# Patient Record
Sex: Male | Born: 1960 | Race: White | Hispanic: No | Marital: Single | Smoking: Never smoker
Health system: Southern US, Community
[De-identification: ages and names within clinical notes are randomized; demographics above are authoritative.]

## PROBLEM LIST (undated history)

## (undated) DIAGNOSIS — R569 Unspecified convulsions: Secondary | ICD-10-CM

## (undated) DIAGNOSIS — I10 Essential (primary) hypertension: Secondary | ICD-10-CM

## (undated) DIAGNOSIS — F849 Pervasive developmental disorder, unspecified: Secondary | ICD-10-CM

## (undated) DIAGNOSIS — D472 Monoclonal gammopathy: Secondary | ICD-10-CM

## (undated) DIAGNOSIS — I495 Sick sinus syndrome: Secondary | ICD-10-CM

## (undated) DIAGNOSIS — F32A Depression, unspecified: Secondary | ICD-10-CM

## (undated) DIAGNOSIS — F329 Major depressive disorder, single episode, unspecified: Secondary | ICD-10-CM

## (undated) HISTORY — PX: PACEMAKER IMPLANT: EP1218

---
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
## 2010-02-25 DIAGNOSIS — T883XXA Malignant hyperthermia due to anesthesia, initial encounter: Secondary | ICD-10-CM | POA: Insufficient documentation

## 2010-02-25 DIAGNOSIS — E6609 Other obesity due to excess calories: Secondary | ICD-10-CM | POA: Insufficient documentation

## 2010-03-07 DIAGNOSIS — F429 Obsessive-compulsive disorder, unspecified: Secondary | ICD-10-CM | POA: Insufficient documentation

## 2010-03-21 DIAGNOSIS — F849 Pervasive developmental disorder, unspecified: Secondary | ICD-10-CM | POA: Insufficient documentation

## 2010-03-21 DIAGNOSIS — F70 Mild intellectual disabilities: Secondary | ICD-10-CM | POA: Insufficient documentation

## 2010-03-21 DIAGNOSIS — F323 Major depressive disorder, single episode, severe with psychotic features: Secondary | ICD-10-CM | POA: Insufficient documentation

## 2010-05-27 DIAGNOSIS — L570 Actinic keratosis: Secondary | ICD-10-CM | POA: Insufficient documentation

## 2012-09-10 ENCOUNTER — Emergency Department: Payer: Self-pay | Admitting: Emergency Medicine

## 2012-09-20 ENCOUNTER — Emergency Department: Payer: Self-pay | Admitting: Emergency Medicine

## 2013-04-17 DIAGNOSIS — R269 Unspecified abnormalities of gait and mobility: Secondary | ICD-10-CM | POA: Insufficient documentation

## 2013-04-17 DIAGNOSIS — G252 Other specified forms of tremor: Secondary | ICD-10-CM | POA: Insufficient documentation

## 2013-04-17 DIAGNOSIS — K649 Unspecified hemorrhoids: Secondary | ICD-10-CM | POA: Insufficient documentation

## 2013-05-09 DIAGNOSIS — F980 Enuresis not due to a substance or known physiological condition: Secondary | ICD-10-CM | POA: Insufficient documentation

## 2013-07-09 DIAGNOSIS — K635 Polyp of colon: Secondary | ICD-10-CM | POA: Insufficient documentation

## 2013-07-22 DIAGNOSIS — Z85828 Personal history of other malignant neoplasm of skin: Secondary | ICD-10-CM | POA: Insufficient documentation

## 2013-10-24 ENCOUNTER — Observation Stay: Payer: Self-pay | Admitting: Internal Medicine

## 2013-10-24 LAB — CBC
HCT: 34.3 % — AB (ref 40.0–52.0)
HGB: 12 g/dL — ABNORMAL LOW (ref 13.0–18.0)
MCH: 33.8 pg (ref 26.0–34.0)
MCHC: 35 g/dL (ref 32.0–36.0)
MCV: 97 fL (ref 80–100)
PLATELETS: 118 10*3/uL — AB (ref 150–440)
RBC: 3.55 10*6/uL — ABNORMAL LOW (ref 4.40–5.90)
RDW: 14.4 % (ref 11.5–14.5)
WBC: 12.6 10*3/uL — AB (ref 3.8–10.6)

## 2013-10-24 LAB — PROTIME-INR
INR: 1
Prothrombin Time: 13.3 secs (ref 11.5–14.7)

## 2013-10-24 LAB — URINALYSIS, COMPLETE
Bacteria: NONE SEEN
Bilirubin,UR: NEGATIVE
Blood: NEGATIVE
Glucose,UR: 150 mg/dL (ref 0–75)
KETONE: NEGATIVE
Leukocyte Esterase: NEGATIVE
Nitrite: NEGATIVE
PH: 7 (ref 4.5–8.0)
Protein: NEGATIVE
SQUAMOUS EPITHELIAL: NONE SEEN
Specific Gravity: 1.006 (ref 1.003–1.030)

## 2013-10-24 LAB — COMPREHENSIVE METABOLIC PANEL
ALT: 13 U/L — AB
AST: 20 U/L (ref 15–37)
Albumin: 3.2 g/dL — ABNORMAL LOW (ref 3.4–5.0)
Alkaline Phosphatase: 97 U/L
Anion Gap: 11 (ref 7–16)
BUN: 16 mg/dL (ref 7–18)
Bilirubin,Total: 0.4 mg/dL (ref 0.2–1.0)
CHLORIDE: 87 mmol/L — AB (ref 98–107)
CO2: 25 mmol/L (ref 21–32)
Calcium, Total: 9.1 mg/dL (ref 8.5–10.1)
Creatinine: 0.7 mg/dL (ref 0.60–1.30)
EGFR (Non-African Amer.): >60
GLUCOSE: 84 mg/dL (ref 65–99)
OSMOLALITY: 248 (ref 275–301)
POTASSIUM: 3.9 mmol/L (ref 3.5–5.1)
SODIUM: 123 mmol/L — AB (ref 136–145)
Total Protein: 6.9 g/dL (ref 6.4–8.2)

## 2013-10-25 LAB — BASIC METABOLIC PANEL
ANION GAP: 10 (ref 7–16)
BUN: 17 mg/dL (ref 7–18)
CALCIUM: 8.6 mg/dL (ref 8.5–10.1)
Chloride: 102 mmol/L (ref 98–107)
Co2: 26 mmol/L (ref 21–32)
Creatinine: 0.63 mg/dL (ref 0.60–1.30)
EGFR (African American): >60
EGFR (Non-African Amer.): >60
GLUCOSE: 72 mg/dL (ref 65–99)
OSMOLALITY: 276 (ref 275–301)
Potassium: 3.9 mmol/L (ref 3.5–5.1)
Sodium: 138 mmol/L (ref 136–145)

## 2013-10-25 LAB — OSMOLALITY, URINE: Osmolality: 196 mOsm/kg

## 2013-10-25 LAB — CREATININE, URINE, RANDOM: Creatinine, Urine Random: 22.7 mg/dL — ABNORMAL LOW (ref 30.0–125.0)

## 2013-10-25 LAB — SODIUM, URINE, RANDOM: SODIUM, URINE RANDOM: 65 mmol/L (ref 20–110)

## 2014-04-24 DIAGNOSIS — Z5181 Encounter for therapeutic drug level monitoring: Secondary | ICD-10-CM | POA: Insufficient documentation

## 2014-04-24 DIAGNOSIS — R251 Tremor, unspecified: Secondary | ICD-10-CM | POA: Insufficient documentation

## 2014-04-24 DIAGNOSIS — R2681 Unsteadiness on feet: Secondary | ICD-10-CM | POA: Insufficient documentation

## 2014-05-30 NOTE — H&P (Signed)
PATIENT NAME:  Frank Conley, Frank Conley MR#:  [id number] DATE OF BIRTH:  1960-09-11  DATE OF ADMISSION:  10/24/2013  CHIEF COMPLAINT: Fall and weakness.   HISTORY OF PRESENT ILLNESS:  This 54 year old Caucasian male patient with history of seizures, OCD, questionable Parkinson's, and schizoaffective disorder presents to the Emergency Room, brought in by his group home caretaker after initially patient fell yesterday.  Today he got diaphoretic and almost passed out and was brought to the Emergency Room.  The patient is a poor historian and unable to give me much history but states that he does not have any pain and he does not remember when or how he fell.  But, per history, the patient did not have any syncope.  He did not hit his head.  CT of the head has been done today and is normal.   History has been obtained from my conversation with his brother who is his guardian, ER staff and records.   His brother mentions that the patient has had gait abnormalities and some weakness over the past 2 months and has had increasing physical therapy.  He follows with neurology in [facility], although schizoaffective disorder and Parkinson's disease were suspected, brother states that these are not possible.   PAST MEDICAL HISTORY:  1.  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  2.  Hyponatremia of unknown baseline.  3.  Seizures.  4.  History of syncope.  5.  OCD.  6.  Questionable schizoaffective disorder and Parkinson's disease.  7.  Dementia.   FAMILY HISTORY: Parkinson's disease.   SOCIAL HISTORY: The patient does not smoke. No alcohol. No illicit drugs. Lives at a group home.   ALLERGIES: No known drug allergies.   REVIEW OF SYSTEMS:  Unobtainable from patient secondary to his baseline confusion.   HOME MEDICATIONS:  1.  Amantadine 100 mg oral 3 times a day. 2.  Claritin 10 mg oral once a day. 3.  Depakote 500 mg oral once a day.  4.   Depakote 500 mg 2 tablets once a day at bedtime and 1 tab in AM 5.   Ferrous sulfate 325 mg daily.  6.  Klonopin 1 mg oral 2 times a day 7:00 a.m. and 2:00 p.m.  7.  Melatonin 3 mg oral once a day.  8.  MiraLax 1 dose daily.  9.  NAC 600 mg 2 capsules once a day in the morning and 1 capsule at night.  10. Norvasc 5 mg daily.  11. Omega-3 fatty acids 1 capsule daily.  12. Sertraline 100 mg 2.5 tablets daily.   PHYSICAL EXAMINATION:  VITAL SIGNS: Temperature of 98, pulse 72, blood pressure 130/83, saturating 100% on room air.  GENERAL: Moderately built Caucasian male patient lying in bed, seems comfortable.   PSYCHIATRIC: Alert, awake, has a flat affect.  HEENT: Atraumatic, normocephalic. Oral mucosa moist and pink. External ears normal. Pallor positive. No icterus. Pupils are equal and reactive to light.  NECK: Supple. No thyromegaly or palpable lymph nodes. Trachea midline. No carotid bruit, JVD.  CARDIOVASCULAR: S1, S2, without any murmurs. Peripheral pulses 2+. No edema.  RESPIRATORY: Normal work of breathing. Clear to auscultation on both sides.  GASTROINTESTINAL: Soft abdomen, nontender. Bowel sounds present. No organomegaly palpable.  SKIN: Warm and dry. No petechiae, rash, ulcers found.  MUSCULOSKELETAL: No joint swelling, redness in large joints.  Muscle tone is increased with cogwheel rigidity.  NEUROLOGICAL: Motor strength is significantly decreased in his lower extremities. Normal in upper extremities. Has hyperreflexia.  LABORATORY DATA: Glucose of 84, BUN 16, creatinine 0.7, with sodium 123, potassium 3.9, chloride 87, bicarbonate 25, GFR greater than 60. AST, ALT, alkaline phosphatase, bilirubin normal.   WBC 12.8, hemoglobin 12, platelets of 118,000 with INR of 1.   CT scan of the head without contrast shows nothing acute.   ASSESSMENT AND PLAN:  1.  Recurrent falls in a patient with possible Parkinson's disease and prior history of syncope.  I suspect the patient has had worsening disease, although he does not have a diagnosis of  Parkinson's.  He has some cogwheel rigidity, but does not have the classic pill-rolling tremor and gait not tested at this point. We will consult neurology for further recommendations regarding this, along with his seizure medications as the Depakote could be causing his hyponatremia and changing this medication to a different antiseizure medication seems to be appropriate with his significantly low sodium levels at this point.  The patient does seem to have chronic hyponatremia per his family, although his baseline numbers are unknown.  2.  Obsessive compulsive disorder. Continue medications.  3.  Hypertension, although not mentioned in his history, the patient is on Norvasc.  We will continue this and monitor closely.  4.  Deep vein thrombosis prophylaxis with Lovenox.   CODE STATUS: FULL CODE.   TIME SPENT TODAY ON THIS CASE:  40 minutes.     ____________________________ Leia Alf Ayano Douthitt, MD srs:DT D: 10/24/2013 15:24:15 ET T: 10/24/2013 15:50:42 ET JOB#: [id number]  cc: Alveta Heimlich R. Abigail Teall, MD, <Dictator> Neita Carp MD ELECTRONICALLY SIGNED 10/29/2013 15:59

## 2014-05-30 NOTE — Discharge Summary (Signed)
Dates of Admission and Diagnosis:  Date of Admission 24-Oct-2013   Date of Discharge 26-Oct-2013   Admitting Diagnosis hyponatremia   Final Diagnosis recurrent falls, Hyponatremia Htn Seizure disorder    Chief Complaint/History of Present Illness 54 year old Caucasian male patient with history of seizures, OCD, questionable Parkinson's, and schizoaffective disorder presents to the Emergency Room, brought in by his group home caretaker after initially patient fell yesterday.  Today he got diaphoretic and almost passed out and was brought to the Emergency Room.  The patient is a poor historian and unable to give me much history but states that he does not have any pain and he does not remember when or how he fell.  But, per history, the patient did not have any syncope.  He did not hit his head.  CT of the head has been done today and is normal.   History has been obtained from my conversation with his brother who is his guardian, ER staff and records.   His brother mentions that the patient has had gait abnormalities and some weakness over the past 2 months and has had increasing physical therapy.  He follows with neurology in [facility], although schizoaffective disorder and Parkinson's disease were suspected, brother states that these are not possible.   Allergies:  No Known Allergies:   Hepatic:  18-Sep-15 13:25   Bilirubin, Total 0.4  Alkaline Phosphatase 97 (46-116 NOTE: New Reference Range 08/26/13)  SGPT (ALT)  13 (14-63 NOTE: New Reference Range 08/26/13)  SGOT (AST) 20  Total Protein, Serum 6.9  Albumin, Serum  3.2  Routine Chem:  18-Sep-15 13:25   Glucose, Serum 84  BUN 16  Creatinine (comp) 0.70  Sodium, Serum  123  Potassium, Serum 3.9  Chloride, Serum  87  CO2, Serum 25  Calcium (Total), Serum 9.1  Anion Gap 11  Osmolality (calc) 248  eGFR (African American) >60  eGFR (Non-African American) >60 (eGFR values <20mL/min/1.73 m2 may be an indication of  chronic kidney disease (CKD). Calculated eGFR is useful in patients with stable renal function. The eGFR calculation will not be reliable in acutely ill patients when serum creatinine is changing rapidly. It is not useful in  patients on dialysis. The eGFR calculation may not be applicable to patients at the low and high extremes of body sizes, pregnant women, and vegetarians.)  19-Sep-15 04:12   Glucose, Serum 72  BUN 17  Creatinine (comp) 0.63  Sodium, Serum 138  Potassium, Serum 3.9  Chloride, Serum 102  CO2, Serum 26  Calcium (Total), Serum 8.6  Anion Gap 10  Osmolality (calc) 276  eGFR (African American) >60  eGFR (Non-African American) >60 (eGFR values <75mL/min/1.73 m2 may be an indication of chronic kidney disease (CKD). Calculated eGFR is useful in patients with stable renal function. The eGFR calculation will not be reliable in acutely ill patients when serum creatinine is changing rapidly. It is not useful in  patients on dialysis. The eGFR calculation may not be applicable to patients at the low and high extremes of body sizes, pregnant women, and vegetarians.)  Misc Urine Chem:  18-Sep-15 20:06   Sodium, Urine Random 65 (Result(s) reported on 24 Oct 2013 at 11:58PM.)  Creatinine, Urine Random  22.7 (Result(s) reported on 25 Oct 2013 at 01:18AM.)  Osmolality, Urine Random 196 (50-1400 300-900 mOsm/kg   with avg Fluid Intake > 850 mOsm/kg  with Fluid Restriction)  Routine UA:  18-Sep-15 20:06   Color (UA) Straw  Clarity (UA) Clear  Glucose (  UA) 150 mg/dL  Bilirubin (UA) Negative  Ketones (UA) Negative  Specific Gravity (UA) 1.006  Blood (UA) Negative  pH (UA) 7.0  Protein (UA) Negative  Nitrite (UA) Negative  Leukocyte Esterase (UA) Negative (Result(s) reported on 24 Oct 2013 at 11:58PM.)  RBC (UA) <1 /HPF  WBC (UA) <1 /HPF  Bacteria (UA) NONE SEEN  Epithelial Cells (UA) NONE SEEN  Mucous (UA) PRESENT (Result(s) reported on 24 Oct 2013 at 11:58PM.)   Routine Coag:  18-Sep-15 13:25   Prothrombin 13.3  INR 1.0 (INR reference interval applies to patients on anticoagulant therapy. A single INR therapeutic range for coumarins is not optimal for all indications; however, the suggested range for most indications is 2.0 - 3.0. Exceptions to the INR Reference Range may include: Prosthetic heart valves, acute myocardial infarction, prevention of myocardial infarction, and combinations of aspirin and anticoagulant. The need for a higher or lower target INR must be assessed individually. Reference: The Pharmacology and Management of the Vitamin K  antagonists: the seventh ACCP Conference on Antithrombotic and Thrombolytic Therapy. JKDTO.6712 Sept:126 (3suppl): [id number]. A HCT value >55% may artifactually increase the PT.  In one study,  the increase was an average of 25%. Reference:  "Effect on Routine and Special Coagulation Testing Values of Citrate Anticoagulant Adjustment in Patients with High HCT Values." American Journal of Clinical Pathology 2006;126:400-405.)  Routine Hem:  18-Sep-15 13:25   WBC (CBC)  12.6  RBC (CBC)  3.55  Hemoglobin (CBC)  12.0  Hematocrit (CBC)  34.3  Platelet Count (CBC)  118 (Result(s) reported on 24 Oct 2013 at 01:57PM.)  MCV 97  MCH 33.8  MCHC 35.0  RDW 14.4   PERTINENT RADIOLOGY STUDIES: CT:    18-Sep-15 12:45, CT Head Without Contrast  CT Head Without Contrast   REASON FOR EXAM:    h a after imjury  COMMENTS:       PROCEDURE: CT  - CT HEAD WITHOUT CONTRAST  - Oct 24 2013 12:45PM     CLINICAL DATA:  Fall 24 hr ago now with altered mental status    EXAM:  CT HEAD WITHOUT CONTRAST    TECHNIQUE:  Contiguous axial images were obtained from the base of the skull  through the vertex without intravenous contrast.    COMPARISON:  Prior head CT 09/10/2012  FINDINGS:  Negative for acute intracranial hemorrhage, acute infarction, mass,  mass effect, hydrocephalus ormidline shift.  Gray-white  differentiation is preserved throughout. No acute soft tissue or  calvarial abnormality. The globes and orbits are symmetric and  unremarkable. Normal aeration of the mastoid air cells and  visualized paranasal sinuses.     IMPRESSION:  Negative head CT.      Electronically Signed    By: Jacqulynn Cadet M.D.    On: 10/24/2013 13:08     Verified By: Criselda Peaches, M.D.,   Pertinent Past History:  Pertinent Past History 1.  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  2.  Hyponatremia of unknown baseline.  3.  Seizures.  4.  History of syncope.  5.  OCD.  6.  Questionable schizoaffective disorder and Parkinson's disease.  7.  Dementia.   Hospital Course:  Hospital Course 1.  Recurrent falls - with hyponatremia in a patient with possible Parkinson's disease and prior history of syncope.    consulted neurology for further recommendations regarding this, along with his seizure medications as the Depakote could be causing his hyponatremia    sodium level improved today.   Seen by  neurologist- suggest to continue Depakote- as it seems more for psych issues, and follow with psych as out pt. 2.  Obsessive compulsive disorder. Continue medications.  3.  Hypertension,  on Norvasc.  We will continue this and monitor closely.  4.  Deep vein thrombosis prophylaxis with Lovenox. discharge back to group home today.   Condition on Discharge Stable   Code Status:  Code Status Full Code   DISCHARGE INSTRUCTIONS HOME MEDS:  Medication Reconciliation: Patient's Home Medications at Discharge:     Medication Instructions  depakote 500 mg oral delayed release tablet  1 tab(s) orally once a day   depakote 500 mg oral tablet, extended release  2 tab(s) orally once a day (at bedtime)   sertraline 100 mg oral tablet  2.5 tab(s) orally once a day   nac 600 mg oral capsule  1 cap(s) orally once a day (at bedtime)   melatonin 3 mg oral tablet  1 tab(s) orally once (at bedtime)    klonopin 1 mg oral tablet  1 tab(s) orally 2 times a day at 7am and 2pm and bedtime    ferrous sulfate 325 mg oral tablet  1 tab(s) orally once a day   norvasc 5 mg oral tablet  1 tab(s) orally once a day   claritin 10 mg oral tablet  1 tab(s) orally once a day   amantadine 100 mg oral capsule  1 cap(s) orally 3 times a day   omega essentials  1 cap(s) orally once a day   miralax - oral powder for reconstitution  1 dose(s) orally once a day   nac 600 mg oral capsule  2 cap(s) orally once a day    PRESCRIPTIONS: ELECTRONICALLY SUBMITTED   Physician's Instructions:  Diet Low Sodium   Activity Limitations As tolerated   Return to Work Not Applicable   Time frame for Follow Up Appointment 1-2 weeks  psychiatry   Other Comments Follow with Psychiatry in office in 2 weeks , may need to change medicines due to hyponatremia.   Electronic Signatures: Vaughan Basta (MD)  (Signed 20-Sep-15 14:04)  Authored: ADMISSION DATE AND DIAGNOSIS, CHIEF COMPLAINT/HPI, Allergies, PERTINENT LABS, PERTINENT RADIOLOGY STUDIES, PERTINENT PAST HISTORY, HOSPITAL COURSE, DISCHARGE INSTRUCTIONS HOME MEDS, PATIENT INSTRUCTIONS   Last Updated: 20-Sep-15 14:04 by Vaughan Basta (MD)

## 2014-05-30 NOTE — Consult Note (Signed)
PATIENT NAME:  Frank Conley, Frank Conley MR#:  [id number] DATE OF BIRTH:  Oct 02, 1960  DATE OF CONSULTATION:  10/25/2013  CONSULTING PHYSICIAN:  Leotis Pain, MD  REASON FOR CONSULTATION: Questionable seizure activity.   HISTORY OF PRESENT ILLNESS: This is a 54 year old gentleman with a past medical history of seizures, OCD, questionable Parkinson's disease and schizoaffective disorder, who presented to the emergency department with suspected fall. Prior to the fall, the patient was diaphoretic. There was near loss of consciousness. The patient has been declining in terms of his motor strength and motor ability in the past couple of months. At baseline, the patient takes Depakote for the suspected seizure activity, the patient could not tell me when the last seizure occurred, as well as for schizoaffective disorder. The patient was found to be hyponatremic on admission, current sodium is 138 and much improved.   PAST MEDICAL HISTORY: Hyponatremia, seizure disorder, history of syncope, OCD, schizoaffective disorder, history of dementia.   HOME MEDICATIONS: Include amantadine, Claritin, Depakote 500 in the morning and 1000 mg at night, iron, Klonopin, melatonin, MiraLax, Norvasc, omega and sertraline.   PHYSICAL EXAMINATION: VITAL SIGNS: Include a temperature of 98.2, pulse 76, respirations 18, blood pressure 146/81.  NEUROLOGIC: The patient is able to tell me he is in the hospital and his name. Could not tell me the date or the name of the hospital. Extraocular movements intact. Facial sensation intact. Facial motor is intact. Extraocular movements intact. Motor strength is symmetrical bilaterally. No sensory deficits. Reflexes diminished. Gait could not be assessed.   IMPRESSION: A 54 year old, Caucasian male with seizure disorder obsessive-compulsive disorder, questionable Parkinson and schizoaffective disorder, presenting status post fall. Questionable seizure disorder, found to be hyponatremic that has a  much improved as of yesterday. The patient is probably close to his baseline.   PLAN: I would not change his Depakote dosing, the reason being it probably controls his schizoaffective disorder. I suspect at the current time the patient is at baseline. He follows commands. His current sodium is 138. Would have psychiatry follow up as an outpatient. If they do not think he needs Depakote for his schizoaffective disorder, would gladly switch it to another agent, but it appears that Depakote is controlling his seizures as he could not tell me the last real true seizure. No further imaging needed at this point. Safe for discharge from a neurological standpoint.   Thank you. It was a pleasure seeing this patient.     ____________________________ Leotis Pain, MD yz:TT D: 10/25/2013 15:39:18 ET T: 10/25/2013 15:51:43 ET JOB#: [id number]  cc: Leotis Pain, MD, <Dictator> Leotis Pain MD ELECTRONICALLY SIGNED 11/10/2013 14:24

## 2014-08-29 ENCOUNTER — Emergency Department
Admission: EM | Admit: 2014-08-29 | Discharge: 2014-08-29 | Discharge status: Against Medical Advice | Payer: Medicare Other | Attending: Emergency Medicine | Admitting: Emergency Medicine

## 2014-08-29 DIAGNOSIS — Y998 Other external cause status: Secondary | ICD-10-CM | POA: Diagnosis not present

## 2014-08-29 DIAGNOSIS — S0990XA Unspecified injury of head, initial encounter: Secondary | ICD-10-CM | POA: Insufficient documentation

## 2014-08-29 DIAGNOSIS — W01198A Fall on same level from slipping, tripping and stumbling with subsequent striking against other object, initial encounter: Secondary | ICD-10-CM | POA: Diagnosis not present

## 2014-08-29 DIAGNOSIS — Y9289 Other specified places as the place of occurrence of the external cause: Secondary | ICD-10-CM | POA: Diagnosis not present

## 2014-08-29 DIAGNOSIS — Y9389 Activity, other specified: Secondary | ICD-10-CM | POA: Insufficient documentation

## 2014-08-29 NOTE — ED Notes (Signed)
Pt to ED from a group home with staff c/o that pt fell and struck his head on a carpeted floor, denies any LOC.

## 2014-10-08 DIAGNOSIS — D696 Thrombocytopenia, unspecified: Secondary | ICD-10-CM | POA: Insufficient documentation

## 2014-10-30 DIAGNOSIS — R4182 Altered mental status, unspecified: Secondary | ICD-10-CM | POA: Insufficient documentation

## 2014-10-31 DIAGNOSIS — R471 Dysarthria and anarthria: Secondary | ICD-10-CM | POA: Insufficient documentation

## 2014-11-06 DIAGNOSIS — Z950 Presence of cardiac pacemaker: Secondary | ICD-10-CM | POA: Insufficient documentation

## 2014-11-06 DIAGNOSIS — A419 Sepsis, unspecified organism: Secondary | ICD-10-CM | POA: Insufficient documentation

## 2015-02-10 DIAGNOSIS — Z9181 History of falling: Secondary | ICD-10-CM | POA: Insufficient documentation

## 2016-06-29 DIAGNOSIS — B351 Tinea unguium: Secondary | ICD-10-CM | POA: Insufficient documentation

## 2017-07-18 DIAGNOSIS — H5213 Myopia, bilateral: Secondary | ICD-10-CM | POA: Insufficient documentation

## 2017-07-18 DIAGNOSIS — H353131 Nonexudative age-related macular degeneration, bilateral, early dry stage: Secondary | ICD-10-CM | POA: Insufficient documentation

## 2017-07-18 DIAGNOSIS — H2513 Age-related nuclear cataract, bilateral: Secondary | ICD-10-CM | POA: Insufficient documentation

## 2018-10-12 ENCOUNTER — Emergency Department
Admission: EM | Admit: 2018-10-12 | Discharge: 2018-10-12 | Disposition: A | Discharge status: Home / Self Care | Payer: Medicare Other | Attending: Emergency Medicine | Admitting: Emergency Medicine

## 2018-10-12 ENCOUNTER — Other Ambulatory Visit: Payer: Self-pay

## 2018-10-12 DIAGNOSIS — R569 Unspecified convulsions: Secondary | ICD-10-CM | POA: Diagnosis present

## 2018-10-12 HISTORY — DX: Unspecified convulsions: R56.9

## 2018-10-12 LAB — COMPREHENSIVE METABOLIC PANEL
ALT: 14 U/L (ref 0–44)
AST: 24 U/L (ref 15–41)
Albumin: 4 g/dL (ref 3.5–5.0)
Alkaline Phosphatase: 87 U/L (ref 38–126)
Anion gap: 14 (ref 5–15)
BUN: 20 mg/dL (ref 6–20)
CO2: 23 mmol/L (ref 22–32)
Calcium: 9.4 mg/dL (ref 8.9–10.3)
Chloride: 104 mmol/L (ref 98–111)
Creatinine, Ser: 1.14 mg/dL (ref 0.61–1.24)
GFR calc Af Amer: >60 mL/min (ref >60)
GFR calc non Af Amer: >60 mL/min (ref >60)
Glucose, Bld: 97 mg/dL (ref 70–99)
Potassium: 3.9 mmol/L (ref 3.5–5.1)
Sodium: 141 mmol/L (ref 135–145)
Total Bilirubin: 0.4 mg/dL (ref 0.3–1.2)
Total Protein: 6.9 g/dL (ref 6.5–8.1)

## 2018-10-12 LAB — CBC WITH DIFFERENTIAL/PLATELET
Abs Immature Granulocytes: 0.01 10*3/uL (ref 0.00–0.07)
Basophils Absolute: 0 10*3/uL (ref 0.0–0.1)
Basophils Relative: 1 %
Eosinophils Absolute: 0.1 10*3/uL (ref 0.0–0.5)
Eosinophils Relative: 1 %
HCT: 35 % — ABNORMAL LOW (ref 39.0–52.0)
Hemoglobin: 12 g/dL — ABNORMAL LOW (ref 13.0–17.0)
Immature Granulocytes: 0 %
Lymphocytes Relative: 19 %
Lymphs Abs: 1 10*3/uL (ref 0.7–4.0)
MCH: 32.4 pg (ref 26.0–34.0)
MCHC: 34.3 g/dL (ref 30.0–36.0)
MCV: 94.6 fL (ref 80.0–100.0)
Monocytes Absolute: 0.3 10*3/uL (ref 0.1–1.0)
Monocytes Relative: 6 %
Neutro Abs: 3.8 10*3/uL (ref 1.7–7.7)
Neutrophils Relative %: 73 %
Platelets: 157 10*3/uL (ref 150–400)
RBC: 3.7 MIL/uL — ABNORMAL LOW (ref 4.22–5.81)
RDW: 13.5 % (ref 11.5–15.5)
WBC: 5.2 10*3/uL (ref 4.0–10.5)
nRBC: 0 % (ref 0.0–0.2)

## 2018-10-12 MED ORDER — CLONAZEPAM 0.5 MG PO TBDP
0.5000 mg | ORAL_TABLET | Freq: Two times a day (BID) | ORAL | Status: DC
  Administered 2018-10-12: 22:00:00 0.5 mg via ORAL
  Filled 2018-10-12: qty 1

## 2018-10-12 MED ORDER — LORAZEPAM 2 MG/ML IJ SOLN: 2.0000 mg | Freq: Once | INTRAMUSCULAR | Status: DC

## 2018-10-12 MED ORDER — CLONAZEPAM 0.5 MG PO TABS: 0.5000 mg | ORAL_TABLET | Freq: Three times a day (TID) | ORAL | 0 refills | Status: DC

## 2018-10-12 NOTE — ED Notes (Signed)
Spoke with patient's brother Simona Huh [phone]) who is patient's legal guardian and Pamala Hurry [phone]) from group home and informed them that patient is ready to be discharged.  Both verbalized understanding.  Pamala Hurry let this nurse know it would be around 9:45 pm and she would be here to pick up the patient.

## 2018-10-12 NOTE — ED Notes (Signed)
Spoke with patient's guardian who wanted to let us know that they would not be able to get a prescription for him until as least Tuesday as [facility] would not be back in the office until then.  Dr. Archie Balboa notified.

## 2018-10-12 NOTE — ED Provider Notes (Signed)
[facility] Emergency Department Provider Note   ____________________________________________   I have reviewed the triage vital signs and the nursing notes.   HISTORY  Chief Complaint Seizures   History limited by: Not Limited   HPI Frank Conley is a 58 y.o. male who presents to the emergency department today after apparent seizure. The patient states that he was feeling fine earlier in the day. Apparently has been out of his klonipin for the past few days. Is not aware if he has been out of his lamictal but no report that he has been. At the time of my exam he denies any headache or tongue pain. Denies any recent illness, fevers, nausea or vomiting.    Records reviewed. Per medical record review patient has a history of seizures, followed by [facility] neurology. On lamictal for seizures.  Past Medical History:  Diagnosis Date  . Seizures ([place])     There are no active problems to display for this patient.   Prior to Admission medications   Not on File    Allergies Dilantin [phenytoin], Keppra [levetiracetam], and Lisinopril  No family history on file.  Social History Social History   Tobacco Use  . Smoking status: Not on file  Substance Use Topics  . Alcohol use: Not on file  . Drug use: Not on file    Review of Systems Constitutional: No fever/chills Eyes: No visual changes. ENT: No sore throat. Cardiovascular: Denies chest pain. Respiratory: Denies shortness of breath. Gastrointestinal: No abdominal pain.  No nausea, no vomiting.  No diarrhea.   Genitourinary: Negative for dysuria. Musculoskeletal: Negative for back pain. Skin: Negative for rash. Neurological: Positive for seizure.  ____________________________________________   PHYSICAL EXAM:  VITAL SIGNS: ED Triage Vitals  Enc Vitals Group     BP --      Pulse --      Resp --      Temp --      Temp src --      SpO2 10/12/18 1752 95 %     Weight 10/12/18 1759 160 lb  (72.6 kg)     Height 10/12/18 1759 5\' 11"  (1.803 m)     Head Circumference --      Peak Flow --      Pain Score 10/12/18 1758 0    Constitutional: Alert and oriented.  Eyes: Conjunctivae are normal.  ENT      Head: Normocephalic and atraumatic.      Nose: No congestion/rhinnorhea.      Mouth/Throat: Mucous membranes are moist.      Neck: No stridor. Hematological/Lymphatic/Immunilogical: No cervical lymphadenopathy. Cardiovascular: Normal rate, regular rhythm.  No murmurs, rubs, or gallops.  Respiratory: Normal respiratory effort without tachypnea nor retractions. Breath sounds are clear and equal bilaterally. No wheezes/rales/rhonchi. Gastrointestinal: Soft and non tender. No rebound. No guarding.  Genitourinary: Deferred Musculoskeletal: Normal range of motion in all extremities. No lower extremity edema. Neurologic:  Normal speech and language. No gross focal neurologic deficits are appreciated.  Skin:  Skin is warm, dry and intact. No rash noted. Psychiatric: Mood and affect are normal. Speech and behavior are normal. Patient exhibits appropriate insight and judgment.  ____________________________________________    LABS (pertinent positives/negatives)  CMP wnl CBC wbc 5.2, hgb 12.0, plt 157  ____________________________________________   EKG  None  ____________________________________________    RADIOLOGY  None  ____________________________________________   PROCEDURES  Procedures  ____________________________________________   INITIAL IMPRESSION / ASSESSMENT AND PLAN / ED COURSE  Pertinent  labs & imaging results that were available during my care of the patient were reviewed by me and considered in my medical decision making (see chart for details).   Patient presented to the emergency department today after apparent seizure.  Patient has a history of seizure disorder is been out of a benzodiazepine for the past few days.  I do think likely could  explain the patient's seizure and lowered the patient's seizure threshold.  Blood work without any concerning electrolyte abnormalities.  Patient was given 1 benzodiazepine here without any further seizure activity.  Will give patient prescription for benzodiazepine.   ____________________________________________   FINAL CLINICAL IMPRESSION(S) / ED DIAGNOSES  Final diagnoses:  Seizure ([place])     Note: This dictation was prepared with Dragon dictation. Any transcriptional errors that result from this process are unintentional     Nance Pear, MD 10/12/18 2118

## 2018-10-12 NOTE — Discharge Instructions (Addendum)
Please seek medical attention for any high fevers, chest pain, shortness of breath, change in behavior, persistent vomiting, bloody stool or any other new or concerning symptoms.  

## 2018-10-12 NOTE — ED Triage Notes (Signed)
He arrives via [facility] from [facility] after experiencing a seizure today that lasted 2-3 minutes witnessed by staff  Pt has been with out his clonazepam for 3 days and has not experienced a seizure in 5 years   [place] at [facility] for colateral  [phone]

## 2018-12-19 ENCOUNTER — Inpatient Hospital Stay
Admission: EM | Admit: 2018-12-19 | Discharge: 2018-12-20 | DRG: 177 | Disposition: A | Discharge status: Other Acute Inpatient Hospital | Payer: Medicare Other | Attending: Internal Medicine | Admitting: Internal Medicine

## 2018-12-19 ENCOUNTER — Encounter: Payer: Self-pay | Admitting: Emergency Medicine

## 2018-12-19 ENCOUNTER — Emergency Department: Payer: Medicare Other

## 2018-12-19 DIAGNOSIS — R7989 Other specified abnormal findings of blood chemistry: Secondary | ICD-10-CM

## 2018-12-19 DIAGNOSIS — I10 Essential (primary) hypertension: Secondary | ICD-10-CM | POA: Diagnosis present

## 2018-12-19 DIAGNOSIS — D509 Iron deficiency anemia, unspecified: Secondary | ICD-10-CM | POA: Diagnosis present

## 2018-12-19 DIAGNOSIS — J1289 Other viral pneumonia: Secondary | ICD-10-CM | POA: Diagnosis not present

## 2018-12-19 DIAGNOSIS — F323 Major depressive disorder, single episode, severe with psychotic features: Secondary | ICD-10-CM | POA: Diagnosis present

## 2018-12-19 DIAGNOSIS — Z950 Presence of cardiac pacemaker: Secondary | ICD-10-CM

## 2018-12-19 DIAGNOSIS — J9601 Acute respiratory failure with hypoxia: Secondary | ICD-10-CM | POA: Diagnosis present

## 2018-12-19 DIAGNOSIS — R6250 Unspecified lack of expected normal physiological development in childhood: Secondary | ICD-10-CM

## 2018-12-19 DIAGNOSIS — F79 Unspecified intellectual disabilities: Secondary | ICD-10-CM | POA: Diagnosis present

## 2018-12-19 DIAGNOSIS — U071 COVID-19: Secondary | ICD-10-CM | POA: Diagnosis not present

## 2018-12-19 DIAGNOSIS — N179 Acute kidney failure, unspecified: Secondary | ICD-10-CM | POA: Diagnosis present

## 2018-12-19 DIAGNOSIS — E871 Hypo-osmolality and hyponatremia: Secondary | ICD-10-CM | POA: Diagnosis present

## 2018-12-19 DIAGNOSIS — F429 Obsessive-compulsive disorder, unspecified: Secondary | ICD-10-CM | POA: Diagnosis present

## 2018-12-19 DIAGNOSIS — F849 Pervasive developmental disorder, unspecified: Secondary | ICD-10-CM | POA: Diagnosis present

## 2018-12-19 DIAGNOSIS — N170 Acute kidney failure with tubular necrosis: Secondary | ICD-10-CM | POA: Diagnosis present

## 2018-12-19 DIAGNOSIS — J189 Pneumonia, unspecified organism: Secondary | ICD-10-CM

## 2018-12-19 DIAGNOSIS — Z659 Problem related to unspecified psychosocial circumstances: Secondary | ICD-10-CM

## 2018-12-19 DIAGNOSIS — IMO0001 Reserved for inherently not codable concepts without codable children: Secondary | ICD-10-CM

## 2018-12-19 DIAGNOSIS — Z79899 Other long term (current) drug therapy: Secondary | ICD-10-CM

## 2018-12-19 DIAGNOSIS — D61818 Other pancytopenia: Secondary | ICD-10-CM | POA: Diagnosis present

## 2018-12-19 DIAGNOSIS — J188 Other pneumonia, unspecified organism: Secondary | ICD-10-CM | POA: Diagnosis present

## 2018-12-19 DIAGNOSIS — N189 Chronic kidney disease, unspecified: Secondary | ICD-10-CM | POA: Diagnosis present

## 2018-12-19 DIAGNOSIS — E860 Dehydration: Secondary | ICD-10-CM | POA: Diagnosis present

## 2018-12-19 DIAGNOSIS — G40909 Epilepsy, unspecified, not intractable, without status epilepticus: Secondary | ICD-10-CM

## 2018-12-19 DIAGNOSIS — Z888 Allergy status to other drugs, medicaments and biological substances status: Secondary | ICD-10-CM

## 2018-12-19 HISTORY — DX: Depression, unspecified: F32.A

## 2018-12-19 HISTORY — DX: Essential (primary) hypertension: I10

## 2018-12-19 NOTE — ED Triage Notes (Signed)
Pt to triage via w/c with no distress noted, mask in place, brought in by EMS from group home; dx with URI yesterday; persistent fever and nonprod cough

## 2018-12-20 ENCOUNTER — Inpatient Hospital Stay: Payer: Medicare Other

## 2018-12-20 ENCOUNTER — Inpatient Hospital Stay (HOSPITAL_COMMUNITY)
Admission: AD | Admit: 2018-12-20 | Discharge: 2018-12-24 | DRG: 177 | Disposition: A | Discharge status: Home / Self Care | Payer: Medicare Other | Source: Ambulatory Visit | Attending: Internal Medicine | Admitting: Internal Medicine

## 2018-12-20 ENCOUNTER — Encounter (HOSPITAL_COMMUNITY): Payer: Self-pay | Admitting: Internal Medicine

## 2018-12-20 DIAGNOSIS — D61818 Other pancytopenia: Secondary | ICD-10-CM | POA: Diagnosis present

## 2018-12-20 DIAGNOSIS — Z659 Problem related to unspecified psychosocial circumstances: Secondary | ICD-10-CM | POA: Diagnosis not present

## 2018-12-20 DIAGNOSIS — N179 Acute kidney failure, unspecified: Secondary | ICD-10-CM | POA: Diagnosis present

## 2018-12-20 DIAGNOSIS — F849 Pervasive developmental disorder, unspecified: Secondary | ICD-10-CM | POA: Diagnosis present

## 2018-12-20 DIAGNOSIS — E871 Hypo-osmolality and hyponatremia: Secondary | ICD-10-CM | POA: Diagnosis present

## 2018-12-20 DIAGNOSIS — T380X5A Adverse effect of glucocorticoids and synthetic analogues, initial encounter: Secondary | ICD-10-CM | POA: Diagnosis present

## 2018-12-20 DIAGNOSIS — N170 Acute kidney failure with tubular necrosis: Secondary | ICD-10-CM | POA: Diagnosis present

## 2018-12-20 DIAGNOSIS — U071 COVID-19: Principal | ICD-10-CM | POA: Diagnosis present

## 2018-12-20 DIAGNOSIS — R6250 Unspecified lack of expected normal physiological development in childhood: Secondary | ICD-10-CM

## 2018-12-20 DIAGNOSIS — Z950 Presence of cardiac pacemaker: Secondary | ICD-10-CM | POA: Diagnosis not present

## 2018-12-20 DIAGNOSIS — I495 Sick sinus syndrome: Secondary | ICD-10-CM | POA: Diagnosis present

## 2018-12-20 DIAGNOSIS — Z66 Do not resuscitate: Secondary | ICD-10-CM | POA: Diagnosis present

## 2018-12-20 DIAGNOSIS — R0902 Hypoxemia: Secondary | ICD-10-CM

## 2018-12-20 DIAGNOSIS — G40909 Epilepsy, unspecified, not intractable, without status epilepticus: Secondary | ICD-10-CM

## 2018-12-20 DIAGNOSIS — R195 Other fecal abnormalities: Secondary | ICD-10-CM | POA: Diagnosis present

## 2018-12-20 DIAGNOSIS — D509 Iron deficiency anemia, unspecified: Secondary | ICD-10-CM | POA: Diagnosis present

## 2018-12-20 DIAGNOSIS — T454X5A Adverse effect of iron and its compounds, initial encounter: Secondary | ICD-10-CM | POA: Diagnosis present

## 2018-12-20 DIAGNOSIS — D649 Anemia, unspecified: Secondary | ICD-10-CM | POA: Diagnosis present

## 2018-12-20 DIAGNOSIS — Z79899 Other long term (current) drug therapy: Secondary | ICD-10-CM

## 2018-12-20 DIAGNOSIS — D472 Monoclonal gammopathy: Secondary | ICD-10-CM | POA: Diagnosis present

## 2018-12-20 DIAGNOSIS — IMO0001 Reserved for inherently not codable concepts without codable children: Secondary | ICD-10-CM

## 2018-12-20 DIAGNOSIS — R739 Hyperglycemia, unspecified: Secondary | ICD-10-CM | POA: Diagnosis present

## 2018-12-20 DIAGNOSIS — N183 Chronic kidney disease, stage 3 unspecified: Secondary | ICD-10-CM | POA: Diagnosis not present

## 2018-12-20 DIAGNOSIS — R1312 Dysphagia, oropharyngeal phase: Secondary | ICD-10-CM | POA: Diagnosis present

## 2018-12-20 DIAGNOSIS — J9601 Acute respiratory failure with hypoxia: Secondary | ICD-10-CM

## 2018-12-20 DIAGNOSIS — Z888 Allergy status to other drugs, medicaments and biological substances status: Secondary | ICD-10-CM | POA: Diagnosis not present

## 2018-12-20 DIAGNOSIS — I10 Essential (primary) hypertension: Secondary | ICD-10-CM | POA: Diagnosis present

## 2018-12-20 DIAGNOSIS — N189 Chronic kidney disease, unspecified: Secondary | ICD-10-CM | POA: Diagnosis present

## 2018-12-20 DIAGNOSIS — J1289 Other viral pneumonia: Secondary | ICD-10-CM | POA: Diagnosis present

## 2018-12-20 DIAGNOSIS — F429 Obsessive-compulsive disorder, unspecified: Secondary | ICD-10-CM | POA: Diagnosis present

## 2018-12-20 DIAGNOSIS — R1310 Dysphagia, unspecified: Secondary | ICD-10-CM | POA: Diagnosis present

## 2018-12-20 DIAGNOSIS — J189 Pneumonia, unspecified organism: Secondary | ICD-10-CM

## 2018-12-20 DIAGNOSIS — J690 Pneumonitis due to inhalation of food and vomit: Secondary | ICD-10-CM | POA: Diagnosis present

## 2018-12-20 DIAGNOSIS — R7402 Elevation of levels of lactic acid dehydrogenase (LDH): Secondary | ICD-10-CM | POA: Diagnosis present

## 2018-12-20 DIAGNOSIS — J188 Other pneumonia, unspecified organism: Secondary | ICD-10-CM | POA: Diagnosis present

## 2018-12-20 DIAGNOSIS — Z7952 Long term (current) use of systemic steroids: Secondary | ICD-10-CM | POA: Diagnosis not present

## 2018-12-20 DIAGNOSIS — F79 Unspecified intellectual disabilities: Secondary | ICD-10-CM | POA: Diagnosis present

## 2018-12-20 DIAGNOSIS — E860 Dehydration: Secondary | ICD-10-CM | POA: Diagnosis present

## 2018-12-20 DIAGNOSIS — F323 Major depressive disorder, single episode, severe with psychotic features: Secondary | ICD-10-CM | POA: Diagnosis present

## 2018-12-20 DIAGNOSIS — F329 Major depressive disorder, single episode, unspecified: Secondary | ICD-10-CM | POA: Diagnosis present

## 2018-12-20 DIAGNOSIS — J1282 Pneumonia due to coronavirus disease 2019: Secondary | ICD-10-CM | POA: Diagnosis present

## 2018-12-20 HISTORY — DX: Monoclonal gammopathy: D47.2

## 2018-12-20 HISTORY — DX: Sick sinus syndrome: I49.5

## 2018-12-20 HISTORY — DX: Pervasive developmental disorder, unspecified: F84.9

## 2018-12-20 LAB — FERRITIN: Ferritin: 334 ng/mL (ref 24–336)

## 2018-12-20 LAB — CBC
HCT: 29 % — ABNORMAL LOW (ref 39.0–52.0)
Hemoglobin: 10.2 g/dL — ABNORMAL LOW (ref 13.0–17.0)
MCH: 32 pg (ref 26.0–34.0)
MCHC: 35.2 g/dL (ref 30.0–36.0)
MCV: 90.9 fL (ref 80.0–100.0)
Platelets: 103 10*3/uL — ABNORMAL LOW (ref 150–400)
RBC: 3.19 MIL/uL — ABNORMAL LOW (ref 4.22–5.81)
RDW: 13.5 % (ref 11.5–15.5)
WBC: 4.7 10*3/uL (ref 4.0–10.5)
nRBC: 0 % (ref 0.0–0.2)

## 2018-12-20 LAB — URINALYSIS, COMPLETE (UACMP) WITH MICROSCOPIC
Bacteria, UA: NONE SEEN
Bilirubin Urine: NEGATIVE
Glucose, UA: NEGATIVE mg/dL
Hgb urine dipstick: NEGATIVE
Ketones, ur: 5 mg/dL — AB
Leukocytes,Ua: NEGATIVE
Nitrite: NEGATIVE
Protein, ur: 30 mg/dL — AB
Specific Gravity, Urine: 1.018 (ref 1.005–1.030)
Squamous Epithelial / HPF: NONE SEEN (ref 0–5)
pH: 6 (ref 5.0–8.0)

## 2018-12-20 LAB — LACTATE DEHYDROGENASE: LDH: 220 U/L — ABNORMAL HIGH (ref 98–192)

## 2018-12-20 LAB — CBC WITH DIFFERENTIAL/PLATELET
Abs Immature Granulocytes: 0.02 10*3/uL (ref 0.00–0.07)
Basophils Absolute: 0 10*3/uL (ref 0.0–0.1)
Basophils Relative: 0 %
Eosinophils Absolute: 0 10*3/uL (ref 0.0–0.5)
Eosinophils Relative: 0 %
HCT: 30.4 % — ABNORMAL LOW (ref 39.0–52.0)
Hemoglobin: 10.4 g/dL — ABNORMAL LOW (ref 13.0–17.0)
Immature Granulocytes: 0 %
Lymphocytes Relative: 10 %
Lymphs Abs: 0.4 10*3/uL — ABNORMAL LOW (ref 0.7–4.0)
MCH: 32.1 pg (ref 26.0–34.0)
MCHC: 34.2 g/dL (ref 30.0–36.0)
MCV: 93.8 fL (ref 80.0–100.0)
Monocytes Absolute: 0.5 10*3/uL (ref 0.1–1.0)
Monocytes Relative: 10 %
Neutro Abs: 3.6 10*3/uL (ref 1.7–7.7)
Neutrophils Relative %: 80 %
Platelets: 106 10*3/uL — ABNORMAL LOW (ref 150–400)
RBC: 3.24 MIL/uL — ABNORMAL LOW (ref 4.22–5.81)
RDW: 13.5 % (ref 11.5–15.5)
WBC: 4.5 10*3/uL (ref 4.0–10.5)
nRBC: 0 % (ref 0.0–0.2)

## 2018-12-20 LAB — TRIGLYCERIDES: Triglycerides: 41 mg/dL (ref <150)

## 2018-12-20 LAB — LACTIC ACID, PLASMA: Lactic Acid, Venous: 0.9 mmol/L (ref 0.5–1.9)

## 2018-12-20 LAB — GLUCOSE, CAPILLARY: Glucose-Capillary: 173 mg/dL — ABNORMAL HIGH (ref 70–99)

## 2018-12-20 LAB — COMPREHENSIVE METABOLIC PANEL
ALT: 21 U/L (ref 0–44)
AST: 37 U/L (ref 15–41)
Albumin: 3.5 g/dL (ref 3.5–5.0)
Alkaline Phosphatase: 79 U/L (ref 38–126)
Anion gap: 9 (ref 5–15)
BUN: 26 mg/dL — ABNORMAL HIGH (ref 6–20)
CO2: 25 mmol/L (ref 22–32)
Calcium: 8.1 mg/dL — ABNORMAL LOW (ref 8.9–10.3)
Chloride: 99 mmol/L (ref 98–111)
Creatinine, Ser: 1.69 mg/dL — ABNORMAL HIGH (ref 0.61–1.24)
GFR calc Af Amer: 51 mL/min — ABNORMAL LOW (ref >60)
GFR calc non Af Amer: 44 mL/min — ABNORMAL LOW (ref >60)
Glucose, Bld: 110 mg/dL — ABNORMAL HIGH (ref 70–99)
Potassium: 4.7 mmol/L (ref 3.5–5.1)
Sodium: 133 mmol/L — ABNORMAL LOW (ref 135–145)
Total Bilirubin: 0.7 mg/dL (ref 0.3–1.2)
Total Protein: 6.7 g/dL (ref 6.5–8.1)

## 2018-12-20 LAB — PROTIME-INR
INR: 1.1 (ref 0.8–1.2)
Prothrombin Time: 14.1 seconds (ref 11.4–15.2)

## 2018-12-20 LAB — FIBRIN DERIVATIVES D-DIMER (ARMC ONLY): Fibrin derivatives D-dimer (ARMC): 707.32 ng/mL (FEU) — ABNORMAL HIGH (ref 0.00–499.00)

## 2018-12-20 LAB — BASIC METABOLIC PANEL
Anion gap: 8 (ref 5–15)
BUN: 24 mg/dL — ABNORMAL HIGH (ref 6–20)
CO2: 23 mmol/L (ref 22–32)
Calcium: 7.4 mg/dL — ABNORMAL LOW (ref 8.9–10.3)
Chloride: 99 mmol/L (ref 98–111)
Creatinine, Ser: 1.37 mg/dL — ABNORMAL HIGH (ref 0.61–1.24)
GFR calc Af Amer: >60 mL/min (ref >60)
GFR calc non Af Amer: 57 mL/min — ABNORMAL LOW (ref >60)
Glucose, Bld: 128 mg/dL — ABNORMAL HIGH (ref 70–99)
Potassium: 4.7 mmol/L (ref 3.5–5.1)
Sodium: 130 mmol/L — ABNORMAL LOW (ref 135–145)

## 2018-12-20 LAB — C-REACTIVE PROTEIN: CRP: 23.1 mg/dL — ABNORMAL HIGH (ref <1.0)

## 2018-12-20 LAB — HIV ANTIBODY (ROUTINE TESTING W REFLEX): HIV Screen 4th Generation wRfx: NONREACTIVE

## 2018-12-20 LAB — INFLUENZA PANEL BY PCR (TYPE A & B)
Influenza A By PCR: NEGATIVE
Influenza B By PCR: NEGATIVE

## 2018-12-20 LAB — LIPASE, BLOOD: Lipase: 24 U/L (ref 11–51)

## 2018-12-20 LAB — TROPONIN I (HIGH SENSITIVITY): Troponin I (High Sensitivity): 8 ng/L (ref <18)

## 2018-12-20 LAB — SARS CORONAVIRUS 2 (TAT 6-24 HRS): SARS Coronavirus 2: POSITIVE — AB

## 2018-12-20 LAB — FIBRINOGEN: Fibrinogen: 545 mg/dL — ABNORMAL HIGH (ref 210–475)

## 2018-12-20 LAB — PROCALCITONIN: Procalcitonin: 1.09 ng/mL

## 2018-12-20 LAB — APTT: aPTT: 40 seconds — ABNORMAL HIGH (ref 24–36)

## 2018-12-20 MED ORDER — LAMOTRIGINE 25 MG PO TABS: 75.0000 mg | ORAL_TABLET | Freq: Every day | ORAL | Status: DC

## 2018-12-20 MED ORDER — CLONIDINE HCL 0.2 MG/24HR TD PTWK
0.2000 mg | MEDICATED_PATCH | TRANSDERMAL | Status: DC
  Administered 2018-12-21: 0.2 mg via TRANSDERMAL
  Filled 2018-12-20: qty 1

## 2018-12-20 MED ORDER — SODIUM CHLORIDE 0.9 % IV SOLN
INTRAVENOUS | Status: DC
  Administered 2018-12-20: 03:00:00 via INTRAVENOUS

## 2018-12-20 MED ORDER — SODIUM CHLORIDE 0.9 % IV BOLUS
1000.0000 mL | Freq: Once | INTRAVENOUS | Status: AC
  Administered 2018-12-20: 1000 mL via INTRAVENOUS

## 2018-12-20 MED ORDER — ONDANSETRON HCL 4 MG PO TABS: 4.0000 mg | ORAL_TABLET | Freq: Four times a day (QID) | ORAL | Status: DC | PRN

## 2018-12-20 MED ORDER — PROSIGHT PO TABS
1.0000 | ORAL_TABLET | Freq: Every day | ORAL | Status: DC
  Administered 2018-12-21 – 2018-12-24 (×4): 1 via ORAL
  Filled 2018-12-20 (×5): qty 1

## 2018-12-20 MED ORDER — CLONAZEPAM 0.5 MG PO TABS
0.5000 mg | ORAL_TABLET | Freq: Three times a day (TID) | ORAL | Status: DC
  Administered 2018-12-20 (×2): 0.5 mg via ORAL
  Filled 2018-12-20 (×2): qty 1

## 2018-12-20 MED ORDER — ENOXAPARIN SODIUM 40 MG/0.4ML ~~LOC~~ SOLN
40.0000 mg | SUBCUTANEOUS | Status: DC
  Administered 2018-12-20: 40 mg via SUBCUTANEOUS
  Filled 2018-12-20: qty 0.4

## 2018-12-20 MED ORDER — CLONIDINE HCL 0.2 MG/24HR TD PTWK
0.2000 mg | MEDICATED_PATCH | TRANSDERMAL | Status: DC
  Filled 2018-12-20: qty 1

## 2018-12-20 MED ORDER — FERROUS SULFATE 325 (65 FE) MG PO TABS
325.0000 mg | ORAL_TABLET | Freq: Every day | ORAL | Status: DC
  Administered 2018-12-21 – 2018-12-24 (×4): 325 mg via ORAL
  Filled 2018-12-20 (×4): qty 1

## 2018-12-20 MED ORDER — MELATONIN 3 MG PO TABS
3.0000 mg | ORAL_TABLET | Freq: Every day | ORAL | Status: DC
  Administered 2018-12-20 – 2018-12-22 (×3): 3 mg via ORAL
  Filled 2018-12-20 (×5): qty 1

## 2018-12-20 MED ORDER — HYDROCOD POLST-CPM POLST ER 10-8 MG/5ML PO SUER
5.0000 mL | Freq: Two times a day (BID) | ORAL | Status: DC | PRN
  Administered 2018-12-23: 5 mL via ORAL
  Filled 2018-12-20: qty 5

## 2018-12-20 MED ORDER — SODIUM CHLORIDE 0.9 % IV SOLN
500.0000 mg | INTRAVENOUS | Status: DC
  Administered 2018-12-20: 02:00:00 500 mg via INTRAVENOUS
  Filled 2018-12-20: qty 500

## 2018-12-20 MED ORDER — SODIUM CHLORIDE 0.9 % IV SOLN
INTRAVENOUS | Status: AC
  Administered 2018-12-20: 22:00:00 via INTRAVENOUS

## 2018-12-20 MED ORDER — ENOXAPARIN SODIUM 40 MG/0.4ML ~~LOC~~ SOLN
40.0000 mg | SUBCUTANEOUS | Status: DC
  Filled 2018-12-20 (×2): qty 0.4

## 2018-12-20 MED ORDER — LORATADINE 10 MG PO TABS
10.0000 mg | ORAL_TABLET | Freq: Every day | ORAL | Status: DC
  Administered 2018-12-21 – 2018-12-24 (×4): 10 mg via ORAL
  Filled 2018-12-20 (×4): qty 1

## 2018-12-20 MED ORDER — QUETIAPINE FUMARATE 25 MG PO TABS
150.0000 mg | ORAL_TABLET | Freq: Three times a day (TID) | ORAL | Status: DC
  Administered 2018-12-20 – 2018-12-22 (×7): 150 mg via ORAL
  Filled 2018-12-20 (×8): qty 6

## 2018-12-20 MED ORDER — HYDROCOD POLST-CPM POLST ER 10-8 MG/5ML PO SUER: 5.0000 mL | Freq: Two times a day (BID) | ORAL | Status: DC | PRN

## 2018-12-20 MED ORDER — SODIUM CHLORIDE 0.9 % IV SOLN: 500.0000 mg | INTRAVENOUS | Status: DC

## 2018-12-20 MED ORDER — SODIUM CHLORIDE 0.9 % IV SOLN
100.0000 mg | INTRAVENOUS | Status: AC
  Administered 2018-12-21: 100 mg via INTRAVENOUS
  Filled 2018-12-20: qty 100

## 2018-12-20 MED ORDER — LAMOTRIGINE 25 MG PO TABS
175.0000 mg | ORAL_TABLET | Freq: Every day | ORAL | Status: DC
  Administered 2018-12-20 – 2018-12-23 (×4): 175 mg via ORAL
  Filled 2018-12-20 (×5): qty 1

## 2018-12-20 MED ORDER — LAMOTRIGINE 100 MG PO TABS
100.0000 mg | ORAL_TABLET | Freq: Two times a day (BID) | ORAL | Status: DC
  Administered 2018-12-20: 100 mg via ORAL
  Filled 2018-12-20: qty 1

## 2018-12-20 MED ORDER — DEXAMETHASONE SODIUM PHOSPHATE 10 MG/ML IJ SOLN
10.0000 mg | Freq: Once | INTRAMUSCULAR | Status: AC
  Administered 2018-12-20: 10 mg via INTRAVENOUS
  Filled 2018-12-20: qty 1

## 2018-12-20 MED ORDER — ONDANSETRON HCL 4 MG/2ML IJ SOLN: 4.0000 mg | Freq: Four times a day (QID) | INTRAMUSCULAR | Status: DC | PRN

## 2018-12-20 MED ORDER — LAMOTRIGINE 100 MG PO TABS
100.0000 mg | ORAL_TABLET | Freq: Every day | ORAL | Status: DC
  Administered 2018-12-21 – 2018-12-24 (×4): 100 mg via ORAL
  Filled 2018-12-20 (×5): qty 1

## 2018-12-20 MED ORDER — DEXAMETHASONE SODIUM PHOSPHATE 10 MG/ML IJ SOLN: 6.0000 mg | INTRAMUSCULAR | 0 refills | Status: DC

## 2018-12-20 MED ORDER — SODIUM CHLORIDE 0.9 % IV SOLN
100.0000 mg | INTRAVENOUS | Status: AC
  Administered 2018-12-22 – 2018-12-24 (×3): 100 mg via INTRAVENOUS
  Filled 2018-12-20 (×3): qty 100

## 2018-12-20 MED ORDER — ACETAMINOPHEN 650 MG RE SUPP: 650.0000 mg | Freq: Four times a day (QID) | RECTAL | Status: DC | PRN

## 2018-12-20 MED ORDER — LORAZEPAM 2 MG/ML IJ SOLN: 1.0000 mg | INTRAMUSCULAR | Status: DC | PRN

## 2018-12-20 MED ORDER — MAGNESIUM HYDROXIDE 400 MG/5ML PO SUSP: 30.0000 mL | Freq: Every day | ORAL | Status: DC | PRN

## 2018-12-20 MED ORDER — GUAIFENESIN ER 600 MG PO TB12
600.0000 mg | ORAL_TABLET | Freq: Two times a day (BID) | ORAL | Status: DC
  Administered 2018-12-20 – 2018-12-24 (×8): 600 mg via ORAL
  Filled 2018-12-20 (×8): qty 1

## 2018-12-20 MED ORDER — POLYETHYLENE GLYCOL 3350 17 G PO PACK
17.0000 g | PACK | Freq: Two times a day (BID) | ORAL | Status: DC
  Administered 2018-12-20 – 2018-12-21 (×3): 17 g via ORAL
  Filled 2018-12-20 (×4): qty 1

## 2018-12-20 MED ORDER — SODIUM CHLORIDE 0.9 % IV SOLN
2.0000 g | INTRAVENOUS | Status: DC
  Administered 2018-12-20: 01:00:00 2 g via INTRAVENOUS
  Filled 2018-12-20: qty 20

## 2018-12-20 MED ORDER — SODIUM CHLORIDE 0.9 % IV SOLN
200.0000 mg | Freq: Once | INTRAVENOUS | Status: AC
  Administered 2018-12-20: 200 mg via INTRAVENOUS
  Filled 2018-12-20: qty 40

## 2018-12-20 MED ORDER — DEXAMETHASONE SODIUM PHOSPHATE 10 MG/ML IJ SOLN
6.0000 mg | INTRAMUSCULAR | Status: DC
  Administered 2018-12-21 – 2018-12-24 (×4): 6 mg via INTRAVENOUS
  Filled 2018-12-20 (×4): qty 1

## 2018-12-20 MED ORDER — GUAIFENESIN-DM 100-10 MG/5ML PO SYRP: 10.0000 mL | ORAL_SOLUTION | ORAL | Status: DC | PRN

## 2018-12-20 MED ORDER — DEXAMETHASONE SODIUM PHOSPHATE 10 MG/ML IJ SOLN
6.0000 mg | INTRAMUSCULAR | Status: DC
  Administered 2018-12-20: 6 mg via INTRAVENOUS
  Filled 2018-12-20: qty 1

## 2018-12-20 MED ORDER — PRESERVISION AREDS 2 PO CAPS: 1.0000 | ORAL_CAPSULE | Freq: Two times a day (BID) | ORAL | Status: DC

## 2018-12-20 MED ORDER — SENNA 8.6 MG PO TABS: 2.0000 | ORAL_TABLET | Freq: Every evening | ORAL | Status: DC | PRN

## 2018-12-20 MED ORDER — ACETAMINOPHEN 325 MG PO TABS: 650.0000 mg | ORAL_TABLET | Freq: Four times a day (QID) | ORAL | Status: DC | PRN

## 2018-12-20 MED ORDER — GUAIFENESIN ER 600 MG PO TB12
600.0000 mg | ORAL_TABLET | Freq: Two times a day (BID) | ORAL | Status: DC
  Administered 2018-12-20: 600 mg via ORAL
  Filled 2018-12-20: qty 1

## 2018-12-20 MED ORDER — CLONAZEPAM 0.5 MG PO TBDP
0.5000 mg | ORAL_TABLET | Freq: Three times a day (TID) | ORAL | Status: DC
  Administered 2018-12-20 – 2018-12-24 (×12): 0.5 mg via ORAL
  Filled 2018-12-20 (×12): qty 1

## 2018-12-20 MED ORDER — LAMOTRIGINE 100 MG PO TABS: 100.0000 mg | ORAL_TABLET | Freq: Two times a day (BID) | ORAL | Status: DC

## 2018-12-20 MED ORDER — TRAZODONE HCL 50 MG PO TABS: 25.0000 mg | ORAL_TABLET | Freq: Every evening | ORAL | Status: DC | PRN

## 2018-12-20 MED ORDER — GUAIFENESIN ER 600 MG PO TB12: 600.0000 mg | ORAL_TABLET | Freq: Two times a day (BID) | ORAL | 0 refills | Status: DC

## 2018-12-20 MED ORDER — AMLODIPINE BESYLATE 10 MG PO TABS
10.0000 mg | ORAL_TABLET | Freq: Every day | ORAL | Status: DC
  Administered 2018-12-21 – 2018-12-24 (×4): 10 mg via ORAL
  Filled 2018-12-20 (×4): qty 1

## 2018-12-20 MED ORDER — QUETIAPINE FUMARATE 25 MG PO TABS
25.0000 mg | ORAL_TABLET | Freq: Every day | ORAL | Status: DC | PRN
  Filled 2018-12-20 (×5): qty 1

## 2018-12-20 MED ORDER — SODIUM CHLORIDE 0.9 % IV SOLN: 2.0000 g | INTRAVENOUS | Status: DC

## 2018-12-20 NOTE — Plan of Care (Signed)
Discussed care with Oneida Arenas who is a retired [employer] scientist who shares guardianship with her brother Simona Huh.  Directed to contact her, and Care Manager Livia Snellen at [phone] or [phone] for discharge planning and questions.

## 2018-12-20 NOTE — Discharge Summary (Signed)
Physician Discharge Summary  Frank Conley [id number] DOB: 04-27-1960 DOA: 12/19/2018  PCP: Samuel Bouche, MD  Admit date: 12/19/2018 Discharge date: 12/20/2018  Admitted From: group home Disposition:  [place]  Recommendations for Outpatient Follow-up:  1. Follow up with MD at [facility]  Equipment/Devices: 2l 02 via [place]  Discharge Condition:Fair CODE STATUS:full code Diet recommendation: regular   Discharge Diagnoses:  principle problems   COVID-19 virus infection   Acute respiratory failure with hypoxia ([place])   Active Problems:   Multifocal pneumonia   Seizure disorder ([place])   Essential hypertension   AKI (acute kidney injury) ([place])   Hyponatremia   Pancytopenia ([place])   Retarded communication skills   Brief narrative/ HPI 58 year old male with mental retardation, hypertension, depression, seizures resident of group home brought to the ED with acute onset of fever with chills, generalized weakness.  Patient having dry cough with some shortness of breath for almost 1 week associated with some nasal congestion.  No nausea, vomiting, abdominal pain, diarrhea or chest pain.  No urinary symptoms.  No exposure to COVID-19 at the group home.  Was seen by his PCP yesterday and swab sent for COVID-19. In the ED he was febrile with temperature 100.1 F, O2 sat of 92% on room air but subsequently required 2 L via nasal cannula.  Blood work showed sodium of 133, AKI with creatinine of 1.69, mildly elevated LDH and ferritin, normal lactic acid.  Elevated fibrinogen and D-dimer.  COVID-19 test done which came back positive. Blood cultures sent.  Chest x-ray showing bilateral hazy opacity bilaterally suggestive of multifocal pneumonia. Placed on empiric IV Rocephin and azithromycin along with 1 L normal saline bolus and given 10 mg IV Decadron. After COVID-19 test resulted positive patient being transferred to [facility] for further management.  Hospital  course Acute respiratory failure with hypoxia (HCC) Multifocal pneumonia with COVID-19 infection Supportive care with 2 L O2 via nasal cannula.  Receiving IV Decadron daily.  I will start him on remdesivir per pharmacy.  Follow D-dimer and fibrinogen daily.  Supportive care with antitussives.  Discontinued empiric antibiotics.  Continue IV fluids. Doppler lower extremity negative for DVT.   Active symptoms  acute kidney injury Suspect prerenal ATN secondary to infection and dehydration.  Avoid nephrotoxins.  IV fluids.  Chronic iron deficiency anemia/pancytopenia Stable.  Monitor.  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  Resumed Klonopin and Lamictal.  On high-dose Seroquel.  Monitor QTC.  Seizure disorder Resume home meds.  Essential hypertension Resume  Family communication: Brother at bedside who reports to be patient's legal guardian. Agrees with transferring patient to [facility].    Discharge Instructions   Allergies as of 12/20/2018      Reactions   Dilantin [phenytoin]    Keppra [levetiracetam]    Lisinopril       Medication List    TAKE these medications   amLODipine 10 MG tablet Commonly known as: NORVASC Take 10 mg by mouth daily.   clonazePAM 0.5 MG disintegrating tablet Commonly known as: KLONOPIN Take 0.5 mg by mouth 3 (three) times daily.   cloNIDine 0.2 mg/24hr patch Commonly known as: CATAPRES - Dosed in mg/24 hr Place 0.2 mg onto the skin once a week.   dexamethasone 10 MG/ML injection Commonly known as: DECADRON Inject 0.6 mLs (6 mg total) into the vein daily. Start taking on: December 21, 2018   ferrous sulfate 325 (65 FE) MG tablet Take  325 mg by mouth daily.   Fish Oil 1000 MG Caps Take 2,000 mg by mouth daily.   guaiFENesin 600 MG 12 hr tablet Commonly known as: MUCINEX Take 1 tablet (600 mg total) by mouth 2 (two) times daily.   lamoTRIgine 25 MG tablet Commonly  known as: LAMICTAL Take 75 mg by mouth at bedtime. Take in addition to 100 mg tablet for a total dose of 175 mg   lamoTRIgine 100 MG tablet Commonly known as: LAMICTAL Take 100 mg by mouth 2 (two) times daily.   loratadine 10 MG tablet Commonly known as: CLARITIN Take 10 mg by mouth daily.   magnesium oxide 400 MG tablet Commonly known as: MAG-OX Take 400 mg by mouth daily.   Melatonin 3 MG Tabs Take 3 mg by mouth every evening.   polyethylene glycol 17 g packet Commonly known as: MIRALAX / GLYCOLAX Take 17 g by mouth 2 (two) times daily.   PreserVision AREDS 2 Caps Take 1 capsule by mouth 2 (two) times daily.   QUEtiapine 25 MG tablet Commonly known as: SEROQUEL Take 25 mg by mouth daily as needed for agitation.   QUEtiapine 100 MG tablet Commonly known as: SEROQUEL Take 150 mg by mouth 3 (three) times daily.   senna 8.6 MG Tabs tablet Commonly known as: SENOKOT Take 2 tablets by mouth at bedtime as needed for mild constipation.       Allergies  Allergen Reactions  . Dilantin [Phenytoin]   . Keppra [Levetiracetam]   . Lisinopril       Procedures/Studies: Dg Chest 2 View  Result Date: 12/19/2018 CLINICAL DATA:  Cough. 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 The heart size is borderline enlarged. There is no pneumothorax. No large pleural effusion. There is scattered hazy airspace opacities bilaterally, greatest within the left lower lung zone. There is no acute osseous abnormality. IMPRESSION: Bilateral hazy airspace opacities, greatest within the left lower lung zone. Findings may reflect multifocal pneumonia or asymmetric edema. Electronically Signed   By: Constance Holster M.D.   On: 12/19/2018 23:19   US Venous Img Lower Bilateral (dvt)  Result Date: 12/20/2018 CLINICAL DATA:  Elevated D-dimer.  Evaluate for DVT. EXAM: BILATERAL LOWER EXTREMITY VENOUS DOPPLER ULTRASOUND TECHNIQUE:  Gray-scale sonography with graded compression, as well as color Doppler and duplex ultrasound were performed to evaluate the lower extremity deep venous systems from the level of the common femoral vein and including the common femoral, femoral, profunda femoral, popliteal and calf veins including the posterior tibial, peroneal and gastrocnemius veins when visible. The superficial great saphenous vein was also interrogated. Spectral Doppler was utilized to evaluate flow at rest and with distal augmentation maneuvers in the common femoral, femoral and popliteal veins. COMPARISON:  Left lower extremity venous doppler ultrasound - 09/21/2012 FINDINGS: RIGHT LOWER EXTREMITY Common Femoral Vein: No evidence of thrombus. Normal compressibility, respiratory phasicity and response to augmentation. Saphenofemoral Junction: No evidence of thrombus. Normal compressibility and flow on color Doppler imaging. Profunda Femoral Vein: No evidence of thrombus. Normal compressibility and flow on color Doppler imaging. Femoral Vein: No evidence of thrombus. Normal compressibility, respiratory phasicity and response to augmentation. Popliteal Vein: No evidence of thrombus. Normal compressibility, respiratory phasicity and response to augmentation. Calf Veins: No evidence of thrombus. Normal compressibility and flow on color Doppler imaging. Superficial Great Saphenous Vein: No evidence of thrombus. Normal compressibility. Venous Reflux:  None. Other Findings:  None. LEFT LOWER EXTREMITY  Common Femoral Vein: No evidence of thrombus. Normal compressibility, respiratory phasicity and response to augmentation. Saphenofemoral Junction: No evidence of thrombus. Normal compressibility and flow on color Doppler imaging. Profunda Femoral Vein: No evidence of thrombus. Normal compressibility and flow on color Doppler imaging. Femoral Vein: No evidence of thrombus. Normal compressibility, respiratory phasicity and response to augmentation.  Popliteal Vein: No evidence of thrombus. Normal compressibility, respiratory phasicity and response to augmentation. Calf Veins: No evidence of thrombus. Normal compressibility and flow on color Doppler imaging. Superficial Great Saphenous Vein: No evidence of thrombus. Normal compressibility. Venous Reflux:  None. Other Findings:  None. IMPRESSION: No evidence of deep venous thrombosis within either lower extremity. Electronically Signed   By: Sandi Mariscal M.D.   On: 12/20/2018 07:13       Subjective: Reports cough.  Denies worsening shortness of breath  Discharge Exam: Vitals:   12/20/18 1300 12/20/18 1400  BP: 122/81 124/81  Pulse: 60 60  Resp: 17 (!) 21  Temp:    SpO2: 96% 94%   Vitals:   12/20/18 1100 12/20/18 1200 12/20/18 1300 12/20/18 1400  BP: 124/84 119/81 122/81 124/81  Pulse: (!) 59 60 60 60  Resp: 18 15 17  (!) 21  Temp:      TempSrc:      SpO2: 96% 94% 96% 94%  Weight:        General: Middle-aged male not in distress HEENT: Moist mucosa, supple neck Chest: Scattered wheezing bilaterally CVs: Normal S1-S2 no murmurs GI: Soft, nondistended, nontender Musculoskeletal: Warm, no edema    The results of significant diagnostics from this hospitalization (including imaging, microbiology, ancillary and laboratory) are listed below for reference.     Microbiology: Recent Results (from the past 240 hour(s))  Blood Culture (routine x 2)     Status: None (Preliminary result)   Collection Time: 12/20/18 12:12 AM   Specimen: BLOOD  Result Value Ref Range Status   Specimen Description BLOOD BLOOD RIGHT HAND  Final   Special Requests   Final    BOTTLES DRAWN AEROBIC AND ANAEROBIC Blood Culture results may not be optimal due to an excessive volume of blood received in culture bottles   Culture   Final    NO GROWTH < 12 HOURS Performed at [facility], [address], [place], [place] [address]    Report Status PENDING  Incomplete  SARS CORONAVIRUS 2 (TAT  6-24 HRS) Nasopharyngeal Nasopharyngeal Swab     Status: Abnormal   Collection Time: 12/20/18 12:16 AM   Specimen: Nasopharyngeal Swab  Result Value Ref Range Status   SARS Coronavirus 2 POSITIVE (A) NEGATIVE Final    Comment: RESULT CALLED TO, READ BACK BY AND VERIFIED WITH: L. GANNON, [place] [facility]) AT 1410 ON 12/20/18 BY C. JESSUP, MT. (NOTE) SARS-CoV-2 target nucleic acids are DETECTED. The SARS-CoV-2 RNA is generally detectable in upper and lower respiratory specimens during the acute phase of infection. Positive results are indicative of active infection with SARS-CoV-2. Clinical  correlation with patient history and other diagnostic information is necessary to determine patient infection status. Positive results do  not rule out bacterial infection or co-infection with other viruses. The expected result is Negative. Fact Sheet for Patients: SugarRoll.be Fact Sheet for Healthcare Providers: https://www.woods-mathews.com/ This test is not yet approved or cleared by the [place] FDA and  has been authorized for detection and/or diagnosis of SARS-CoV-2 by FDA under an Emergency Use Authorization (EUA). This EUA will remain  in effect (meaning this  test can  be used) for the duration of the COVID-19 declaration under Section 564(b)(1) of the Act, 21 U.S.C. section 360bbb-3(b)(1), unless the authorization is terminated or revoked sooner. Performed at [facility], [place] [address], [place], [place] [address]   Blood Culture (routine x 2)     Status: None (Preliminary result)   Collection Time: 12/20/18 12:30 AM   Specimen: BLOOD  Result Value Ref Range Status   Specimen Description BLOOD RIGHT ANTECUBITAL  Final   Special Requests   Final    BOTTLES DRAWN AEROBIC AND ANAEROBIC Blood Culture results may not be optimal due to an excessive volume of blood received in culture bottles   Culture   Final    NO GROWTH < 12 HOURS Performed  at [facility], [address] [place], [place] [address]    Report Status PENDING  Incomplete     Labs: BNP (last 3 results) No results for input(s): BNP in the last 8760 hours. Basic Metabolic Panel: Recent Labs  Lab 12/20/18 0016 12/20/18 0535  NA 133* 130*  K 4.7 4.7  CL 99 99  CO2 25 23  GLUCOSE 110* 128*  BUN 26* 24*  CREATININE 1.69* 1.37*  CALCIUM 8.1* 7.4*   Liver Function Tests: Recent Labs  Lab 12/20/18 0016  AST 37  ALT 21  ALKPHOS 79  BILITOT 0.7  PROT 6.7  ALBUMIN 3.5   Recent Labs  Lab 12/20/18 0016  LIPASE 24   No results for input(s): AMMONIA in the last 168 hours. CBC: Recent Labs  Lab 12/20/18 0016 12/20/18 0535  WBC 4.5 4.7  NEUTROABS 3.6  --   HGB 10.4* 10.2*  HCT 30.4* 29.0*  MCV 93.8 90.9  PLT 106* 103*   Cardiac Enzymes: No results for input(s): CKTOTAL, CKMB, CKMBINDEX, TROPONINI in the last 168 hours. BNP: Invalid input(s): POCBNP CBG: No results for input(s): GLUCAP in the last 168 hours. D-Dimer No results for input(s): DDIMER in the last 72 hours. Hgb A1c No results for input(s): HGBA1C in the last 72 hours. Lipid Profile Recent Labs    12/20/18 0016  TRIG 41   Thyroid function studies No results for input(s): TSH, T4TOTAL, T3FREE, THYROIDAB in the last 72 hours.  Invalid input(s): FREET3 Anemia work up Recent Labs    12/20/18 0016  FERRITIN 334   Urinalysis    Component Value Date/Time   COLORURINE YELLOW (A) 12/20/2018 0301   APPEARANCEUR CLEAR (A) 12/20/2018 0301   APPEARANCEUR Clear 10/24/2013 2006   LABSPEC 1.018 12/20/2018 0301   LABSPEC 1.006 10/24/2013 2006   PHURINE 6.0 12/20/2018 0301   GLUCOSEU NEGATIVE 12/20/2018 0301   GLUCOSEU 150 mg/dL 10/24/2013 2006   HGBUR NEGATIVE 12/20/2018 0301   BILIRUBINUR NEGATIVE 12/20/2018 0301   BILIRUBINUR Negative 10/24/2013 2006   KETONESUR 5 (A) 12/20/2018 0301   PROTEINUR 30 (A) 12/20/2018 0301   NITRITE NEGATIVE 12/20/2018 0301    LEUKOCYTESUR NEGATIVE 12/20/2018 0301   LEUKOCYTESUR Negative 10/24/2013 2006   Sepsis Labs Invalid input(s): PROCALCITONIN,  WBC,  LACTICIDVEN Microbiology Recent Results (from the past 240 hour(s))  Blood Culture (routine x 2)     Status: None (Preliminary result)   Collection Time: 12/20/18 12:12 AM   Specimen: BLOOD  Result Value Ref Range Status   Specimen Description BLOOD BLOOD RIGHT HAND  Final   Special Requests   Final    BOTTLES DRAWN AEROBIC AND ANAEROBIC Blood Culture results may not be optimal due to an excessive volume of blood  received in culture bottles   Culture   Final    NO GROWTH < 12 HOURS Performed at [facility], [place], [place] [address]    Report Status PENDING  Incomplete  SARS CORONAVIRUS 2 (TAT 6-24 HRS) Nasopharyngeal Nasopharyngeal Swab     Status: Abnormal   Collection Time: 12/20/18 12:16 AM   Specimen: Nasopharyngeal Swab  Result Value Ref Range Status   SARS Coronavirus 2 POSITIVE (A) NEGATIVE Final    Comment: RESULT CALLED TO, READ BACK BY AND VERIFIED WITH: L. GANNON, [place] [facility]) AT 1410 ON 12/20/18 BY C. JESSUP, MT. (NOTE) SARS-CoV-2 target nucleic acids are DETECTED. The SARS-CoV-2 RNA is generally detectable in upper and lower respiratory specimens during the acute phase of infection. Positive results are indicative of active infection with SARS-CoV-2. Clinical  correlation with patient history and other diagnostic information is necessary to determine patient infection status. Positive results do  not rule out bacterial infection or co-infection with other viruses. The expected result is Negative. Fact Sheet for Patients: SugarRoll.be Fact Sheet for Healthcare Providers: https://www.woods-mathews.com/ This test is not yet approved or cleared by the [place] FDA and  has been authorized for detection and/or diagnosis of SARS-CoV-2 by FDA under an Emergency Use  Authorization (EUA). This EUA will remain  in effect (meaning this  test can be used) for the duration of the COVID-19 declaration under Section 564(b)(1) of the Act, 21 U.S.C. section 360bbb-3(b)(1), unless the authorization is terminated or revoked sooner. Performed at [facility], [place] [address] [place], [place] [address]   Blood Culture (routine x 2)     Status: None (Preliminary result)   Collection Time: 12/20/18 12:30 AM   Specimen: BLOOD  Result Value Ref Range Status   Specimen Description BLOOD RIGHT ANTECUBITAL  Final   Special Requests   Final    BOTTLES DRAWN AEROBIC AND ANAEROBIC Blood Culture results may not be optimal due to an excessive volume of blood received in culture bottles   Culture   Final    NO GROWTH < 12 HOURS Performed at [facility], [address] [place], [place] [address]    Report Status PENDING  Incomplete     Time coordinating discharge: >35 minutes  SIGNED:   Louellen Molder, MD  [facility] 12/20/2018, 2:40 PM Pager   If 7PM-7AM, please contact night-coverage [email or web address] Password [email or web address]

## 2018-12-20 NOTE — ED Notes (Signed)
Legal Guardian: Christie Nottingham, brother, Legal guardian, HCPOA-- records at [facility]. States pt is DNR status. [phone].

## 2018-12-20 NOTE — Consult Note (Signed)
Remdesivir - Pharmacy Brief Note   O:  ALT: 21 CXR: bilateral hazy airspace opacities greatest in left lower lung zone SpO2: 94-96% on 2L [place]   A/P:  COVID-19 test resulted positive 11/13 @ 1414. Plan for transfer to [place]. Un-clear on timeframe for transfer per RN. Will go ahead and order first dose to be given here.   Remdesivir 200 mg IVPB once followed by 100 mg IVPB daily x 4 days.   [place] Resident 12/20/2018 3:07 PM

## 2018-12-20 NOTE — ED Notes (Addendum)
E signature pad not working in room. Received verbal consent for transfer to [place] from legal guardian with this nurse as a witness.

## 2018-12-20 NOTE — ED Provider Notes (Signed)
[facility] Emergency Department Provider Note  ____________________________________________  Time seen: Approximately 1:38 AM  I have reviewed the triage vital signs and the nursing notes.   HISTORY  Chief Complaint Fever    Level 5 Caveat: Portions of the History and Physical including HPI and review of systems are unable to be completely obtained due to patient being a poor historian   HPI Frank Conley is a 58 y.o. male with a history of hypertension, seizures, depression, intellectual disability who was sent to the ED from his group home due to fever and nonproductive cough. The symptoms have been going on for the past 3 days and associated with generalized weakness. Normally patient is ambulatory but currently unable to hold himself in a sitting upright position. He saw his primary care doctor yesterday who did a flu test which was negative, UA which was negative, and sent a Covid test which is still pending.   Patient denies pain. Does report feeling mildly short of breath. Denies coughing up phlegm.   Past Medical History:  Diagnosis Date  . Depression   . Hypertension   . Seizures ([place])      There are no active problems to display for this patient.    History reviewed. No pertinent surgical history.   Prior to Admission medications   Medication Sig Start Date End Date Taking? Authorizing Provider  clonazePAM (KLONOPIN) 0.5 MG disintegrating tablet Take 0.5 mg by mouth 3 (three) times daily. 12/13/18   [provider]  cloNIDine (CATAPRES - DOSED IN MG/24 HR) 0.2 mg/24hr patch Place 0.2 mg onto the skin once a week. 11/29/18   [provider]  lamoTRIgine (LAMICTAL) 100 MG tablet Take 100 mg by mouth 2 (two) times daily. 12/04/18   [provider]  lamoTRIgine (LAMICTAL) 25 MG tablet Take 75 mg by mouth at bedtime. Take in addition to 100 mg tablet for a total dose of 175 mg 12/04/18   [provider]      Allergies Dilantin [phenytoin], Keppra [levetiracetam], and Lisinopril   No family history on file.  Social History Social History   Tobacco Use  . Smoking status: Not on file  Substance Use Topics  . Alcohol use: Not on file  . Drug use: Not on file    Review of Systems Level 5 Caveat: Portions of the History and Physical including HPI and review of systems are unable to be completely obtained due to patient being a poor historian   Constitutional:   Positive fever ENT:   No rhinorrhea. Cardiovascular:   No chest pain or syncope. Respiratory:   No dyspnea positive nonproductive cough. Gastrointestinal:   Negative for abdominal pain, vomiting and diarrhea.  Musculoskeletal:   Negative for focal pain or swelling ____________________________________________   PHYSICAL EXAM:  VITAL SIGNS: ED Triage Vitals  Enc Vitals Group     BP 12/19/18 2244 (!) 100/41     Pulse Rate 12/19/18 2244 80     Resp 12/19/18 2244 20     Temp 12/19/18 2244 100.1 F (37.8 C)     Temp Source 12/19/18 2244 Oral     SpO2 12/19/18 2244 92 %     Weight 12/19/18 2245 148 lb (67.1 kg)     Height --      Head Circumference --      Peak Flow --      Pain Score 12/19/18 2244 0     Pain Loc --  Pain Edu? --      Excl. in [place]? --     Vital signs reviewed, nursing assessments reviewed.   Constitutional:   Alert and oriented. Ill-appearing Eyes:   Conjunctivae are normal. EOMI. PERRL. ENT      Head:   Normocephalic and atraumatic.      Nose:   No congestion/rhinnorhea.       Mouth/Throat:   Dry mucous membranes, no pharyngeal erythema. No peritonsillar mass.       Neck:   No meningismus. Full ROM. Hematological/Lymphatic/Immunilogical:   No cervical lymphadenopathy. Cardiovascular:   RRR. Symmetric bilateral radial and DP pulses.  No murmurs. Cap refill less than 2 seconds. Respiratory:   Mild tachypnea. Rhonchi at bilateral bases. Symmetric air movement. Gastrointestinal:   Soft and  nontender. Non distended. There is no CVA tenderness.  No rebound, rigidity, or guarding. Musculoskeletal:   Normal range of motion in all extremities. No joint effusions.  No lower extremity tenderness.  No edema. Neurologic:   Normal speech and language.  Motor grossly intact. No acute focal neurologic deficits are appreciated.  Skin:    Skin is warm, dry and intact. No rash noted.  No petechiae, purpura, or bullae.  ____________________________________________    LABS (pertinent positives/negatives) (all labs ordered are listed, but only abnormal results are displayed) Labs Reviewed  COMPREHENSIVE METABOLIC PANEL - Abnormal; Notable for the following components:      Result Value   Sodium 133 (*)    Glucose, Bld 110 (*)    BUN 26 (*)    Creatinine, Ser 1.69 (*)    Calcium 8.1 (*)    GFR calc non Af Amer 44 (*)    GFR calc Af Amer 51 (*)    All other components within normal limits  CBC WITH DIFFERENTIAL/PLATELET - Abnormal; Notable for the following components:   RBC 3.24 (*)    Hemoglobin 10.4 (*)    HCT 30.4 (*)    Platelets 106 (*)    Lymphs Abs 0.4 (*)    All other components within normal limits  APTT - Abnormal; Notable for the following components:   aPTT 40 (*)    All other components within normal limits  FIBRIN DERIVATIVES D-DIMER ([facility] ONLY) - Abnormal; Notable for the following components:   Fibrin derivatives D-dimer ([facility]) 707.32 (*)    All other components within normal limits  LACTATE DEHYDROGENASE - Abnormal; Notable for the following components:   LDH 220 (*)    All other components within normal limits  FIBRINOGEN - Abnormal; Notable for the following components:   Fibrinogen 545 (*)    All other components within normal limits  CULTURE, BLOOD (ROUTINE X 2)  CULTURE, BLOOD (ROUTINE X 2)  URINE CULTURE  SARS CORONAVIRUS 2 (TAT 6-24 HRS)  LACTIC ACID, PLASMA  LIPASE, BLOOD  PROTIME-INR  FERRITIN  LACTIC ACID, PLASMA  PROCALCITONIN  URINALYSIS,  COMPLETE (UACMP) WITH MICROSCOPIC  TRIGLYCERIDES  C-REACTIVE PROTEIN   ____________________________________________   EKG    ____________________________________________    RADIOLOGY  Dg Chest 2 View  Result Date: 12/19/2018 CLINICAL DATA:  Cough. 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 The heart size is borderline enlarged. There is no pneumothorax. No large pleural effusion. There is scattered hazy airspace opacities bilaterally, greatest within the left lower lung zone. There is no acute osseous abnormality. IMPRESSION: Bilateral hazy airspace opacities, greatest within the left lower lung zone. Findings may  reflect multifocal pneumonia or asymmetric edema. Electronically Signed   By: Constance Holster M.D.   On: 12/19/2018 23:19    ____________________________________________   PROCEDURES .Critical Care Performed by: Carrie Mew, MD Authorized by: Carrie Mew, MD   Critical care provider statement:    Critical care time (minutes):  35   Critical care time was exclusive of:  Separately billable procedures and treating other patients   Critical care was necessary to treat or prevent imminent or life-threatening deterioration of the following conditions:  Respiratory failure and sepsis   Critical care was time spent personally by me on the following activities:  Development of treatment plan with patient or surrogate, discussions with consultants, evaluation of patient's response to treatment, examination of patient, obtaining history from patient or surrogate, ordering and performing treatments and interventions, ordering and review of laboratory studies, ordering and review of radiographic studies, pulse oximetry, re-evaluation of patient's condition and review of old charts    ____________________________________________  DIFFERENTIAL DIAGNOSIS   Pneumonia, sepsis, COVID-19, dehydration,  electrolyte abnormality  CLINICAL IMPRESSION / ASSESSMENT AND PLAN / ED COURSE  Medications ordered in the ED: Medications  cefTRIAXone (ROCEPHIN) 2 g in sodium chloride 0.9 % 100 mL IVPB (2 g Intravenous New Bag/Given 12/20/18 0051)  azithromycin (ZITHROMAX) 500 mg in sodium chloride 0.9 % 250 mL IVPB (has no administration in time range)  sodium chloride 0.9 % bolus 1,000 mL (1,000 mLs Intravenous New Bag/Given 12/20/18 0047)  dexamethasone (DECADRON) injection 10 mg (10 mg Intravenous Given 12/20/18 0047)    Pertinent labs & imaging results that were available during my care of the patient were reviewed by me and considered in my medical decision making (see chart for details).   Frank Conley was evaluated in Emergency Department on 12/20/2018 for the symptoms described in the history of present illness. He was evaluated in the context of the global COVID-19 pandemic, which necessitated consideration that the patient might be at risk for infection with the SARS-CoV-2 virus that causes COVID-19. Institutional protocols and algorithms that pertain to the evaluation of patients at risk for COVID-19 are in a state of rapid change based on information released by regulatory bodies including the CDC and federal and state organizations. These policies and algorithms were followed during the patient's care in the ED.   Patient presents with profound generalized weakness. Unable to hold himself in a sitting upright position. However, with assistance the patient's oxygen saturation drops to 88% sitting upright. At rest in the bed it returns to 92% on room air. Placed the patient on 2 L nasal cannula for correction of his acute hypoxic respiratory failure in the setting of chest x-ray demonstrating multifocal infiltrates consistent with pneumonia versus COVID-19. Electronic medical record reviewed, results available from primary care visit yesterday showed normal urinalysis, flu negative.  Patient  presents with a fever tachypnea and hypoxia. Sepsis protocol initiated, empiric treatment with ceftriaxone and azithromycin for community-acquired pneumonia. Covid swab ordered. Decadron will be given empirically. Plan to admit to the hospital.  Clinical Course as of Dec 20 142  Fri Dec 20, 2018  0119 D-dimer checked as part of suspected COVID-19 lab panel.  I doubt PE, and chest x-ray demonstrates multifocal pneumonia.  Will not pursue CTPA imaging at this time.  Fibrin derivatives D-dimer [facility])(!): 707.32 [PS]    Clinical Course User Index [PS] Carrie Mew, MD     ----------------------------------------- 1:43 AM on 12/20/2018 -----------------------------------------  Chemistry panel shows normal electrolytes, demonstrates acute renal insufficiency  with a creatinine of 1.7 compared to baseline of 1.1. Differential notable for lymphopenia.  ____________________________________________   FINAL CLINICAL IMPRESSION(S) / ED DIAGNOSES    Final diagnoses:  Multifocal pneumonia  Acute respiratory failure with hypoxia [facility])     ED Discharge Orders    None      Portions of this note were generated with dragon dictation software. Dictation errors may occur despite best attempts at proofreading.   Carrie Mew, MD 12/20/18 [phone]

## 2018-12-20 NOTE — H&P (Signed)
History and Physical    Frank Conley [id number] DOB: [birth year] DOA: 12/20/2018  PCP: Samuel Bouche, MD  Patient coming from: [facility] transfer, Group home  I have personally briefly reviewed patient's old medical records in [place]  Chief Complaint: Fever  HPI: Frank Conley is a 58 y.o. male with medical history significant of mental disability, HTN, seizures, MGUS, SSS s/p PPM.  Patient having dry cough with some shortness of breath for almost 1 week associated with some nasal congestion.  No nausea, vomiting, abdominal pain, diarrhea or chest pain.  No urinary symptoms.  No exposure to COVID-19 at the group home.  Was seen by his PCP yesterday and swab sent for COVID-19.   ED Course: he was febrile with temperature 100.1 F, O2 sat of 92% on room air but subsequently required 2 L via nasal cannula.  Blood work showed sodium of 133, AKI with creatinine of 1.69, mildly elevated LDH and ferritin, normal lactic acid.  Elevated fibrinogen and D-dimer.  COVID-19 test done which came back positive.  Blood cultures sent.  Chest x-ray showing bilateral hazy opacity bilaterally suggestive of multifocal pneumonia.  Placed on empiric IV Rocephin and azithromycin along with 1 L normal saline bolus and given 10 mg IV Decadron.  After COVID-19 test resulted positive patient being transferred to [facility] for further management.  Empiric ABx stopped, patient started on remdesivir and decadron.  Review of Systems: As per HPI, otherwise all review of systems negative.  Past Medical History:  Diagnosis Date   Depression    Hypertension    Pervasive developmental disorder    Seizures ([place])    SSS (sick sinus syndrome) (HCC)    PPM placed    Past Surgical History:  Procedure Laterality Date   PACEMAKER IMPLANT       reports that he has never smoked. He does not have any smokeless tobacco history on file. He reports that he does not drink alcohol or use  drugs.  Allergies  Allergen Reactions   Dilantin [Phenytoin]    Keppra [Levetiracetam]    Lisinopril     History reviewed. No pertinent family history.   Prior to Admission medications   Medication Sig Start Date End Date Taking? Authorizing Provider  amLODipine (NORVASC) 10 MG tablet Take 10 mg by mouth daily.   Yes [provider]  clonazePAM (KLONOPIN) 0.5 MG disintegrating tablet Take 0.5 mg by mouth 3 (three) times daily. 12/13/18  Yes [provider]  cloNIDine (CATAPRES - DOSED IN MG/24 HR) 0.2 mg/24hr patch Place 0.2 mg onto the skin once a week. 11/29/18  Yes [provider]  ferrous sulfate 325 (65 FE) MG tablet Take 325 mg by mouth daily.   Yes [provider]  lamoTRIgine (LAMICTAL) 100 MG tablet Take 100 mg by mouth 2 (two) times daily. 12/04/18  Yes [provider]  lamoTRIgine (LAMICTAL) 25 MG tablet Take 75 mg by mouth at bedtime. Take in addition to 100 mg tablet for a total dose of 175 mg 12/04/18  Yes [provider]  loratadine (CLARITIN) 10 MG tablet Take 10 mg by mouth daily.   Yes [provider]  magnesium oxide (MAG-OX) 400 MG tablet Take 400 mg by mouth daily.   Yes [provider]  Melatonin 3 MG TABS Take 3 mg by mouth every evening.   Yes [provider]  Multiple Vitamins-Minerals (PRESERVISION AREDS 2) CAPS Take 1 capsule by mouth 2 (two) times daily.  Yes [provider]  Omega-3 Fatty Acids (FISH OIL) 1000 MG CAPS Take 2,000 mg by mouth daily.   Yes [provider]  polyethylene glycol (MIRALAX / GLYCOLAX) 17 g packet Take 17 g by mouth 2 (two) times daily.   Yes [provider]  QUEtiapine (SEROQUEL) 100 MG tablet Take 150 mg by mouth 3 (three) times daily. 12/04/18  Yes [provider]  QUEtiapine (SEROQUEL) 25 MG tablet Take 25 mg by mouth daily as needed for agitation. 11/07/18  Yes [provider]  senna (SENOKOT) 8.6 MG TABS  tablet Take 2 tablets by mouth at bedtime as needed for mild constipation.   Yes [provider]  dexamethasone (DECADRON) 10 MG/ML injection Inject 0.6 mLs (6 mg total) into the vein daily. 12/21/18   Dhungel, Flonnie Overman, MD  guaiFENesin (MUCINEX) 600 MG 12 hr tablet Take 1 tablet (600 mg total) by mouth 2 (two) times daily. 12/20/18   Dhungel, Flonnie Overman, MD    Physical Exam: There were no vitals filed for this visit.  Constitutional: NAD, calm, comfortable Eyes: PERRL, lids and conjunctivae normal ENMT: Mucous membranes are moist. Posterior pharynx clear of any exudate or lesions.Normal dentition.  Neck: normal, supple, no masses, no thyromegaly Respiratory: clear to auscultation bilaterally, no wheezing, no crackles. Normal respiratory effort. No accessory muscle use.  Cardiovascular: Regular rate and rhythm, no murmurs / rubs / gallops. No extremity edema. 2+ pedal pulses. No carotid bruits.  Abdomen: no tenderness, no masses palpated. No hepatosplenomegaly. Bowel sounds positive.  Musculoskeletal: no clubbing / cyanosis. No joint deformity upper and lower extremities. Good ROM, no contractures. Normal muscle tone.  Skin: no rashes, lesions, ulcers. No induration Neurologic: CN 2-12 grossly intact. Sensation intact, DTR normal. Strength 5/5 in all 4.  Psychiatric: Normal judgment and insight. Alert and oriented x 3. Normal mood.    Labs on Admission: I have personally reviewed following labs and imaging studies  CBC: Recent Labs  Lab 12/20/18 0016 12/20/18 0535  WBC 4.5 4.7  NEUTROABS 3.6  --   HGB 10.4* 10.2*  HCT 30.4* 29.0*  MCV 93.8 90.9  PLT 106* [id number]*   Basic Metabolic Panel: Recent Labs  Lab 12/20/18 0016 12/20/18 0535  NA 133* 130*  K 4.7 4.7  CL 99 99  CO2 25 23  GLUCOSE 110* 128*  BUN 26* 24*  CREATININE 1.69* 1.37*  CALCIUM 8.1* 7.4*   GFR: Estimated Creatinine Clearance: 57.1 mL/min (A) (by C-G formula based on SCr of 1.37 mg/dL (H)). Liver  Function Tests: Recent Labs  Lab 12/20/18 0016  AST 37  ALT 21  ALKPHOS 79  BILITOT 0.7  PROT 6.7  ALBUMIN 3.5   Recent Labs  Lab 12/20/18 0016  LIPASE 24   No results for input(s): AMMONIA in the last 168 hours. Coagulation Profile: Recent Labs  Lab 12/20/18 0016  INR 1.1   Cardiac Enzymes: No results for input(s): CKTOTAL, CKMB, CKMBINDEX, TROPONINI in the last 168 hours. BNP (last 3 results) No results for input(s): PROBNP in the last 8760 hours. HbA1C: No results for input(s): HGBA1C in the last 72 hours. CBG: No results for input(s): GLUCAP in the last 168 hours. Lipid Profile: Recent Labs    12/20/18 0016  TRIG 41   Thyroid Function Tests: No results for input(s): TSH, T4TOTAL, FREET4, T3FREE, THYROIDAB in the last 72 hours. Anemia Panel: Recent Labs    12/20/18 0016  FERRITIN 334   Urine analysis:    Component Value Date/Time  COLORURINE YELLOW (A) 12/20/2018 0301   APPEARANCEUR CLEAR (A) 12/20/2018 0301   APPEARANCEUR Clear 10/24/2013 2006   LABSPEC 1.018 12/20/2018 0301   LABSPEC 1.006 10/24/2013 2006   PHURINE 6.0 12/20/2018 0301   GLUCOSEU NEGATIVE 12/20/2018 0301   GLUCOSEU 150 mg/dL 10/24/2013 2006   HGBUR NEGATIVE 12/20/2018 0301   BILIRUBINUR NEGATIVE 12/20/2018 0301   BILIRUBINUR Negative 10/24/2013 2006   KETONESUR 5 (A) 12/20/2018 0301   PROTEINUR 30 (A) 12/20/2018 0301   NITRITE NEGATIVE 12/20/2018 0301   LEUKOCYTESUR NEGATIVE 12/20/2018 0301   LEUKOCYTESUR Negative 10/24/2013 2006    Radiological Exams on Admission: Dg Chest 2 View  Result Date: 12/19/2018 CLINICAL DATA:  Cough. 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 The heart size is borderline enlarged. There is no pneumothorax. No large pleural effusion. There is scattered hazy airspace opacities bilaterally, greatest within the left lower lung zone. There is no acute osseous abnormality. IMPRESSION:  Bilateral hazy airspace opacities, greatest within the left lower lung zone. Findings may reflect multifocal pneumonia or asymmetric edema. Electronically Signed   By: Constance Holster M.D.   On: 12/19/2018 23:19   US Venous Img Lower Bilateral (dvt)  Result Date: 12/20/2018 CLINICAL DATA:  Elevated D-dimer.  Evaluate for DVT. EXAM: BILATERAL LOWER EXTREMITY VENOUS DOPPLER ULTRASOUND TECHNIQUE: Gray-scale sonography with graded compression, as well as color Doppler and duplex ultrasound were performed to evaluate the lower extremity deep venous systems from the level of the common femoral vein and including the common femoral, femoral, profunda femoral, popliteal and calf veins including the posterior tibial, peroneal and gastrocnemius veins when visible. The superficial great saphenous vein was also interrogated. Spectral Doppler was utilized to evaluate flow at rest and with distal augmentation maneuvers in the common femoral, femoral and popliteal veins. COMPARISON:  Left lower extremity venous doppler ultrasound - 09/21/2012 FINDINGS: RIGHT LOWER EXTREMITY Common Femoral Vein: No evidence of thrombus. Normal compressibility, respiratory phasicity and response to augmentation. Saphenofemoral Junction: No evidence of thrombus. Normal compressibility and flow on color Doppler imaging. Profunda Femoral Vein: No evidence of thrombus. Normal compressibility and flow on color Doppler imaging. Femoral Vein: No evidence of thrombus. Normal compressibility, respiratory phasicity and response to augmentation. Popliteal Vein: No evidence of thrombus. Normal compressibility, respiratory phasicity and response to augmentation. Calf Veins: No evidence of thrombus. Normal compressibility and flow on color Doppler imaging. Superficial Great Saphenous Vein: No evidence of thrombus. Normal compressibility. Venous Reflux:  None. Other Findings:  None. LEFT LOWER EXTREMITY Common Femoral Vein: No evidence of thrombus. Normal  compressibility, respiratory phasicity and response to augmentation. Saphenofemoral Junction: No evidence of thrombus. Normal compressibility and flow on color Doppler imaging. Profunda Femoral Vein: No evidence of thrombus. Normal compressibility and flow on color Doppler imaging. Femoral Vein: No evidence of thrombus. Normal compressibility, respiratory phasicity and response to augmentation. Popliteal Vein: No evidence of thrombus. Normal compressibility, respiratory phasicity and response to augmentation. Calf Veins: No evidence of thrombus. Normal compressibility and flow on color Doppler imaging. Superficial Great Saphenous Vein: No evidence of thrombus. Normal compressibility. Venous Reflux:  None. Other Findings:  None. IMPRESSION: No evidence of deep venous thrombosis within either lower extremity. Electronically Signed   By: Sandi Mariscal M.D.   On: 12/20/2018 07:13    EKG: Independently reviewed.  Assessment/Plan Principal Problem:   Acute respiratory failure with hypoxia [facility]) Active Problems:   Seizure disorder [facility])   Essential hypertension   Hyponatremia  Pancytopenia ([place])   Retarded communication skills   Pneumonia due to COVID-19 virus   CKD (chronic kidney disease) stage 3, GFR 30-59 ml/min    1. Acute resp failure with hypoxia due to COVID-19 PNA 1. COVID pathway 2. remdesivir 3. Decadron 4. Daily labs 5. Cont pulse ox 2. Hyponatremia - 1. On NS at 75 cc/hr 2. Repeat CMP in AM 3. CKD stage 3 - 1. Small acute component yesterday, but creat of 1.3 today actually looks like his baseline based on [facility] labs for past couple of years. 4. Seizure disorder - 1. Continue lamictal 5. HTN - 1. Continue clonidine patch and amlodipine 6. Developmental disability - 1. Continue seroquel, klonopin 2. Tele monitor for QTc monitoring 7. SSS - 1. Also has h/o SSS, though this seems pretty stable with PPM in place. 2. Last PPM check on 12/03/2018 looks good 3. Last known EF is nl  though last echo I can see is 2016; no known h/o CHF per the cards notes. 8. Mild Pancytopenia - 1. ? Related to MGUS? 2. Daily CBC  DVT prophylaxis: Lovenox Code Status: Full Family Communication: No family in room Disposition Plan: Home after admit Consults called: None Admission status: Admit to inpatient  Severity of Illness: The appropriate patient status for this patient is INPATIENT. Inpatient status is judged to be reasonable and necessary in order to provide the required intensity of service to ensure the patient's safety. The patient's presenting symptoms, physical exam findings, and initial radiographic and laboratory data in the context of their chronic comorbidities is felt to place them at high risk for further clinical deterioration. Furthermore, it is not anticipated that the patient will be medically stable for discharge from the hospital within 2 midnights of admission. The following factors support the patient status of inpatient.   IP status due to new O2 requirement secondary to COVID PNA.   * I certify that at the point of admission it is my clinical judgment that the patient will require inpatient hospital care spanning beyond 2 midnights from the point of admission due to high intensity of service, high risk for further deterioration and high frequency of surveillance required.*    Reyhan Moronta M. DO [facility]  How to contact the [facility] Attending or Consulting provider [place] or covering provider during after hours [place], for this patient?  1. Check the care team in [facility] and look for a) attending/consulting [facility] provider listed and b) the [facility] team listed 2. Log into [email or web address]  Amion Physician Scheduling and messaging for groups and whole hospitals  On call and physician scheduling software for group practices, residents, hospitalists and other medical providers for call, clinic, rotation and shift schedules. On[employer] is a hospital-wide system  for scheduling doctors and paging doctors on call. EasyPlot is for scientific plotting and data analysis.  [email or web address]  and use [place]'s universal password to access. If you do not have the password, please contact the hospital operator.  3. Locate the [facility] provider you are looking for under [facility] and page to a number that you can be directly reached. 4. If you still have difficulty reaching the provider, please page the [facility] (Director on Call) for the Hospitalists listed on amion for assistance.  12/20/2018, 10:32 PM

## 2018-12-20 NOTE — H&P (Addendum)
[place] [facility] [place] NAME: Frank Conley    MR#:  [id number]  DATE OF BIRTH:  Mar 31, 1960  DATE OF ADMISSION:  12/19/2018  PRIMARY CARE PHYSICIAN: Samuel Bouche, MD   REQUESTING/REFERRING PHYSICIAN: Brenton Grills, MD  CHIEF COMPLAINT:   Chief Complaint  Patient presents with  . Fever    HISTORY OF PRESENT ILLNESS:  Frank Conley  is a 58 y.o. Caucasian male with a known history of depression, hypertension and seizures, who presented to the emergency room with acute onset of fever with associated chills today and significant generalized weakness which has been going on over the last week with dry cough and mild dyspnea without wheezing or chest pain or palpitations.  He admitted to mild rhinorrhea and nasal congestion.  He denied any nausea or vomiting or diarrhea or abdominal pain.  No dysuria, oliguria or hematuria or flank pain.  No leg pain or edema recent travels or surgeries.  No reported recent sick exposure to COVID-19.  He was seen  by his primary care physician yesterday yesterday.  His urinalysis was negative as well as his influenza a and B antigens.  His COVID-19 test was sent and is pending.  Upon presentation to the emergency room temperature was 100.1, blood pressure 100/141 with pulse oximetry 92% on room air.  Labs revealed mild hyponatremia 133 and a creatinine of 1.69 up from 1.14 on 10/12/2018 and a BUN of 26 compared to 20 then.  Lactic acid was 0.9.  LDH came back slightly elevated to 20 and ferritin was upper normal.  CBC showed hemoglobin of 10.4 and hematocrit 30.4 compared to 12 and 35 on 10/12/2018.  ANC/ALC ratio was 3.6/0.4.  Fibrinogen was 545 and fibrin derivatives D-dimer was 707.  COVID-19 test done here is currently pending.  Blood cultures were sent.  Two-view chest x-ray showed bilateral hazy airspace opacities, greatest within the left lower lung zone, findings that may reflect multifocal pneumonia or asymmetric edema.  The  patient was given IV Rocephin and Zithromax was 1 L bolus of IV normal saline and 10 mg of IV Decadron.  He will be admitted to a medical monitored bed for further evaluation and management.  PAST MEDICAL HISTORY:   Past Medical History:  Diagnosis Date  . Depression   . Hypertension   . Seizures ([place])   Obsessive-compulsive disorder Mental retardation Pervasive developmental disorder Intentional tremors History of pancytopenia PAST SURGICAL HISTORY:  History reviewed. No pertinent surgical history.  SOCIAL HISTORY:   Social History   Tobacco Use  . Smoking status: Not on file  Substance Use Topics  . Alcohol use: Not on file    FAMILY HISTORY:  No family history on file.  DRUG ALLERGIES:   Allergies  Allergen Reactions  . Dilantin [Phenytoin]   . Keppra [Levetiracetam]   . Lisinopril     REVIEW OF SYSTEMS:   ROS As per history of present illness. All pertinent systems were reviewed above. Constitutional,  HEENT, cardiovascular, respiratory, GI, GU, musculoskeletal, neuro, psychiatric, endocrine,  integumentary and hematologic systems were reviewed and are otherwise  negative/unremarkable except for positive findings mentioned above in the HPI.   MEDICATIONS AT HOME:   Prior to Admission medications   Medication Sig Start Date End Date Taking? Authorizing Provider  clonazePAM (KLONOPIN) 0.5 MG disintegrating tablet Take 0.5 mg by mouth 3 (three) times daily. 12/13/18   [provider]  cloNIDine (CATAPRES - DOSED IN MG/24 HR) 0.2 mg/24hr patch  Place 0.2 mg onto the skin once a week. 11/29/18   [provider]  lamoTRIgine (LAMICTAL) 100 MG tablet Take 100 mg by mouth 2 (two) times daily. 12/04/18   [provider]  lamoTRIgine (LAMICTAL) 25 MG tablet Take 75 mg by mouth at bedtime. Take in addition to 100 mg tablet for a total dose of 175 mg 12/04/18   [provider]      VITAL SIGNS:  Blood pressure (!) 134/105, pulse  74, temperature 100.1 F (37.8 C), temperature source Oral, resp. rate (!) 22, weight 67.1 kg, SpO2 98 %.  PHYSICAL EXAMINATION:  Physical Exam  GENERAL:  58 y.o.-year-old male patient lying in the bed in mild respiratory distress with conversational dyspnea. EYES: Pupils equal, round, reactive to light and accommodation. No scleral icterus. Extraocular muscles intact.  HEENT: Head atraumatic, normocephalic. Oropharynx and nasopharynx clear.  NECK:  Supple, no jugular venous distention. No thyroid enlargement, no tenderness.  LUNGS: Diminished bibasilar breath sounds with bibasal crackles. CARDIOVASCULAR: Regular rate and rhythm, S1, S2 normal. No murmurs, rubs, or gallops.  ABDOMEN: Soft, nondistended, nontender. Bowel sounds present. No organomegaly or mass.  EXTREMITIES: No pedal edema, cyanosis, or clubbing.  NEUROLOGIC: Cranial nerves II through XII are intact. Muscle strength 5/5 in all extremities. Sensation intact. Gait not checked.  PSYCHIATRIC: The patient is alert and oriented x 3.  Normal affect and good eye contact. SKIN: No obvious rash, lesion, or ulcer.   LABORATORY PANEL:   CBC Recent Labs  Lab 12/20/18 0016  WBC 4.5  HGB 10.4*  HCT 30.4*  PLT 106*   ------------------------------------------------------------------------------------------------------------------  Chemistries  Recent Labs  Lab 12/20/18 0016  NA 133*  K 4.7  CL 99  CO2 25  GLUCOSE 110*  BUN 26*  CREATININE 1.69*  CALCIUM 8.1*  AST 37  ALT 21  ALKPHOS 79  BILITOT 0.7   ------------------------------------------------------------------------------------------------------------------  Cardiac Enzymes No results for input(s): TROPONINI in the last 168 hours. ------------------------------------------------------------------------------------------------------------------  RADIOLOGY:  Dg Chest 2 View  Result Date: 12/19/2018 CLINICAL DATA:  Cough. EXAM: CHEST - 2 VIEW COMPARISON:   None FINDINGS: There is a dual chamber right-sided pacemaker in place. The heart size is borderline enlarged. There is no pneumothorax. No large pleural effusion. There is scattered hazy airspace opacities bilaterally, greatest within the left lower lung zone. There is no acute osseous abnormality. IMPRESSION: Bilateral hazy airspace opacities, greatest within the left lower lung zone. Findings may reflect multifocal pneumonia or asymmetric edema. Electronically Signed   By: Constance Holster M.D.   On: 12/19/2018 23:19    IMPRESSION AND PLAN:   1.  Multifocal pneumonia with associated hypoxemia. -The patient will be admitted to a medically monitored bed. -He will be placed on IV Rocephin and Zithromax.  Mucolytic therapy will be provided. -Sputum Gram stain culture and sensitivity will be obtained. -COVID-19 test is currently pending. -Given elevated D-dimer and LDH and fever in the setting of current pandemic, the patient has been placed on contact and droplet isolation. -Chest CTA can be obtained in a.m. with improvement of renal functions with hydration. -We will place the patient on aspirin for now. -We will continue steroid therapy with IV Decadron pending CRP level. -O2 protocol will be followed.  2.  Acute kidney injury.  This is likely prerenal.  The patient will be hydrated with IV normal saline and follow his BMP.  3.  Chronic iron deficiency anemia.  There may be mild exacerbation with acute on chronic anemia.  We will continue iron for now and obtain stool Hemoccults as well as anemia work-up.  4.  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  His Klonopin, Lamictal and Seroquel will be continued.  5.  Seizure disorder.  His Lamictal will be continued and he will be placed on seizure precautions and as needed IV Ativan.  6.  Hypertension.  Norvasc will be resumed with holding parameters.  76.  DVT prophylaxis.  Subcutaneous Lovenox.    All the records are reviewed and case discussed with ED provider. The plan of care was discussed in details with the patient.  I answered all questions. The patient agreed to proceed with the above mentioned plan. Further management will depend upon hospital course.   CODE STATUS: Full code  TOTAL TIME TAKING CARE OF THIS PATIENT: 55 minutes.    Christel Mormon M.D on 12/20/2018 at 1:56 AM  [facility]   From 7 PM-7 AM, contact night-coverage [email or web address]  CC: Primary care physician; Samuel Bouche, MD   Note: This dictation was prepared with Dragon dictation along with smaller phrase technology. Any transcriptional errors that result from this process are unintentional.

## 2018-12-20 NOTE — ED Notes (Signed)
This RN explained to pt that another pt's transport was placed ahead of his due to it being a medical emergency.

## 2018-12-20 NOTE — ED Notes (Signed)
U Bag placed for urine collection with verbal from EDP.

## 2018-12-20 NOTE — ED Notes (Addendum)
Contacted legal guardian Marshel Rolnick and updated her on transfer to [facility]. She verbalized permission for the transfer. She will contact [facility] in the morning for a status update.

## 2018-12-20 NOTE — Progress Notes (Signed)
CODE SEPSIS - PHARMACY COMMUNICATION  **Broad Spectrum Antibiotics should be administered within 1 hour of Sepsis diagnosis**  Time Code Sepsis Called/Page Received: 0007  Antibiotics Ordered: Rocephin and Zithromax  Time of 1st antibiotic administration: 0051  Additional action taken by pharmacy: n/a  If necessary, Name of Provider/Nurse Contacted: n/a    Ena Dawley ,PharmD Clinical Pharmacist  12/20/2018  12:34 AM

## 2018-12-21 ENCOUNTER — Other Ambulatory Visit: Payer: Self-pay

## 2018-12-21 ENCOUNTER — Encounter (HOSPITAL_COMMUNITY): Payer: Self-pay

## 2018-12-21 DIAGNOSIS — I10 Essential (primary) hypertension: Secondary | ICD-10-CM

## 2018-12-21 DIAGNOSIS — N179 Acute kidney failure, unspecified: Secondary | ICD-10-CM

## 2018-12-21 DIAGNOSIS — E871 Hypo-osmolality and hyponatremia: Secondary | ICD-10-CM

## 2018-12-21 DIAGNOSIS — U071 COVID-19: Principal | ICD-10-CM

## 2018-12-21 DIAGNOSIS — D649 Anemia, unspecified: Secondary | ICD-10-CM

## 2018-12-21 DIAGNOSIS — J1289 Other viral pneumonia: Secondary | ICD-10-CM

## 2018-12-21 DIAGNOSIS — N183 Chronic kidney disease, stage 3 unspecified: Secondary | ICD-10-CM

## 2018-12-21 LAB — HEMOGLOBIN A1C
Hgb A1c MFr Bld: 5 % (ref 4.8–5.6)
Mean Plasma Glucose: 96.8 mg/dL

## 2018-12-21 LAB — COMPREHENSIVE METABOLIC PANEL
ALT: 21 U/L (ref 0–44)
AST: 34 U/L (ref 15–41)
Albumin: 2.8 g/dL — ABNORMAL LOW (ref 3.5–5.0)
Alkaline Phosphatase: 68 U/L (ref 38–126)
Anion gap: 10 (ref 5–15)
BUN: 27 mg/dL — ABNORMAL HIGH (ref 6–20)
CO2: 21 mmol/L — ABNORMAL LOW (ref 22–32)
Calcium: 7.2 mg/dL — ABNORMAL LOW (ref 8.9–10.3)
Chloride: 102 mmol/L (ref 98–111)
Creatinine, Ser: 0.97 mg/dL (ref 0.61–1.24)
GFR calc Af Amer: >60 mL/min (ref >60)
GFR calc non Af Amer: >60 mL/min (ref >60)
Glucose, Bld: 189 mg/dL — ABNORMAL HIGH (ref 70–99)
Potassium: 4 mmol/L (ref 3.5–5.1)
Sodium: 133 mmol/L — ABNORMAL LOW (ref 135–145)
Total Bilirubin: 0.3 mg/dL (ref 0.3–1.2)
Total Protein: 5.7 g/dL — ABNORMAL LOW (ref 6.5–8.1)

## 2018-12-21 LAB — GLUCOSE, CAPILLARY
Glucose-Capillary: 198 mg/dL — ABNORMAL HIGH (ref 70–99)
Glucose-Capillary: 219 mg/dL — ABNORMAL HIGH (ref 70–99)
Glucose-Capillary: 114 mg/dL — ABNORMAL HIGH (ref 70–99)
Glucose-Capillary: 180 mg/dL — ABNORMAL HIGH (ref 70–99)

## 2018-12-21 LAB — C-REACTIVE PROTEIN: CRP: 16.9 mg/dL — ABNORMAL HIGH (ref <1.0)

## 2018-12-21 LAB — CBC WITH DIFFERENTIAL/PLATELET
Abs Immature Granulocytes: 0.02 10*3/uL (ref 0.00–0.07)
Basophils Absolute: 0 10*3/uL (ref 0.0–0.1)
Basophils Relative: 0 %
Eosinophils Absolute: 0 10*3/uL (ref 0.0–0.5)
Eosinophils Relative: 0 %
HCT: 30.4 % — ABNORMAL LOW (ref 39.0–52.0)
Hemoglobin: 10.4 g/dL — ABNORMAL LOW (ref 13.0–17.0)
Immature Granulocytes: 1 %
Lymphocytes Relative: 8 %
Lymphs Abs: 0.4 10*3/uL — ABNORMAL LOW (ref 0.7–4.0)
MCH: 32.3 pg (ref 26.0–34.0)
MCHC: 34.2 g/dL (ref 30.0–36.0)
MCV: 94.4 fL (ref 80.0–100.0)
Monocytes Absolute: 0.3 10*3/uL (ref 0.1–1.0)
Monocytes Relative: 8 %
Neutro Abs: 3.7 10*3/uL (ref 1.7–7.7)
Neutrophils Relative %: 83 %
Platelets: 117 10*3/uL — ABNORMAL LOW (ref 150–400)
RBC: 3.22 MIL/uL — ABNORMAL LOW (ref 4.22–5.81)
RDW: 13.5 % (ref 11.5–15.5)
WBC: 4.4 10*3/uL (ref 4.0–10.5)
nRBC: 0 % (ref 0.0–0.2)

## 2018-12-21 LAB — URINE CULTURE: Culture: NO GROWTH

## 2018-12-21 LAB — D-DIMER, QUANTITATIVE: D-Dimer, Quant: 1.14 ug/mL-FEU — ABNORMAL HIGH (ref 0.00–0.50)

## 2018-12-21 LAB — ABO/RH: ABO/RH(D): B NEG

## 2018-12-21 MED ORDER — INSULIN ASPART 100 UNIT/ML ~~LOC~~ SOLN
0.0000 [IU] | Freq: Three times a day (TID) | SUBCUTANEOUS | Status: DC
  Administered 2018-12-21: 7 [IU] via SUBCUTANEOUS
  Administered 2018-12-22 (×2): 4 [IU] via SUBCUTANEOUS
  Administered 2018-12-22 – 2018-12-23 (×2): 3 [IU] via SUBCUTANEOUS
  Administered 2018-12-23: 4 [IU] via SUBCUTANEOUS
  Administered 2018-12-24: 11 [IU] via SUBCUTANEOUS

## 2018-12-21 MED ORDER — INSULIN ASPART 100 UNIT/ML ~~LOC~~ SOLN: 0.0000 [IU] | Freq: Every day | SUBCUTANEOUS | Status: DC

## 2018-12-21 MED ORDER — PANTOPRAZOLE SODIUM 40 MG PO TBEC
40.0000 mg | DELAYED_RELEASE_TABLET | Freq: Two times a day (BID) | ORAL | Status: DC
  Administered 2018-12-21 – 2018-12-24 (×7): 40 mg via ORAL
  Filled 2018-12-21 (×6): qty 1

## 2018-12-21 MED ORDER — SODIUM CHLORIDE 0.9 % IV SOLN
1.0000 g | INTRAVENOUS | Status: AC
  Administered 2018-12-21 – 2018-12-24 (×4): 1 g via INTRAVENOUS
  Filled 2018-12-21 (×4): qty 10

## 2018-12-21 MED ORDER — SODIUM CHLORIDE 0.9 % IV SOLN
500.0000 mg | INTRAVENOUS | Status: AC
  Administered 2018-12-21 – 2018-12-24 (×4): 500 mg via INTRAVENOUS
  Filled 2018-12-21 (×5): qty 500

## 2018-12-21 MED ORDER — RESOURCE THICKENUP CLEAR PO POWD
ORAL | Status: DC | PRN
  Filled 2018-12-21: qty 125

## 2018-12-21 NOTE — Evaluation (Signed)
Clinical/Bedside Swallow Evaluation Patient Details  Name: KADO CARITHERS MRN: [id number] Date of Birth: 01-22-1961  Today's Date: 12/21/2018 Time: SLP Start Time (ACUTE ONLY): 76 SLP Stop Time (ACUTE ONLY): 1304 SLP Time Calculation (min) (ACUTE ONLY): 34 min  Past Medical History:  Past Medical History:  Diagnosis Date  . Depression   . Hypertension   . MGUS (monoclonal gammopathy of unknown significance)   . Pervasive developmental disorder   . Seizures ([place])   . SSS (sick sinus syndrome) ([facility])    PPM placed   Past Surgical History:  Past Surgical History:  Procedure Laterality Date  . PACEMAKER IMPLANT     HPI:  58 year old male with a past medical history of mental disability who lives in a group home, also has a history of essential hypertension, seizure disorder, MGUS, sick sinus syndrome status post pacemaker who presented with cough shortness of breath.  Was seen by his primary care provider and a swab was sent for COVID-19.  It came back positive.  Patient was sent over to the emergency department.  Chest x-ray showed multifocal pneumonia.    Assessment / Plan / Recommendation Clinical Impression  Pt demonstrates signs of a potential chronic or acutely exacerbated dysphagia. Checked in the staff of his group home who reported that he is an impulsive eater, that they given him finely chopped food and supervision. She was not aware of any difficulty with thin liquids or signs of aspiration except for a slight behavior of soft coughing at times. Pt was noted to do this today and typically it was a soft almost throat clear after each bite and sip. The behavior ceased as pt started self feeding, but then after further sips of thin liquids pt had true coughing. When self feeding thick liquids pt seemed to have better oral control of the bolus. But coughing increased in frequency by the end of the meal. Given his potential for unsafe eating behavior, mild dysphagia and limited  capacity for supervision for safety will down grade diet to puree and honey thick liquids and f/u for tolerance. He may benefit from [facility] while admitted as there is potential for risk of aspiration here. Marland Kitchen  SLP Visit Diagnosis: Dysphagia, oropharyngeal phase (R13.12)    Aspiration Risk  Moderate aspiration risk    Diet Recommendation Dysphagia 1 (Puree);Nectar-thick liquid   Liquid Administration via: Cup Medication Administration: Crushed with puree Supervision: Full supervision/cueing for compensatory strategies Compensations: Slow rate;Small sips/bites Postural Changes: Seated upright at 90 degrees    Other  Recommendations Oral Care Recommendations: Oral care BID   Follow up Recommendations        Frequency and Duration min 2x/week  2 weeks       Prognosis        Swallow Study   General HPI: 58 year old male with a past medical history of mental disability who lives in a group home, also has a history of essential hypertension, seizure disorder, MGUS, sick sinus syndrome status post pacemaker who presented with cough shortness of breath.  Was seen by his primary care provider and a swab was sent for COVID-19.  It came back positive.  Patient was sent over to the emergency department.  Chest x-ray showed multifocal pneumonia.  Type of Study: Bedside Swallow Evaluation Previous Swallow Assessment: none Diet Prior to this Study: Thin liquids;Dysphagia 3 (soft) Temperature Spikes Noted: No Respiratory Status: Room air History of Recent Intubation: No Behavior/Cognition: Alert;Cooperative;Pleasant mood Oral Cavity Assessment: Within Functional Limits Oral Care Completed  by SLP: No Oral Cavity - Dentition: Adequate natural dentition Vision: Functional for self-feeding Self-Feeding Abilities: Able to feed self Patient Positioning: Upright in bed Baseline Vocal Quality: Normal Volitional Cough: Weak Volitional Swallow: Able to elicit    Oral/Motor/Sensory Function Overall  Oral Motor/Sensory Function: Within functional limits   Ice Chips     Thin Liquid Thin Liquid: Impaired Presentation: Cup;Self Fed Pharyngeal  Phase Impairments: Throat Clearing - Immediate    Nectar Thick Nectar Thick Liquid: Within functional limits Presentation: Cup;Self Fed   Honey Thick Honey Thick Liquid: Not tested   Puree Puree: Within functional limits Presentation: Self Fed;Spoon   Solid     Solid: Within functional limits(impulsive, overloads spoon and mouth) Presentation: [place] Randilyn Foisy, MA [facility] Pager [phone] Office [phone]  Lynann Beaver 12/21/2018,1:05 PM

## 2018-12-21 NOTE — Progress Notes (Addendum)
PROGRESS NOTE  Frank Conley [id number] DOB: 02-Mar-1960 DOA: 12/20/2018  PCP: Samuel Bouche, MD  Brief History/Interval Summary: 58 year old male with a past medical history of mental disability who lives in a group home, also has a history of essential hypertension, seizure disorder, MGUS, sick sinus syndrome status post pacemaker who presented with cough shortness of breath.  Was seen by his primary care provider and a swab was sent for COVID-19.  It came back positive.  Patient was sent over to the emergency department.  Chest x-ray showed multifocal pneumonia.  Patient was hospitalized for further management.   Reason for Visit: Pneumonia due to COVID-19.  Consultants: None  Procedures: None  Antibiotics: Anti-infectives (From admission, onward)   Start     Dose/Rate Route Frequency Ordered Stop   12/22/18 1000  remdesivir 100 mg in sodium chloride 0.9 % 250 mL IVPB     100 mg 500 mL/hr over 30 Minutes Intravenous Every 24 hours 12/20/18 2202 12/25/18 0959   12/21/18 1600  remdesivir 100 mg in sodium chloride 0.9 % 250 mL IVPB     100 mg 500 mL/hr over 30 Minutes Intravenous Every 24 hours 12/20/18 2202 12/22/18 1559      Subjective/Interval History: Patient noted to be coughing while drinking fluids.  Poor historian primarily due to his mental disability.  Per nursing staff overnight he had black stool this morning.  He is noted to be on iron tablets.    Assessment/Plan:  Pneumonia due to COVID-19/acute respiratory failure with hypoxia  COVID-19 Labs  Recent Labs    12/20/18 0016 12/21/18 0124  DDIMER  --  1.14*  FERRITIN 334  --   LDH 220*  --   CRP 23.1* 16.9*    Lab Results  Component Value Date   SARSCOV2NAA POSITIVE (A) 12/20/2018     Fever: Temperature was 100.1 yesterday.  No fever since then. Oxygen requirements: He was on nasal cannula at 1 to 2 L.  Has been weaned down to room air. Antibacterials: Not on antibacterials Remdesivir:  Day 2 today Steroids: Dexamethasone 6 mg daily Diuretics: Not on diuretics on a scheduled basis Actemra: Since he is improving not a candidate for Actemra Vitamin C and Zinc: Will order DVT Prophylaxis: Lovenox to be held due to concern for GI bleed  Patient's respiratory status is stable.  He has been weaned off of oxygen.  He remains on remdesivir and steroids.  His CRP was elevated at 23.1 yesterday.  Improved to 16.9.  D-dimer 1.14.  Since he appears to be improving we can hold off on Actemra.  Continue with the DVT prophylaxis.  Hold off on convalescent plasma as well.  Procalcitonin was 1.09.  He lives in a group home setting.  We will place him on antibacterials as well.  He did get a dose of ceftriaxone and azithromycin at the time of admission.  WBC is normal.  No DVT noted on lower extremity Doppler study.  Normocytic anemia Hemoglobin is stable.  Check anemia panel.  Questionable black stool Noted to be on iron supplements.  Will order stool for occult blood.  No obvious evidence for GI bleed noted.  We will put him on PPI for now.  Hold his Lovenox for now.  Hyponatremia Monitor sodium levels closely.  Likely due to hypovolemia.  He is on IV fluids which should be discontinued sometime later today.  Acute kidney injury on chronic kidney disease stage III Creatinine has improved.  Seems to be  at baseline currently.  Monitor urine output.  Hyperglycemia No documented history of diabetes.  Most likely due to steroids.  Check HbA1c.  SSI for now.  History of seizure disorder Continue Lamictal  Essential hypertension Noted to be on clonidine patch and amlodipine which is being continued.  Monitor blood pressures closely.  Developmental disability Continue Seroquel and Klonopin.  He lives in a group home setting.  Sick sinus syndrome status post pacemaker Last pacemaker check was in October.  Mild thrombocytopenia Stable.  Continue to monitor.  Concern for dysphagia and  aspiration Speech therapy consult.  ADDENDUM Discussed with patient's brother and sister-in-law both of whom are his legal guardians.  They were updated on patient's condition.  He also told me that patient is a DNR.  CODE STATUS will be changed.   DVT Prophylaxis: SCDs for now PUD Prophylaxis: PPI Code Status: DNR Family Communication: Discussed with patient we will try to call his family today. Disposition Plan: Management as outlined above.  Mobilize.  Swallow evaluation.     Medications:  Scheduled: . amLODipine  10 mg Oral Daily  . clonazePAM  0.5 mg Oral TID  . cloNIDine  0.2 mg Transdermal Weekly  . dexamethasone  6 mg Intravenous Q24H  . enoxaparin (LOVENOX) injection  40 mg Subcutaneous Q24H  . ferrous sulfate  325 mg Oral QAC breakfast  . guaiFENesin  600 mg Oral BID  . lamoTRIgine  100 mg Oral Daily  . lamoTRIgine  175 mg Oral QHS  . loratadine  10 mg Oral Daily  . Melatonin  3 mg Oral QHS  . multivitamin  1 tablet Oral Daily  . pantoprazole  40 mg Oral BID  . polyethylene glycol  17 g Oral BID  . QUEtiapine  150 mg Oral TID   Continuous: . sodium chloride 75 mL/hr at 12/21/18 0600  . remdesivir 100 mg in NS 250 mL     Followed by  . [START ON 12/22/2018] remdesivir 100 mg in NS 250 mL     [id number], chlorpheniramine-HYDROcodone, guaiFENesin-dextromethorphan, ondansetron **OR** ondansetron (ZOFRAN) IV, QUEtiapine, senna   Objective:  Vital Signs  Vitals:   12/20/18 2234 12/21/18 0610 12/21/18 0900  BP: 133/88 111/79 135/83  Pulse: 60 60 62  Resp: (!) 21 20 (!) 22  Temp: (!) 97.5 F (36.4 C) (!) 97.2 F (36.2 C) (!) 97.2 F (36.2 C)  TempSrc: Oral Oral Oral  SpO2: 96% 97% 97%  Weight: 82.3 kg    Height: 5\' 11"  (1.803 m)      Intake/Output Summary (Last 24 hours) at 12/21/2018 1048 Last data filed at 12/21/2018 0600 Gross per 24 hour  Intake 1283.02 ml  Output 1300 ml  Net -16.98 ml   Filed Weights   12/20/18 2234  Weight: 82.3 kg     General appearance: Awake alert.  Distracted. Resp: Mildly tachypneic at rest.  Coarse breath sounds bilaterally with crackles at the bases.  No wheezing or rhonchi. Cardio: S1-S2 is normal regular.  No S3-S4.  No rubs murmurs or bruit GI: Abdomen is slightly tense.  Nontender.  Bowel sounds are present.  No masses organomegaly. Extremities: No edema.  Full range of motion of lower extremities. Neurologic: Distracted.  No obvious focal deficits.   Lab Results:  Data Reviewed: I have personally reviewed following labs and imaging studies  CBC: Recent Labs  Lab 12/20/18 0016 12/20/18 0535 12/21/18 0124  WBC 4.5 4.7 4.4  NEUTROABS 3.6  --  3.7  HGB 10.4* 10.2* 10.4*  HCT 30.4* 29.0* 30.4*  MCV 93.8 90.9 94.4  PLT 106* 103* 117*    Basic Metabolic Panel: Recent Labs  Lab 12/20/18 0016 12/20/18 0535 12/21/18 0124  NA 133* 130* 133*  K 4.7 4.7 4.0  CL 99 99 102  CO2 25 23 21*  GLUCOSE 110* 128* 189*  BUN 26* 24* 27*  CREATININE 1.69* 1.37* 0.97  CALCIUM 8.1* 7.4* 7.2*    GFR: Estimated Creatinine Clearance: 90.6 mL/min (by C-G formula based on SCr of 0.97 mg/dL).  Liver Function Tests: Recent Labs  Lab 12/20/18 0016 12/21/18 0124  AST 37 34  ALT 21 21  ALKPHOS 79 68  BILITOT 0.7 0.3  PROT 6.7 5.7*  ALBUMIN 3.5 2.8*    Recent Labs  Lab 12/20/18 0016  LIPASE 24   Coagulation Profile: Recent Labs  Lab 12/20/18 0016  INR 1.1    CBG: Recent Labs  Lab 12/20/18 2248 12/21/18 0737  GLUCAP 173* 198*    Lipid Profile: Recent Labs    12/20/18 0016  TRIG 41    Anemia Panel: Recent Labs    12/20/18 0016  FERRITIN 334    Recent Results (from the past 240 hour(s))  Blood Culture (routine x 2)     Status: None (Preliminary result)   Collection Time: 12/20/18 12:12 AM   Specimen: BLOOD  Result Value Ref Range Status   Specimen Description BLOOD BLOOD RIGHT HAND  Final   Special Requests   Final    BOTTLES DRAWN AEROBIC AND ANAEROBIC  Blood Culture results may not be optimal due to an excessive volume of blood received in culture bottles   Culture   Final    NO GROWTH 1 DAY Performed at [facility], [address] [place], [place] [address]    Report Status PENDING  Incomplete  SARS CORONAVIRUS 2 (TAT 6-24 HRS) Nasopharyngeal Nasopharyngeal Swab     Status: Abnormal   Collection Time: 12/20/18 12:16 AM   Specimen: Nasopharyngeal Swab  Result Value Ref Range Status   SARS Coronavirus 2 POSITIVE (A) NEGATIVE Final    Comment: RESULT CALLED TO, READ BACK BY AND VERIFIED WITH: L. GANNON, [facility]) AT 1410 ON 12/20/18 BY C. JESSUP, MT. (NOTE) SARS-CoV-2 target nucleic acids are DETECTED. The SARS-CoV-2 RNA is generally detectable in upper and lower respiratory specimens during the acute phase of infection. Positive results are indicative of active infection with SARS-CoV-2. Clinical  correlation with patient history and other diagnostic information is necessary to determine patient infection status. Positive results do  not rule out bacterial infection or co-infection with other viruses. The expected result is Negative. Fact Sheet for Patients: SugarRoll.be Fact Sheet for Healthcare Providers: https://www.woods-mathews.com/ This test is not yet approved or cleared by the [place] FDA and  has been authorized for detection and/or diagnosis of SARS-CoV-2 by FDA under an Emergency Use Authorization (EUA). This EUA will remain  in effect (meaning this  test can be used) for the duration of the COVID-19 declaration under Section 564(b)(1) of the Act, 21 U.S.C. section 360bbb-3(b)(1), unless the authorization is terminated or revoked sooner. Performed at [facility], [place] [address] [place], [place] [address]   Blood Culture (routine x 2)     Status: None (Preliminary result)   Collection Time: 12/20/18 12:30 AM   Specimen: BLOOD  Result Value Ref Range  Status   Specimen Description BLOOD RIGHT ANTECUBITAL  Final   Special Requests   Final    BOTTLES DRAWN  AEROBIC AND ANAEROBIC Blood Culture results may not be optimal due to an excessive volume of blood received in culture bottles   Culture   Final    NO GROWTH 1 DAY Performed at [facility], [address] [place], [place] [address]    Report Status PENDING  Incomplete  Urine culture     Status: None   Collection Time: 12/20/18  3:01 AM   Specimen: In/Out Cath Urine  Result Value Ref Range Status   Specimen Description   Final    IN/OUT CATH URINE Performed at [facility], [address], [place], [place] [address]    Special Requests   Final    NONE Performed at [facility], [address], [place], [place] [address]    Culture   Final    NO GROWTH Performed at [facility], [place] [address] [place], [place] [address]    Report Status 12/21/2018 FINAL  Final      Radiology Studies: Dg Chest 2 View  Result Date: 12/19/2018 CLINICAL DATA:  Cough. 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 The heart size is borderline enlarged. There is no pneumothorax. No large pleural effusion. There is scattered hazy airspace opacities bilaterally, greatest within the left lower lung zone. There is no acute osseous abnormality. IMPRESSION: Bilateral hazy airspace opacities, greatest within the left lower lung zone. Findings may reflect multifocal pneumonia or asymmetric edema. Electronically Signed   By: Constance Holster M.D.   On: 12/19/2018 23:19   US Venous Img Lower Bilateral (dvt)  Result Date: 12/20/2018 CLINICAL DATA:  Elevated D-dimer.  Evaluate for DVT. EXAM: BILATERAL LOWER EXTREMITY VENOUS DOPPLER ULTRASOUND TECHNIQUE: Gray-scale sonography with graded compression, as well as color Doppler and duplex ultrasound were performed to evaluate the lower extremity deep venous systems  from the level of the common femoral vein and including the common femoral, femoral, profunda femoral, popliteal and calf veins including the posterior tibial, peroneal and gastrocnemius veins when visible. The superficial great saphenous vein was also interrogated. Spectral Doppler was utilized to evaluate flow at rest and with distal augmentation maneuvers in the common femoral, femoral and popliteal veins. COMPARISON:  Left lower extremity venous doppler ultrasound - 09/21/2012 FINDINGS: RIGHT LOWER EXTREMITY Common Femoral Vein: No evidence of thrombus. Normal compressibility, respiratory phasicity and response to augmentation. Saphenofemoral Junction: No evidence of thrombus. Normal compressibility and flow on color Doppler imaging. Profunda Femoral Vein: No evidence of thrombus. Normal compressibility and flow on color Doppler imaging. Femoral Vein: No evidence of thrombus. Normal compressibility, respiratory phasicity and response to augmentation. Popliteal Vein: No evidence of thrombus. Normal compressibility, respiratory phasicity and response to augmentation. Calf Veins: No evidence of thrombus. Normal compressibility and flow on color Doppler imaging. Superficial Great Saphenous Vein: No evidence of thrombus. Normal compressibility. Venous Reflux:  None. Other Findings:  None. LEFT LOWER EXTREMITY Common Femoral Vein: No evidence of thrombus. Normal compressibility, respiratory phasicity and response to augmentation. Saphenofemoral Junction: No evidence of thrombus. Normal compressibility and flow on color Doppler imaging. Profunda Femoral Vein: No evidence of thrombus. Normal compressibility and flow on color Doppler imaging. Femoral Vein: No evidence of thrombus. Normal compressibility, respiratory phasicity and response to augmentation. Popliteal Vein: No evidence of thrombus. Normal compressibility, respiratory phasicity and response to augmentation. Calf Veins: No evidence of thrombus. Normal  compressibility and flow on color Doppler imaging. Superficial Great Saphenous Vein: No evidence of thrombus.  Normal compressibility. Venous Reflux:  None. Other Findings:  None. IMPRESSION: No evidence of deep venous thrombosis within either lower extremity. Electronically Signed   By: Sandi Mariscal M.D.   On: 12/20/2018 07:13       LOS: 1 day   [facility] Pager on [email or web address]  12/21/2018, 10:48 AM

## 2018-12-22 ENCOUNTER — Inpatient Hospital Stay (HOSPITAL_COMMUNITY): Payer: Medicare Other

## 2018-12-22 LAB — COMPREHENSIVE METABOLIC PANEL
ALT: 18 U/L (ref 0–44)
AST: 27 U/L (ref 15–41)
Albumin: 2.6 g/dL — ABNORMAL LOW (ref 3.5–5.0)
Alkaline Phosphatase: 72 U/L (ref 38–126)
Anion gap: 7 (ref 5–15)
BUN: 34 mg/dL — ABNORMAL HIGH (ref 6–20)
CO2: 20 mmol/L — ABNORMAL LOW (ref 22–32)
Calcium: 7.5 mg/dL — ABNORMAL LOW (ref 8.9–10.3)
Chloride: 107 mmol/L (ref 98–111)
Creatinine, Ser: 0.99 mg/dL (ref 0.61–1.24)
GFR calc Af Amer: >60 mL/min (ref >60)
GFR calc non Af Amer: >60 mL/min (ref >60)
Glucose, Bld: 163 mg/dL — ABNORMAL HIGH (ref 70–99)
Potassium: 4.4 mmol/L (ref 3.5–5.1)
Sodium: 134 mmol/L — ABNORMAL LOW (ref 135–145)
Total Bilirubin: 0.3 mg/dL (ref 0.3–1.2)
Total Protein: 5.5 g/dL — ABNORMAL LOW (ref 6.5–8.1)

## 2018-12-22 LAB — IRON AND TIBC
Iron: 52 ug/dL (ref 45–182)
Saturation Ratios: 35 % (ref 17.9–39.5)
TIBC: 148 ug/dL — ABNORMAL LOW (ref 250–450)
UIBC: 96 ug/dL

## 2018-12-22 LAB — FOLATE: Folate: 11.6 ng/mL (ref >5.9)

## 2018-12-22 LAB — CBC WITH DIFFERENTIAL/PLATELET
Abs Immature Granulocytes: 0.07 10*3/uL (ref 0.00–0.07)
Basophils Absolute: 0 10*3/uL (ref 0.0–0.1)
Basophils Relative: 0 %
Eosinophils Absolute: 0 10*3/uL (ref 0.0–0.5)
Eosinophils Relative: 0 %
HCT: 32.9 % — ABNORMAL LOW (ref 39.0–52.0)
Hemoglobin: 11 g/dL — ABNORMAL LOW (ref 13.0–17.0)
Immature Granulocytes: 1 %
Lymphocytes Relative: 9 %
Lymphs Abs: 0.5 10*3/uL — ABNORMAL LOW (ref 0.7–4.0)
MCH: 31.9 pg (ref 26.0–34.0)
MCHC: 33.4 g/dL (ref 30.0–36.0)
MCV: 95.4 fL (ref 80.0–100.0)
Monocytes Absolute: 0.5 10*3/uL (ref 0.1–1.0)
Monocytes Relative: 8 %
Neutro Abs: 4.9 10*3/uL (ref 1.7–7.7)
Neutrophils Relative %: 82 %
Platelets: 176 10*3/uL (ref 150–400)
RBC: 3.45 MIL/uL — ABNORMAL LOW (ref 4.22–5.81)
RDW: 14.2 % (ref 11.5–15.5)
WBC: 6 10*3/uL (ref 4.0–10.5)
nRBC: 0 % (ref 0.0–0.2)

## 2018-12-22 LAB — HEMOGLOBIN A1C
Hgb A1c MFr Bld: 5.1 % (ref 4.8–5.6)
Mean Plasma Glucose: 99.67 mg/dL

## 2018-12-22 LAB — RETICULOCYTES
Immature Retic Fract: 15.1 % (ref 2.3–15.9)
RBC.: 3.45 MIL/uL — ABNORMAL LOW (ref 4.22–5.81)
Retic Count, Absolute: 19.7 10*3/uL (ref 19.0–186.0)
Retic Ct Pct: 0.6 % (ref 0.4–3.1)

## 2018-12-22 LAB — C-REACTIVE PROTEIN: CRP: 8.8 mg/dL — ABNORMAL HIGH (ref <1.0)

## 2018-12-22 LAB — FERRITIN: Ferritin: 456 ng/mL — ABNORMAL HIGH (ref 24–336)

## 2018-12-22 LAB — PROCALCITONIN: Procalcitonin: 0.41 ng/mL

## 2018-12-22 LAB — GLUCOSE, CAPILLARY
Glucose-Capillary: 128 mg/dL — ABNORMAL HIGH (ref 70–99)
Glucose-Capillary: 158 mg/dL — ABNORMAL HIGH (ref 70–99)

## 2018-12-22 LAB — D-DIMER, QUANTITATIVE: D-Dimer, Quant: 0.92 ug/mL-FEU — ABNORMAL HIGH (ref 0.00–0.50)

## 2018-12-22 LAB — VITAMIN B12: Vitamin B-12: 1244 pg/mL — ABNORMAL HIGH (ref 180–914)

## 2018-12-22 MED ORDER — VITAMIN C 500 MG PO TABS
500.0000 mg | ORAL_TABLET | Freq: Every day | ORAL | Status: DC
  Administered 2018-12-22 – 2018-12-24 (×3): 500 mg via ORAL
  Filled 2018-12-22 (×3): qty 1

## 2018-12-22 MED ORDER — ZINC SULFATE 220 (50 ZN) MG PO CAPS
220.0000 mg | ORAL_CAPSULE | Freq: Every day | ORAL | Status: DC
  Administered 2018-12-22 – 2018-12-24 (×3): 220 mg via ORAL
  Filled 2018-12-22 (×3): qty 1

## 2018-12-22 NOTE — Progress Notes (Signed)
PROGRESS NOTE  Frank Conley [id number] DOB: 1960-02-18 DOA: 12/20/2018  PCP: Samuel Bouche, MD  Brief History/Interval Summary: 58 year old male with a past medical history of mental disability who lives in a group home, also has a history of essential hypertension, seizure disorder, MGUS, sick sinus syndrome status post pacemaker who presented with cough shortness of breath.  Was seen by his primary care provider and a swab was sent for COVID-19.  It came back positive.  Patient was sent over to the emergency department.  Chest x-ray showed multifocal pneumonia.  Patient was hospitalized for further management.   Reason for Visit: Pneumonia due to COVID-19.  Consultants: None  Procedures: None  Antibiotics: Anti-infectives (From admission, onward)   Start     Dose/Rate Route Frequency Ordered Stop   12/22/18 1000  remdesivir 100 mg in sodium chloride 0.9 % 250 mL IVPB     100 mg 500 mL/hr over 30 Minutes Intravenous Every 24 hours 12/20/18 2202 12/25/18 0959   12/21/18 1600  remdesivir 100 mg in sodium chloride 0.9 % 250 mL IVPB     100 mg 500 mL/hr over 30 Minutes Intravenous Every 24 hours 12/20/18 2202 12/21/18 1800   12/21/18 1200  azithromycin (ZITHROMAX) 500 mg in sodium chloride 0.9 % 250 mL IVPB     500 mg 250 mL/hr over 60 Minutes Intravenous Every 24 hours 12/21/18 1101 12/25/18 1159   12/21/18 1115  cefTRIAXone (ROCEPHIN) 1 g in sodium chloride 0.9 % 100 mL IVPB     1 g 200 mL/hr over 30 Minutes Intravenous Every 24 hours 12/21/18 1101 12/25/18 1114      Subjective/Interval History: Sometimes difficult to comprehend what the patient is saying.  This is due to his mental disability.  Does not appear to be in any discomfort.  No overnight issues per nursing staff.    Assessment/Plan:  Pneumonia due to COVID-19  COVID-19 Labs  Recent Labs    12/20/18 0016 12/21/18 0124 12/22/18 0352  DDIMER  --  1.14* 0.92*  FERRITIN 334  --  456*  LDH 220*  --    --   CRP 23.1* 16.9* 8.8*    Lab Results  Component Value Date   SARSCOV2NAA POSITIVE (A) 12/20/2018     Fever: Afebrile in the last 24 hours Oxygen requirements: He has been weaned off of oxygen.  Saturating on room air now.  In the 90s.   Antibacterials: Ceftriaxone and azithromycin day 3 Remdesivir: Day 3 Steroids: Dexamethasone 6 mg daily Diuretics: Not on diuretics on a scheduled basis Actemra: Not given due to elevated procalcitonin Vitamin C and Zinc: Continue DVT Prophylaxis: Lovenox held due to concern for GI bleed.  SCDs  Patient's respiratory status is stable.  His presentation likely due to a combination of COVID-19 as well as aspiration.  Inflammatory marker was elevated with CRP at 23.1.  Improved to 8.8 today.  Ferritin is 456.  D-dimer 0.92.  Patient is saturating normal on room air.  Acute respiratory failure appears to have resolved.  Due to elevated procalcitonin he was placed on ceftriaxone and azithromycin as well.  There is concern for aspiration however we will hold off on changing the antibiotics at this time.  His WBC has been normal.  No DVT noted on lower extremity Doppler study.  Normocytic anemia Hemoglobin is stable.  Anemia panel reviewed.  No clear-cut deficiencies identified.  Questionable black stool Had brown bowel movement yesterday.  His hemoglobin is stable.  Dark stools  likely due to iron supplementation.  Continue PPI for now.  Continue to hold Lovenox for now.    Hyponatremia Sodium level is slightly improved.  Continue to monitor.    Acute kidney injury on chronic kidney disease stage III Renal function is back to normal.  Monitor urine output.  IV fluids were discontinued.    Hyperglycemia No documented history of diabetes.  Elevated CBG most likely due to steroids.  HbA1c is 5.1.  SSI.  History of seizure disorder Continue Lamictal  Essential hypertension Noted to be on clonidine patch and amlodipine which is being continued.   Blood pressure is reasonably well controlled.  Developmental disability Continue Seroquel and Klonopin.  He lives in a group home setting.  Sick sinus syndrome status post pacemaker Last pacemaker check was in October.  Mild thrombocytopenia Lately count is now normal.  Concern for dysphagia and aspiration Speech therapy is following.  Plan is for modified barium swallow tomorrow.  DVT Prophylaxis: SCDs for now PUD Prophylaxis: PPI Code Status: DNR Family Communication: Discussed with patient's brother and sister-in-law who are his legal guardians. Disposition Plan: Management as outlined above.  We will need to figure out if he can return to his group home setting when ready for discharge.   Medications:  Scheduled: . amLODipine  10 mg Oral Daily  . clonazePAM  0.5 mg Oral TID  . cloNIDine  0.2 mg Transdermal Weekly  . dexamethasone  6 mg Intravenous Q24H  . ferrous sulfate  325 mg Oral QAC breakfast  . guaiFENesin  600 mg Oral BID  . insulin aspart  0-20 Units Subcutaneous TID WC  . insulin aspart  0-5 Units Subcutaneous QHS  . lamoTRIgine  100 mg Oral Daily  . lamoTRIgine  175 mg Oral QHS  . loratadine  10 mg Oral Daily  . Melatonin  3 mg Oral QHS  . multivitamin  1 tablet Oral Daily  . pantoprazole  40 mg Oral BID  . polyethylene glycol  17 g Oral BID  . QUEtiapine  150 mg Oral TID   Continuous: . azithromycin Stopped (12/21/18 1320)  . cefTRIAXone (ROCEPHIN)  IV Stopped (12/21/18 1245)  . remdesivir 100 mg in NS 250 mL 100 mg (12/22/18 0903)   [id number], chlorpheniramine-HYDROcodone, guaiFENesin-dextromethorphan, ondansetron **OR** ondansetron (ZOFRAN) IV, QUEtiapine, Resource ThickenUp Clear, senna   Objective:  Vital Signs  Vitals:   12/21/18 1700 12/21/18 2050 12/22/18 0406 12/22/18 0700  BP: 114/77 116/76 114/79 (!) 128/113  Pulse: 60 (!) 59 (!) 59 60  Resp: 19 (!) 23 (!) 23 10  Temp: (!) 97 F (36.1 C) 98.3 F (36.8 C) 97.6 F (36.4 C)  97.8 F (36.6 C)  TempSrc: Oral Oral Oral Oral  SpO2: 95% 93% 93% 94%  Weight:      Height:        Intake/Output Summary (Last 24 hours) at 12/22/2018 0949 Last data filed at 12/22/2018 0900 Gross per 24 hour  Intake 987.04 ml  Output 1500 ml  Net -512.96 ml   Filed Weights   12/20/18 2234  Weight: 82.3 kg    General appearance: Awake alert.  Distracted.  In no distress. Resp: Coarse breath sounds bilaterally.  Crackles at the bases bilaterally.  No wheezing or rhonchi.  Mildly tachypneic at rest. Cardio: S1-S2 is normal regular.  No S3-S4.  No rubs murmurs or bruit GI: Abdomen is soft.  Nontender nondistended.  Bowel sounds are present normal.  No masses organomegaly Extremities: No edema.  Moving all his  extremities. Neurologic: No obvious focal neurological deficits.    Lab Results:  Data Reviewed: I have personally reviewed following labs and imaging studies  CBC: Recent Labs  Lab 12/20/18 0016 12/20/18 0535 12/21/18 0124 12/22/18 0352  WBC 4.5 4.7 4.4 6.0  NEUTROABS 3.6  --  3.7 4.9  HGB 10.4* 10.2* 10.4* 11.0*  HCT 30.4* 29.0* 30.4* 32.9*  MCV 93.8 90.9 94.4 95.4  PLT 106* 103* 117* 0000000    Basic Metabolic Panel: Recent Labs  Lab 12/20/18 0016 12/20/18 0535 12/21/18 0124 12/22/18 0352  NA 133* 130* 133* 134*  K 4.7 4.7 4.0 4.4  CL 99 99 102 107  CO2 25 23 21* 20*  GLUCOSE 110* 128* 189* 163*  BUN 26* 24* 27* 34*  CREATININE 1.69* 1.37* 0.97 0.99  CALCIUM 8.1* 7.4* 7.2* 7.5*    GFR: Estimated Creatinine Clearance: 88.7 mL/min (by C-G formula based on SCr of 0.99 mg/dL).  Liver Function Tests: Recent Labs  Lab 12/20/18 0016 12/21/18 0124 12/22/18 0352  AST 37 34 27  ALT 21 21 18   ALKPHOS 79 68 72  BILITOT 0.7 0.3 0.3  PROT 6.7 5.7* 5.5*  ALBUMIN 3.5 2.8* 2.6*    Recent Labs  Lab 12/20/18 0016  LIPASE 24   Coagulation Profile: Recent Labs  Lab 12/20/18 0016  INR 1.1    CBG: Recent Labs  Lab 12/20/18 2248 12/21/18 0737  12/21/18 1137 12/21/18 1739 12/21/18 2052  GLUCAP 173* 198* 219* 114* 180*    Lipid Profile: Recent Labs    12/20/18 0016  TRIG 41    Anemia Panel: Recent Labs    12/20/18 0016 12/22/18 0352  VITAMINB12  --  1,244*  FOLATE  --  11.6  FERRITIN 334 456*  TIBC  --  148*  IRON  --  52  RETICCTPCT  --  0.6    Recent Results (from the past 240 hour(s))  Blood Culture (routine x 2)     Status: None (Preliminary result)   Collection Time: 12/20/18 12:12 AM   Specimen: BLOOD  Result Value Ref Range Status   Specimen Description BLOOD BLOOD RIGHT HAND  Final   Special Requests   Final    BOTTLES DRAWN AEROBIC AND ANAEROBIC Blood Culture results may not be optimal due to an excessive volume of blood received in culture bottles   Culture   Final    NO GROWTH 1 DAY Performed at [facility], [address] [place], [place] [address]    Report Status PENDING  Incomplete  SARS CORONAVIRUS 2 (TAT 6-24 HRS) Nasopharyngeal Nasopharyngeal Swab     Status: Abnormal   Collection Time: 12/20/18 12:16 AM   Specimen: Nasopharyngeal Swab  Result Value Ref Range Status   SARS Coronavirus 2 POSITIVE (A) NEGATIVE Final    Comment: RESULT CALLED TO, READ BACK BY AND VERIFIED WITH: L. GANNON, [facility]) AT 1410 ON 12/20/18 BY C. JESSUP, MT. (NOTE) SARS-CoV-2 target nucleic acids are DETECTED. The SARS-CoV-2 RNA is generally detectable in upper and lower respiratory specimens during the acute phase of infection. Positive results are indicative of active infection with SARS-CoV-2. Clinical  correlation with patient history and other diagnostic information is necessary to determine patient infection status. Positive results do  not rule out bacterial infection or co-infection with other viruses. The expected result is Negative. Fact Sheet for Patients: SugarRoll.be Fact Sheet for Healthcare Providers: https://www.woods-mathews.com/ This  test is not yet approved or cleared by the [place] FDA and  has been authorized for detection and/or diagnosis of SARS-CoV-2 by FDA under an Emergency Use Authorization (EUA). This EUA will remain  in effect (meaning this  test can be used) for the duration of the COVID-19 declaration under Section 564(b)(1) of the Act, 21 U.S.C. section 360bbb-3(b)(1), unless the authorization is terminated or revoked sooner. Performed at [facility], [place] [address], [place], [place] [address]   Blood Culture (routine x 2)     Status: None (Preliminary result)   Collection Time: 12/20/18 12:30 AM   Specimen: BLOOD  Result Value Ref Range Status   Specimen Description BLOOD RIGHT ANTECUBITAL  Final   Special Requests   Final    BOTTLES DRAWN AEROBIC AND ANAEROBIC Blood Culture results may not be optimal due to an excessive volume of blood received in culture bottles   Culture   Final    NO GROWTH 1 DAY Performed at [facility], [address], [place], [place] [address]    Report Status PENDING  Incomplete  Urine culture     Status: None   Collection Time: 12/20/18  3:01 AM   Specimen: In/Out Cath Urine  Result Value Ref Range Status   Specimen Description   Final    IN/OUT CATH URINE Performed at [facility], [address], [place], [place] [address]    Special Requests   Final    NONE Performed at [facility], [address] [place], [place] [address]    Culture   Final    NO GROWTH Performed at [facility], [place] [address] [place],  [address]    Report Status 12/21/2018 FINAL  Final      Radiology Studies: No results found.     LOS: 2 days   [facility] Pager on [email or web address]  12/22/2018, 9:49 AM

## 2018-12-23 ENCOUNTER — Inpatient Hospital Stay (HOSPITAL_COMMUNITY): Payer: Medicare Other

## 2018-12-23 DIAGNOSIS — R1312 Dysphagia, oropharyngeal phase: Secondary | ICD-10-CM

## 2018-12-23 LAB — CBC WITH DIFFERENTIAL/PLATELET
Abs Immature Granulocytes: 0.13 10*3/uL — ABNORMAL HIGH (ref 0.00–0.07)
Basophils Absolute: 0 10*3/uL (ref 0.0–0.1)
Basophils Relative: 0 %
Eosinophils Absolute: 0 10*3/uL (ref 0.0–0.5)
Eosinophils Relative: 0 %
HCT: 35.2 % — ABNORMAL LOW (ref 39.0–52.0)
Hemoglobin: 11.8 g/dL — ABNORMAL LOW (ref 13.0–17.0)
Immature Granulocytes: 2 %
Lymphocytes Relative: 13 %
Lymphs Abs: 0.8 10*3/uL (ref 0.7–4.0)
MCH: 31.9 pg (ref 26.0–34.0)
MCHC: 33.5 g/dL (ref 30.0–36.0)
MCV: 95.1 fL (ref 80.0–100.0)
Monocytes Absolute: 0.7 10*3/uL (ref 0.1–1.0)
Monocytes Relative: 12 %
Neutro Abs: 4.1 10*3/uL (ref 1.7–7.7)
Neutrophils Relative %: 73 %
Platelets: 181 10*3/uL (ref 150–400)
RBC: 3.7 MIL/uL — ABNORMAL LOW (ref 4.22–5.81)
RDW: 14.5 % (ref 11.5–15.5)
WBC: 5.6 10*3/uL (ref 4.0–10.5)
nRBC: 0 % (ref 0.0–0.2)

## 2018-12-23 LAB — COMPREHENSIVE METABOLIC PANEL
ALT: 21 U/L (ref 0–44)
AST: 31 U/L (ref 15–41)
Albumin: 2.7 g/dL — ABNORMAL LOW (ref 3.5–5.0)
Alkaline Phosphatase: 70 U/L (ref 38–126)
Anion gap: 9 (ref 5–15)
BUN: 37 mg/dL — ABNORMAL HIGH (ref 6–20)
CO2: 19 mmol/L — ABNORMAL LOW (ref 22–32)
Calcium: 7.8 mg/dL — ABNORMAL LOW (ref 8.9–10.3)
Chloride: 108 mmol/L (ref 98–111)
Creatinine, Ser: 1.07 mg/dL (ref 0.61–1.24)
GFR calc Af Amer: >60 mL/min (ref >60)
GFR calc non Af Amer: >60 mL/min (ref >60)
Glucose, Bld: 125 mg/dL — ABNORMAL HIGH (ref 70–99)
Potassium: 4.7 mmol/L (ref 3.5–5.1)
Sodium: 136 mmol/L (ref 135–145)
Total Bilirubin: 0.3 mg/dL (ref 0.3–1.2)
Total Protein: 5.6 g/dL — ABNORMAL LOW (ref 6.5–8.1)

## 2018-12-23 LAB — GLUCOSE, CAPILLARY
Glucose-Capillary: 98 mg/dL (ref 70–99)
Glucose-Capillary: 148 mg/dL — ABNORMAL HIGH (ref 70–99)
Glucose-Capillary: 187 mg/dL — ABNORMAL HIGH (ref 70–99)
Glucose-Capillary: 148 mg/dL — ABNORMAL HIGH (ref 70–99)

## 2018-12-23 LAB — PROCALCITONIN: Procalcitonin: 0.25 ng/mL

## 2018-12-23 LAB — D-DIMER, QUANTITATIVE: D-Dimer, Quant: 1.05 ug/mL-FEU — ABNORMAL HIGH (ref 0.00–0.50)

## 2018-12-23 LAB — C-REACTIVE PROTEIN: CRP: 5.2 mg/dL — ABNORMAL HIGH (ref <1.0)

## 2018-12-23 MED ORDER — ENOXAPARIN SODIUM 40 MG/0.4ML ~~LOC~~ SOLN
40.0000 mg | SUBCUTANEOUS | Status: DC
  Administered 2018-12-23 – 2018-12-24 (×2): 40 mg via SUBCUTANEOUS
  Filled 2018-12-23 (×2): qty 0.4

## 2018-12-23 MED ORDER — QUETIAPINE FUMARATE 100 MG PO TABS
100.0000 mg | ORAL_TABLET | Freq: Three times a day (TID) | ORAL | Status: DC
  Filled 2018-12-23 (×4): qty 1

## 2018-12-23 MED ORDER — QUETIAPINE FUMARATE 25 MG PO TABS: 50.0000 mg | ORAL_TABLET | Freq: Three times a day (TID) | ORAL | Status: DC

## 2018-12-23 MED ORDER — QUETIAPINE FUMARATE 50 MG PO TABS
150.0000 mg | ORAL_TABLET | Freq: Three times a day (TID) | ORAL | Status: DC
  Administered 2018-12-23 – 2018-12-24 (×4): 150 mg via ORAL
  Filled 2018-12-23 (×7): qty 3

## 2018-12-23 NOTE — Progress Notes (Signed)
  Speech Language Pathology Treatment: Dysphagia  Patient Details Name: Frank Conley MRN: [id number] DOB: 1960-06-22 Today's Date: 12/23/2018 Time: [id number] SLP Time Calculation (min) (ACUTE ONLY): 10 min  Assessment / Plan / Recommendation Clinical Impression  F/u after yesterday's clinical swallow assessment.  Pt with decreased frequency of coughing with thin liquids today.  He demonstrates some insight, describing hx of drinking too fast with resulting coughing.  Requires intermittent verbal cues for rate/quantity.  Given ongoing presentation with difficulty discerning baseline cough from one that might be related to swallow function, recommend proceeding with MBS this afternoon as recommended.  Pt agrees.  Scheduled for 12:30.    HPI HPI: 58 year old male with a past medical history of mental disability who lives in a group home, also has a history of essential hypertension, seizure disorder, MGUS, sick sinus syndrome status post pacemaker who presented with cough shortness of breath.  Was seen by his primary care provider and a swab was sent for COVID-19.  It came back positive.  Patient was sent over to the emergency department.  Chest x-ray showed multifocal pneumonia.       SLP Plan  MBS       Recommendations  Diet recommendations: Dysphagia 1 (puree);Honey-thick liquid Liquids provided via: Cup Medication Administration: Crushed with puree Supervision: Patient able to self feed;Staff to assist with self feeding Compensations: Slow rate;Small sips/bites Postural Changes and/or Swallow Maneuvers: Seated upright 90 degrees                Oral Care Recommendations: Oral care BID Follow up Recommendations: Other (comment)(tba) SLP Visit Diagnosis: Dysphagia, oropharyngeal phase (R13.12) Plan: MBS       GO                [name] 12/23/2018, 12:07 PM  Marion Rosenberry L. Tivis Ringer, MA CCC/SLP Acute Rehabilitation Services Office number [phone] Use  secure chat feature for communication

## 2018-12-23 NOTE — Progress Notes (Signed)
PROGRESS NOTE  Frank Conley [id number] DOB: 17-May-1960 DOA: 12/20/2018  PCP: Samuel Bouche, MD  Brief History/Interval Summary: 58 year old male with a past medical history of mental disability who lives in a group home, also has a history of essential hypertension, seizure disorder, MGUS, sick sinus syndrome status post pacemaker who presented with cough shortness of breath.  Was seen by his primary care provider and a swab was sent for COVID-19.  It came back positive.  Patient was sent over to the emergency department.  Chest x-ray showed multifocal pneumonia.  Patient was hospitalized for further management.   Reason for Visit: Pneumonia due to COVID-19.  Consultants: None  Procedures: None  Antibiotics: Anti-infectives (From admission, onward)   Start     Dose/Rate Route Frequency Ordered Stop   12/22/18 1000  remdesivir 100 mg in sodium chloride 0.9 % 250 mL IVPB     100 mg 500 mL/hr over 30 Minutes Intravenous Every 24 hours 12/20/18 2202 12/25/18 0959   12/21/18 1600  remdesivir 100 mg in sodium chloride 0.9 % 250 mL IVPB     100 mg 500 mL/hr over 30 Minutes Intravenous Every 24 hours 12/20/18 2202 12/21/18 1800   12/21/18 1200  azithromycin (ZITHROMAX) 500 mg in sodium chloride 0.9 % 250 mL IVPB     500 mg 250 mL/hr over 60 Minutes Intravenous Every 24 hours 12/21/18 1101 12/25/18 1159   12/21/18 1115  cefTRIAXone (ROCEPHIN) 1 g in sodium chloride 0.9 % 100 mL IVPB     1 g 200 mL/hr over 30 Minutes Intravenous Every 24 hours 12/21/18 1101 12/25/18 1114      Subjective/Interval History: No overnight issues per nursing staff.  Patient with incomprehensible speech.  Does not appear to be in any discomfort.    Assessment/Plan:  Pneumonia due to COVID-19/aspiration pneumonia  COVID-19 Labs  Recent Labs    12/21/18 0124 12/22/18 0352 12/23/18 0437  DDIMER 1.14* 0.92* 1.05*  FERRITIN  --  456*  --   CRP 16.9* 8.8* 5.2*    Lab Results  Component  Value Date   SARSCOV2NAA POSITIVE (A) 12/20/2018     Fever: Remains afebrile Oxygen requirements: On room air.  Saturating in the early 90s Antibacterials: Ceftriaxone and azithromycin day 4 Remdesivir: Day 4 Steroids: Dexamethasone 6 mg daily Diuretics: Not on diuretics on a scheduled basis Actemra: Not given due to elevated procalcitonin Vitamin C and Zinc: Continue DVT Prophylaxis: Lovenox held due to concern for GI bleed.  SCDs  Patient's respiratory status is stable.  His presentation is most likely due to a combination of COVID-19 as well as aspiration pneumonia.  His CRP was significantly elevated at 23.1 and has improved to 5.2 as of today.  D-dimer 1.05.  Patient remains on remdesivir steroids and antibiotics.  Procalcitonin was elevated and has improved.  Antibiotics for 5 days.  WBC has been normal.  No DVT on lower extremity Doppler studies.  Dysphagia/aspiration Speech therapy is following.  He is on a dysphagia diet.  Plan is for a modified.  Swallow during this hospitalization.  Normocytic anemia Hemoglobin is stable.  Anemia panel reviewed.  No clear-cut deficiencies identified.  Questionable black stool No overt bleeding has been noted.  No melanotic stool.  Dark stool noted initially may have been due to iron tablets.  Patient's hemoglobin has been stable.  No further work-up at this time.  Continue PPI.     Hyponatremia Sodium level is normal today.    Acute kidney  injury on chronic kidney disease stage III Renal function is back to normal.  Monitor urine output.  IV fluids were discontinued.    Hyperglycemia No documented history of diabetes.  Elevated CBG most likely due to steroids.  HbA1c is 5.1.  SSI.  History of seizure disorder Continue Lamictal  Essential hypertension Noted to be on clonidine patch and amlodipine which is being continued.  Blood pressure is reasonably well controlled.  Developmental disability Continue Seroquel and Klonopin.  He  lives in a group home setting.  Sick sinus syndrome status post pacemaker Last pacemaker check was in October.  Mild thrombocytopenia Platelet count is now normal  DVT Prophylaxis: SCDs for now PUD Prophylaxis: PPI Code Status: DNR Family Communication: Daily updates being provided to patient's brother and sister-in-law who are his legal guardians.   Disposition Plan: Management as outlined above.  We will need to figure out if he can return to his group home setting when ready for discharge.   Medications:  Scheduled: . amLODipine  10 mg Oral Daily  . clonazePAM  0.5 mg Oral TID  . cloNIDine  0.2 mg Transdermal Weekly  . dexamethasone  6 mg Intravenous Q24H  . ferrous sulfate  325 mg Oral QAC breakfast  . guaiFENesin  600 mg Oral BID  . insulin aspart  0-20 Units Subcutaneous TID WC  . insulin aspart  0-5 Units Subcutaneous QHS  . lamoTRIgine  100 mg Oral Daily  . lamoTRIgine  175 mg Oral QHS  . loratadine  10 mg Oral Daily  . Melatonin  3 mg Oral QHS  . multivitamin  1 tablet Oral Daily  . pantoprazole  40 mg Oral BID  . polyethylene glycol  17 g Oral BID  . QUEtiapine  150 mg Oral TID  . vitamin C  500 mg Oral Daily  . zinc sulfate  220 mg Oral Daily   Continuous: . azithromycin 500 mg (12/22/18 1123)  . cefTRIAXone (ROCEPHIN)  IV 1 g (12/23/18 1106)  . remdesivir 100 mg in NS 250 mL 100 mg (12/23/18 1013)   [id number], chlorpheniramine-HYDROcodone, guaiFENesin-dextromethorphan, ondansetron **OR** ondansetron (ZOFRAN) IV, QUEtiapine, Resource ThickenUp Clear, senna   Objective:  Vital Signs  Vitals:   12/22/18 1640 12/22/18 1945 12/23/18 0400 12/23/18 0728  BP: 124/78 110/75 114/80 125/80  Pulse: (!) 59 80 60 61  Resp: (!) 22 15 16 14   Temp: (!) 97.5 F (36.4 C) 97.8 F (36.6 C) 97.9 F (36.6 C) 97.7 F (36.5 C)  TempSrc: Oral Oral Oral Oral  SpO2: 95% 100% 95% 93%  Weight:      Height:        Intake/Output Summary (Last 24 hours) at  12/23/2018 1141 Last data filed at 12/23/2018 0300 Gross per 24 hour  Intake 1330.04 ml  Output 1025 ml  Net 305.04 ml   Filed Weights   12/20/18 2234  Weight: 82.3 kg    General appearance: Awake alert.  In no distress.  Distracted Resp: Coarse breath sound bilaterally with crackles at the bases.  No wheezing or rhonchi.  Mildly tachypneic at rest.   Cardio: S1-S2 is normal regular.  No S3-S4.  No rubs murmurs or bruit GI: Abdomen is soft.  Nontender nondistended.  Bowel sounds are present normal.  No masses organomegaly Extremities: No edema.  Moving all his extremities Neurologic: Difficult to understand the patient.  No obvious focal deficits noted.     Lab Results:  Data Reviewed: I have personally reviewed following labs and imaging  studies  CBC: Recent Labs  Lab 12/20/18 0016 12/20/18 0535 12/21/18 0124 12/22/18 0352 12/23/18 0437  WBC 4.5 4.7 4.4 6.0 5.6  NEUTROABS 3.6  --  3.7 4.9 4.1  HGB 10.4* 10.2* 10.4* 11.0* 11.8*  HCT 30.4* 29.0* 30.4* 32.9* 35.2*  MCV 93.8 90.9 94.4 95.4 95.1  PLT 106* 103* 117* 176 0000000    Basic Metabolic Panel: Recent Labs  Lab 12/20/18 0016 12/20/18 0535 12/21/18 0124 12/22/18 0352 12/23/18 0437  NA 133* 130* 133* 134* 136  K 4.7 4.7 4.0 4.4 4.7  CL 99 99 102 107 108  CO2 25 23 21* 20* 19*  GLUCOSE 110* 128* 189* 163* 125*  BUN 26* 24* 27* 34* 37*  CREATININE 1.69* 1.37* 0.97 0.99 1.07  CALCIUM 8.1* 7.4* 7.2* 7.5* 7.8*    GFR: Estimated Creatinine Clearance: 82.1 mL/min (by C-G formula based on SCr of 1.07 mg/dL).  Liver Function Tests: Recent Labs  Lab 12/20/18 0016 12/21/18 0124 12/22/18 0352 12/23/18 0437  AST 37 34 27 31  ALT 21 21 18 21   ALKPHOS 79 68 72 70  BILITOT 0.7 0.3 0.3 0.3  PROT 6.7 5.7* 5.5* 5.6*  ALBUMIN 3.5 2.8* 2.6* 2.7*    Recent Labs  Lab 12/20/18 0016  LIPASE 24   Coagulation Profile: Recent Labs  Lab 12/20/18 0016  INR 1.1    CBG: Recent Labs  Lab 12/21/18 1137 12/21/18  1739 12/21/18 2052 12/22/18 1125 12/22/18 1646  GLUCAP 219* 114* 180* 128* 158*    Anemia Panel: Recent Labs    12/22/18 0352  VITAMINB12 1,244*  FOLATE 11.6  FERRITIN 456*  TIBC 148*  IRON 52  RETICCTPCT 0.6    Recent Results (from the past 240 hour(s))  Blood Culture (routine x 2)     Status: None (Preliminary result)   Collection Time: 12/20/18 12:12 AM   Specimen: BLOOD  Result Value Ref Range Status   Specimen Description BLOOD BLOOD RIGHT HAND  Final   Special Requests   Final    BOTTLES DRAWN AEROBIC AND ANAEROBIC Blood Culture results may not be optimal due to an excessive volume of blood received in culture bottles   Culture   Final    NO GROWTH 3 DAYS Performed at [facility], [address] [place], [place] [address]    Report Status PENDING  Incomplete  SARS CORONAVIRUS 2 (TAT 6-24 HRS) Nasopharyngeal Nasopharyngeal Swab     Status: Abnormal   Collection Time: 12/20/18 12:16 AM   Specimen: Nasopharyngeal Swab  Result Value Ref Range Status   SARS Coronavirus 2 POSITIVE (A) NEGATIVE Final    Comment: RESULT CALLED TO, READ BACK BY AND VERIFIED WITH: L. GANNON, [facility]) AT 1410 ON 12/20/18 BY C. JESSUP, MT. (NOTE) SARS-CoV-2 target nucleic acids are DETECTED. The SARS-CoV-2 RNA is generally detectable in upper and lower respiratory specimens during the acute phase of infection. Positive results are indicative of active infection with SARS-CoV-2. Clinical  correlation with patient history and other diagnostic information is necessary to determine patient infection status. Positive results do  not rule out bacterial infection or co-infection with other viruses. The expected result is Negative. Fact Sheet for Patients: SugarRoll.be Fact Sheet for Healthcare Providers: https://www.woods-mathews.com/ This test is not yet approved or cleared by the [place] FDA and  has been authorized for detection  and/or diagnosis of SARS-CoV-2 by FDA under an Emergency Use Authorization (EUA). This EUA will remain  in effect (meaning this  test can be  used) for the duration of the COVID-19 declaration under Section 564(b)(1) of the Act, 21 U.S.C. section 360bbb-3(b)(1), unless the authorization is terminated or revoked sooner. Performed at [facility], [place] [address], [place], [place] [address]   Blood Culture (routine x 2)     Status: None (Preliminary result)   Collection Time: 12/20/18 12:30 AM   Specimen: BLOOD  Result Value Ref Range Status   Specimen Description BLOOD RIGHT ANTECUBITAL  Final   Special Requests   Final    BOTTLES DRAWN AEROBIC AND ANAEROBIC Blood Culture results may not be optimal due to an excessive volume of blood received in culture bottles   Culture   Final    NO GROWTH 3 DAYS Performed at [facility], [address], [place], [place] [address]    Report Status PENDING  Incomplete  Urine culture     Status: None   Collection Time: 12/20/18  3:01 AM   Specimen: In/Out Cath Urine  Result Value Ref Range Status   Specimen Description   Final    IN/OUT CATH URINE Performed at [facility], [address], [place], [place] [address]    Special Requests   Final    NONE Performed at [facility], [address], [place], [place] [address]    Culture   Final    NO GROWTH Performed at [facility], [place] [address] [place], [place] [address]    Report Status 12/21/2018 FINAL  Final      Radiology Studies: Dg Chest Port 1 View  Result Date: 12/22/2018 CLINICAL DATA:  COVID positive, shortness of breath EXAM: PORTABLE CHEST 1 VIEW COMPARISON:  12/19/2018 FINDINGS: Low volume AP portable examination with an interval increase in heterogeneous airspace opacity, consolidation, and probable small pleural effusion of the left lung base, and new consolidation or atelectasis at the right lung base. Unchanged cardiomegaly with  right chest multi lead pacer. IMPRESSION: 1. Low volume AP portable examination with an interval increase in heterogeneous airspace opacity, consolidation, and probable small pleural effusion of the left lung base, and new consolidation or atelectasis at the right lung base. Findings are consistent with worsened infection. 2.  Unchanged cardiomegaly with right chest multi lead pacer. Electronically Signed   By: Eddie Candle M.D.   On: 12/22/2018 16:05       LOS: 3 days   [facility] Pager on [email or web address]  12/23/2018, 11:41 AM

## 2018-12-23 NOTE — TOC Progression Note (Signed)
Transition of Care [facility]) - Progression Note    Patient Details  Name: TYREEK KENNEMER MRN: [id number] Date of Birth: 07/24/1960  [facility] CM/SW Contact  Joaquin Courts, RN Phone Number: 12/23/2018, 4:01 PM  Clinical Narrative:    CM spoke with patient brother and sister in law who are his legal guardians. Brother reports he can transport the patient from hospital to group home at time of discharge. Brother also gave permission for CM to speak with Gena Fray RN [phone]) who is coordinating patient;s return to group home.    CM spoke with donna steele who reports patient can return to group home but will need an updated FL2 with discharge medications listed, additionally will need to reflect his PTA  PRN medications as well. Butch Penny states will also need results of barium swallow study to ensure they are providing patient with the proper diet consistency. Information can be faxed to [phone].     Expected Discharge Plan: Group Home Barriers to Discharge: Continued Medical Work up  Expected Discharge Plan and Services Expected Discharge Plan: Group Home   Discharge Planning Services: CM Consult   Living arrangements for the past 2 months: Group Home                 DME Arranged: N/A DME Agency: NA       HH Arranged: NA HH Agency: NA         Social Determinants of Health (SDOH) Interventions    Readmission Risk Interventions No flowsheet data found.

## 2018-12-23 NOTE — Progress Notes (Signed)
Pt transported off floor to Rad with ST and Charge RN for speech eval, pt stable at this time. 12/23/2018 McDonald, RN

## 2018-12-23 NOTE — Evaluation (Signed)
Physical Therapy Evaluation Patient Details Name: Frank Conley MRN: [id number] DOB: 03-12-1960 Today's Date: 12/23/2018   History of Present Illness  Pt adm with covid PNA. PMH - intellectual disability, htn, seizure, pacer  Clinical Impression  Pt presents to PT close to baseline with mobility. Expect he will return to his baseline with increased activity. Pt from a group home and unsure what their policy is in regards to Covid. Will continue to follow here but doubt there will be a need for PT at dc.     Follow Up Recommendations No PT follow up    Equipment Recommendations  None recommended by PT    Recommendations for Other Services       Precautions / Restrictions Precautions Precautions: Fall Restrictions Weight Bearing Restrictions: No      Mobility  Bed Mobility               General bed mobility comments: Pt up in chair  Transfers Overall transfer level: Needs assistance Equipment used: None;Rolling walker (2 wheeled) Transfers: Sit to/from Stand Sit to Stand: Min guard         General transfer comment: Assist for lines/safety  Ambulation/Gait Ambulation/Gait assistance: Min guard Gait Distance (Feet): 150 Feet Assistive device: None;Rolling walker (2 wheeled) Gait Pattern/deviations: Step-through pattern;Decreased step length - right;Decreased step length - left;Shuffle Gait velocity: decr Gait velocity interpretation: <1.31 ft/sec, indicative of household ambulator General Gait Details: Assist for safety and lines. With walker pt requires cues. Gait more fluid without assistive device. Pt on RA with SpO2> 95%.  Stairs            Wheelchair Mobility    Modified Rankin (Stroke Patients Only)       Balance Overall balance assessment: Needs assistance Sitting-balance support: No upper extremity supported;Feet supported Sitting balance-Leahy Scale: Fair     Standing balance support: No upper extremity supported;During functional  activity Standing balance-Leahy Scale: Fair                               Pertinent Vitals/Pain Pain Assessment: No/denies pain    Home Living Family/patient expects to be discharged to:: Group home                 Additional Comments: 1 level home    Prior Function Level of Independence: Needs assistance   Gait / Transfers Assistance Needed: modified independent. No assistive device  ADL's / Homemaking Assistance Needed: supervision for bathing/dressing        Hand Dominance        Extremity/Trunk Assessment   Upper Extremity Assessment Upper Extremity Assessment: Defer to OT evaluation    Lower Extremity Assessment Lower Extremity Assessment: Generalized weakness       Communication   Communication: Other (comment)(at times difficult to understand due to mumbling)  Cognition Arousal/Alertness: Awake/alert Behavior During Therapy: WFL for tasks assessed/performed Overall Cognitive Status: History of cognitive impairments - at baseline                                        General Comments      Exercises     Assessment/Plan    PT Assessment Patient needs continued PT services  PT Problem List Decreased strength;Decreased balance;Decreased mobility       PT Treatment Interventions DME instruction;Gait training;Functional mobility training;Therapeutic activities;Therapeutic exercise;Balance training;Patient/family  education    PT Goals (Current goals can be found in the Care Plan section)  Acute Rehab PT Goals Patient Stated Goal: not stated PT Goal Formulation: Patient unable to participate in goal setting Time For Goal Achievement: 12/30/18 Potential to Achieve Goals: Good    Frequency Min 3X/week   Barriers to discharge Other (comment) Unsure if group home able to take residents who are Covid positive    Co-evaluation               AM-PAC PT "6 Clicks" Mobility  Outcome Measure Help needed turning  from your back to your side while in a flat bed without using bedrails?: None Help needed moving from lying on your back to sitting on the side of a flat bed without using bedrails?: None Help needed moving to and from a bed to a chair (including a wheelchair)?: A Little Help needed standing up from a chair using your arms (e.g., wheelchair or bedside chair)?: A Little Help needed to walk in hospital room?: A Little Help needed climbing 3-5 steps with a railing? : A Little 6 Click Score: 20    End of Session   Activity Tolerance: Patient tolerated treatment well Patient left: in chair;with call bell/phone within reach   PT Visit Diagnosis: Unsteadiness on feet (R26.81)    Time: [id number] PT Time Calculation (min) (ACUTE ONLY): 26 min   Charges:   PT Evaluation $PT Eval Moderate Complexity: 1 Mod PT Treatments $Gait Training: 8-22 mins        [place] Pager [phone] Office [place] 12/23/2018, 9:58 AM

## 2018-12-23 NOTE — Evaluation (Signed)
Occupational Therapy Evaluation Patient Details Name: Frank Conley MRN: [id number] DOB: 11-May-1960 Today's Date: 12/23/2018    History of Present Illness Pt adm with covid PNA. PMH - intellectual disability, htn, seizure, pacer   Clinical Impression   This 58 y/o male presents with the above. PTA pt living in group home, performing functional mobility without AD and receiving supervision for bathing/dressing ADL. Pt currently requiring minA for functional mobility (HHA/use of IV pole) within room, requiring up to [facility] for LB ADL. Pt on RA with VSS throughout. He will benefit from continued acute OT services and recommend follow up [place] services (pending group home able to take pt back while covid+) after discharge. If group home not able to take pt back while covid+ may need to consider SNF initially. Will follow.     Follow Up Recommendations  Home health OT;Supervision/Assistance - 24 hour(return to group home, if able to take Covid+)    Equipment Recommendations  3 in 1 bedside commode(for use in shower)    Recommendations for Other Services       Precautions / Restrictions Precautions Precautions: Fall Restrictions Weight Bearing Restrictions: No      Mobility Bed Mobility               General bed mobility comments: Pt up in chair  Transfers Overall transfer level: Needs assistance Equipment used: None Transfers: Sit to/from Stand Sit to Stand: Min guard;Min assist         General transfer comment: Assist for lines/safety, minA with initial stand as pt with LOB requiring assist to correct    Balance Overall balance assessment: Needs assistance Sitting-balance support: No upper extremity supported;Feet supported Sitting balance-Leahy Scale: Fair     Standing balance support: No upper extremity supported;During functional activity;Single extremity supported Standing balance-Leahy Scale: Fair                             ADL either  performed or assessed with clinical judgement   ADL Overall ADL's : Needs assistance/impaired Eating/Feeding: Set up;Sitting   Grooming: Min guard;Sitting   Upper Body Bathing: Min guard;Sitting   Lower Body Bathing: Moderate assistance;Sit to/from stand   Upper Body Dressing : Set up;Min guard;Sitting   Lower Body Dressing: Sit to/from stand;Moderate assistance Lower Body Dressing Details (indicate cue type and reason): minA standing balance Toilet Transfer: Minimal assistance;Ambulation Toilet Transfer Details (indicate cue type and reason): simulated via transfer to/from recliner Toileting- Clothing Manipulation and Hygiene: Moderate assistance;Sit to/from stand       Functional mobility during ADLs: Minimal assistance                           Pertinent Vitals/Pain Pain Assessment: No/denies pain     Hand Dominance     Extremity/Trunk Assessment Upper Extremity Assessment Upper Extremity Assessment: Generalized weakness   Lower Extremity Assessment Lower Extremity Assessment: Defer to PT evaluation       Communication Communication Communication: Other (comment)(at times difficult to understand due to mumbling)   Cognition Arousal/Alertness: Awake/alert Behavior During Therapy: WFL for tasks assessed/performed Overall Cognitive Status: History of cognitive impairments - at baseline                                 General Comments: multimodal cues for following commands, suspect likely baseline   General Comments  Exercises     Shoulder Instructions      Home Living Family/patient expects to be discharged to:: Group home                                 Additional Comments: 1 level home      Prior Functioning/Environment Level of Independence: Needs assistance  Gait / Transfers Assistance Needed: modified independent. No assistive device ADL's / Homemaking Assistance Needed: supervision for  bathing/dressing            OT Problem List: Decreased strength;Decreased range of motion;Decreased activity tolerance;Impaired balance (sitting and/or standing);Decreased cognition;Decreased safety awareness;Decreased knowledge of use of DME or AE;Cardiopulmonary status limiting activity      OT Treatment/Interventions: Self-care/ADL training;Therapeutic exercise;Neuromuscular education;DME and/or AE instruction;Energy conservation;Therapeutic activities;Cognitive remediation/compensation;Visual/perceptual remediation/compensation;Patient/family education;Balance training    OT Goals(Current goals can be found in the care plan section) Acute Rehab OT Goals Patient Stated Goal: "two cookies please" OT Goal Formulation: With patient Time For Goal Achievement: 01/06/19 Potential to Achieve Goals: Good  OT Frequency: Min 2X/week   Barriers to D/C:            Co-evaluation              AM-PAC OT "6 Clicks" Daily Activity     Outcome Measure Help from another person eating meals?: A Little Help from another person taking care of personal grooming?: A Little Help from another person toileting, which includes using toliet, bedpan, or urinal?: A Little Help from another person bathing (including washing, rinsing, drying)?: A Lot Help from another person to put on and taking off regular upper body clothing?: A Little Help from another person to put on and taking off regular lower body clothing?: A Lot 6 Click Score: 16   End of Session Nurse Communication: Mobility status  Activity Tolerance: Patient tolerated treatment well Patient left: in chair;with call bell/phone within reach;with chair alarm set  OT Visit Diagnosis: Muscle weakness (generalized) (M62.81);Other symptoms and signs involving cognitive function                Time: 1120-1145 OT Time Calculation (min): 25 min Charges:  OT General Charges $OT Visit: 1 Visit OT Evaluation $OT Eval Moderate Complexity: 1  Mod OT Treatments $Self Care/Home Management : 8-22 mins  Lou Cal, OT Supplemental Rehabilitation Services Pager [phone] Office [phone]   Frank Conley 12/23/2018, 5:18 PM

## 2018-12-23 NOTE — Progress Notes (Signed)
Objective Swallowing Evaluation: Type of Study: MBS-Modified Barium Swallow Study   Patient Details  Name: Frank Conley MRN: [id number] Date of Birth: 01-06-1961  Today's Date: 12/23/2018 Time: SLP Start Time (ACUTE ONLY): [id number] -SLP Stop Time (ACUTE ONLY): 1300  SLP Time Calculation (min) (ACUTE ONLY): 45 min   Past Medical History:  Past Medical History:  Diagnosis Date  . Depression   . Hypertension   . MGUS (monoclonal gammopathy of unknown significance)   . Pervasive developmental disorder   . Seizures ([place])   . SSS (sick sinus syndrome) ([facility])    PPM placed   Past Surgical History:  Past Surgical History:  Procedure Laterality Date  . PACEMAKER IMPLANT     HPI: 58 year old male with a past medical history of mental disability who lives in a group home, also has a history of essential hypertension, seizure disorder, MGUS, sick sinus syndrome status post pacemaker who presented with cough shortness of breath.  Was seen by his primary care provider and a swab was sent for COVID-19.  It came back positive.  Patient was sent over to the emergency department.  Chest x-ray showed multifocal pneumonia.    Subjective: alert, cooperative    Assessment / Plan / Recommendation  CHL IP CLINICAL IMPRESSIONS 12/23/2018  Clinical Impression Pt presents with a mild dysphagia due in large part to osteophytes at C4-6. Under typical circumstances, when healthy, he is likely better able to compensate for this structural impedence.  Oral phase of swallow was normal.  As materials passed into pharynx, they tended to sit on the shelf created by osteophytes.  After the swallow, some of this residue was observed to spill into the larynx.  Amount of penetrate was trace, and when it reached the vocal folds, pt cleared his throat or coughed, which effectively expelled the penetrate from the laryngeal vestibule. There was no observed aspiration.  There appeared to be mild decrease in pharyngeal  squeeze, potentially contributing to residue.  For now, recommend advancing diet to mechanical soft/dysphagia 3, thin liquids; crush meds in puree to avoid lodging above UES.  Results explained to pt and RN.  SLP will continue to follow for education/safety.   SLP Visit Diagnosis Dysphagia, oropharyngeal phase (R13.12)  Attention and concentration deficit following --  Frontal lobe and executive function deficit following --  Impact on safety and function Mild aspiration risk      CHL IP TREATMENT RECOMMENDATION 12/23/2018  Treatment Recommendations Therapy as outlined in treatment plan below     No flowsheet data found.  CHL IP DIET RECOMMENDATION 12/23/2018  SLP Diet Recommendations Dysphagia 3 (Mech soft) solids;Thin liquid  Liquid Administration via Cup;Straw  Medication Administration Crushed with puree  Compensations Slow rate;Small sips/bites  Postural Changes --      CHL IP OTHER RECOMMENDATIONS 12/23/2018  Recommended Consults --  Oral Care Recommendations Oral care BID  Other Recommendations --      CHL IP FOLLOW UP RECOMMENDATIONS 12/23/2018  Follow up Recommendations Other (comment)      CHL IP FREQUENCY AND DURATION 12/23/2018  Speech Therapy Frequency (ACUTE ONLY) min 2x/week  Treatment Duration 2 weeks               Mykenzi Vanzile L. Tivis Ringer, MA CCC/SLP Acute Rehabilitation Services Office number [phone] Use secure chat for communication    [name] 12/23/2018, 1:30 PM

## 2018-12-23 NOTE — Plan of Care (Signed)
Pt updated on POC, verbalized understanding

## 2018-12-24 LAB — CBC WITH DIFFERENTIAL/PLATELET
Abs Immature Granulocytes: 0.22 10*3/uL — ABNORMAL HIGH (ref 0.00–0.07)
Basophils Absolute: 0 10*3/uL (ref 0.0–0.1)
Basophils Relative: 0 %
Eosinophils Absolute: 0 10*3/uL (ref 0.0–0.5)
Eosinophils Relative: 0 %
HCT: 37.9 % — ABNORMAL LOW (ref 39.0–52.0)
Hemoglobin: 12.6 g/dL — ABNORMAL LOW (ref 13.0–17.0)
Immature Granulocytes: 4 %
Lymphocytes Relative: 15 %
Lymphs Abs: 0.9 10*3/uL (ref 0.7–4.0)
MCH: 31.8 pg (ref 26.0–34.0)
MCHC: 33.2 g/dL (ref 30.0–36.0)
MCV: 95.7 fL (ref 80.0–100.0)
Monocytes Absolute: 0.7 10*3/uL (ref 0.1–1.0)
Monocytes Relative: 11 %
Neutro Abs: 4 10*3/uL (ref 1.7–7.7)
Neutrophils Relative %: 70 %
Platelets: 227 10*3/uL (ref 150–400)
RBC: 3.96 MIL/uL — ABNORMAL LOW (ref 4.22–5.81)
RDW: 14.9 % (ref 11.5–15.5)
WBC: 5.8 10*3/uL (ref 4.0–10.5)
nRBC: 0.3 % — ABNORMAL HIGH (ref 0.0–0.2)

## 2018-12-24 LAB — COMPREHENSIVE METABOLIC PANEL
ALT: 38 U/L (ref 0–44)
AST: 51 U/L — ABNORMAL HIGH (ref 15–41)
Albumin: 3.3 g/dL — ABNORMAL LOW (ref 3.5–5.0)
Alkaline Phosphatase: 76 U/L (ref 38–126)
Anion gap: 10 (ref 5–15)
BUN: 46 mg/dL — ABNORMAL HIGH (ref 6–20)
CO2: 20 mmol/L — ABNORMAL LOW (ref 22–32)
Calcium: 7.9 mg/dL — ABNORMAL LOW (ref 8.9–10.3)
Chloride: 107 mmol/L (ref 98–111)
Creatinine, Ser: 1.05 mg/dL (ref 0.61–1.24)
GFR calc Af Amer: >60 mL/min (ref >60)
GFR calc non Af Amer: >60 mL/min (ref >60)
Glucose, Bld: 95 mg/dL (ref 70–99)
Potassium: 4.6 mmol/L (ref 3.5–5.1)
Sodium: 137 mmol/L (ref 135–145)
Total Bilirubin: 0.5 mg/dL (ref 0.3–1.2)
Total Protein: 6.6 g/dL (ref 6.5–8.1)

## 2018-12-24 LAB — GLUCOSE, CAPILLARY
Glucose-Capillary: 185 mg/dL — ABNORMAL HIGH (ref 70–99)
Glucose-Capillary: 141 mg/dL — ABNORMAL HIGH (ref 70–99)
Glucose-Capillary: 106 mg/dL — ABNORMAL HIGH (ref 70–99)
Glucose-Capillary: 263 mg/dL — ABNORMAL HIGH (ref 70–99)

## 2018-12-24 LAB — C-REACTIVE PROTEIN: CRP: 4.4 mg/dL — ABNORMAL HIGH (ref <1.0)

## 2018-12-24 LAB — D-DIMER, QUANTITATIVE: D-Dimer, Quant: 1.08 ug/mL-FEU — ABNORMAL HIGH (ref 0.00–0.50)

## 2018-12-24 MED ORDER — ASCORBIC ACID 500 MG PO TABS: 500.0000 mg | ORAL_TABLET | Freq: Every day | ORAL | 0 refills | Status: AC

## 2018-12-24 MED ORDER — PANTOPRAZOLE SODIUM 40 MG PO TBEC: 40.0000 mg | DELAYED_RELEASE_TABLET | Freq: Every day | ORAL | 0 refills | Status: DC

## 2018-12-24 MED ORDER — DEXAMETHASONE 2 MG PO TABS: ORAL_TABLET | ORAL | 0 refills | Status: DC

## 2018-12-24 NOTE — Progress Notes (Signed)
Spoke with brother Simona Huh, stated he would be at hospital at apx 1630 with cloths. 12/24/2018 [place], RN

## 2018-12-24 NOTE — Plan of Care (Signed)
Pt updated on POC, pt verbalized understanding but will need reinforcement in teaching.

## 2018-12-24 NOTE — TOC Transition Note (Signed)
Transition of Care [facility]) - CM/SW Discharge Note   Patient Details  Name: WALLEY MARSH MRN: [id number] Date of Birth: 1960-12-01  Transition of Care (TOC) CM/SW Contact:  Joaquin Courts, RN Phone Number: 12/24/2018, 2:48 PM   Clinical Narrative:    CM spoke with Haze Rushing, RN at group home and provided her with updated FL2 and SLP eval results as requested.  Received confirmation that everything is in place for patient to return today. Haze Rushing does state that the group home utilizes an interdisciplinary team that includes PT and services will be provided to the patient. Patient brother will transport patient to group home at dc.  Bedside RN please call report to Haze Rushing RN at [phone].    Final next level of care: Group Home Barriers to Discharge: No Barriers Identified   Patient Goals and CMS Choice Patient states their goals for this hospitalization and ongoing recovery are:: to return to group home      Discharge Placement                       Discharge Plan and Services   Discharge Planning Services: CM Consult            DME Arranged: N/A DME Agency: NA       HH Arranged: NA HH Agency: NA        Social Determinants of Health (SDOH) Interventions     Readmission Risk Interventions No flowsheet data found.

## 2018-12-24 NOTE — Progress Notes (Signed)
Assisted pt with dressing in cloths, transported down to brothers car via wheelchair without complications.  NAD noted at this time.  Brother to [employer] to take pt to group home per [employer] and [place].

## 2018-12-24 NOTE — NC FL2 (Addendum)
[place] LEVEL OF CARE SCREENING TOOL     IDENTIFICATION  [name] Birthdate: 1960-08-05 Sex: male Admission Date (Current Location): 12/20/2018  [facility] and [place] Number:  Herbalist and Address:  [facility], [place] [address] [place], [place] [address]([facility])      Provider Number: [id number]  Attending Physician Name and Address:  Bonnielee Haff, MD  Relative Name and Phone Number:       Current Level of Care: Hospital Recommended Level of Care: Other (Comment)(Group home) Prior Approval Number:    Date Approved/Denied:   PASRR Number:    Discharge Plan: Other (Comment)(group home)    Current Diagnoses: Patient Active Problem List   Diagnosis Date Noted  . Multifocal pneumonia 12/20/2018  . Seizure disorder ([place]) 12/20/2018  . COVID-19 virus infection 12/20/2018  . Acute respiratory failure with hypoxia ([place]) 12/20/2018  . Essential hypertension 12/20/2018  . Hyponatremia 12/20/2018  . Pancytopenia ([place]) 12/20/2018  . Retarded communication skills 12/20/2018  . Pneumonia due to COVID-19 virus 12/20/2018  . CKD (chronic kidney disease) stage 3, GFR 30-59 ml/min 12/20/2018    Orientation RESPIRATION BLADDER Height & Weight     Self, Time, Situation, Place  Normal Incontinent Weight: 82.3 kg Height:  5\' 11"  (180.3 cm)(per patient)  BEHAVIORAL SYMPTOMS/MOOD NEUROLOGICAL BOWEL NUTRITION STATUS    Convulsions/Seizures Incontinent Diet  AMBULATORY STATUS COMMUNICATION OF NEEDS Skin   Independent Verbally Normal                       Personal Care Assistance Level of Assistance  Bathing, Dressing, Feeding Bathing Assistance: Limited assistance Feeding assistance: Limited assistance Dressing Assistance: Limited assistance     Functional Limitations Info             SPECIAL CARE FACTORS FREQUENCY                       Contractures Contractures Info: Not  present    Additional Factors Info  Code Status, Allergies Code Status Info: DNR Allergies Info: Dilantin, Keppra, lisinopril           Current Medications (12/24/2018):  This is the current hospital active medication list     Discharge Medications:  Medication List    STOP taking these medications   dexamethasone 10 MG/ML injection Commonly known as: DECADRON     TAKE these medications   amLODipine 10 MG tablet Commonly known as: NORVASC Take 10 mg by mouth daily.   ascorbic acid 500 MG tablet Commonly known as: VITAMIN C Take 1 tablet (500 mg total) by mouth daily for 10 days. Start taking on: December 25, 2018   clonazePAM 0.5 MG disintegrating tablet Commonly known as: KLONOPIN Take 0.5 mg by mouth 3 (three) times daily.   cloNIDine 0.2 mg/24hr patch Commonly known as: CATAPRES - Dosed in mg/24 hr Place 0.2 mg onto the skin once a week.   dexamethasone 2 MG tablet Commonly known as: DECADRON Take 2 tablets once daily for 3 days, then 1 tablet once daily for 3 days, then STOP.   ferrous sulfate 325 (65 FE) MG tablet Take 325 mg by mouth daily.   Fish Oil 1000 MG Caps Take 2,000 mg by mouth daily.   guaiFENesin 600 MG 12 hr tablet Commonly known as: MUCINEX Take 1 tablet (600 mg total) by mouth 2 (two) times daily.   lamoTRIgine 25 MG tablet Commonly  known as: LAMICTAL Take 75 mg by mouth at bedtime. Take in addition to 100 mg tablet for a total dose of 175 mg   lamoTRIgine 100 MG tablet Commonly known as: LAMICTAL Take 100 mg by mouth 2 (two) times daily.   loratadine 10 MG tablet Commonly known as: CLARITIN Take 10 mg by mouth daily.   magnesium oxide 400 MG tablet Commonly known as: MAG-OX Take 400 mg by mouth daily.   Melatonin 3 MG Tabs Take 3 mg by mouth every evening.   pantoprazole 40 MG tablet Commonly known as: PROTONIX Take 1 tablet (40 mg total) by mouth daily for 14 days.   polyethylene glycol 17 g  packet Commonly known as: MIRALAX / GLYCOLAX Take 17 g by mouth 2 (two) times daily.   PreserVision AREDS 2 Caps Take 1 capsule by mouth 2 (two) times daily.   QUEtiapine 25 MG tablet Commonly known as: SEROQUEL Take 25 mg by mouth daily as needed for agitation.   QUEtiapine 100 MG tablet Commonly known as: SEROQUEL Take 150 mg by mouth 3 (three) times daily.   senna 8.6 MG Tabs tablet Commonly known as: SENOKOT Take 2 tablets by mouth at bedtime as needed for mild constipation.       Relevant Imaging Results:  Relevant Lab Results:   Additional Information    Joaquin Courts, RN

## 2018-12-24 NOTE — Discharge Summary (Addendum)
[facility]  Physician Discharge Summary   Patient ID: Frank Conley MRN: [id number] DOB/AGE: 56-Jan-1962 56 y.o.  Admit date: 12/20/2018 Discharge date: 12/24/2018  PCP: Samuel Bouche, MD  DISCHARGE DIAGNOSES:  Pneumonia due to COVID-19 Aspiration pneumonia Dysphagia Normocytic anemia Acute kidney injury on chronic kidney disease stage III, improved Hyperglycemia due to steroids History of seizure disorder Developmental disability Essential hypertension History of sick sinus syndrome status post pacemaker   RECOMMENDATIONS FOR OUTPATIENT FOLLOW UP: 1. Follow-up with PCP in 1 to 2 weeks. 2. Aspiration precautions 3. Home health   Home Health: PT and OT Equipment/Devices: 3n 1   CODE STATUS: DNR  DISCHARGE CONDITION: fair  Diet recommendation: Dysphagia 3 diet.  Thin liquids.  Aspiration precautions.  INITIAL HISTORY: 58 year old male with a past medical history of mental disability who lives in a group home, also has a history of essential hypertension, seizure disorder, MGUS, sick sinus syndrome status post pacemaker who presented with cough shortness of breath.  Was seen by his primary care provider and a swab was sent for COVID-19.  It came back positive.  Patient was sent over to the emergency department.  Chest x-ray showed multifocal pneumonia.  Patient was hospitalized for further management.   HOSPITAL COURSE:   Pneumonia due to COVID-19/aspiration pneumonia Patient was hospitalized.  Patient was placed on steroids and remdesivir.  Due to concern for secondary bacterial infection he was also placed on antibacterial agents.  His procalcitonin was noted to be elevated.  Inflammatory markers were elevated with CRP as high as 23.1.  Improved to 4.4 today.  Patient's respiratory status has improved.  He saturating normal on room air.  He will complete the course of his antibacterials as well as remdesivir today.  He will be discharged on tapering doses  of steroids.  D-dimer was only minimally elevated.  No DVT on lower extremity Doppler studies.  Was not given Lovenox initially due to concern for GI bleed.    Dysphagia/aspiration Seen by speech therapy.  He underwent modified barium swallow.  He is on a dysphagia 3 diet.  Follow aspiration precautions.    Normocytic anemia Hemoglobin is stable.  Anemia panel reviewed.  No clear-cut deficiencies identified.  Questionable black stool No overt bleeding has been noted.  No melanotic stool.  Dark stool noted initially may have been due to iron tablets.  Patient's hemoglobin has been stable.  No further work-up at this time.  Continue PPI.     Hyponatremia Normal now  Acute kidney injury on chronic kidney disease stage III Renal function is back to normal.  Monitor urine output.  IV fluids were discontinued.    Hyperglycemia No documented history of diabetes.  Elevated CBG most likely due to steroids.  HbA1c is 5.1.  CBG should improve as steroid is tapered down.  History of seizure disorder Continue Lamictal  Essential hypertension Noted to be on clonidine patch and amlodipine which is being continued.  Blood pressure is reasonably well controlled.  Developmental disability Continue Seroquel and Klonopin.  He lives in a group home setting.  Sick sinus syndrome status post pacemaker Last pacemaker check was in October.  Mild thrombocytopenia Platelet count is now normal  Overall stable.  Okay for discharge back to his group home today.     PERTINENT LABS:  The results of significant diagnostics from this hospitalization (including imaging, microbiology, ancillary and laboratory) are listed below for reference.    Microbiology: Recent Results (from the past 240 hour(s))  Blood Culture (routine x 2)     Status: None (Preliminary result)   Collection Time: 12/20/18 12:12 AM   Specimen: BLOOD  Result Value Ref Range Status   Specimen Description BLOOD BLOOD RIGHT  HAND  Final   Special Requests   Final    BOTTLES DRAWN AEROBIC AND ANAEROBIC Blood Culture results may not be optimal due to an excessive volume of blood received in culture bottles   Culture   Final    NO GROWTH 4 DAYS Performed at [facility], [address] [place], [place] [address]    Report Status PENDING  Incomplete  SARS CORONAVIRUS 2 (TAT 6-24 HRS) Nasopharyngeal Nasopharyngeal Swab     Status: Abnormal   Collection Time: 12/20/18 12:16 AM   Specimen: Nasopharyngeal Swab  Result Value Ref Range Status   SARS Coronavirus 2 POSITIVE (A) NEGATIVE Final    Comment: RESULT CALLED TO, READ BACK BY AND VERIFIED WITH: L. GANNON, [facility]) AT 1410 ON 12/20/18 BY C. JESSUP, MT. (NOTE) SARS-CoV-2 target nucleic acids are DETECTED. The SARS-CoV-2 RNA is generally detectable in upper and lower respiratory specimens during the acute phase of infection. Positive results are indicative of active infection with SARS-CoV-2. Clinical  correlation with patient history and other diagnostic information is necessary to determine patient infection status. Positive results do  not rule out bacterial infection or co-infection with other viruses. The expected result is Negative. Fact Sheet for Patients: SugarRoll.be Fact Sheet for Healthcare Providers: https://www.woods-mathews.com/ This test is not yet approved or cleared by the [place] FDA and  has been authorized for detection and/or diagnosis of SARS-CoV-2 by FDA under an Emergency Use Authorization (EUA). This EUA will remain  in effect (meaning this  test can be used) for the duration of the COVID-19 declaration under Section 564(b)(1) of the Act, 21 U.S.C. section 360bbb-3(b)(1), unless the authorization is terminated or revoked sooner. Performed at [facility], [place] [address], [place], [place] [address]   Blood Culture (routine x 2)     Status: None (Preliminary result)    Collection Time: 12/20/18 12:30 AM   Specimen: BLOOD  Result Value Ref Range Status   Specimen Description BLOOD RIGHT ANTECUBITAL  Final   Special Requests   Final    BOTTLES DRAWN AEROBIC AND ANAEROBIC Blood Culture results may not be optimal due to an excessive volume of blood received in culture bottles   Culture   Final    NO GROWTH 4 DAYS Performed at [facility], [address] [place], [place] [address]    Report Status PENDING  Incomplete  Urine culture     Status: None   Collection Time: 12/20/18  3:01 AM   Specimen: In/Out Cath Urine  Result Value Ref Range Status   Specimen Description   Final    IN/OUT CATH URINE Performed at [facility], [address] [place], [place] [address]    Special Requests   Final    NONE Performed at [facility], [address] [place], [place] [address]    Culture   Final    NO GROWTH Performed at [facility], [place] [address], [place], [place] [address]    Report Status 12/21/2018 FINAL  Final     Labs:  COVID-19 Labs  Recent Labs    12/22/18 0352 12/23/18 0437 12/24/18 0010  DDIMER 0.92* 1.05* 1.08*  FERRITIN 456*  --   --   CRP 8.8* 5.2* 4.4*  Lab Results  Component Value Date   SARSCOV2NAA POSITIVE (A) 12/20/2018      Basic Metabolic Panel: Recent Labs  Lab 12/20/18 0535 12/21/18 0124 12/22/18 0352 12/23/18 0437 12/24/18 0010  NA 130* 133* 134* 136 137  K 4.7 4.0 4.4 4.7 4.6  CL 99 102 107 108 107  CO2 23 21* 20* 19* 20*  GLUCOSE 128* 189* 163* 125* 95  BUN 24* 27* 34* 37* 46*  CREATININE 1.37* 0.97 0.99 1.07 1.05  CALCIUM 7.4* 7.2* 7.5* 7.8* 7.9*   Liver Function Tests: Recent Labs  Lab 12/20/18 0016 12/21/18 0124 12/22/18 0352 12/23/18 0437 12/24/18 0010  AST 37 34 27 31 51*  ALT 21 21 18 21  38  ALKPHOS 79 68 72 70 76  BILITOT 0.7 0.3 0.3 0.3 0.5  PROT 6.7 5.7* 5.5* 5.6* 6.6  ALBUMIN 3.5 2.8* 2.6* 2.7* 3.3*   Recent Labs  Lab 12/20/18 0016    LIPASE 24   CBC: Recent Labs  Lab 12/20/18 0016 12/20/18 0535 12/21/18 0124 12/22/18 0352 12/23/18 0437 12/24/18 0010  WBC 4.5 4.7 4.4 6.0 5.6 5.8  NEUTROABS 3.6  --  3.7 4.9 4.1 4.0  HGB 10.4* 10.2* 10.4* 11.0* 11.8* 12.6*  HCT 30.4* 29.0* 30.4* 32.9* 35.2* 37.9*  MCV 93.8 90.9 94.4 95.4 95.1 95.7  PLT 106* 103* 117* 176 181 227    CBG: Recent Labs  Lab 12/23/18 1210 12/23/18 1607 12/23/18 1936 12/24/18 0745 12/24/18 1152  GLUCAP 148* 187* 148* 106* 263*     IMAGING STUDIES Dg Chest 2 View  Result Date: 12/19/2018 CLINICAL DATA:  Cough. 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 The heart size is borderline enlarged. There is no pneumothorax. No large pleural effusion. There is scattered hazy airspace opacities bilaterally, greatest within the left lower lung zone. There is no acute osseous abnormality. IMPRESSION: Bilateral hazy airspace opacities, greatest within the left lower lung zone. Findings may reflect multifocal pneumonia or asymmetric edema. Electronically Signed   By: Constance Holster M.D.   On: 12/19/2018 23:19   US Venous Img Lower Bilateral (dvt)  Result Date: 12/20/2018 CLINICAL DATA:  Elevated D-dimer.  Evaluate for DVT. EXAM: BILATERAL LOWER EXTREMITY VENOUS DOPPLER ULTRASOUND TECHNIQUE: Gray-scale sonography with graded compression, as well as color Doppler and duplex ultrasound were performed to evaluate the lower extremity deep venous systems from the level of the common femoral vein and including the common femoral, femoral, profunda femoral, popliteal and calf veins including the posterior tibial, peroneal and gastrocnemius veins when visible. The superficial great saphenous vein was also interrogated. Spectral Doppler was utilized to evaluate flow at rest and with distal augmentation maneuvers in the common femoral, femoral and popliteal veins. COMPARISON:  Left lower extremity venous  doppler ultrasound - 09/21/2012 FINDINGS: RIGHT LOWER EXTREMITY Common Femoral Vein: No evidence of thrombus. Normal compressibility, respiratory phasicity and response to augmentation. Saphenofemoral Junction: No evidence of thrombus. Normal compressibility and flow on color Doppler imaging. Profunda Femoral Vein: No evidence of thrombus. Normal compressibility and flow on color Doppler imaging. Femoral Vein: No evidence of thrombus. Normal compressibility, respiratory phasicity and response to augmentation. Popliteal Vein: No evidence of thrombus. Normal compressibility, respiratory phasicity and response to augmentation. Calf Veins: No evidence of thrombus. Normal compressibility and flow on color Doppler imaging. Superficial Great Saphenous Vein: No evidence of thrombus. Normal compressibility. Venous Reflux:  None. Other Findings:  None. LEFT LOWER EXTREMITY Common Femoral Vein: No  evidence of thrombus. Normal compressibility, respiratory phasicity and response to augmentation. Saphenofemoral Junction: No evidence of thrombus. Normal compressibility and flow on color Doppler imaging. Profunda Femoral Vein: No evidence of thrombus. Normal compressibility and flow on color Doppler imaging. Femoral Vein: No evidence of thrombus. Normal compressibility, respiratory phasicity and response to augmentation. Popliteal Vein: No evidence of thrombus. Normal compressibility, respiratory phasicity and response to augmentation. Calf Veins: No evidence of thrombus. Normal compressibility and flow on color Doppler imaging. Superficial Great Saphenous Vein: No evidence of thrombus. Normal compressibility. Venous Reflux:  None. Other Findings:  None. IMPRESSION: No evidence of deep venous thrombosis within either lower extremity. Electronically Signed   By: Sandi Mariscal M.D.   On: 12/20/2018 07:13   Dg Chest Port 1 View  Result Date: 12/22/2018 CLINICAL DATA:  COVID positive, shortness of breath EXAM: PORTABLE CHEST 1 VIEW  COMPARISON:  12/19/2018 FINDINGS: Low volume AP portable examination with an interval increase in heterogeneous airspace opacity, consolidation, and probable small pleural effusion of the left lung base, and new consolidation or atelectasis at the right lung base. Unchanged cardiomegaly with right chest multi lead pacer. IMPRESSION: 1. Low volume AP portable examination with an interval increase in heterogeneous airspace opacity, consolidation, and probable small pleural effusion of the left lung base, and new consolidation or atelectasis at the right lung base. Findings are consistent with worsened infection. 2.  Unchanged cardiomegaly with right chest multi lead pacer. Electronically Signed   By: Eddie Candle M.D.   On: 12/22/2018 16:05   Dg Swallowing Func-speech Pathology  Result Date: 12/23/2018 Objective Swallowing Evaluation: Type of Study: MBS-Modified Barium Swallow Study  Patient Details Name: HARSHDEEP SUMAN MRN: [id number] Date of Birth: Dec 28, 1960 Today's Date: 12/23/2018 Time: SLP Start Time (ACUTE ONLY): 1215 -SLP Stop Time (ACUTE ONLY): 1300 SLP Time Calculation (min) (ACUTE ONLY): 45 min Past Medical History: Past Medical History: Diagnosis Date  Depression   Hypertension   MGUS (monoclonal gammopathy of unknown significance)   Pervasive developmental disorder   Seizures (HCC)   SSS (sick sinus syndrome) (HCC)   PPM placed Past Surgical History: Past Surgical History: Procedure Laterality Date  PACEMAKER IMPLANT   HPI: 58 year old male with a past medical history of mental disability who lives in a group home, also has a history of essential hypertension, seizure disorder, MGUS, sick sinus syndrome status post pacemaker who presented with cough shortness of breath.  Was seen by his primary care provider and a swab was sent for COVID-19.  It came back positive.  Patient was sent over to the emergency department.  Chest x-ray showed multifocal pneumonia.  Subjective: alert, cooperative  Assessment / Plan / Recommendation CHL IP CLINICAL IMPRESSIONS 12/23/2018 Clinical Impression Pt presents with a mild dysphagia due in large part to osteophytes at C4-6. Under typical circumstances, when healthy, he is likely better able to compensate for this structural impedence.  Oral phase of swallow was normal.  As materials passed into pharynx, they tended to sit on the shelf created by osteophytes.  After the swallow, some of this residue was observed to spill into the larynx.  Amount of penetrate was trace, and when it reached the vocal folds, pt cleared his throat or coughed, which effectively expelled the penetrate from the laryngeal vestibule. There was no observed aspiration.  There appeared to be mild decrease in pharyngeal squeeze, potentially contributing to residue.  For now, recommend advancing diet to mechanical soft/dysphagia 3, thin liquids; crush meds in puree to avoid lodging  above UES.  Results explained to pt and RN.  SLP will continue to follow for education/safety.  SLP Visit Diagnosis Dysphagia, oropharyngeal phase (R13.12) Attention and concentration deficit following -- Frontal lobe and executive function deficit following -- Impact on safety and function Mild aspiration risk   CHL IP TREATMENT RECOMMENDATION 12/23/2018 Treatment Recommendations Therapy as outlined in treatment plan below   No flowsheet data found. CHL IP DIET RECOMMENDATION 12/23/2018 SLP Diet Recommendations Dysphagia 3 (Mech soft) solids;Thin liquid Liquid Administration via Cup;Straw Medication Administration Crushed with puree Compensations Slow rate;Small sips/bites Postural Changes --   CHL IP OTHER RECOMMENDATIONS 12/23/2018 Recommended Consults -- Oral Care Recommendations Oral care BID Other Recommendations --   CHL IP FOLLOW UP RECOMMENDATIONS 12/23/2018 Follow up Recommendations Other (comment)   CHL IP FREQUENCY AND DURATION 12/23/2018 Speech Therapy Frequency (ACUTE ONLY) min 2x/week Treatment Duration 2  weeks      CHL IP ORAL PHASE 12/23/2018 Oral Phase WFL Oral - Pudding Teaspoon -- Oral - Pudding Cup -- Oral - Honey Teaspoon -- Oral - Honey Cup -- Oral - Nectar Teaspoon -- Oral - Nectar Cup -- Oral - Nectar Straw -- Oral - Thin Teaspoon -- Oral - Thin Cup -- Oral - Thin Straw -- Oral - Puree -- Oral - Mech Soft -- Oral - Regular -- Oral - Multi-Consistency -- Oral - Pill -- Oral Phase - Comment --  CHL IP PHARYNGEAL PHASE 12/23/2018 Pharyngeal Phase Impaired Pharyngeal- Pudding Teaspoon -- Pharyngeal -- Pharyngeal- Pudding Cup -- Pharyngeal -- Pharyngeal- Honey Teaspoon -- Pharyngeal -- Pharyngeal- Honey Cup -- Pharyngeal -- Pharyngeal- Nectar Teaspoon -- Pharyngeal -- Pharyngeal- Nectar Cup Delayed swallow initiation-vallecula;Reduced pharyngeal peristalsis;Pharyngeal residue - valleculae;Pharyngeal residue - pyriform Pharyngeal -- Pharyngeal- Nectar Straw -- Pharyngeal -- Pharyngeal- Thin Teaspoon -- Pharyngeal -- Pharyngeal- Thin Cup -- Pharyngeal -- Pharyngeal- Thin Straw Delayed swallow initiation-vallecula;Reduced pharyngeal peristalsis;Pharyngeal residue - valleculae;Pharyngeal residue - pyriform;Penetration/Apiration after swallow Pharyngeal Material enters airway, CONTACTS cords and then ejected out Pharyngeal- Puree Delayed swallow initiation-vallecula;Reduced pharyngeal peristalsis;Pharyngeal residue - valleculae;Pharyngeal residue - pyriform Pharyngeal -- Pharyngeal- Mechanical Soft Delayed swallow initiation-vallecula;Reduced pharyngeal peristalsis;Pharyngeal residue - valleculae;Pharyngeal residue - pyriform Pharyngeal -- Pharyngeal- Regular -- Pharyngeal -- Pharyngeal- Multi-consistency -- Pharyngeal -- Pharyngeal- Pill -- Pharyngeal -- Pharyngeal Comment --  No flowsheet data found. [name] 12/23/2018, 1:32 PM               DISCHARGE EXAMINATION: Vitals:   12/23/18 1935 12/24/18 0353 12/24/18 0410 12/24/18 0745  BP: 125/78  125/80 132/88  Pulse: 60 63 60 72  Resp: 16  16 18    Temp: 97.6 F (36.4 C)  97.7 F (36.5 C) (!) 97.4 F (36.3 C)  TempSrc: Oral  Oral Oral  SpO2: 94% 95%  98%  Weight:      Height:       General appearance: Awake alert.  In no distress.  Mildly distracted Resp: Clear to auscultation bilaterally.  Normal effort Cardio: S1-S2 is normal regular.  No S3-S4.  No rubs murmurs or bruit GI: Abdomen is soft.  Nontender nondistended.  Bowel sounds are present normal.  No masses organomegaly   DISPOSITION: Back to group home  Discharge Instructions    Call MD for:  difficulty breathing, headache or visual disturbances   Complete by: As directed    Call MD for:  extreme fatigue   Complete by: As directed    Call MD for:  persistant dizziness or light-headedness   Complete by: As directed  Call MD for:  severe uncontrolled pain   Complete by: As directed    Call MD for:  temperature >100.4   Complete by: As directed    Discharge diet:   Complete by: As directed    Dysphagia 3 diet with thin liquids.  Aspiration precautions.   Discharge instructions   Complete by: As directed    COVID Independence are felt to be stable enough to no longer require inpatient monitoring, testing, and treatment, though you will need to follow the recommendations below: - Based on the CDC's non-test criteria for ending self-isolation: You may not return to work/leave the home until at least 21 days since symptom onset AND 3 days without a fever (without taking tylenol, ibuprofen, etc.) AND have improvement in respiratory symptoms. - Do not take NSAID medications (including, but not limited to, ibuprofen, advil, motrin, naproxen, aleve, goody's powder, etc.) - Follow up with your doctor in the next week via telehealth or seek medical attention right away if your symptoms get WORSE.  - Consider donating plasma after you have recovered (either 14 days after a negative test or 28 days after symptoms have completely resolved) because your antibodies to  this virus may be helpful to give to others with life-threatening infections. Please go to the website www.oneblood.org if you would like to consider volunteering for plasma donation.    Directions for you at home:  Wear a facemask You should wear a facemask that covers your nose and mouth when you are in the same room with other people and when you visit a healthcare provider. People who live with or visit you should also wear a facemask while they are in the same room with you.  Separate yourself from other people in your home As much as possible, you should stay in a different room from other people in your home. Also, you should use a separate bathroom, if available.  Avoid sharing household items You should not share dishes, drinking glasses, cups, eating utensils, towels, bedding, or other items with other people in your home. After using these items, you should wash them thoroughly with soap and water.  Cover your coughs and sneezes Cover your mouth and nose with a tissue when you cough or sneeze, or you can cough or sneeze into your sleeve. Throw used tissues in a lined trash can, and immediately wash your hands with soap and water for at least 20 seconds or use an alcohol-based hand rub.  Wash your [facility] your hands often and thoroughly with soap and water for at least 20 seconds. You can use an alcohol-based hand sanitizer if soap and water are not available and if your hands are not visibly dirty. Avoid touching your eyes, nose, and mouth with unwashed hands.  Directions for those who live with, or provide care at home for you:  Limit the number of people who have contact with the patient If possible, have only one caregiver for the patient. Other household members should stay in another home or place of residence. If this is not possible, they should stay in another room, or be separated from the patient as much as possible. Use a separate bathroom, if  available. Restrict visitors who do not have an essential need to be in the home.  Ensure good ventilation Make sure that shared spaces in the home have good air flow, such as from an air conditioner or an opened window, weather permitting.  Wash your hands often  Wash your hands often and thoroughly with soap and water for at least 20 seconds. You can use an alcohol based hand sanitizer if soap and water are not available and if your hands are not visibly dirty. Avoid touching your eyes, nose, and mouth with unwashed hands. Use disposable paper towels to dry your hands. If not available, use dedicated cloth towels and replace them when they become wet.  Wear a facemask and gloves Wear a disposable facemask at all times in the room and gloves when you touch or have contact with the patients blood, body fluids, and/or secretions or excretions, such as sweat, saliva, sputum, nasal mucus, vomit, urine, or feces.  Ensure the mask fits over your nose and mouth tightly, and do not touch it during use. Throw out disposable facemasks and gloves after using them. Do not reuse. Wash your hands immediately after removing your facemask and gloves. If your personal clothing becomes contaminated, carefully remove clothing and launder. Wash your hands after handling contaminated clothing. Place all used disposable facemasks, gloves, and other waste in a lined container before disposing them with other household waste. Remove gloves and wash your hands immediately after handling these items.  Do not share dishes, glasses, or other household items with the patient Avoid sharing household items. You should not share dishes, drinking glasses, cups, eating utensils, towels, bedding, or other items with a patient who is confirmed to have, or being evaluated for, COVID-19 infection. After the person uses these items, you should wash them thoroughly with soap and water.  Wash laundry thoroughly Immediately remove  and wash clothes or bedding that have blood, body fluids, and/or secretions or excretions, such as sweat, saliva, sputum, nasal mucus, vomit, urine, or feces, on them. Wear gloves when handling laundry from the patient. Read and follow directions on labels of laundry or clothing items and detergent. In general, wash and dry with the warmest temperatures recommended on the label.  Clean all areas the individual has used often Clean all touchable surfaces, such as counters, tabletops, doorknobs, bathroom fixtures, toilets, phones, keyboards, tablets, and bedside tables, every day. Also, clean any surfaces that may have blood, body fluids, and/or secretions or excretions on them. Wear gloves when cleaning surfaces the patient has come in contact with. Use a diluted bleach solution (e.g., dilute bleach with 1 part bleach and 10 parts water) or a household disinfectant with a label that says EPA-registered for coronaviruses. To make a bleach solution at home, add 1 tablespoon of bleach to 1 quart (4 cups) of water. For a larger supply, add  cup of bleach to 1 gallon (16 cups) of water. Read labels of cleaning products and follow recommendations provided on product labels. Labels contain instructions for safe and effective use of the cleaning product including precautions you should take when applying the product, such as wearing gloves or eye protection and making sure you have good ventilation during use of the product. Remove gloves and wash hands immediately after cleaning.  Monitor yourself for signs and symptoms of illness Caregivers and household members are considered close contacts, should monitor their health, and will be asked to limit movement outside of the home to the extent possible. Follow the monitoring steps for close contacts listed on the symptom monitoring form.   If you have additional questions, contact your local health department or call the epidemiologist on call at  [phone] (available 24/7). This guidance is subject to change. For the most up-to-date guidance from [facility], please refer  to their website: [email or web address]   You were cared for by a hospitalist during your hospital stay. If you have any questions about your discharge medications or the care you received while you were in the hospital after you are discharged, you can call the unit and asked to speak with the hospitalist on call if the hospitalist that took care of you is not available. Once you are discharged, your primary care physician will handle any further medical issues. Please note that NO REFILLS for any discharge medications will be authorized once you are discharged, as it is imperative that you return to your primary care physician (or establish a relationship with a primary care physician if you do not have one) for your aftercare needs so that they can reassess your need for medications and monitor your lab values. If you do not have a primary care physician, you can call [phone] for a physician referral.   Increase activity slowly   Complete by: As directed        Allergies as of 12/24/2018      Reactions   Dilantin [phenytoin]    Keppra [levetiracetam]    Lisinopril       Medication List    STOP taking these medications   dexamethasone 10 MG/ML injection Commonly known as: DECADRON     TAKE these medications   amLODipine 10 MG tablet Commonly known as: NORVASC Take 10 mg by mouth daily.   ascorbic acid 500 MG tablet Commonly known as: VITAMIN C Take 1 tablet (500 mg total) by mouth daily for 10 days. Start taking on: December 25, 2018   clonazePAM 0.5 MG disintegrating tablet Commonly known as: KLONOPIN Take 0.5 mg by mouth 3 (three) times daily.   cloNIDine 0.2 mg/24hr patch Commonly known as: CATAPRES - Dosed in mg/24 hr Place 0.2 mg onto the skin once a week.   dexamethasone 2 MG  tablet Commonly known as: DECADRON Take 2 tablets once daily for 3 days, then 1 tablet once daily for 3 days, then STOP.   ferrous sulfate 325 (65 FE) MG tablet Take 325 mg by mouth daily.   Fish Oil 1000 MG Caps Take 2,000 mg by mouth daily.   guaiFENesin 600 MG 12 hr tablet Commonly known as: MUCINEX Take 1 tablet (600 mg total) by mouth 2 (two) times daily.   lamoTRIgine 25 MG tablet Commonly known as: LAMICTAL Take 75 mg by mouth at bedtime. Take in addition to 100 mg tablet for a total dose of 175 mg   lamoTRIgine 100 MG tablet Commonly known as: LAMICTAL Take 100 mg by mouth 2 (two) times daily.   loratadine 10 MG tablet Commonly known as: CLARITIN Take 10 mg by mouth daily.   magnesium oxide 400 MG tablet Commonly known as: MAG-OX Take 400 mg by mouth daily.   Melatonin 3 MG Tabs Take 3 mg by mouth every evening.   pantoprazole 40 MG tablet Commonly known as: PROTONIX Take 1 tablet (40 mg total) by mouth daily for 14 days.   polyethylene glycol 17 g packet Commonly known as: MIRALAX / GLYCOLAX Take 17 g by mouth 2 (two) times daily.   PreserVision AREDS 2 Caps Take 1 capsule by mouth 2 (two) times daily.   QUEtiapine 25 MG tablet Commonly known as: SEROQUEL Take 25 mg by mouth daily as needed for agitation.   QUEtiapine 100 MG tablet Commonly known as: SEROQUEL Take 150 mg by mouth 3 (three) times daily.   senna 8.6  MG Tabs tablet Commonly known as: SENOKOT Take 2 tablets by mouth at bedtime as needed for mild constipation.        Follow-up Information    Lamba, Deneen Harts, MD. Schedule an appointment as soon as possible for a visit.   Specialty: Internal Medicine Contact information: [address] [facility] [id number] [facility] [place] [address] [phone]           TOTAL DISCHARGE TIME: 41 minutes  [place]  [facility] Pager on [email or web address]  12/24/2018, 12:12 PM

## 2018-12-24 NOTE — Progress Notes (Signed)
Physical Therapy Treatment Patient Details Name: Frank Conley MRN: [id number] DOB: May 01, 1960 Today's Date: 12/24/2018    History of Present Illness Pt adm with covid PNA. PMH - intellectual disability, htn, seizure, pacer    PT Comments    Pt progressing with mobility. Pt with slow gait and moaning and groaning but denies pain and states he is just cold. At end of treatment pt given warm blanket.    Follow Up Recommendations  No PT follow up     Equipment Recommendations  None recommended by PT    Recommendations for Other Services       Precautions / Restrictions Precautions Precautions: Fall Restrictions Weight Bearing Restrictions: No    Mobility  Bed Mobility               General bed mobility comments: Pt up in chair  Transfers Overall transfer level: Needs assistance Equipment used: None Transfers: Sit to/from Stand Sit to Stand: Supervision         General transfer comment: Assist for lines/safety. Incr tiime  Ambulation/Gait Ambulation/Gait assistance: Supervision Gait Distance (Feet): 40 Feet Assistive device: None Gait Pattern/deviations: Step-through pattern;Decreased step length - right;Decreased step length - left Gait velocity: decr Gait velocity interpretation: <1.31 ft/sec, indicative of household ambulator General Gait Details: Assist for lines/safety. Distance limited by pt request to use the bathroom   Stairs             Wheelchair Mobility    Modified Rankin (Stroke Patients Only)       Balance Overall balance assessment: Needs assistance Sitting-balance support: No upper extremity supported;Feet supported Sitting balance-Leahy Scale: Fair     Standing balance support: No upper extremity supported;During functional activity Standing balance-Leahy Scale: Fair                              Cognition Arousal/Alertness: Awake/alert Behavior During Therapy: WFL for tasks assessed/performed Overall  Cognitive Status: History of cognitive impairments - at baseline                                        Exercises      General Comments General comments (skin integrity, edema, etc.): SpO2 >94% on RA with activity      Pertinent Vitals/Pain Pain Assessment: No/denies pain    Home Living                      Prior Function            PT Goals (current goals can now be found in the care plan section) Acute Rehab PT Goals Patient Stated Goal: not stated Progress towards PT goals: Progressing toward goals    Frequency    Min 3X/week      PT Plan Current plan remains appropriate    Co-evaluation              AM-PAC PT "6 Clicks" Mobility   Outcome Measure  Help needed turning from your back to your side while in a flat bed without using bedrails?: None Help needed moving from lying on your back to sitting on the side of a flat bed without using bedrails?: None Help needed moving to and from a bed to a chair (including a wheelchair)?: A Little Help needed standing up from a chair using your arms (e.g., wheelchair  or bedside chair)?: None Help needed to walk in hospital room?: A Little Help needed climbing 3-5 steps with a railing? : A Little 6 Click Score: 21    End of Session   Activity Tolerance: Patient tolerated treatment well Patient left: in chair;with call bell/phone within reach;with chair alarm set Nurse Communication: Mobility status PT Visit Diagnosis: Unsteadiness on feet (R26.81)     Time: [id number] PT Time Calculation (min) (ACUTE ONLY): 19 min  Charges:  $Gait Training: 8-22 mins                     [place] Pager [phone] Office [place] 12/24/2018, 1:20 PM

## 2018-12-24 NOTE — Discharge Instructions (Signed)

## 2018-12-24 NOTE — Progress Notes (Addendum)
  Speech Language Pathology Treatment: Dysphagia  Patient Details Name: Frank Conley MRN: [id number] DOB: 10-Nov-1960 Today's Date: 12/24/2018 Time: [id number] SLP Time Calculation (min) (ACUTE ONLY): 10 min  Assessment / Plan / Recommendation Clinical Impression  Pt preparing for D/C back to group home today.  We reviewed results of yesterday's MBS.  Pt is able to recall recommendations for small sips and to swallow twice with each bite of solid food. Observed with consumption of thin liquids with occasional verbal cues needed to slow rate - he demonstrates insight into his speed/impulsivity. Assisted pt to bedside commode for BM/clean up.    Spoke with Haze Rushing, RN at group home and reviewed results and recommendations from [facility]. She will get SLP f/u for Frank Conley so education/aspiration precautions are managed.        HPI HPI: 58 year old male with a past medical history of mental disability who lives in a group home, also has a history of essential hypertension, seizure disorder, MGUS, sick sinus syndrome status post pacemaker who presented with cough shortness of breath.  Was seen by his primary care provider and a swab was sent for COVID-19.  It came back positive.  Patient was sent over to the emergency department.  Chest x-ray showed multifocal pneumonia.       SLP Plan  Discharge SLP treatment due to (comment)(d/c back to group home)       Recommendations  Diet recommendations: Dysphagia 3 (mechanical soft);Thin liquid Liquids provided via: Cup Medication Administration: Crushed with puree Supervision: Patient able to self feed;Staff to assist with self feeding Compensations: Slow rate;Small sips/bites;Other (Comment)(swallow twice with each bite) Postural Changes and/or Swallow Maneuvers: Seated upright 90 degrees                Oral Care Recommendations: Oral care BID Follow up Recommendations: None SLP Visit Diagnosis: Dysphagia, oropharyngeal phase (R13.12) Plan:  Discharge SLP treatment due to (comment)(d/c back to group home)       [place]. Tivis Ringer, [place] Office number [phone] Communicate via secure chat   [name] 12/24/2018, 2:48 PM

## 2018-12-24 NOTE — Progress Notes (Signed)
Report was given to Gena Fray RN, all questions were answered.  12/24/2018 3:39 PM Dillon Bjork Sima Matas, RN

## 2018-12-25 LAB — CULTURE, BLOOD (ROUTINE X 2)
Culture: NO GROWTH
Culture: NO GROWTH

## 2021-03-18 IMAGING — US US EXTREM LOW VENOUS
1 series · 13 of 24 positions shown · non-contrast
Comparison: Left lower extremity venous doppler ultrasound -
09/21/2012

CLINICAL DATA: Elevated D-dimer.  Evaluate for DVT.



[Series 1: us extrem low venous · 0.09mm/px · 13 of 56 slices shown]
[im 1/56]
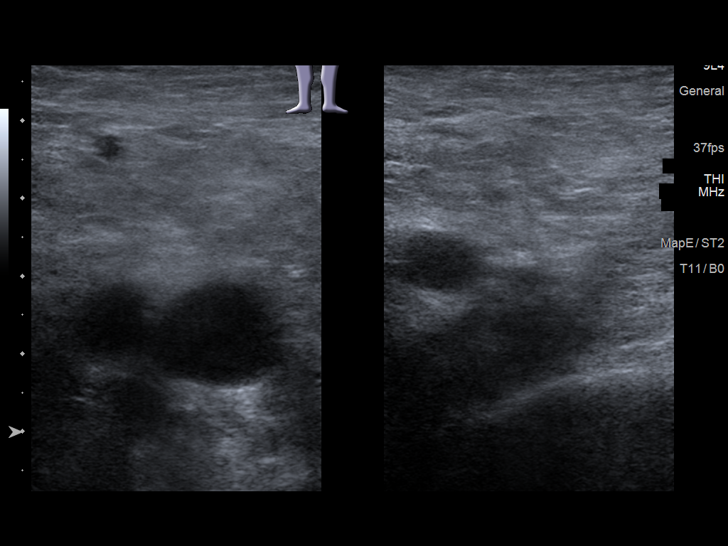
[im 5/56]
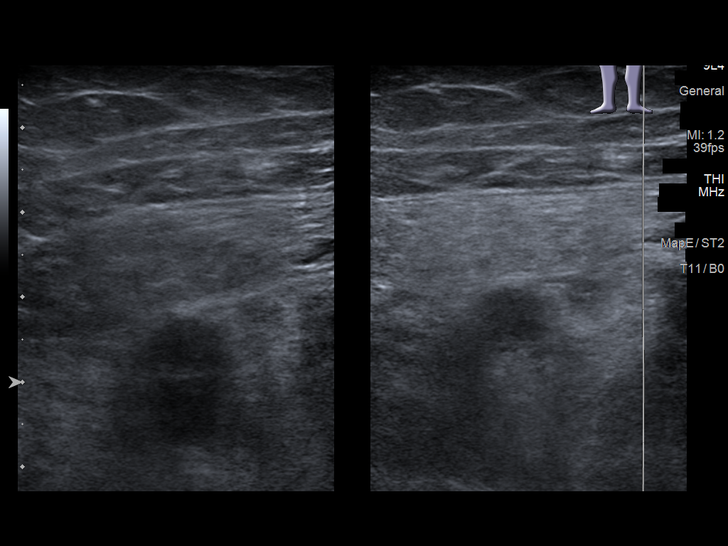
[im 10/56]
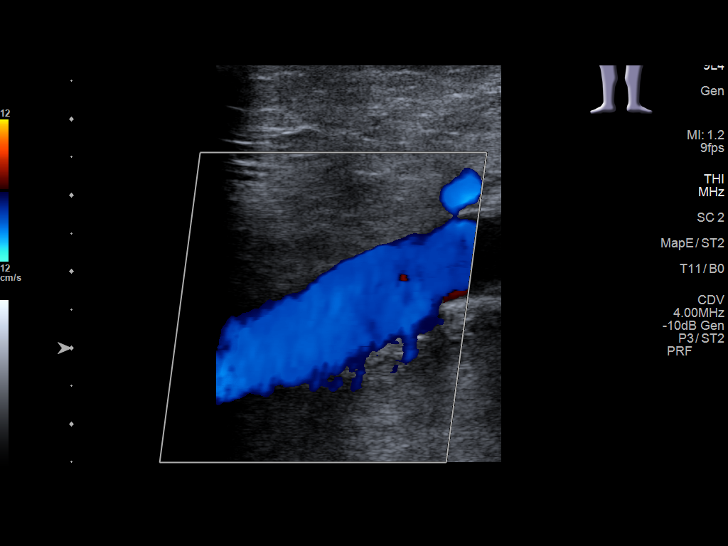
[im 15/56]
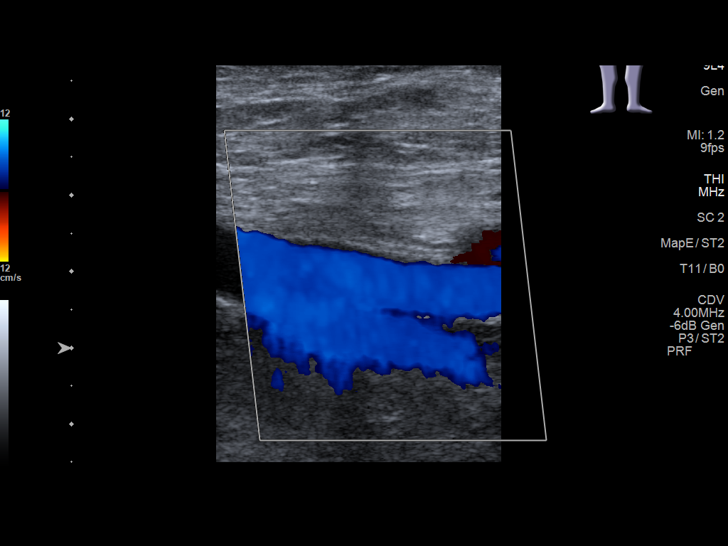
[im 20/56]
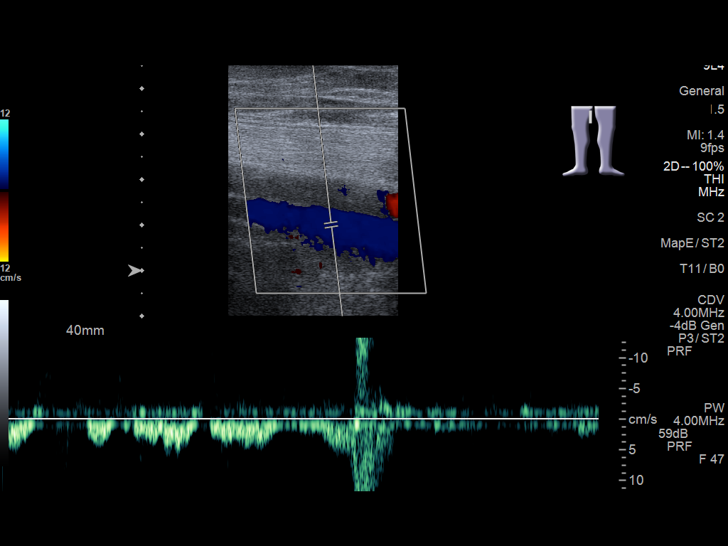
[im 24/56]
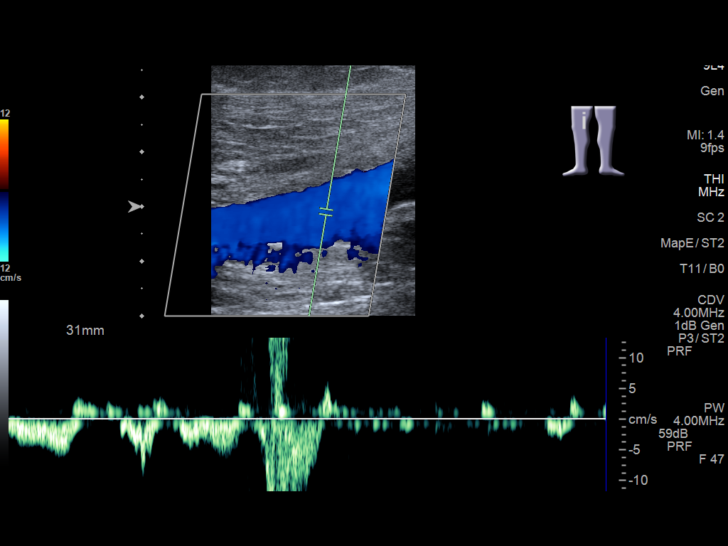
[im 29/56]
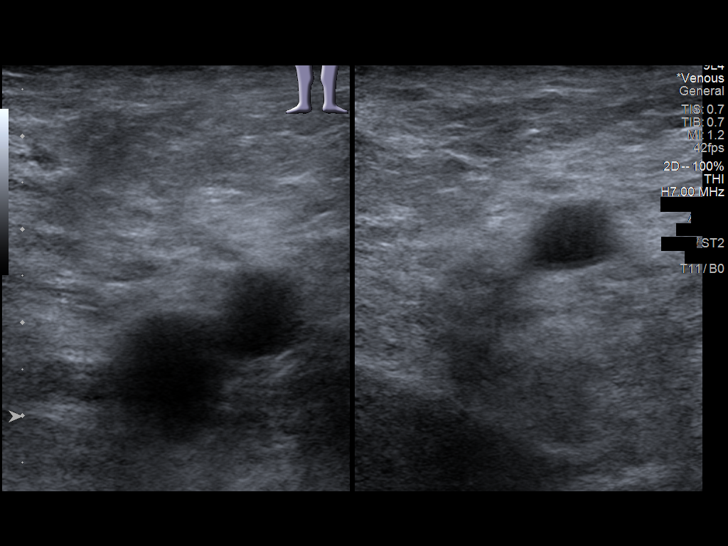
[im 32/56]
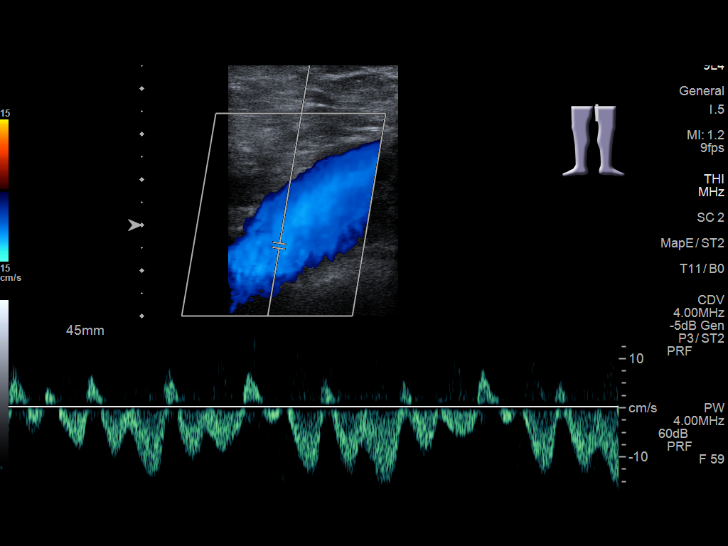
[im 36/56]
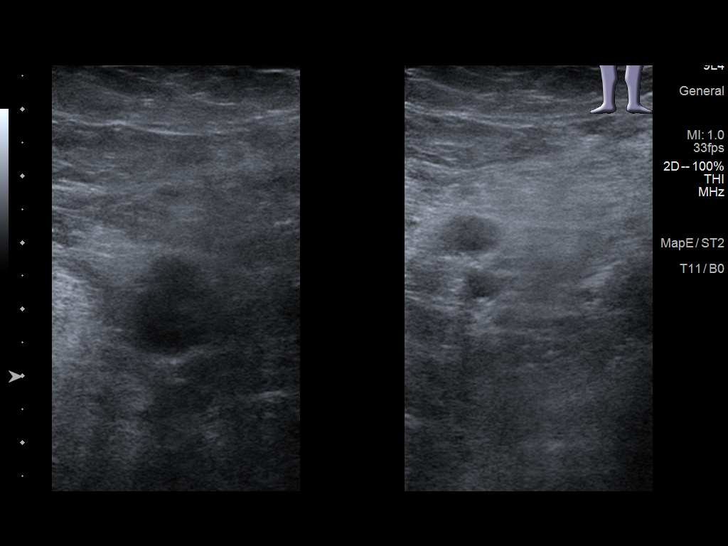
[im 41/56]
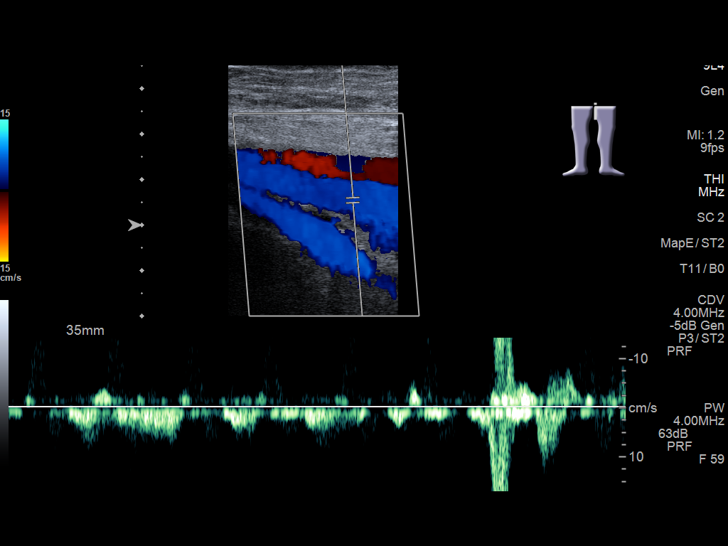
[im 46/56]
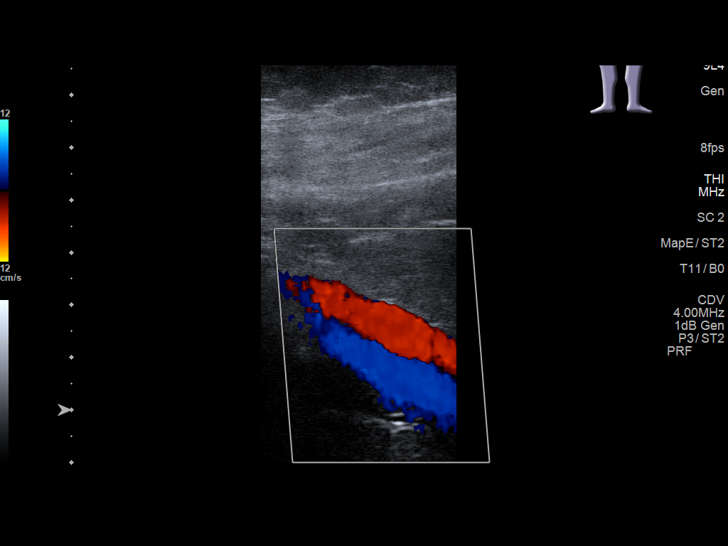
[im 51/56]
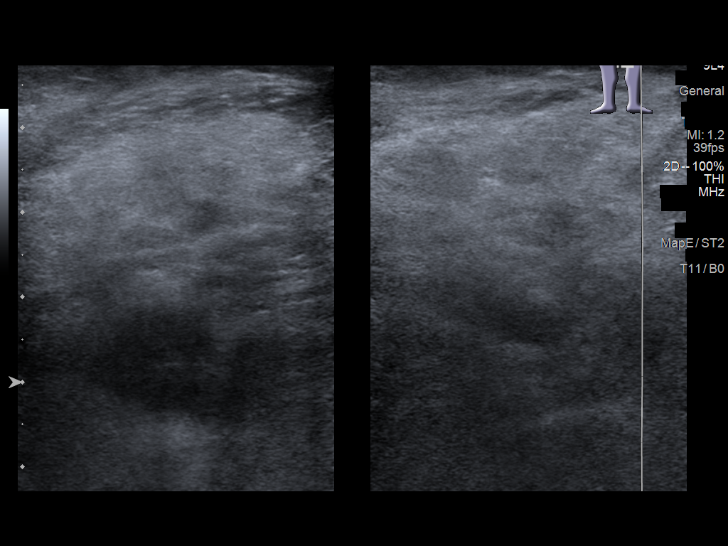
[im 56/56]
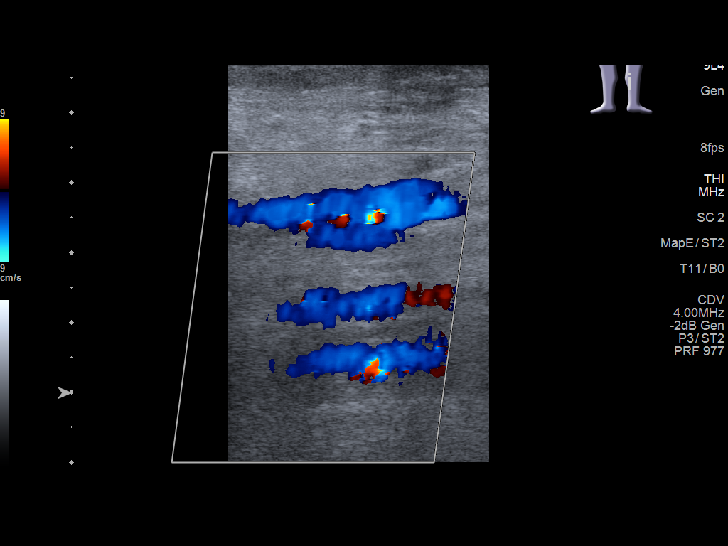

[13 of 24 positions shown; findings below may reference images not displayed]

FINDINGS: RIGHT LOWER EXTREMITY

Common Femoral Vein: No evidence of thrombus. Normal
compressibility, respiratory phasicity and response to augmentation.

Saphenofemoral Junction: No evidence of thrombus. Normal
compressibility and flow on color Doppler imaging.

Profunda Femoral Vein: No evidence of thrombus. Normal
compressibility and flow on color Doppler imaging.

Femoral Vein: No evidence of thrombus. Normal compressibility,
respiratory phasicity and response to augmentation.

Popliteal Vein: No evidence of thrombus. Normal compressibility,
respiratory phasicity and response to augmentation.

Calf Veins: No evidence of thrombus. Normal compressibility and flow
on color Doppler imaging.

Superficial Great Saphenous Vein: No evidence of thrombus. Normal
compressibility.

Venous Reflux:  None.

Other Findings:  None.

LEFT LOWER EXTREMITY

Common Femoral Vein: No evidence of thrombus. Normal
compressibility, respiratory phasicity and response to augmentation.

Saphenofemoral Junction: No evidence of thrombus. Normal
compressibility and flow on color Doppler imaging.

Profunda Femoral Vein: No evidence of thrombus. Normal
compressibility and flow on color Doppler imaging.

Femoral Vein: No evidence of thrombus. Normal compressibility,
respiratory phasicity and response to augmentation.

Popliteal Vein: No evidence of thrombus. Normal compressibility,
respiratory phasicity and response to augmentation.

Calf Veins: No evidence of thrombus. Normal compressibility and flow
on color Doppler imaging.

Superficial Great Saphenous Vein: No evidence of thrombus. Normal
compressibility.

Venous Reflux:  None.

Other Findings:  None.
IMPRESSION: No evidence of deep venous thrombosis within either lower extremity.

## 2021-07-07 DIAGNOSIS — D472 Monoclonal gammopathy: Secondary | ICD-10-CM | POA: Insufficient documentation

## 2021-10-20 ENCOUNTER — Other Ambulatory Visit: Payer: Self-pay

## 2021-10-20 ENCOUNTER — Emergency Department
Admission: EM | Admit: 2021-10-20 | Discharge: 2021-10-20 | Disposition: A | Discharge status: Home / Self Care | Payer: Medicare Other | Attending: Emergency Medicine | Admitting: Emergency Medicine

## 2021-10-20 ENCOUNTER — Encounter: Payer: Self-pay | Admitting: Emergency Medicine

## 2021-10-20 ENCOUNTER — Emergency Department: Payer: Medicare Other

## 2021-10-20 DIAGNOSIS — W01198A Fall on same level from slipping, tripping and stumbling with subsequent striking against other object, initial encounter: Secondary | ICD-10-CM | POA: Insufficient documentation

## 2021-10-20 DIAGNOSIS — S0232XA Fracture of orbital floor, left side, initial encounter for closed fracture: Secondary | ICD-10-CM | POA: Insufficient documentation

## 2021-10-20 DIAGNOSIS — W19XXXA Unspecified fall, initial encounter: Secondary | ICD-10-CM

## 2021-10-20 DIAGNOSIS — S065XAA Traumatic subdural hemorrhage with loss of consciousness status unknown, initial encounter: Secondary | ICD-10-CM

## 2021-10-20 DIAGNOSIS — S0230XA Fracture of orbital floor, unspecified side, initial encounter for closed fracture: Secondary | ICD-10-CM

## 2021-10-20 DIAGNOSIS — S0592XA Unspecified injury of left eye and orbit, initial encounter: Secondary | ICD-10-CM | POA: Diagnosis present

## 2021-10-20 MED ORDER — ERYTHROMYCIN 5 MG/GM OP OINT: 1.0000 | TOPICAL_OINTMENT | Freq: Three times a day (TID) | OPHTHALMIC | 0 refills | Status: AC

## 2021-10-20 MED ORDER — ACETAMINOPHEN 500 MG PO TABS
1000.0000 mg | ORAL_TABLET | Freq: Once | ORAL | Status: AC
  Administered 2021-10-20: 1000 mg via ORAL
  Filled 2021-10-20: qty 2

## 2021-10-20 MED ORDER — TETRACAINE HCL 0.5 % OP SOLN
2.0000 [drp] | Freq: Once | OPHTHALMIC | Status: AC
  Administered 2021-10-20: 2 [drp] via OPHTHALMIC
  Filled 2021-10-20: qty 4

## 2021-10-20 MED ORDER — LIDOCAINE-EPINEPHRINE 2 %-1:100000 IJ SOLN
20.0000 mL | Freq: Once | INTRAMUSCULAR | Status: AC
  Administered 2021-10-20: 20 mL via INTRADERMAL
  Filled 2021-10-20: qty 1

## 2021-10-20 MED ORDER — LIDOCAINE-EPINEPHRINE-TETRACAINE (LET) TOPICAL GEL
3.0000 mL | Freq: Once | TOPICAL | Status: AC
  Administered 2021-10-20: 3 mL via TOPICAL
  Filled 2021-10-20: qty 3

## 2021-10-20 MED ORDER — BACITRACIN 500 UNIT/GM EX OINT: 1.0000 | TOPICAL_OINTMENT | Freq: Two times a day (BID) | CUTANEOUS | 0 refills | Status: DC

## 2021-10-20 MED ORDER — ACETAMINOPHEN 500 MG PO TABS: 1000.0000 mg | ORAL_TABLET | Freq: Four times a day (QID) | ORAL | 0 refills | Status: AC | PRN

## 2021-10-20 NOTE — ED Provider Notes (Signed)
LACERATION REPAIR Performed by: Versie Starks Authorized by: Versie Starks Consent: Verbal consent obtained. Risks and benefits: risks, benefits and alternatives were discussed Consent given by: patient Patient identity confirmed: provided demographic data Prepped and Draped in normal sterile fashion Wound explored  Laceration Location: left side face   Laceration Length: 2cm  No Foreign Bodies seen or palpated  Anesthesia: ltopical LET  Local anesthetic: LET  Anesthetic total: 1 ml  Irrigation method: syringe Amount of cleaning: standard  Skin closure: dermabond  Number of sutures: n/a  Technique: n/a  Patient tolerance: Patient tolerated the procedure well with no immediate complications.   LACERATION REPAIR Performed by: Versie Starks Authorized by: Versie Starks Consent: Verbal consent obtained. Risks and benefits: risks, benefits and alternatives were discussed Consent given by: patient Patient identity confirmed: provided demographic data Prepped and Draped in normal sterile fashion Wound explored  Laceration Location: chin  Laceration Length: 3cm  No Foreign Bodies seen or palpated  Anesthesia: local infiltration  Local anesthetic: lidocaine 2% with epinephrine  Anesthetic total: 1 ml  Irrigation method: syringe Amount of cleaning: standard  Skin closure: 5-0 nylon  Number of sutures: 8  Technique: simple  Patient tolerance: Patient tolerated the procedure well with no immediate complications.    Versie Starks, PA-C 10/20/21 1217    Duffy Bruce, MD 10/25/21 [phone]

## 2021-10-20 NOTE — ED Notes (Signed)
Pt requests external cath. Purwic placed on pt.

## 2021-10-20 NOTE — ED Notes (Signed)
Pt in with lot of dried blood above and behind L ear, little dried blood in nose, at chin and neck and at L eyebrow. Sites cleansed of blood. Small lac noted at lateral L eyebrow and large lac noted at chin. No lac currently noted at L head or nose. Hematoma remains at L/lateral head posterior to ear.

## 2021-10-20 NOTE — ED Notes (Signed)
Spoke with pts guardian Vaughan Basta about pts condition and plan of care. Pts guardian requests this RN update pts home health nurse about his condition after next CT scan. Vaughan Basta states the home health nurse will call this nurse "later."

## 2021-10-20 NOTE — Discharge Instructions (Addendum)
For your eye:  Apply the romycin ointment 2-3x daily for 5 days No nose blowing or heavy lifting Follow-up with [place] in 1 week

## 2021-10-20 NOTE — ED Provider Notes (Signed)
[facility] Provider Note    Event Date/Time   First MD Initiated Contact with Patient 10/20/21 [phone]     (approximate)   History   Fall   HPI  ZORIAN GUNDERMAN is a 61 y.o. male here with fall.  Patient reportedly dropped his glasses this morning.  He leaned forward to get them and fell forward.  He struck his face and left eye.  He has complained of left facial and eye pain since then.  His eyes swollen shut but he states he is able to see out of it.  No blood thinner use that he is aware of.  History swelling noted due to cognitive impairment.  Tech who is with him states he seems to be at his baseline.  He denies any other complaints.  Denies any other pain.     Physical Exam   Triage Vital Signs: ED Triage Vitals  Enc Vitals Group     BP 10/20/21 0950 108/86     Pulse Rate 10/20/21 0947 66     Resp 10/20/21 0947 16     Temp 10/20/21 0947 97.7 F (36.5 C)     Temp src --      SpO2 10/20/21 0947 96 %     Weight 10/20/21 0947 184 lb 8.4 oz (83.7 kg)     Height --      Head Circumference --      Peak Flow --      Pain Score 10/20/21 0947 0     Pain Loc --      Pain Edu? --      Excl. in [place]? --     Most recent vital signs: Vitals:   10/20/21 1300 10/20/21 1400  BP: 125/66 123/72  Pulse: 64 65  Resp: 14 16  Temp:    SpO2: 98% 97%     General: Awake, no distress.  CV:  Good peripheral perfusion.  Resp:  Normal effort.  Lungs clear auscultation bilaterally. Abd:  No distention.  Other:  On HEENT exam, the patient has moderate left periorbital ecchymoses and swelling.  No overt proptosis and globes appear soft.  There is a small, less than 0.5 cm laceration just lateral to the lateral canthus as well as inferior to this.  This does not appear to involve the lateral canthus itself.  On the chin, there is a 2 cm, split, laceration to the lower chin.  No exposed bone.  Bleeding is controlled.  No hemotympanum.   ED Results / Procedures /  Treatments   Labs (all labs ordered are listed, but only abnormal results are displayed) Labs Reviewed - No data to display   EKG    RADIOLOGY CT head: Small volume acute subdural without mass effect, acute fracture of left lamina preceded with possible retrobulbar hemorrhage CT C-spine: Negative CT face: Blowout fracture of the medial wall of the left orbit, no herniation, orbital globe appears symmetrical, mucosal thickening of the left maxillary ethmoid and sphenoid sinuses suggesting incidental sinusitis, possibly blood, subcutaneous hematoma left periorbital region   I also independently reviewed and agree with radiologist interpretations.   PROCEDURES:  Critical Care performed: No   MEDICATIONS ORDERED IN ED: Medications  lidocaine-EPINEPHrine (XYLOCAINE W/EPI) 2 %-1:100000 (with pres) injection 20 mL (20 mLs Intradermal Given by Other 10/20/21 1025)  acetaminophen (TYLENOL) tablet 1,000 mg (1,000 mg Oral Given 10/20/21 1023)  lidocaine-EPINEPHrine-tetracaine (LET) topical gel (3 mLs Topical Given by Other 10/20/21 1140)  tetracaine (PONTOCAINE)  0.5 % ophthalmic solution 2 drop (2 drops Left Eye Given 10/20/21 1130)     IMPRESSION / MDM / ASSESSMENT AND PLAN / ED COURSE  I reviewed the triage vital signs and the nursing notes.                               The patient is on the cardiac monitor to evaluate for evidence of arrhythmia and/or significant heart rate changes.   Ddx:  Differential includes the following, with pertinent life- or limb-threatening emergencies considered:  Trauma, retrobulbar hematoma, subdural hematoma, facial fracture, ocular injury  Patient's presentation is most consistent with acute presentation with potential threat to life or bodily function.  MDM:  61 year old pleasant male here with mechanical fall as he bent down to pick up his glasses.  He is at his mental baseline according to his group home.  On exam, patient has superficial  lacerations lateral to the left eye that does not appear to involve the orbit.  The pupils are reactive bilaterally.  Intraocular pressures 16 on the right and 19 on the left.  There is no significant proptosis.  Globes are soft.  No evidence of rupture.  Midface is stable.  He also has a laceration on the lower chin.  Lacerations repaired by Ashok Cordia, PA.  See her note for previous details.  Intraocular pressures remained stable.  His vision is grossly intact with reactive pupils.  Discussed the case with Dr. Edison Pace of ophthalmology, who can see the patient as an outpatient in a week.  Return with any worsening eye pain.  Discussed with Dr. Cari Caraway regarding his CT, plan to repeat in 6 hours.  If negative, patient can discharge.  He does not need neurosurgery follow-up if stable, can follow-up with PCP.   MEDICATIONS GIVEN IN ED: Medications  lidocaine-EPINEPHrine (XYLOCAINE W/EPI) 2 %-1:100000 (with pres) injection 20 mL (20 mLs Intradermal Given by Other 10/20/21 1025)  acetaminophen (TYLENOL) tablet 1,000 mg (1,000 mg Oral Given 10/20/21 1023)  lidocaine-EPINEPHrine-tetracaine (LET) topical gel (3 mLs Topical Given by Other 10/20/21 1140)  tetracaine (PONTOCAINE) 0.5 % ophthalmic solution 2 drop (2 drops Left Eye Given 10/20/21 1130)     Consults:  Neurosurgery Dr. Izora Ribas Ophthalmology, Dr. Edison Pace   EMR reviewed       FINAL CLINICAL IMPRESSION(S) / ED DIAGNOSES   Final diagnoses:  Fall, initial encounter  Orbital floor (blow-out) closed fracture ([place])  Subdural hematoma ([place])     Rx / DC Orders   ED Discharge Orders          Ordered    acetaminophen (TYLENOL) 500 MG tablet  Every 6 hours PRN        10/20/21 1618    erythromycin ophthalmic ointment  3 times daily        10/20/21 1618    bacitracin 500 UNIT/GM ointment  2 times daily        10/20/21 1618             Note:  This document was prepared using Dragon voice recognition software and may include  unintentional dictation errors.   Duffy Bruce, MD 10/20/21 [phone]

## 2021-10-20 NOTE — ED Triage Notes (Signed)
Pt to ED [facility] from [place] for mechanical fall. Pts glasses fell off and he bent over to get him, when he did, he lost his balance and fell face first. Pts left eye is swollen shut. Pt also has a laceration on the left eye and laceration to the chin. Pts nose is swollen with dried blood in his nose. Pt denies LOC. Pt is cooperative at this time. Pts care giver is with him at bedside.

## 2021-10-21 ENCOUNTER — Telehealth: Payer: Self-pay

## 2021-10-21 NOTE — Telephone Encounter (Signed)
The ER contacted Dr Izora Ribas about this patient's subdural. No follow up needed with our office per Dr Izora Ribas.

## 2022-12-07 ENCOUNTER — Other Ambulatory Visit: Payer: Self-pay

## 2022-12-07 ENCOUNTER — Ambulatory Visit
Admission: EM | Admit: 2022-12-07 | Discharge: 2022-12-07 | Payer: Medicare Other | Attending: Emergency Medicine | Admitting: Emergency Medicine

## 2022-12-07 DIAGNOSIS — W19XXXA Unspecified fall, initial encounter: Secondary | ICD-10-CM

## 2022-12-07 DIAGNOSIS — R42 Dizziness and giddiness: Secondary | ICD-10-CM

## 2022-12-07 DIAGNOSIS — S0990XA Unspecified injury of head, initial encounter: Secondary | ICD-10-CM | POA: Diagnosis not present

## 2022-12-07 NOTE — ED Provider Notes (Signed)
HPI  SUBJECTIVE:  Frank Conley is a 62 y.o. male who presents with disequilibrium and "moving slow" after having an unwitnessed fall in the bathroom 5 days ago.  The caretaker who brings him in states that a staff member heard him stumble and fall, hitting his left head.  She did not get any report of preceding chest pain, shortness of breath or palpitations or syncope causing the fall.  She denies loss of consciousness, postictal confusion.  Patient has been acting normally since.  No vomiting, nausea, slurred speech, arm, leg, facial droop or weakness.  Patient denies headache, photophobia.  No altered mental status per caretaker.  She states that he is eating and drinking well.  No change in his medications recently.  She brings him in today because of his generalized unsteadiness and a stumble with near fall.  He is not falling consistently to 1 side.  No change in his speech.  No aphasia, dysarthria.  Patient has a past medical history of seizures,  last seizure in 2019, sick sinus syndrome status post pacemaker, hypertension, developmental disorder, hyponatremia, multifocal pneumonia, chronic kidney disease stage IIIb.  Taker states that patient had his pacemaker evaluated last month and it was working normally.  No history of CVA, MI.  History primarily obtained from caretaker/staff member at the group home where he lives  Past Medical History:  Diagnosis Date   Depression    Hypertension    MGUS (monoclonal gammopathy of unknown significance)    Pervasive developmental disorder    Seizures (HCC)    SSS (sick sinus syndrome) (HCC)    PPM placed    Past Surgical History:  Procedure Laterality Date   PACEMAKER IMPLANT      Family History  Family history unknown: Yes    Social History   Tobacco Use   Smoking status: Never   Smokeless tobacco: Never  Substance Use Topics   Alcohol use: Never   Drug use: Never    No current facility-administered medications for this  encounter.  Current Outpatient Medications:    amLODipine (NORVASC) 10 MG tablet, Take 10 mg by mouth daily., Disp: , Rfl:    clonazePAM (KLONOPIN) 0.5 MG disintegrating tablet, Take 0.5 mg by mouth 3 (three) times daily., Disp: , Rfl:    cloNIDine (CATAPRES - DOSED IN MG/24 HR) 0.2 mg/24hr patch, Place 0.2 mg onto the skin once a week., Disp: , Rfl:    dexamethasone (DECADRON) 2 MG tablet, Take 2 tablets once daily for 3 days, then 1 tablet once daily for 3 days, then STOP., Disp: 9 tablet, Rfl: 0   ferrous sulfate 325 (65 FE) MG tablet, Take 325 mg by mouth daily., Disp: , Rfl:    guaiFENesin (MUCINEX) 600 MG 12 hr tablet, Take 1 tablet (600 mg total) by mouth 2 (two) times daily., Disp: 10 tablet, Rfl: 0   lamoTRIgine (LAMICTAL) 100 MG tablet, Take 100 mg by mouth 2 (two) times daily., Disp: , Rfl:    lamoTRIgine (LAMICTAL) 25 MG tablet, Take 75 mg by mouth at bedtime. Take in addition to 100 mg tablet for a total dose of 175 mg, Disp: , Rfl:    loratadine (CLARITIN) 10 MG tablet, Take 10 mg by mouth daily., Disp: , Rfl:    magnesium oxide (MAG-OX) 400 MG tablet, Take 400 mg by mouth daily., Disp: , Rfl:    Melatonin 3 MG TABS, Take 3 mg by mouth every evening., Disp: , Rfl:    Multiple Vitamins-Minerals (PRESERVISION  AREDS 2) CAPS, Take 1 capsule by mouth 2 (two) times daily., Disp: , Rfl:    Omega-3 Fatty Acids (FISH OIL) 1000 MG CAPS, Take 2,000 mg by mouth daily., Disp: , Rfl:    pantoprazole (PROTONIX) 40 MG tablet, Take 1 tablet (40 mg total) by mouth daily for 14 days., Disp: 14 tablet, Rfl: 0   polyethylene glycol (MIRALAX / GLYCOLAX) 17 g packet, Take 17 g by mouth 2 (two) times daily., Disp: , Rfl:    QUEtiapine (SEROQUEL) 100 MG tablet, Take 150 mg by mouth 3 (three) times daily., Disp: , Rfl:    QUEtiapine (SEROQUEL) 25 MG tablet, Take 25 mg by mouth daily as needed for agitation., Disp: , Rfl:    senna (SENOKOT) 8.6 MG TABS tablet, Take 2 tablets by mouth at bedtime as needed for  mild constipation., Disp: , Rfl:    bacitracin 500 UNIT/GM ointment, Apply 1 Application topically 2 (two) times daily. To chin laceration until healed, Disp: 15 g, Rfl: 0  Allergies  Allergen Reactions   Dilantin [Phenytoin]    Keppra [Levetiracetam]    Lisinopril      ROS  As noted in HPI.   Physical Exam  BP 110/83   Pulse 60   Temp (!) 97.2 F (36.2 C)   Resp 18   SpO2 100%   Constitutional: Well developed, well nourished, no acute distress Eyes:  EOMI, conjunctiva normal bilaterally PERRLA, no direct or consensual photophobia HENT: Normocephalic, membranes moist.  Large bruise over left temple.  No periorbital ecchymosis.  Respiratory: Normal inspiratory effort, lungs clear bilaterally Cardiovascular: Normal rate, regular rhythm, no murmurs rubs or gallops GI: nondistended skin: No rash, skin intact Musculoskeletal: no deformities Neurologic: Speech fluent.  Sometimes answers questions appropriately. GCS 15, cranial nerves II through XII intact.  Finger-to-nose, heel shin equal bilaterally.  Romberg negative.  Patient unable to perform tandem gait.  Shuffling gait.  Caregiver states this is a little worse than usual. Psychiatric: Speech and behavior appropriate   ED Course   Medications - No data to display  No orders of the defined types were placed in this encounter.   No results found for this or any previous visit (from the past 24 hour(s)). No results found.  ED Clinical Impression  1. Closed head injury, initial encounter   2. Disequilibrium   3. Fall, initial encounter      ED Assessment/Plan     Additional medical history obtained from record review.  Patient presents status post unwitnessed fall 5 days ago with resulting sluggishness and disequilibrium.  It is certain as to whether he had a mechanical fall.  The staff member who brings him states that she had no report of syncope, seizure, preceding chest pain, palpitations or shortness of  breath before his fall 5 days ago.  Transferring to the emergency department for comprehensive evaluation.  Differential includes a concussion, subdural hematoma, electrolyte imbalance, acute kidney injury, pacemaker dysfunction, although this is less likely given his normal recent pacemaker check.  Given the patient has no focal neurologic deficits, and the fall happened 5 days ago, I feel that he is stable to go via private vehicle to the emergency department.  Discussed rationale for transfer to the ED with patient and caregiver.  They agree to go.   No orders of the defined types were placed in this encounter.     *This clinic note was created using Dragon dictation software. Therefore, there may be occasional mistakes despite careful proofreading.  ?  Domenick Gong, MD 12/07/22 1054

## 2022-12-07 NOTE — ED Triage Notes (Signed)
Pt is in a group home and Pt fell on Saturday and struck left side of forehead on bathroom counter no LOC. Over the course of the last few days pt experiencing and unsteady gait and some confusion.

## 2022-12-07 NOTE — Discharge Instructions (Signed)
Please go to the emergency department now for comprehensive evaluation.  Let them know if his speech, coordination, or mental status changes.

## 2022-12-16 ENCOUNTER — Other Ambulatory Visit: Payer: Self-pay

## 2022-12-16 ENCOUNTER — Encounter: Payer: Self-pay | Admitting: Intensive Care

## 2022-12-16 ENCOUNTER — Encounter (HOSPITAL_COMMUNITY): Payer: Self-pay

## 2022-12-16 ENCOUNTER — Emergency Department
Admission: EM | Admit: 2022-12-16 | Discharge: 2022-12-16 | Disposition: A | Discharge status: Other Acute Inpatient Hospital | Payer: Medicare Other | Attending: Emergency Medicine | Admitting: Emergency Medicine

## 2022-12-16 ENCOUNTER — Observation Stay (HOSPITAL_COMMUNITY)
Admission: EM | Admit: 2022-12-16 | Discharge: 2022-12-18 | Disposition: A | Discharge status: Other Acute Inpatient Hospital | Payer: Medicare Other | Attending: Family Medicine | Admitting: Family Medicine

## 2022-12-16 ENCOUNTER — Emergency Department: Payer: Medicare Other

## 2022-12-16 ENCOUNTER — Emergency Department (HOSPITAL_COMMUNITY): Payer: Medicare Other

## 2022-12-16 DIAGNOSIS — Z79899 Other long term (current) drug therapy: Secondary | ICD-10-CM | POA: Diagnosis not present

## 2022-12-16 DIAGNOSIS — I639 Cerebral infarction, unspecified: Secondary | ICD-10-CM | POA: Insufficient documentation

## 2022-12-16 DIAGNOSIS — W19XXXA Unspecified fall, initial encounter: Secondary | ICD-10-CM | POA: Insufficient documentation

## 2022-12-16 DIAGNOSIS — R2681 Unsteadiness on feet: Principal | ICD-10-CM | POA: Insufficient documentation

## 2022-12-16 DIAGNOSIS — Z950 Presence of cardiac pacemaker: Secondary | ICD-10-CM | POA: Diagnosis not present

## 2022-12-16 DIAGNOSIS — I6509 Occlusion and stenosis of unspecified vertebral artery: Secondary | ICD-10-CM | POA: Insufficient documentation

## 2022-12-16 DIAGNOSIS — R531 Weakness: Secondary | ICD-10-CM | POA: Diagnosis present

## 2022-12-16 DIAGNOSIS — I10 Essential (primary) hypertension: Secondary | ICD-10-CM | POA: Diagnosis present

## 2022-12-16 DIAGNOSIS — S0083XA Contusion of other part of head, initial encounter: Secondary | ICD-10-CM | POA: Insufficient documentation

## 2022-12-16 DIAGNOSIS — R569 Unspecified convulsions: Secondary | ICD-10-CM | POA: Insufficient documentation

## 2022-12-16 DIAGNOSIS — I6502 Occlusion and stenosis of left vertebral artery: Secondary | ICD-10-CM

## 2022-12-16 DIAGNOSIS — I7774 Dissection of vertebral artery: Secondary | ICD-10-CM | POA: Diagnosis not present

## 2022-12-16 DIAGNOSIS — R6250 Unspecified lack of expected normal physiological development in childhood: Secondary | ICD-10-CM | POA: Diagnosis present

## 2022-12-16 DIAGNOSIS — R55 Syncope and collapse: Secondary | ICD-10-CM

## 2022-12-16 DIAGNOSIS — G40909 Epilepsy, unspecified, not intractable, without status epilepticus: Secondary | ICD-10-CM

## 2022-12-16 LAB — CBC WITH DIFFERENTIAL/PLATELET
Abs Immature Granulocytes: 0.01 10*3/uL (ref 0.00–0.07)
Basophils Absolute: 0 10*3/uL (ref 0.0–0.1)
Basophils Relative: 1 %
Eosinophils Absolute: 0 10*3/uL (ref 0.0–0.5)
Eosinophils Relative: 1 %
HCT: 32.8 % — ABNORMAL LOW (ref 39.0–52.0)
Hemoglobin: 11.1 g/dL — ABNORMAL LOW (ref 13.0–17.0)
Immature Granulocytes: 0 %
Lymphocytes Relative: 11 %
Lymphs Abs: 0.6 10*3/uL — ABNORMAL LOW (ref 0.7–4.0)
MCH: 33.3 pg (ref 26.0–34.0)
MCHC: 33.8 g/dL (ref 30.0–36.0)
MCV: 98.5 fL (ref 80.0–100.0)
Monocytes Absolute: 0.3 10*3/uL (ref 0.1–1.0)
Monocytes Relative: 6 %
Neutro Abs: 4.5 10*3/uL (ref 1.7–7.7)
Neutrophils Relative %: 81 %
Platelets: 189 10*3/uL (ref 150–400)
RBC: 3.33 MIL/uL — ABNORMAL LOW (ref 4.22–5.81)
RDW: 14.7 % (ref 11.5–15.5)
WBC: 5.6 10*3/uL (ref 4.0–10.5)
nRBC: 0 % (ref 0.0–0.2)

## 2022-12-16 LAB — COMPREHENSIVE METABOLIC PANEL
ALT: 12 U/L (ref 0–44)
AST: 22 U/L (ref 15–41)
Albumin: 3.9 g/dL (ref 3.5–5.0)
Alkaline Phosphatase: 99 U/L (ref 38–126)
Anion gap: 10 (ref 5–15)
BUN: 24 mg/dL — ABNORMAL HIGH (ref 6–20)
CO2: 26 mmol/L (ref 22–32)
Calcium: 9 mg/dL (ref 8.9–10.3)
Chloride: 104 mmol/L (ref 98–111)
Creatinine, Ser: 1.65 mg/dL — ABNORMAL HIGH (ref 0.61–1.24)
GFR, Estimated: 47 mL/min — ABNORMAL LOW (ref >60)
Glucose, Bld: 117 mg/dL — ABNORMAL HIGH (ref 70–99)
Potassium: 4.3 mmol/L (ref 3.5–5.1)
Sodium: 140 mmol/L (ref 135–145)
Total Bilirubin: 0.4 mg/dL (ref <1.2)
Total Protein: 7.2 g/dL (ref 6.5–8.1)

## 2022-12-16 LAB — MAGNESIUM: Magnesium: 2.2 mg/dL (ref 1.7–2.4)

## 2022-12-16 LAB — TROPONIN I (HIGH SENSITIVITY): Troponin I (High Sensitivity): <2 ng/L (ref <18)

## 2022-12-16 MED ORDER — LIDOCAINE-EPINEPHRINE-TETRACAINE (LET) TOPICAL GEL: 3.0000 mL | Freq: Once | TOPICAL | Status: DC

## 2022-12-16 MED ORDER — IOHEXOL 350 MG/ML SOLN
75.0000 mL | Freq: Once | INTRAVENOUS | Status: AC | PRN
  Administered 2022-12-16: 75 mL via INTRAVENOUS

## 2022-12-16 NOTE — ED Notes (Signed)
  Brother reports pacemaker is [employer]  Internal leads Model: 7740 [id number]  Second leads model:7741 [id number]  Placed 11/12/2014

## 2022-12-16 NOTE — ED Triage Notes (Signed)
Pt in via EMS from a group home on [address] in [place] with c/o   Per EMS one of the staff members reports he was eating breakfast and slumped over. Pt with hx of seizures but has not had one in years. EMS reports pt was alert and not postictal when they arrived. Pt with wound on right lower leg that has not been treated. #20g to right forearm. CBG 159, 97.5 temp, HR 60, 98% RA

## 2022-12-16 NOTE — ED Notes (Signed)
Pacemaker interrogation per MD order completed

## 2022-12-16 NOTE — ED Provider Notes (Addendum)
Patient initially coming from [facility] for MRI to rule out stroke.  However patient has a pacemaker and they do not have pacemaker capabilities for their MRI at [facility].  Patient has had worsening symptoms over the last 2 weeks with more frequent falls and more falling to the left.  His brother is present who is his guardian and reports that he has had issues with falling to the left for years but is much more pronounced in the last 2 weeks.  He is also complained of feeling dizzy.  Today he had a syncopal event which is the first time that has happened.  He was not out long and then symptoms resolved.  He denies any chest pain or shortness of breath.  Had a workup at [facility] with negative labs and negative head CT.  Patient has no complaints at this time.  7:52 PM Spoke with Dr. Randal Buba with neurology who recommended a CTA to ensure no vessel abnormality.  However patient cannot get an MRI this weekend.  He felt if CTA was normal patient was cleared for discharge and outpatient follow-up with his neurologist Dr. Lisabeth Register to have an outpatient MRI.  This was discussed with his brother who is comfortable with this plan.  Also we will interrogate his pacemaker to ensure no dysrhythmia today.  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  Could be related to ruptured plaque or possible dissection.  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  Findings discussed with neurology Dr.Khalqdina and he reports he would need to come evaluate the patient. Interrogation showed no events today during patient's event at 8 AM this morning.   Gwyneth Sprout,  MD 12/16/22 8657    Gwyneth Sprout, MD 12/16/22 2357

## 2022-12-16 NOTE — ED Triage Notes (Signed)
PT lives at a group home.  He was eating breakfast this am and just dropped to the floor, though did not become unconscious.  Pt has been extensively worked up at [facility], but was unable to completed an MRI d/t his pacemaker.  Thus, pt was sent here for an MRI.   Per brother, pt mentation and balance is improving from several weeks ago.

## 2022-12-16 NOTE — ED Provider Notes (Signed)
[facility] Provider Note    Event Date/Time   First MD Initiated Contact with Patient 12/16/22 1155     (approximate)   History   Seizures   HPI  Frank Conley is a 62 y.o. male past medical history significant for mental disability who lives at a group home and has a guardian of his older brother Maurine Minister, history of hypertension, seizure disorder, sick sinus syndrome with [facility] pacemaker in place, who presents to the emergency department following a fall.  History provided by the patient's brother at bedside.  States today he called out after he had an episode of syncope.  Believes that he passed out well sitting there at his group home eating.  Denies any preceding symptoms of chest pain or shortness of breath.  No noted seizure-like activity.  States he is back to his mental status baseline at this time.  Patient without complaints at this time.  States that he has been seen a couple of times in the past couple of weeks secondary to falls and head injury.  States that he has been having trouble with his gait and feeling off balance and having trouble with disequilibrium.  Denies any room spinning dizziness.  Denies any change in vision.  No prior history of a CVA.  Not on anticoagulation.    Recently was evaluated by cardiology to figure out if he had an etiology that was cardiac for his symptoms of falls and gait instability.  His pacemaker has been interrogated.  He had a recent echocardiogram that did not show any acute findings.   Physical Exam   Triage Vital Signs: ED Triage Vitals  Encounter Vitals Group     BP 12/16/22 1014 120/64     Systolic BP Percentile --      Diastolic BP Percentile --      Pulse Rate 12/16/22 1014 (!) 59     Resp 12/16/22 1014 16     Temp 12/16/22 1018 97.7 F (36.5 C)     Temp src --      SpO2 12/16/22 1014 99 %     Weight 12/16/22 1018 182 lb 15.7 oz (83 kg)     Height --      Head Circumference --      Peak  Flow --      Pain Score 12/16/22 1016 0     Pain Loc --      Pain Education --      Exclude from Growth Chart --     Most recent vital signs: Vitals:   12/16/22 1430 12/16/22 1505  BP: 133/81 (!) 140/79  Pulse: (!) 59 63  Resp: 15 16  Temp:  97.9 F (36.6 C)  SpO2: 97% 98%    Physical Exam Constitutional:      Appearance: He is well-developed.  HENT:     Head:     Comments: Ecchymosis to the left forehead Eyes:     Extraocular Movements: Extraocular movements intact.     Conjunctiva/sclera: Conjunctivae normal.     Pupils: Pupils are equal, round, and reactive to light.  Cardiovascular:     Rate and Rhythm: Regular rhythm.     Heart sounds: No murmur heard. Pulmonary:     Effort: No respiratory distress.  Abdominal:     Tenderness: There is no abdominal tenderness.  Musculoskeletal:     Cervical back: Normal range of motion.  Skin:    General: Skin is warm.  Neurological:  Mental Status: He is alert. Mental status is at baseline.     GCS: GCS eye subscore is 4. GCS verbal subscore is 5. GCS motor subscore is 6.     Cranial Nerves: Cranial nerves 2-12 are intact.     Sensory: Sensation is intact.     Motor: Motor function is intact.     Coordination: Coordination is intact.     Gait: Gait abnormal.     Comments: Abnormal gait, falling to the left side     IMPRESSION / MDM / ASSESSMENT AND PLAN / ED COURSE  I reviewed the triage vital signs and the nursing notes.  Differential diagnosis including seizure, intracranial hemorrhage, postconcussive syndrome, electrolyte abnormality, dehydration, dysrhythmia, ACS, CVA  EKG  I, Corena Herter, the attending physician, personally viewed and interpreted this ECG.   Rate: Normal  Rhythm: Normal sinus  Intervals: Normal  ST&T Change: None Artifact.  Paced rhythm.    No tachycardic or bradycardic dysrhythmias while on cardiac telemetry.  RADIOLOGY I independently reviewed imaging, my interpretation of  imaging: CT scan of the head without signs of intracranial hemorrhage.  Read as no acute findings  LABS (all labs ordered are listed, but only abnormal results are displayed) Labs interpreted as -    Labs Reviewed  COMPREHENSIVE METABOLIC PANEL - Abnormal; Notable for the following components:      Result Value   Glucose, Bld 117 (*)    BUN 24 (*)    Creatinine, Ser 1.65 (*)    GFR, Estimated 47 (*)    All other components within normal limits  CBC WITH DIFFERENTIAL/PLATELET - Abnormal; Notable for the following components:   RBC 3.33 (*)    Hemoglobin 11.1 (*)    HCT 32.8 (*)    Lymphs Abs 0.6 (*)    All other components within normal limits  MAGNESIUM  URINALYSIS, W/ REFLEX TO CULTURE (INFECTION SUSPECTED)  TROPONIN I (HIGH SENSITIVITY)     MDM  Patient's troponin is undetectable have low suspicion for ACS.  No tachycardic or bradycardic dysrhythmias while on cardiac telemetry.    On chart review recent echocardiogram without signs of aortic stenosis.  No signs of heart failure.   No significant leukocytosis or anemia.  Creatinine is elevated at 1.65 however when compared to outside labs appears to have a baseline of around 1.4   Given significant gait instability went to evaluate for possible CVA.  Patient is unable to get an MRI at this facility given his pacemaker.  Called and discussed with [facility] who has MRI pacemaker capabilities.  Discussed with the brother who stated that he would like to go get an MRI at [facility].  Felt like if it was negative for acute stroke that he wanted to continue to workup as an outpatient with his primary care physician.  States that he could take him back to the group home.  PROCEDURES:  Critical Care performed: No  Procedures  Patient's presentation is most consistent with acute presentation with potential threat to life or bodily function.   MEDICATIONS ORDERED IN ED: Medications - No data to display  FINAL CLINICAL IMPRESSION(S) / ED  DIAGNOSES   Final diagnoses:  Syncope, unspecified syncope type  Gait instability     Rx / DC Orders   ED Discharge Orders     None        Note:  This document was prepared using Dragon voice recognition software and may include unintentional dictation errors.   Corena Herter, MD  12/16/22 1523  

## 2022-12-17 ENCOUNTER — Encounter (HOSPITAL_COMMUNITY): Payer: Self-pay | Admitting: Internal Medicine

## 2022-12-17 ENCOUNTER — Inpatient Hospital Stay (HOSPITAL_COMMUNITY): Payer: Medicare Other

## 2022-12-17 DIAGNOSIS — I10 Essential (primary) hypertension: Secondary | ICD-10-CM | POA: Diagnosis not present

## 2022-12-17 DIAGNOSIS — R6250 Unspecified lack of expected normal physiological development in childhood: Secondary | ICD-10-CM

## 2022-12-17 DIAGNOSIS — I639 Cerebral infarction, unspecified: Secondary | ICD-10-CM | POA: Diagnosis present

## 2022-12-17 DIAGNOSIS — R2681 Unsteadiness on feet: Secondary | ICD-10-CM | POA: Diagnosis not present

## 2022-12-17 DIAGNOSIS — G40909 Epilepsy, unspecified, not intractable, without status epilepticus: Secondary | ICD-10-CM

## 2022-12-17 DIAGNOSIS — R531 Weakness: Secondary | ICD-10-CM

## 2022-12-17 LAB — ECHOCARDIOGRAM COMPLETE
AR max vel: 2.58 cm2
AV Area VTI: 2.83 cm2
AV Area mean vel: 2.69 cm2
AV Mean grad: 3 mm[Hg]
AV Peak grad: 5.4 mm[Hg]
Ao pk vel: 1.16 m/s
Area-P 1/2: 2.06 cm2
Height: 71 in
S' Lateral: 2.6 cm
Weight: 2927.71 [oz_av]

## 2022-12-17 LAB — LIPID PANEL
Cholesterol: 191 mg/dL (ref 0–200)
HDL: 65 mg/dL (ref >40)
LDL Cholesterol: 118 mg/dL — ABNORMAL HIGH (ref 0–99)
Total CHOL/HDL Ratio: 2.9 {ratio}
Triglycerides: 40 mg/dL (ref <150)
VLDL: 8 mg/dL (ref 0–40)

## 2022-12-17 LAB — HEMOGLOBIN A1C
Hgb A1c MFr Bld: 4.9 % (ref 4.8–5.6)
Mean Plasma Glucose: 93.93 mg/dL

## 2022-12-17 LAB — HIV ANTIBODY (ROUTINE TESTING W REFLEX): HIV Screen 4th Generation wRfx: NONREACTIVE

## 2022-12-17 MED ORDER — CLOPIDOGREL BISULFATE 75 MG PO TABS
75.0000 mg | ORAL_TABLET | Freq: Every day | ORAL | Status: DC
  Administered 2022-12-17 – 2022-12-18 (×2): 75 mg via ORAL
  Filled 2022-12-17 (×2): qty 1

## 2022-12-17 MED ORDER — FERROUS SULFATE 325 (65 FE) MG PO TABS
325.0000 mg | ORAL_TABLET | Freq: Every day | ORAL | Status: DC
Start: 2022-12-17 — End: 2022-12-18
  Administered 2022-12-17 – 2022-12-18 (×2): 325 mg via ORAL
  Filled 2022-12-17 (×2): qty 1

## 2022-12-17 MED ORDER — LAMOTRIGINE 25 MG PO TABS
150.0000 mg | ORAL_TABLET | Freq: Every day | ORAL | Status: DC
  Administered 2022-12-17 – 2022-12-18 (×2): 150 mg via ORAL
  Filled 2022-12-17: qty 2
  Filled 2022-12-17: qty 6
  Filled 2022-12-17: qty 2

## 2022-12-17 MED ORDER — ACETAMINOPHEN 160 MG/5ML PO SOLN: 650.0000 mg | ORAL | Status: DC | PRN

## 2022-12-17 MED ORDER — PRESERVISION AREDS 2 PO CAPS: 1.0000 | ORAL_CAPSULE | Freq: Two times a day (BID) | ORAL | Status: DC

## 2022-12-17 MED ORDER — QUETIAPINE FUMARATE 25 MG PO TABS: 25.0000 mg | ORAL_TABLET | Freq: Every day | ORAL | Status: DC | PRN

## 2022-12-17 MED ORDER — CLONAZEPAM 0.5 MG PO TBDP
0.5000 mg | ORAL_TABLET | Freq: Three times a day (TID) | ORAL | Status: DC
  Administered 2022-12-17 – 2022-12-18 (×5): 0.5 mg via ORAL
  Filled 2022-12-17 (×3): qty 1
  Filled 2022-12-17: qty 4
  Filled 2022-12-17: qty 1

## 2022-12-17 MED ORDER — LAMOTRIGINE 25 MG PO TABS: 50.0000 mg | ORAL_TABLET | ORAL | Status: DC

## 2022-12-17 MED ORDER — OMEGA-3-ACID ETHYL ESTERS 1 G PO CAPS
2000.0000 mg | ORAL_CAPSULE | Freq: Every day | ORAL | Status: DC
  Administered 2022-12-17 – 2022-12-18 (×2): 2000 mg via ORAL
  Filled 2022-12-17 (×3): qty 2

## 2022-12-17 MED ORDER — ROSUVASTATIN CALCIUM 20 MG PO TABS
20.0000 mg | ORAL_TABLET | Freq: Every day | ORAL | Status: DC
  Administered 2022-12-17 – 2022-12-18 (×2): 20 mg via ORAL
  Filled 2022-12-17 (×2): qty 1

## 2022-12-17 MED ORDER — QUETIAPINE FUMARATE 50 MG PO TABS
150.0000 mg | ORAL_TABLET | Freq: Every day | ORAL | Status: DC
  Administered 2022-12-17: 150 mg via ORAL
  Filled 2022-12-17: qty 1

## 2022-12-17 MED ORDER — AMLODIPINE BESYLATE 5 MG PO TABS: 10.0000 mg | ORAL_TABLET | Freq: Every day | ORAL | Status: DC

## 2022-12-17 MED ORDER — ACETAMINOPHEN 325 MG PO TABS: 650.0000 mg | ORAL_TABLET | ORAL | Status: DC | PRN

## 2022-12-17 MED ORDER — ENOXAPARIN SODIUM 40 MG/0.4ML IJ SOSY
40.0000 mg | PREFILLED_SYRINGE | INTRAMUSCULAR | Status: DC
  Administered 2022-12-17 – 2022-12-18 (×2): 40 mg via SUBCUTANEOUS
  Filled 2022-12-17 (×2): qty 0.4

## 2022-12-17 MED ORDER — MELATONIN 3 MG PO TABS
3.0000 mg | ORAL_TABLET | Freq: Every evening | ORAL | Status: DC
  Administered 2022-12-17: 3 mg via ORAL
  Filled 2022-12-17 (×2): qty 1

## 2022-12-17 MED ORDER — LAMOTRIGINE 25 MG PO TABS: 100.0000 mg | ORAL_TABLET | Freq: Two times a day (BID) | ORAL | Status: DC

## 2022-12-17 MED ORDER — ACETAMINOPHEN 650 MG RE SUPP: 650.0000 mg | RECTAL | Status: DC | PRN

## 2022-12-17 MED ORDER — POLYETHYLENE GLYCOL 3350 17 G PO PACK
17.0000 g | PACK | Freq: Three times a day (TID) | ORAL | Status: DC
  Administered 2022-12-17 (×3): 17 g via ORAL
  Filled 2022-12-17 (×3): qty 1

## 2022-12-17 MED ORDER — LAMOTRIGINE 25 MG PO TABS
175.0000 mg | ORAL_TABLET | Freq: Every day | ORAL | Status: DC
  Administered 2022-12-17: 175 mg via ORAL
  Filled 2022-12-17: qty 3

## 2022-12-17 MED ORDER — OCUVITE-LUTEIN PO CAPS
1.0000 | ORAL_CAPSULE | Freq: Every day | ORAL | Status: DC
  Administered 2022-12-17: 1 via ORAL
  Filled 2022-12-17 (×2): qty 1

## 2022-12-17 MED ORDER — CLONIDINE HCL 0.2 MG/24HR TD PTWK: 0.2000 mg | MEDICATED_PATCH | TRANSDERMAL | Status: DC

## 2022-12-17 MED ORDER — ASPIRIN 81 MG PO TBEC
81.0000 mg | DELAYED_RELEASE_TABLET | Freq: Every day | ORAL | Status: DC
  Administered 2022-12-17 – 2022-12-18 (×2): 81 mg via ORAL
  Filled 2022-12-17 (×2): qty 1

## 2022-12-17 MED ORDER — QUETIAPINE FUMARATE 100 MG PO TABS
100.0000 mg | ORAL_TABLET | Freq: Two times a day (BID) | ORAL | Status: DC
  Administered 2022-12-17 – 2022-12-18 (×4): 100 mg via ORAL
  Filled 2022-12-17 (×4): qty 1

## 2022-12-17 MED ORDER — SENNA 8.6 MG PO TABS
2.0000 | ORAL_TABLET | Freq: Every day | ORAL | Status: DC
  Administered 2022-12-17: 17.2 mg via ORAL
  Filled 2022-12-17: qty 2

## 2022-12-17 MED ORDER — LORATADINE 10 MG PO TABS
10.0000 mg | ORAL_TABLET | Freq: Every day | ORAL | Status: DC
  Administered 2022-12-17 – 2022-12-18 (×2): 10 mg via ORAL
  Filled 2022-12-17 (×2): qty 1

## 2022-12-17 MED ORDER — STROKE: EARLY STAGES OF RECOVERY BOOK
Freq: Once | Status: AC
  Filled 2022-12-17: qty 1

## 2022-12-17 NOTE — Progress Notes (Signed)
Pt arrived on unit. Got pt settled in room and acclimated with unit. Bed is locked, at the lowest position and call bell within reach. Dinner tray arranged in front of pt.

## 2022-12-17 NOTE — Consult Note (Signed)
NEUROLOGY CONSULT NOTE   Date of service: December 17, 2022 [name] MRN:  [id number] DOB:  [birth year] Chief Complaint: "dizzy and falling to left" Requesting Provider: Glynn Octave, MD  History of Present Illness  Frank Conley is a 62 y.o. male with hx of developmental delay, SSS, MGUS, seizures, HTN who for years has had hx of falls and slight lean to the left. Does have periods where he is better and periods where he is worse but over the last 2 weeks, been consistently leaning to the left while walking or stand up, even on flat and even surface and been swaying a lot more and been having multiple falls. Brother brought him in to get checked out. Initially presented to [facility] ED where CT Head w/o contrast was negative for any acute intracranial abnormalities.  He was transferred to [facility] to get MRI brain w/o contrast but with his pacemaker, unable to get it over the weekend.  Neurology consulted to evaluate him for further workup.  He had CT Angio head and neck which demonstrated atheromatous change about the proximal left V1 segment with associated intimal irregularity and small amount of intraluminal adherent thrombus. Findings could be related to a recently ruptured plaque or possibly short segment dissection. 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   ROS  Unable to ascertain due to baseline developmental delay.  Past History   Past Medical History:  Diagnosis Date   Depression    Hypertension    MGUS (monoclonal gammopathy of unknown significance)    Pervasive developmental disorder    Seizures (HCC)    SSS (sick sinus syndrome) (HCC)    PPM placed    Past Surgical History:  Procedure Laterality Date   PACEMAKER IMPLANT      Family History: Family History  Family history unknown: Yes    Social History  reports that he has never smoked. He has never used smokeless tobacco. He reports that he does not drink alcohol and does not use  drugs.  Allergies  Allergen Reactions   Dilantin [Phenytoin]    Keppra [Levetiracetam]    Lisinopril     Medications  No current facility-administered medications for this encounter.  Current Outpatient Medications:    amLODipine (NORVASC) 10 MG tablet, Take 10 mg by mouth daily., Disp: , Rfl:    bacitracin 500 UNIT/GM ointment, Apply 1 Application topically 2 (two) times daily. To chin laceration until healed, Disp: 15 g, Rfl: 0   clonazePAM (KLONOPIN) 0.5 MG disintegrating tablet, Take 0.5 mg by mouth 3 (three) times daily., Disp: , Rfl:    cloNIDine (CATAPRES - DOSED IN MG/24 HR) 0.2 mg/24hr patch, Place 0.2 mg onto the skin once a week., Disp: , Rfl:    dexamethasone (DECADRON) 2 MG tablet, Take 2 tablets once daily for 3 days, then 1 tablet once daily for 3 days, then STOP., Disp: 9 tablet, Rfl: 0   ferrous sulfate 325 (65 FE) MG tablet, Take 325 mg by mouth daily., Disp: , Rfl:    guaiFENesin (MUCINEX) 600 MG 12 hr tablet, Take 1 tablet (600 mg total) by mouth 2 (two) times daily., Disp: 10 tablet, Rfl: 0   lamoTRIgine (LAMICTAL) 100 MG tablet, Take 100 mg by mouth 2 (two) times daily., Disp: , Rfl:    lamoTRIgine (LAMICTAL) 25 MG tablet, Take 75 mg by mouth at bedtime. Take in addition to 100 mg tablet for a total dose of 175 mg, Disp: , Rfl:  loratadine (CLARITIN) 10 MG tablet, Take 10 mg by mouth daily., Disp: , Rfl:    magnesium oxide (MAG-OX) 400 MG tablet, Take 400 mg by mouth daily., Disp: , Rfl:    Melatonin 3 MG TABS, Take 3 mg by mouth every evening., Disp: , Rfl:    Multiple Vitamins-Minerals (PRESERVISION AREDS 2) CAPS, Take 1 capsule by mouth 2 (two) times daily., Disp: , Rfl:    Omega-3 Fatty Acids (FISH OIL) 1000 MG CAPS, Take 2,000 mg by mouth daily., Disp: , Rfl:    pantoprazole (PROTONIX) 40 MG tablet, Take 1 tablet (40 mg total) by mouth daily for 14 days., Disp: 14 tablet, Rfl: 0   polyethylene glycol (MIRALAX / GLYCOLAX) 17 g packet, Take 17 g by mouth 2 (two)  times daily., Disp: , Rfl:    QUEtiapine (SEROQUEL) 100 MG tablet, Take 150 mg by mouth 3 (three) times daily., Disp: , Rfl:    QUEtiapine (SEROQUEL) 25 MG tablet, Take 25 mg by mouth daily as needed for agitation., Disp: , Rfl:    senna (SENOKOT) 8.6 MG TABS tablet, Take 2 tablets by mouth at bedtime as needed for mild constipation., Disp: , Rfl:   Vitals   Vitals:   12/16/22 2030 12/16/22 2045 12/16/22 2100 12/16/22 2235  BP: 127/69 123/70 126/74 119/72  Pulse: 62 63 (!) 59 66  Resp:    15  Temp:    98.2 F (36.8 C)  TempSrc:    Oral  SpO2: 100% 100% 100% 100%  Weight:      Height:        Body mass index is 25.52 kg/m.  Physical Exam   General: Laying comfortably in bed; in no acute distress.  HENT: Normal oropharynx and mucosa. Normal external appearance of ears and nose.  Neck: Supple, no pain or tenderness  CV: No JVD. No peripheral edema.  Pulmonary: Symmetric Chest rise. Normal respiratory effort.  Abdomen: Soft to touch, non-tender.  Ext: No cyanosis, edema, or deformity  Skin: No rash. Normal palpation of skin.   Musculoskeletal: Normal digits and nails by inspection. No clubbing.   Neurologic Examination  Mental status/Cognition: Alert, oriented to self, place, month and year, good attention.  Speech/language: Fluent, comprehension intact, object naming intact, repetition intact.  Cranial nerves:   CN II Pupils equal and reactive to light, no VF deficits    CN III,IV,VI EOM intact, no gaze preference or deviation, no nystagmus    CN V normal sensation in V1, V2, and V3 segments bilaterally    CN VII no asymmetry, no nasolabial fold flattening    CN VIII normal hearing to speech    CN IX & X normal palatal elevation, no uvular deviation    CN XI 5/5 head turn and 5/5 shoulder shrug bilaterally    CN XII midline tongue protrusion    Motor:  Muscle bulk: poor, tone normal Mvmt Root Nerve  Muscle Right Left Comments  SA C5/6 Ax Deltoid     EF C5/6 Mc Biceps 5  5   EE C6/7/8 Rad Triceps 5 5   WF C6/7 Med FCR     WE C7/8 PIN ECU     F Ab C8/T1 U ADM/FDI 4+ 4+   HF L1/2/3 Fem Illopsoas 4+ 4   KE L2/3/4 Fem Quad     DF L4/5 D Peron Tib Ant 5 4+   PF S1/2 Tibial Grc/Sol 5 4+    Reflexes:  Right Left Comments  Pectoralis  Biceps (C5/6)     Brachioradialis (C5/6) 2 2    Triceps (C6/7) 2 2    Patellar (L3/4) 3 3    Achilles (S1) 1 1    Hoffman      Plantar     Jaw jerk    Sensation:  Light touch Intact throughout   Pin prick    Temperature    Vibration   Proprioception    Coordination/Complex Motor:  - Finger to Nose intact BL - Heel to shin with ataxia in LLE. - Rapid alternating movement are slowed BL - Gait: deferred.  Labs/Imaging/Neurodiagnostic studies   CBC:  Recent Labs  Lab 12-24-2022 1025  WBC 5.6  NEUTROABS 4.5  HGB 11.1*  HCT 32.8*  MCV 98.5  PLT 189    Basic Metabolic Panel:  Lab Results  Component Value Date   NA 140 December 24, 2022   K 4.3 12-24-22   CO2 26 2022-12-24   GLUCOSE 117 (H) Dec 24, 2022   BUN 24 (H) December 24, 2022   CREATININE 1.65 (H) 2022/12/24   CALCIUM 9.0 Dec 24, 2022   GFRNONAA 47 (L) 12/24/22   GFRAA >60 12/24/2018    Lipid Panel: No results found for: "LDLCALC"  HgbA1c:  Lab Results  Component Value Date   HGBA1C 5.1 12/22/2018    Urine Drug Screen: No results found for: "LABOPIA", "COCAINSCRNUR", "LABBENZ", "AMPHETMU", "THCU", "LABBARB"   Alcohol Level No results found for: "ETH"  INR  Lab Results  Component Value Date   INR 1.1 12/20/2018    APTT  Lab Results  Component Value Date   APTT 40 (H) 12/20/2018    AED levels: No results found for: "PHENYTOIN", "ZONISAMIDE", "LAMOTRIGINE", "LEVETIRACETA"    CT Head without contrast(Personally reviewed): CTH was negative for a large hypodensity concerning for a large territory infarct or hyperdensity concerning for an ICH  CT angio Head and Neck with contrast(Personally reviewed): 1. Atheromatous change about the  proximal left V1 segment with associated intimal irregularity and small amount of intraluminal adherent thrombus. Findings could be related to a recently ruptured plaque or possibly short segment dissection. 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 No visible downstream embolic occlusion. 2. Additional atheromatous change about the proximal right V1 segment and bilateral V4 segments with associated moderate to severe stenoses as above. 3. Carotid bifurcation atherosclerotic change with up to 55% narrowing on the right and 75% narrowing on the left. 4. Atheromatous change about the carotid siphons with associated severe stenoses at the para clinoid segments bilaterally. 5. Small focus of patchy ground-glass density within the partially visualized right lung, likely mild pneumonitis  MRI Brain: Pending.   Impression   Frank Conley is a 62 y.o. male with hx of developmental delay, SSS, MGUS, seizures, HTN who for years has had hx of falls and slight lean to the left. Does have periods where he is better and periods where he is worse but over the last 2 weeks, been consistently leaning to the left with increase in falls. Exam with mild L leg weakness despite patient being left handed per brother at baseline along with LLE ataxia. CTA with ?adherent thrombus vs dissection in L vertebral artery.  Overall, with the noted exam and CTA findings, concern about potential left cerebellar stroke. Recommend admission for stroke workup.  Recommendations  - Frequent Neuro checks per stroke unit protocol - Recommend brain imaging with MRI Brain without contrast - Recommend obtaining TTE  - Recommend obtaining Lipid panel with LDL - Please start statin  if LDL > 70 - Recommend HbA1c to evaluate for diabetes and how well it is controlled. - Antithrombotic - Aspirin 81mg  daily along with plavix 75mg  daily x 3 months, followed by Aspirin 81mg  daily alone. - Repeat CTA head and neck in 2 months  to evalute for resolution. - Recommend DVT ppx - SBP goal - aim for gradual normotension. - Recommend Telemetry monitoring for arrythmia - Recommend bedside swallow screen prior to PO intake. - Stroke education booklet - Recommend PT/OT/SLP consult - Stroke team to follow along. ______________________________________________________________________    Signed,  Erick Blinks [facility]

## 2022-12-17 NOTE — Evaluation (Signed)
Occupational Therapy Evaluation Patient Details Name: Frank Conley MRN: [id number] DOB: 1960-11-30 Today's Date: 12/17/2022   History of Present Illness Pt is a 62 y.o. male who presented 12/16/22 with x2 week hx of worsening L lean and multiple falls. CT head negative for acute intracranial abnormality. CT Angio head and neck demonstrated atheromatous change about the proximal left V1 segment with associated intimal irregularity and small amount of intraluminal adherent thrombus. Findings could be related to a recently ruptured plaque or possibly short segment dissection. 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 Awaiting brain MRI. PMH: developmental delay, SSS, MGUS, seizures, HTN, s/p PPM   Clinical Impression   PTA, pt lived at group home and went to an associated day program during the day. Pt grossly supervision assist per his brother who is his [facility] POA. Upon eval, pt with generalized weakness and decreased balance. Suspect close to baseline, but secondary to balance difficulty, recommending initial OP OT eval at his day program to optimize return to PLOF. Will follow acutely to optimize strength and activity tolerance as they relate to ADL.       If plan is discharge home, recommend the following: A little help with walking and/or transfers;A little help with bathing/dressing/bathroom;Assistance with cooking/housework;Help with stairs or ramp for entrance;Direct supervision/assist for financial management;Direct supervision/assist for medications management;Assist for transportation    Functional Status Assessment  Patient has had a recent decline in their functional status and demonstrates the ability to make significant improvements in function in a reasonable and predictable amount of time.  Equipment Recommendations  None recommended by OT    Recommendations for Other Services       Precautions / Restrictions Precautions Precautions: Fall Restrictions Weight Bearing  Restrictions: No      Mobility Bed Mobility Overal bed mobility: Needs Assistance Bed Mobility: Supine to Sit, Sit to Supine     Supine to sit: Min assist Sit to supine: Min assist   General bed mobility comments: Min A for truncal elevation and to bring BLE back into bed.    Transfers Overall transfer level: Needs assistance Equipment used: None Transfers: Sit to/from Stand Sit to Stand: Contact guard assist           General transfer comment: for safety; min cues for technqiue      Balance Overall balance assessment: Needs assistance Sitting-balance support: No upper extremity supported, Feet supported Sitting balance-Leahy Scale: Fair     Standing balance support: No upper extremity supported, During functional activity Standing balance-Leahy Scale: Poor Standing balance comment: up to min A dynamically                           ADL either performed or assessed with clinical judgement   ADL Overall ADL's : Needs assistance/impaired Eating/Feeding: Set up   Grooming: Contact guard assist;Standing   Upper Body Bathing: Set up;Sitting   Lower Body Bathing: Moderate assistance;Sit to/from stand   Upper Body Dressing : Set up;Sitting   Lower Body Dressing: Moderate assistance;Sit to/from stand   Toilet Transfer: Contact guard assist;Ambulation           Functional mobility during ADLs: Contact guard assist;Minimal assistance General ADL Comments: CGA-min A for OOB mobility     Vision Baseline Vision/History: 1 Wears glasses Ability to See in Adequate Light: 0 Adequate Patient Visual Report: No change from baseline Additional Comments: Able to read therapist badge     Perception  Praxis         Pertinent Vitals/Pain Pain Assessment Pain Assessment: No/denies pain     Extremity/Trunk Assessment Upper Extremity Assessment Upper Extremity Assessment: Generalized weakness   Lower Extremity Assessment Lower Extremity  Assessment: Defer to PT evaluation       Communication Communication Communication: Difficulty communicating thoughts/reduced clarity of speech (expressive difficulty)   Cognition Arousal: Alert, Lethargic Behavior During Therapy: WFL for tasks assessed/performed Overall Cognitive Status: History of cognitive impairments - at baseline                                 General Comments: follows one step commands, oriented to month and year     General Comments  VSS    Exercises     Shoulder Instructions      Home Living Family/patient expects to be discharged to:: Group home Living Arrangements: Group Home ([name]) Available Help at Discharge: Other (Comment) (staff at group home) Type of Home: Group Home Home Access: Level entry Entrance Stairs-Number of Steps: 3 STE when he visits brother and his sister in law who are his [facility] POAs   Home Layout: One level     Bathroom Shower/Tub: Producer, television/film/video: Standard     Home Equipment: Shower seat;Grab bars - tub/shower   Additional Comments: Confirmed with brother. Pt with difficulty reporting PLOF but knew that he stayed at a group home with probing and that his brother also intermittently helps care for him      Prior Functioning/Environment Prior Level of Function : Patient poor historian/Family not available;Needs assist             Mobility Comments: No AD; brother on phone reports that staff uses gait belt occasioannly ADLs Comments: Assist for all IADL and supervision + for ADL. Goes to day program associated with group home called [facility] during the day from 8-330 in which he works on mobility, Solicitor (Tree surgeon), crafting, reading, etc        OT Problem List: Decreased strength;Decreased activity tolerance;Impaired balance (sitting and/or standing)      OT Treatment/Interventions: Self-care/ADL  training;Therapeutic exercise;DME and/or AE instruction;Balance training;Patient/family education;Therapeutic activities    OT Goals(Current goals can be found in the care plan section) Acute Rehab OT Goals Patient Stated Goal: none stated OT Goal Formulation: With patient Time For Goal Achievement: 12/31/22 Potential to Achieve Goals: Good  OT Frequency: Min 1X/week    Co-evaluation              AM-PAC OT "6 Clicks" Daily Activity     Outcome Measure Help from another person eating meals?: A Little Help from another person taking care of personal grooming?: A Little Help from another person toileting, which includes using toliet, bedpan, or urinal?: A Little Help from another person bathing (including washing, rinsing, drying)?: A Little Help from another person to put on and taking off regular upper body clothing?: A Little Help from another person to put on and taking off regular lower body clothing?: A Lot 6 Click Score: 17   End of Session Equipment Utilized During Treatment: Gait belt Nurse Communication: Mobility status  Activity Tolerance: Patient tolerated treatment well Patient left: in bed;with call bell/phone within reach  OT Visit Diagnosis: Unsteadiness on feet (R26.81);Muscle weakness (generalized) (M62.81);Other symptoms and signs involving cognitive function;History of falling (Z91.81)  Time: 9528-4132 OT Time Calculation (min): 20 min Charges:  OT General Charges $OT Visit: 1 Visit OT Evaluation $OT Eval Moderate Complexity: 1 Mod  Tyler Deis, OTR/L [facility] Acute Rehabilitation Office: [phone]   Myrla Halsted 12/17/2022, 2:51 PM

## 2022-12-17 NOTE — Assessment & Plan Note (Signed)
Continue Lamictal and Klonopin

## 2022-12-17 NOTE — Assessment & Plan Note (Addendum)
Suspicion of subacute stroke. Stroke pathway See Neuro note MRI brain Wont be done until Monday due to PPM (PPM is suspected to be an MRI conditional model) Tele monitor 2d echo PT/OT/SLP Normotension per neuro ASA 81 + plavix for 75 days then ASA alone. Needs repeat CTA in 2 months

## 2022-12-17 NOTE — Assessment & Plan Note (Signed)
Cont home norvasc and clonidine

## 2022-12-17 NOTE — Progress Notes (Signed)
   Patient seen and examined at bedside, patient admitted after midnight, please see earlier detailed admission note by Hillary Bow, DO. Briefly, patient presented secondary to left sided weakness and falls and found to have concern for possible stroke and/or vertebral artery dissection. Neurology consulted.  Subjective: Patient reports no concerns this morning. Waiting for his brother to return.  BP 117/70   Pulse 63   Temp 98.5 F (36.9 C) (Oral)   Resp 15   Ht 5\' 11"  (1.803 m)   Wt 83 kg   SpO2 99%   BMI 25.52 kg/m   General exam: Appears calm and comfortable Respiratory system: Clear to auscultation. Respiratory effort normal. Cardiovascular system: S1 & S2 heard, RRR. Gastrointestinal system: Abdomen is nondistended, soft and nontender. No organomegaly or masses felt. Normal bowel sounds heard. Central nervous system: Alert and oriented. 4/5 LE strength. 5/5 RUE strength and 4-5/5 LUE strength Musculoskeletal: No edema. No calf tenderness   Brief assessment/Plan:  Possible stroke CT head without evidence of stroke. CTA head and neck significant for possible recently ruptured blaque vs short segment dissection at left V1 segment in addition to carotid bifurcation atherosclerotic change with up to 55% narroing on right and 75% narrowing on left. LDL of 118. Hemoglobin A1C of 4.9%. Transthoracic Echocardiogram significant for normal LVEF of 55-60% with poor visualization of the interatrial septum. Neurology recommendations pending today. -Continue aspirin, Plavix, Crestor  Hyperlipidemia LDL of 118. Patient started on Crestor. -Continue Crestor 20 mg daily  Primary hypertension -Continue amlodipine and clonidine  Developmental delay disorder Noted. -Continue Seroquel  Seizure disorder -Continue Lamictal and Klonopin  Family communication: None at bedside. Brother and .sister-in-law DVT prophylaxis: Lovenox Disposition: Discharge pending therapy recommendations and  neurology recommendations  Jacquelin Hawking, MD [facility] 12/17/2022, 11:12 AM

## 2022-12-17 NOTE — ED Provider Notes (Signed)
Patient has been seen by Dr. Derry Lory of neurology.  He is concerned that patient could have experienced a cerebellar infarct within the past several weeks.  CTA was concerning for irregularities of the vertebral arteries and possible short segment dissection.  Recommends admission for stroke workup and MRI on Monday.  He will be loaded with aspirin and Plavix.  Discussed with Dr. Julian Reil.   Glynn Octave, MD 12/17/22 [phone]

## 2022-12-17 NOTE — Progress Notes (Addendum)
STROKE TEAM PROGRESS NOTE   BRIEF HPI  Frank Conley is a 62 y.o. male with hx of developmental delay, SSS, MGUS, seizures, HTN who for years has had hx of falls and slight lean to the left. DDoes have periods where he is better and periods where he is worse but over the last 2 weeks, been consistently leaning to the left with increase in falls. Exam with mild L leg weakness despite patient being left handed per brother at baseline along with LLE ataxia. CTA with ?adherent thrombus vs dissection in L vertebral artery.    He was transferred to [facility] to get MRI brain w/o contrast but with his pacemaker, unable to get it over the weekend.   SIGNIFICANT HOSPITAL EVENT CTA with ?adherent thrombus vs dissection in L vertebral artery. Moderate to severe stenosis in proximal R V1 and b/l V4 sgements Atherosclerotic change  up 55% on R and 75% of Left of carotid bifurcation.   INTERIM HISTORY/SUBJECTIVE Patient reports no difficulties this morning. Was having trouble walking at group home and fell a few times. Brother was concerned and wanted him evaluated. Denies headache or loss of consciousness during falling episodes. Has a pacemaker in place and understands he needs an MRI on Monday.  -Echo this morning.   OBJECTIVE  CBC    Component Value Date/Time   WBC 5.6 12/16/2022 1025   RBC 3.33 (L) 12/16/2022 1025   HGB 11.1 (L) 12/16/2022 1025   HGB 12.0 (L) 10/24/2013 1325   HCT 32.8 (L) 12/16/2022 1025   HCT 34.3 (L) 10/24/2013 1325   PLT 189 12/16/2022 1025   PLT 118 (L) 10/24/2013 1325   MCV 98.5 12/16/2022 1025   MCV 97 10/24/2013 1325   MCH 33.3 12/16/2022 1025   MCHC 33.8 12/16/2022 1025   RDW 14.7 12/16/2022 1025   RDW 14.4 10/24/2013 1325   LYMPHSABS 0.6 (L) 12/16/2022 1025   MONOABS 0.3 12/16/2022 1025   EOSABS 0.0 12/16/2022 1025   BASOSABS 0.0 12/16/2022 1025    BMET    Component Value Date/Time   NA 140 12/16/2022 1025   NA 138 10/25/2013 0412   K 4.3 12/16/2022 1025    K 3.9 10/25/2013 0412   CL 104 12/16/2022 1025   CL 102 10/25/2013 0412   CO2 26 12/16/2022 1025   CO2 26 10/25/2013 0412   GLUCOSE 117 (H) 12/16/2022 1025   GLUCOSE 72 10/25/2013 0412   BUN 24 (H) 12/16/2022 1025   BUN 17 10/25/2013 0412   CREATININE 1.65 (H) 12/16/2022 1025   CREATININE 0.63 10/25/2013 0412   CALCIUM 9.0 12/16/2022 1025   CALCIUM 8.6 10/25/2013 0412   GFRNONAA 47 (L) 12/16/2022 1025   GFRNONAA >60 10/25/2013 0412    IMAGING past 24 hours CT ANGIO HEAD NECK W WO CM  Result Date: 12/16/2022 CLINICAL DATA:  Initial evaluation for neuro deficit, stroke. EXAM: CT ANGIOGRAPHY HEAD AND NECK WITH AND WITHOUT CONTRAST TECHNIQUE: Multidetector CT imaging of the head and neck was performed using the standard protocol during bolus administration of intravenous contrast. Multiplanar CT image reconstructions and MIPs were obtained to evaluate the vascular anatomy. Carotid stenosis measurements (when applicable) are obtained utilizing NASCET criteria, using the distal internal carotid diameter as the denominator. RADIATION DOSE REDUCTION: This exam was performed according to the departmental dose-optimization program which includes automated exposure control, adjustment of the mA and/or kV according to patient size and/or use of iterative reconstruction technique. CONTRAST:  75mL OMNIPAQUE IOHEXOL 350 MG/ML SOLN  COMPARISON:  CT from earlier the same day. FINDINGS: CTA NECK FINDINGS Aortic arch: Visualized arch within normal limits for caliber. Bovine branching pattern noted. No stenosis about the origin of the great vessels. Right carotid system: Right common and internal carotid arteries are patent without dissection. Atheromatous change about the proximal cervical right ICA with associated stenosis of up to 55% by NASCET criteria. Left carotid system: Left common and internal carotid arteries are patent without dissection. Bulky calcified plaque about the proximal cervical left ICA with  associated severe stenosis of 75% by NASCET criteria. Vertebral arteries: Both vertebral arteries arise from subclavian arteries. No proximal subclavian artery stenosis. Proximal right vertebral artery not well seen due to adjacent venous contamination. Atheromatous plaque about the proximal right vertebral artery with up to moderate to severe proximal right V1 stenosis (series 7, image 263). Right vertebral artery otherwise patent distally without significant stenosis or dissection. Atheromatous change with intimal irregularity present about the proximal left V1 segment is seen. Small amount of intraluminal adherent thrombus seen at this location (series 7, image 261). Findings could be related to a recently ruptured plaque or possibly short segment dissection. Up to severe stenosis at the left V1/V2 junction (series 7, image 256). Left vertebral artery otherwise patent distally without significant stenosis. Skeleton: No discrete or worrisome osseous lesions. Bulky anterior osteophytic spurring noted throughout the cervical spine. Other neck: No other acute finding. Upper chest: Small focus of patchy ground-glass density within the partially visualized right lung, likely mild pneumonitis (series 5, image 170). Scattered atelectatic changes noted elsewhere within the visualized lungs. Review of the MIP images confirms the above findings CTA HEAD FINDINGS Anterior circulation: Atheromatous change about the carotid siphons with associated severe stenoses at the para clinoid segments bilaterally. A1 segments patent. Normal anterior communicating artery complex. Anterior cerebral arteries patent without stenosis. No M1 stenosis or occlusion. Distal MCA branches perfused and symmetric. Posterior circulation: Atheromatous change about the V4 segments bilaterally with severe stenosis on the right and moderate stenosis on the left. Both PICA patent. Basilar patent without stenosis. Superior cerebral arteries patent  bilaterally. Both PCAs supplied via the basilar well perfused or distal aspects without stenosis. Venous sinuses: Grossly patent allowing for timing the contrast bolus. Anatomic variants: None significant.  No aneurysm. Review of the MIP images confirms the above findings IMPRESSION: 1. Atheromatous change about the proximal left V1 segment with associated intimal irregularity and small amount of intraluminal adherent thrombus. Findings could be related to a recently ruptured plaque or possibly short segment dissection. 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 No visible downstream embolic occlusion. 2. Additional atheromatous change about the proximal right V1 segment and bilateral V4 segments with associated moderate to severe stenoses as above. 3. Carotid bifurcation atherosclerotic change with up to 55% narrowing on the right and 75% narrowing on the left. 4. Atheromatous change about the carotid siphons with associated severe stenoses at the para clinoid segments bilaterally. 5. Small focus of patchy ground-glass density within the partially visualized right lung, likely mild pneumonitis . Electronically Signed   By: Rise Mu M.D.   On: 12/16/2022 23:13   CT HEAD WO CONTRAST  Result Date: 12/16/2022 CLINICAL DATA:  Syncope/presyncope.  History of seizures. EXAM: CT HEAD WITHOUT CONTRAST TECHNIQUE: Contiguous axial images were obtained from the base of the skull through the vertex without intravenous contrast. RADIATION DOSE REDUCTION: This exam was performed according to the departmental dose-optimization program which includes automated exposure control, adjustment of the  mA and/or kV according to patient size and/or use of iterative reconstruction technique. COMPARISON:  10/20/2021 FINDINGS: Brain: The subdural hematoma along the falx has resolved. No mass lesion, hemorrhage, hydrocephalus, acute infarct, intra-axial, or extra-axial fluid collection. Vascular: No hyperdense vessel or  unexpected calcification. Skull: No significant soft tissue swelling. Hyperostosis frontalis interna. Sinuses/Orbits: Remote fracture of the left medial orbital wall. Normal paranasal sinuses. Hypoplastic left mastoid air cells. Other: None. IMPRESSION: No acute intracranial abnormality. Electronically Signed   By: Jeronimo Greaves M.D.   On: 12/16/2022 11:56    Vitals:   12/17/22 0800 12/17/22 0806 12/17/22 1000 12/17/22 1040  BP: 118/77  117/70   Pulse: 83  63   Resp: 17  15   Temp:  98.6 F (37 C)  98.5 F (36.9 C)  TempSrc:  Oral  Oral  SpO2: 97%  99%   Weight:      Height:         PHYSICAL EXAM General:  Alert, well-nourished, well-developed patient in no acute distress Psych:  Mood and affect appropriate for situation CV: Regular rate and rhythm on monitor Respiratory:  Regular, unlabored respirations on room air GI: Abdomen soft and nontender   NEURO:  Mental Status: AA&Oriented to person and setting. Mild developmental delay Speech/Language: speech is with mild dysarthria, patients states its his baseline.    Cranial Nerves:  II: PERRL. Visual fields full.  III, IV, VI: EOMI. Eyelids elevate symmetrically.  V: Sensation is intact to light touch and symmetrical to face.  VII: Face is symmetrical resting and smiling VIII: hearing intact to voice. IX, X: Palate elevates symmetrically. Phonation is normal.  ZO:[id number] shrug 5/5. XII: tongue is midline without fasciculations. Motor: 4/5 strength to all muscle groups tested.  Tone: is normal and bulk is low  Sensation- Intact to light touch bilaterally. Extinction absent to light touch to DSS.   Coordination: FTN intact bilaterally, HKS: no ataxia in BLE.No drift.  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 Moderate to severe stenosis in  proximal R V1 and b/l V4 sgements Atherosclerotic change  up 55% on R and 75% of Left of carotid bifurcation.  MRI  pending  2D Echo EF 55-60% Pacemaker interrogation no afib LDL 118 HgbA1c 4.9 UDS pending VTE prophylaxis - Lovenox  No antithrombotics PTA, now on Aspirin 81mg  and plavix 75mg  daily x 3 months, followed by Aspirin 81mg  daily alone.  Therapy recommendations:  pending  Disposition:  Group Home, pending stroke work-up and PT/OT evaluations   Hypertension  Home medications: amlodipine 10 mg daily and clonidine patch  BP stable on the low end On clonidine patch Hold amlodipine, given BP at goal and to prevent limiting blood flow in setting of moderate to severe vessel disease   Hyperlipidemia Home meds:  None  LDL 118, goal < 70 Add Crestor 20 mg daily   Continue statin at discharge  No History of DM  Home meds:  None HgbA1c 4.9, goal < 7.0  Other Active Problems Developmental delay, continue home meds (lamictal and seroquel) Hx of seizure, continue home lamictal and clonazepam SSS s/p pacer MGUS  Hospital day # 0    ATTENDING NOTE: I reviewed above note and agree with the assessment and plan. Pt was seen and examined.   No family at bedside.  Patient lying in  bed, eyes open, awake alert, orientated to place, age and time and people.  Moderate to severe dysarthria, seem to be chronic baseline.  Able to name 3/4 and repeat simple sentences in severely dysarthric voice.  Seem to have fluent speech and the following some commands, however incoherent content.  Visual fields full, no gaze palsy, no facial droop.  Tongue midline.  Bilateral upper and lower extremity strength symmetrical, bilateral finger-to-nose no ataxia, sensation symmetrical subjective.  Gait not tested.  Patient symptoms concerning for left cerebellar infarct, especially with left VA atherosclerosis with stenosis and possible mural thrombus from ulcerated plaque.  Put on aspirin and Plavix DAPT for  3 months and then aspirin alone.  Put him statin for HLD management.  Pending MRI.  Continue home Klonopin, Lamictal and Seroquel.  BP on the lower end, right he had clonidine patch, will hold off Norvasc.  BP goal normotensive but avoid hypotension.  PT and OT pending.  For detailed assessment and plan, please refer to above/below as I have made changes wherever appropriate.   Marvel Plan, MD PhD Stroke Neurology 12/17/2022 2:23 PM  I spent additional 30 minutes inpatient face-to-face time with the patient, more than 50% of which was spent in counseling and coordination of care, reviewing test results, images and medication, and discussing the diagnosis, treatment plan and potential prognosis. This patient's care requiresreview of multiple databases, neurological assessment, discussion with family, other specialists and medical decision making of high complexity.      To contact Stroke Continuity provider, please refer to [email or web address]. After hours, contact General Neurology

## 2022-12-17 NOTE — ED Notes (Signed)
ED TO INPATIENT HANDOFF REPORT  ED Nurse Name and Phone #: Kandi Brusseau [phone]  S Name/Age/Gender Frank Conley 62 y.o. male Room/Bed: 006C/006C  Code Status   Code Status: Do not attempt resuscitation (DNR) PRE-ARREST INTERVENTIONS DESIRED  Home/SNF/Other Home Patient oriented to: self, place, time, and situation Is this baseline? Yes   Triage Complete: Triage complete  Chief Complaint Acute ischemic stroke [facility]) [I63.9]  Triage Note PT lives at a group home.  He was eating breakfast this am and just dropped to the floor, though did not become unconscious.  Pt has been extensively worked up at [facility], but was unable to completed an MRI d/t his pacemaker.  Thus, pt was sent here for an MRI.   Per brother, pt mentation and balance is improving from several weeks ago.   Allergies Allergies  Allergen Reactions   Dilantin [Phenytoin]     Unknown reaction   Keppra [Levetiracetam]     Unknown reaction    Lisinopril     Unknown reaction     Level of Care/Admitting Diagnosis ED Disposition     ED Disposition  Admit   Condition  --   Comment  Hospital Area: [facility] [[id number]]  Level of Care: Telemetry Medical [104]  May admit patient to [facility] or [facility] if equivalent level of care is available:: No  Covid Evaluation: Asymptomatic - no recent exposure (last 10 days) testing not required  Diagnosis: Acute ischemic stroke [facility]) [366440]  Admitting Physician: Wyvonnia Dusky  Attending Physician: Hillary Bow [phone]  Certification:: I certify this patient will need inpatient services for at least 2 midnights  Expected Medical Readiness: 12/22/2022          B Medical/Surgery History Past Medical History:  Diagnosis Date   Depression    Hypertension    MGUS (monoclonal gammopathy of unknown significance)    Pervasive developmental disorder    Seizures (HCC)    SSS (sick sinus syndrome) (HCC)    PPM placed   Past Surgical  History:  Procedure Laterality Date   PACEMAKER IMPLANT       A IV Location/Drains/Wounds Patient Lines/Drains/Airways Status     Active Line/Drains/Airways     Name Placement date Placement time Site Days   Peripheral IV 12/16/22 20 G Posterior;Right Forearm 12/16/22  1023  Forearm  1            Intake/Output Last 24 hours  Intake/Output Summary (Last 24 hours) at 12/17/2022 1421 Last data filed at 12/17/2022 0859 Gross per 24 hour  Intake 250 ml  Output 750 ml  Net -500 ml    Labs/Imaging Results for orders placed or performed during the hospital encounter of 12/16/22 (from the past 48 hour(s))  HIV Antibody (routine testing w rflx)     Status: None   Collection Time: 12/17/22  3:01 AM  Result Value Ref Range   HIV Screen 4th Generation wRfx Non Reactive Non Reactive    Comment: Performed at [facility], [address] [place], [place] [address]  Lipid panel     Status: Abnormal   Collection Time: 12/17/22  3:01 AM  Result Value Ref Range   Cholesterol 191 0 - 200 mg/dL   Triglycerides 40 <387 mg/dL   HDL 65 >56 mg/dL   Total CHOL/HDL Ratio 2.9 RATIO   VLDL 8 0 - 40 mg/dL   LDL Cholesterol 433 (H) 0 - 99 mg/dL    Comment:  Total Cholesterol/HDL:CHD Risk Coronary Heart Disease Risk Table                     Men   Women  1/2 Average Risk   3.4   3.3  Average Risk       5.0   4.4  2 X Average Risk   9.6   7.1  3 X Average Risk  23.4   11.0        Use the calculated Patient Ratio above and the CHD Risk Table to determine the patient's CHD Risk.        ATP III CLASSIFICATION (LDL):  <100     mg/dL   Optimal  536-644  mg/dL   Near or Above                    Optimal  130-159  mg/dL   Borderline  034-742  mg/dL   High  >595     mg/dL   Very High Performed at [facility], [address] [place], [place] [address]   Hemoglobin A1c     Status: None   Collection Time: 12/17/22  3:01 AM  Result Value Ref Range   Hgb A1c MFr Bld 4.9  4.8 - 5.6 %    Comment: (NOTE) Pre diabetes:          5.7%-6.4%  Diabetes:              >6.4%  Glycemic control for   <7.0% adults with diabetes    Mean Plasma Glucose 93.93 mg/dL    Comment: Performed at [facility], [address], [place], [place] [address]   ECHOCARDIOGRAM COMPLETE  Result Date: 12/17/2022    ECHOCARDIOGRAM REPORT   Patient Name:   Frank Conley Date of Exam: 12/17/2022 Medical Rec #:  [id number]       Height:       71.0 in Accession #:    [id number]      Weight:       183.0 lb Date of Birth:  February 20, 1960       BSA:          2.030 m Patient Age:    62 years        BP:           118/77 mmHg Patient Gender: M               HR:           68 bpm. Exam Location:  Inpatient Procedure: 2D Echo, Cardiac Doppler and Color Doppler Indications:    Stroke  History:        Patient has no prior history of Echocardiogram examinations.                 Risk Factors:Hypertension.  Sonographer:    Darlys Gales Referring Phys: [phone] JARED M GARDNER IMPRESSIONS  1. Left ventricular ejection fraction, by estimation, is 55 to 60%. Left ventricular ejection fraction by PLAX is 58 %. The left ventricle has normal function. The left ventricle has no regional wall motion abnormalities. There is mild left ventricular hypertrophy. Left ventricular diastolic parameters are consistent with Grade I diastolic dysfunction (impaired relaxation).  2. Right ventricular systolic function is normal. The right ventricular size is normal.  3. The mitral valve is grossly normal. Trivial mitral valve regurgitation.  4. The aortic valve was not well visualized. Aortic valve regurgitation is not visualized. Comparison(s):  No prior Echocardiogram. FINDINGS  Left Ventricle: Left ventricular ejection fraction, by estimation, is 55 to 60%. Left ventricular ejection fraction by PLAX is 58 %. The left ventricle has normal function. The left ventricle has no regional wall motion abnormalities. The left ventricular internal  cavity size was normal in size. There is mild left ventricular hypertrophy. Left ventricular diastolic parameters are consistent with Grade I diastolic dysfunction (impaired relaxation). Indeterminate filling pressures. Right Ventricle: The right ventricular size is normal. No increase in right ventricular wall thickness. Right ventricular systolic function is normal. Left Atrium: Left atrial size was normal in size. Right Atrium: Right atrial size was normal in size. Pericardium: There is no evidence of pericardial effusion. Mitral Valve: The mitral valve is grossly normal. Trivial mitral valve regurgitation. Tricuspid Valve: The tricuspid valve is grossly normal. Tricuspid valve regurgitation is mild. Aortic Valve: The aortic valve was not well visualized. Aortic valve regurgitation is not visualized. Aortic valve mean gradient measures 3.0 mmHg. Aortic valve peak gradient measures 5.4 mmHg. Aortic valve area, by VTI measures 2.83 cm. Pulmonic Valve: The pulmonic valve was not well visualized. Pulmonic valve regurgitation is not visualized. Aorta: The aortic root and ascending aorta are structurally normal, with no evidence of dilitation. IAS/Shunts: The interatrial septum was not well visualized.  LEFT VENTRICLE PLAX 2D LV EF:         Left            Diastology                ventricular     LV e' medial:    4.24 cm/s                ejection        LV E/e' medial:  15.4                fraction by     LV e' lateral:   5.22 cm/s                PLAX is 58      LV E/e' lateral: 12.5                %. LVIDd:         3.70 cm LVIDs:         2.60 cm LV PW:         1.10 cm LV IVS:        0.90 cm LVOT diam:     2.00 cm LV SV:         60 LV SV Index:   30 LVOT Area:     3.14 cm  RIGHT VENTRICLE RV S prime:     11.40 cm/s TAPSE (M-mode): 1.5 cm LEFT ATRIUM             Index        RIGHT ATRIUM           Index LA Vol (A2C):   39.5 ml 19.45 ml/m  RA Area:     10.10 cm LA Vol (A4C):   58.2 ml 28.66 ml/m  RA Volume:   20.80  ml  10.24 ml/m LA Biplane Vol: 48.1 ml 23.69 ml/m  AORTIC VALVE AV Area (Vmax):    2.58 cm AV Area (Vmean):   2.69 cm AV Area (VTI):     2.83 cm AV Vmax:           116.00 cm/s AV Vmean:  76.900 cm/s AV VTI:            0.213 m AV Peak Grad:      5.4 mmHg AV Mean Grad:      3.0 mmHg LVOT Vmax:         95.40 cm/s LVOT Vmean:        65.900 cm/s LVOT VTI:          0.192 m LVOT/AV VTI ratio: 0.90  AORTA Ao Root diam: 2.70 cm Ao Asc diam:  3.00 cm MITRAL VALVE               TRICUSPID VALVE MV Area (PHT): 2.06 cm    TR Peak grad:   20.2 mmHg MV Decel Time: 368 msec    TR Vmax:        225.00 cm/s MV E velocity: 65.40 cm/s MV A velocity: 88.50 cm/s  SHUNTS MV E/A ratio:  0.74        Systemic VTI:  0.19 m                            Systemic Diam: 2.00 cm Zoila Shutter MD Electronically signed by Zoila Shutter MD Signature Date/Time: 12/17/2022/12:19:15 PM    Final    CT ANGIO HEAD NECK W WO CM  Result Date: 12/16/2022 CLINICAL DATA:  Initial evaluation for neuro deficit, stroke. EXAM: CT ANGIOGRAPHY HEAD AND NECK WITH AND WITHOUT CONTRAST TECHNIQUE: Multidetector CT imaging of the head and neck was performed using the standard protocol during bolus administration of intravenous contrast. Multiplanar CT image reconstructions and MIPs were obtained to evaluate the vascular anatomy. Carotid stenosis measurements (when applicable) are obtained utilizing NASCET criteria, using the distal internal carotid diameter as the denominator. RADIATION DOSE REDUCTION: This exam was performed according to the departmental dose-optimization program which includes automated exposure control, adjustment of the mA and/or kV according to patient size and/or use of iterative reconstruction technique. CONTRAST:  75mL OMNIPAQUE IOHEXOL 350 MG/ML SOLN COMPARISON:  CT from earlier the same day. FINDINGS: CTA NECK FINDINGS Aortic arch: Visualized arch within normal limits for caliber. Bovine branching pattern noted. No stenosis about  the origin of the great vessels. Right carotid system: Right common and internal carotid arteries are patent without dissection. Atheromatous change about the proximal cervical right ICA with associated stenosis of up to 55% by NASCET criteria. Left carotid system: Left common and internal carotid arteries are patent without dissection. Bulky calcified plaque about the proximal cervical left ICA with associated severe stenosis of 75% by NASCET criteria. Vertebral arteries: Both vertebral arteries arise from subclavian arteries. No proximal subclavian artery stenosis. Proximal right vertebral artery not well seen due to adjacent venous contamination. Atheromatous plaque about the proximal right vertebral artery with up to moderate to severe proximal right V1 stenosis (series 7, image 263). Right vertebral artery otherwise patent distally without significant stenosis or dissection. Atheromatous change with intimal irregularity present about the proximal left V1 segment is seen. Small amount of intraluminal adherent thrombus seen at this location (series 7, image 261). Findings could be related to a recently ruptured plaque or possibly short segment dissection. Up to severe stenosis at the left V1/V2 junction (series 7, image 256). Left vertebral artery otherwise patent distally without significant stenosis. Skeleton: No discrete or worrisome osseous lesions. Bulky anterior osteophytic spurring noted throughout the cervical spine. Other neck: No other acute finding. Upper chest: Small focus of patchy ground-glass density within the partially  visualized right lung, likely mild pneumonitis (series 5, image 170). Scattered atelectatic changes noted elsewhere within the visualized lungs. Review of the MIP images confirms the above findings CTA HEAD FINDINGS Anterior circulation: Atheromatous change about the carotid siphons with associated severe stenoses at the para clinoid segments bilaterally. A1 segments patent. Normal  anterior communicating artery complex. Anterior cerebral arteries patent without stenosis. No M1 stenosis or occlusion. Distal MCA branches perfused and symmetric. Posterior circulation: Atheromatous change about the V4 segments bilaterally with severe stenosis on the right and moderate stenosis on the left. Both PICA patent. Basilar patent without stenosis. Superior cerebral arteries patent bilaterally. Both PCAs supplied via the basilar well perfused or distal aspects without stenosis. Venous sinuses: Grossly patent allowing for timing the contrast bolus. Anatomic variants: None significant.  No aneurysm. Review of the MIP images confirms the above findings IMPRESSION: 1. Atheromatous change about the proximal left V1 segment with associated intimal irregularity and small amount of intraluminal adherent thrombus. Findings could be related to a recently ruptured plaque or possibly short segment dissection. 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 No visible downstream embolic occlusion. 2. Additional atheromatous change about the proximal right V1 segment and bilateral V4 segments with associated moderate to severe stenoses as above. 3. Carotid bifurcation atherosclerotic change with up to 55% narrowing on the right and 75% narrowing on the left. 4. Atheromatous change about the carotid siphons with associated severe stenoses at the para clinoid segments bilaterally. 5. Small focus of patchy ground-glass density within the partially visualized right lung, likely mild pneumonitis . Electronically Signed   By: Rise Mu M.D.   On: 12/16/2022 23:13   CT HEAD WO CONTRAST  Result Date: 12/16/2022 CLINICAL DATA:  Syncope/presyncope.  History of seizures. EXAM: CT HEAD WITHOUT CONTRAST TECHNIQUE: Contiguous axial images were obtained from the base of the skull through the vertex without intravenous contrast. RADIATION DOSE REDUCTION: This exam was performed according to the departmental  dose-optimization program which includes automated exposure control, adjustment of the mA and/or kV according to patient size and/or use of iterative reconstruction technique. COMPARISON:  10/20/2021 FINDINGS: Brain: The subdural hematoma along the falx has resolved. No mass lesion, hemorrhage, hydrocephalus, acute infarct, intra-axial, or extra-axial fluid collection. Vascular: No hyperdense vessel or unexpected calcification. Skull: No significant soft tissue swelling. Hyperostosis frontalis interna. Sinuses/Orbits: Remote fracture of the left medial orbital wall. Normal paranasal sinuses. Hypoplastic left mastoid air cells. Other: None. IMPRESSION: No acute intracranial abnormality. Electronically Signed   By: Jeronimo Greaves M.D.   On: 12/16/2022 11:56    Pending Labs Unresulted Labs (From admission, onward)     Start     Ordered   12/17/22 0834  Rapid urine drug screen (hospital performed)  ONCE - STAT,   STAT        12/17/22 0834            Vitals/Pain Today's Vitals   12/17/22 1040 12/17/22 1100 12/17/22 1300 12/17/22 1317  BP:  132/73 134/77   Pulse:  (!) 56 (!) 59   Resp:  14 15   Temp: 98.5 F (36.9 C)   98.6 F (37 C)  TempSrc: Oral   Oral  SpO2:  98% 99%   Weight:      Height:      PainSc:        Isolation Precautions No active isolations  Medications Medications   stroke: early stages of recovery book (has no administration in time range)  acetaminophen (  TYLENOL) tablet 650 mg (has no administration in time range)    Or  acetaminophen (TYLENOL) 160 MG/5ML solution 650 mg (has no administration in time range)    Or  acetaminophen (TYLENOL) suppository 650 mg (has no administration in time range)  enoxaparin (LOVENOX) injection 40 mg (40 mg Subcutaneous Given 12/17/22 1027)  clonazepam (KLONOPIN) disintegrating tablet 0.5 mg (0.5 mg Oral Given 12/17/22 1026)  QUEtiapine (SEROQUEL) tablet 150 mg (has no administration in time range)  QUEtiapine (SEROQUEL) tablet 25  mg (has no administration in time range)  senna (SENOKOT) tablet 17.2 mg (has no administration in time range)  polyethylene glycol (MIRALAX / GLYCOLAX) packet 17 g (17 g Oral Given 12/17/22 1027)  omega-3 acid ethyl esters (LOVAZA) capsule 2,000 mg (2,000 mg Oral Given 12/17/22 1026)  ferrous sulfate tablet 325 mg (325 mg Oral Given 12/17/22 1027)  melatonin tablet 3 mg (has no administration in time range)  loratadine (CLARITIN) tablet 10 mg (10 mg Oral Given 12/17/22 1027)  aspirin EC tablet 81 mg (81 mg Oral Given 12/17/22 1027)  clopidogrel (PLAVIX) tablet 75 mg (75 mg Oral Given 12/17/22 1027)  cloNIDine (CATAPRES - Dosed in mg/24 hr) patch 0.2 mg (has no administration in time range)  lamoTRIgine (LAMICTAL) tablet 150 mg (150 mg Oral Given 12/17/22 1026)  lamoTRIgine (LAMICTAL) tablet 175 mg (has no administration in time range)  QUEtiapine (SEROQUEL) tablet 100 mg (100 mg Oral Given 12/17/22 1317)  multivitamin-lutein (OCUVITE-LUTEIN) capsule 1 capsule (1 capsule Oral Given 12/17/22 1038)  rosuvastatin (CRESTOR) tablet 20 mg (20 mg Oral Given 12/17/22 1301)  iohexol (OMNIPAQUE) 350 MG/ML injection 75 mL (75 mLs Intravenous Contrast Given 12/16/22 2131)    Mobility walks with person assist     Focused Assessments    R Recommendations: See Admitting Provider Note  Report given to:   Additional Notes:

## 2022-12-17 NOTE — Assessment & Plan Note (Signed)
Cont seroquel, klonopin

## 2022-12-17 NOTE — ED Notes (Signed)
Went to bedside and asked patient how he uses the restroom. He states he can use the urinal. He denies having to go at this moment. Gave patient a new urinal and it is at bedside.

## 2022-12-17 NOTE — Evaluation (Signed)
Physical Therapy Evaluation Patient Details Name: Frank Conley MRN: [id number] DOB: 16-Dec-1960 Today's Date: 12/17/2022  History of Present Illness  Pt is a 62 y.o. male who presented 12/16/22 with x2 week hx of worsening L lean and multiple falls. CT head negative for acute intracranial abnormality. CT Angio head and neck demonstrated atheromatous change about the proximal left V1 segment with associated intimal irregularity and small amount of intraluminal adherent thrombus. Findings could be related to a recently ruptured plaque or possibly short segment dissection. 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 Awaiting brain MRI. PMH: developmental delay, SSS, MGUS, seizures, HTN, s/p PPM   Clinical Impression  Pt presents with condition above and deficits mentioned below, see PT Problem List. Pt lives at a group home with 1-level and a level entrance. PTA, he was able to perform functional mobility with AD with intermittent support from his group home staff utilizing a gait belt. Currently, pt displays L leg weakness compared to his R along with deficits in coordination, balance, and activity tolerance. He displays deficits in cognition also, which may be his baseline. No family present to confirm at time of eval. He is at high risk for falls, displaying a tendency to swing his L leg too medially, resulting in him tripping himself when not utilizing an AD and thereby requiring modA to recover his LOB. He displays improved stability and only needs CGA-minA for balance when he utilizes a RW. Thus, recommending pt utilize a RW for all standing mobility at d/c. Recommending pt follow-up with OPPT at his day program [facility] to further address his deficits above and reduce his risk for falls. Will continue to follow acutely.      If plan is discharge home, recommend the following: A little help with walking and/or transfers;A little help with bathing/dressing/bathroom;Assistance with  cooking/housework;Direct supervision/assist for medications management;Direct supervision/assist for financial management;Assist for transportation;Help with stairs or ramp for entrance   Can travel by private vehicle        Equipment Recommendations Rolling walker (2 wheels)  Recommendations for Other Services       Functional Status Assessment Patient has had a recent decline in their functional status and demonstrates the ability to make significant improvements in function in a reasonable and predictable amount of time.     Precautions / Restrictions Precautions Precautions: Fall Restrictions Weight Bearing Restrictions: No      Mobility  Bed Mobility Overal bed mobility: Needs Assistance Bed Mobility: Supine to Sit, Sit to Supine     Supine to sit: Contact guard, HOB elevated Sit to supine: Contact guard assist, HOB elevated   General bed mobility comments: Extra time, CGA for safety transitioning supine <> sit R edge of stretcher    Transfers Overall transfer level: Needs assistance Equipment used: Rolling walker (2 wheels) Transfers: Sit to/from Stand Sit to Stand: Contact guard assist           General transfer comment: Pt able to come to stand from edge of stretcher with CGA for safety, no LOB noted but pt appeared mildly unstable    Ambulation/Gait Ambulation/Gait assistance: Contact guard assist, Min assist, Mod assist Gait Distance (Feet): 100 Feet Assistive device: Rolling walker (2 wheels), None Gait Pattern/deviations: Step-through pattern, Decreased stride length, Trunk flexed, Narrow base of support Gait velocity: reduced Gait velocity interpretation: <1.8 ft/sec, indicate of risk for recurrent falls   General Gait Details: Pt initially ambulated without UE support but quickly crossed his L leg  behind his R leg and tripped himself, needing modA to recover. Transitioned pt to RW with pt noted to be more stable and only needing CGA-minA for balance  with no further LOB bouts noted. VCs provided to widen BOS and for RW proximity.  Stairs            Wheelchair Mobility     Tilt Bed    Modified Rankin (Stroke Patients Only) Modified Rankin (Stroke Patients Only) Pre-Morbid Rankin Score: Moderate disability Modified Rankin: Moderately severe disability     Balance Overall balance assessment: Needs assistance Sitting-balance support: No upper extremity supported, Feet supported Sitting balance-Leahy Scale: Fair     Standing balance support: No upper extremity supported, During functional activity, Bilateral upper extremity supported Standing balance-Leahy Scale: Poor Standing balance comment: 1 LOB needing modA to recover without UE support, CGA-minA with no LOB when utilizing RW                             Pertinent Vitals/Pain Pain Assessment Pain Assessment: Faces Faces Pain Scale: Hurts little more Pain Location: generalized Pain Descriptors / Indicators: Discomfort, Grimacing, Guarding Pain Intervention(s): Limited activity within patient's tolerance, Monitored during session, Repositioned    Home Living Family/patient expects to be discharged to:: Group home Living Arrangements: Group Home ([name]) Available Help at Discharge: Other (Comment) (staff at group home) Type of Home: Group Home Home Access: Level entry   Entrance Stairs-Number of Steps: 3 STE when he visits brother and his sister in law who are his [facility] POAs   Home Layout: One level Home Equipment: Shower seat;Grab bars - tub/shower Additional Comments: Confirmed with brother. Pt with difficulty reporting PLOF but knew that he stayed at a group home with probing and that his brother also intermittently helps care for him    Prior Function Prior Level of Function : Patient poor historian/Family not available;Needs assist             Mobility Comments: No AD; brother on phone reports  that staff uses gait belt occasionally ADLs Comments: Assist for all IADL and supervision + for ADL. Goes to day program associated with group home called [facility] during the day from 8-330 in which he works on mobility, Solicitor (Tree surgeon), Clinical cytogeneticist, reading, etc     Extremity/Trunk Assessment   Upper Extremity Assessment Upper Extremity Assessment: Defer to OT evaluation    Lower Extremity Assessment Lower Extremity Assessment: Generalized weakness;LLE deficits/detail LLE Deficits / Details: L leg noted to be weaker than R leg; gross MMT scores of 4 on L; gross incoordination noted with functional mobility    Cervical / Trunk Assessment Cervical / Trunk Assessment: Kyphotic  Communication   Communication Communication: Difficulty communicating thoughts/reduced clarity of speech (expressive difficulty)  Cognition Arousal: Alert, Lethargic Behavior During Therapy: WFL for tasks assessed/performed Overall Cognitive Status: History of cognitive impairments - at baseline                                 General Comments: follows one step commands, oriented to month and year; pt inconsistent in providing PLOF/home info, demonstrating poor memory        General Comments General comments (skin integrity, edema, etc.): educated pt on recs to use RW for all standing mobility, he verbalized understanding    Exercises     Assessment/Plan  PT Assessment Patient needs continued PT services  PT Problem List Decreased strength;Decreased activity tolerance;Decreased mobility;Decreased balance;Decreased cognition;Decreased coordination;Decreased safety awareness;Decreased knowledge of use of DME       PT Treatment Interventions DME instruction;Gait training;Stair training;Functional mobility training;Therapeutic activities;Therapeutic exercise;Balance training;Neuromuscular re-education;Cognitive remediation;Patient/family education    PT Goals  (Current goals can be found in the Care Plan section)  Acute Rehab PT Goals Patient Stated Goal: to rest PT Goal Formulation: With patient Time For Goal Achievement: 12/31/22 Potential to Achieve Goals: Fair    Frequency Min 1X/week     Co-evaluation               AM-PAC PT "6 Clicks" Mobility  Outcome Measure Help needed turning from your back to your side while in a flat bed without using bedrails?: A Little Help needed moving from lying on your back to sitting on the side of a flat bed without using bedrails?: A Little Help needed moving to and from a bed to a chair (including a wheelchair)?: A Little Help needed standing up from a chair using your arms (e.g., wheelchair or bedside chair)?: A Little Help needed to walk in hospital room?: A Lot Help needed climbing 3-5 steps with a railing? : Total 6 Click Score: 15    End of Session Equipment Utilized During Treatment: Gait belt Activity Tolerance: Patient tolerated treatment well Patient left: in bed;with call bell/phone within reach;Other (comment) (going with transport for test) Nurse Communication: Mobility status PT Visit Diagnosis: Unsteadiness on feet (R26.81);Other abnormalities of gait and mobility (R26.89);Muscle weakness (generalized) (M62.81);Difficulty in walking, not elsewhere classified (R26.2);Other symptoms and signs involving the nervous system (R29.898)    Time: 4098-1191 PT Time Calculation (min) (ACUTE ONLY): 23 min   Charges:   PT Evaluation $PT Eval Moderate Complexity: 1 Mod PT Treatments $Gait Training: 8-22 mins PT General Charges $$ ACUTE PT VISIT: 1 Visit         Frank Conley, PT, DPT Acute Rehabilitation Services  Office: [phone]   Bettina Gavia 12/17/2022, 3:09 PM

## 2022-12-17 NOTE — H&P (Signed)
History and Physical    [name] ZOX:[id number] DOB: 06/22/1960 DOA: 12/16/2022 DOS: the patient was seen and examined on 12/17/2022 PCP: Billee Cashing, MD  Patient coming from: Home  Chief Complaint:  Chief Complaint  Patient presents with   Fall   HPI: Frank Conley is a 62 y.o. male with medical history significant of SSS, s/p PPM, developmental delay, MGUS, HTN, seizures.  Pt in to ED with L sided weakness / leaning to left over past 2 weeks.  Increased falls in that time period.  Brother brought him in to get checked out. Initially presented to [facility] ED where CT Head w/o contrast was negative for any acute intracranial abnormalities.   He was transferred to [facility] to get MRI brain w/o contrast but with his pacemaker, unable to get it over the weekend.  CT Angio head and neck which demonstrated atheromatous change about the proximal left V1 segment with associated intimal irregularity and small amount of intraluminal adherent thrombus. Findings could be related to a recently ruptured plaque or possibly short segment dissection. 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  Neuro does feel that its likely he has had a subacute posterior stroke.  Review of Systems: As mentioned in the history of present illness. All other systems reviewed and are negative. Past Medical History:  Diagnosis Date   Depression    Hypertension    MGUS (monoclonal gammopathy of unknown significance)    Pervasive developmental disorder    Seizures (HCC)    SSS (sick sinus syndrome) (HCC)    PPM placed   Past Surgical History:  Procedure Laterality Date   PACEMAKER IMPLANT     Social History:  reports that he has never smoked. He has never used smokeless tobacco. He reports that he does not drink alcohol and does not use drugs.  Allergies  Allergen Reactions   Dilantin [Phenytoin]     Unknown reaction   Keppra [Levetiracetam]     Unknown reaction    Lisinopril      Unknown reaction     Family History  Family history unknown: Yes    Prior to Admission medications   Medication Sig Start Date End Date Taking? Authorizing Provider  amLODipine (NORVASC) 10 MG tablet Take 10 mg by mouth daily.   Yes [provider]  clonazePAM (KLONOPIN) 0.5 MG disintegrating tablet Take 0.5 mg by mouth 3 (three) times daily. 12/13/18  Yes [provider]  cloNIDine (CATAPRES - DOSED IN MG/24 HR) 0.2 mg/24hr patch Place 0.2 mg onto the skin once a week. Thursday 11/29/18  Yes [provider]  ferrous sulfate 325 (65 FE) MG tablet Take 325 mg by mouth daily.   Yes [provider]  lamoTRIgine (LAMICTAL) 100 MG tablet Take 100 mg by mouth 2 (two) times daily. 12/04/18  Yes [provider]  lamoTRIgine (LAMICTAL) 25 MG tablet Take 50-75 mg by mouth See admin instructions. Take in addition to 100 mg tablet for a total morning dose of 150mg  and bedtime dose of 175mg  12/04/18  Yes [provider]  loratadine (CLARITIN) 10 MG tablet Take 10 mg by mouth daily.   Yes [provider]  Melatonin 3 MG TABS Take 3 mg by mouth every evening.   Yes [provider]  Multiple Vitamins-Minerals (PRESERVISION AREDS 2) CAPS Take 1 capsule by mouth 2 (two) times daily.   Yes [provider]  Omega-3 Fatty Acids (FISH OIL) 1000 MG CAPS Take 2,000  mg by mouth daily.   Yes [provider]  polyethylene glycol (MIRALAX / GLYCOLAX) 17 g packet Take 17 g by mouth in the morning, at noon, and at bedtime.   Yes [provider]  QUEtiapine (SEROQUEL) 100 MG tablet Take 100-150 mg by mouth See admin instructions. Take 1 tablet by mouth twice a day then 1.5 tablets at bedtime 12/04/18  Yes [provider]  QUEtiapine (SEROQUEL) 25 MG tablet Take 25 mg by mouth daily as needed (severe agitation). 11/07/18  Yes [provider]  senna (SENOKOT) 8.6 MG TABS tablet Take 2 tablets by mouth at bedtime.    Yes [provider]    Physical Exam: Vitals:   12/17/22 0130 12/17/22 0245 12/17/22 0257 12/17/22 0416  BP: 135/83 136/81  137/83  Pulse: 69 63  63  Resp:    15  Temp:   98.4 F (36.9 C) 98.7 F (37.1 C)  TempSrc:   Oral Oral  SpO2: 100% 100%  95%  Weight:      Height:       Constitutional: NAD, calm, comfortable Respiratory: clear to auscultation bilaterally, no wheezing, no crackles. Normal respiratory effort. No accessory muscle use.  Cardiovascular: Regular rate and rhythm, no murmurs / rubs / gallops. No extremity edema. 2+ pedal pulses. No carotid bruits.  Abdomen: no tenderness, no masses palpated. No hepatosplenomegaly. Bowel sounds positive.  Neurologic: LLE ataxia, 4/5 LLE Psychiatric: Normal judgment and insight. Alert and oriented x 3. Normal mood.   Data Reviewed:    Labs on Admission: I have personally reviewed following labs and imaging studies  CBC: Recent Labs  Lab 12/16/22 1025  WBC 5.6  NEUTROABS 4.5  HGB 11.1*  HCT 32.8*  MCV 98.5  PLT 189   Basic Metabolic Panel: Recent Labs  Lab 12/16/22 1025  NA 140  K 4.3  CL 104  CO2 26  GLUCOSE 117*  BUN 24*  CREATININE 1.65*  CALCIUM 9.0  MG 2.2   GFR: Estimated Creatinine Clearance: 50.7 mL/min (A) (by C-G formula based on SCr of 1.65 mg/dL (H)). Liver Function Tests: Recent Labs  Lab 12/16/22 1025  AST 22  ALT 12  ALKPHOS 99  BILITOT 0.4  PROT 7.2  ALBUMIN 3.9   No results for input(s): "LIPASE", "AMYLASE" in the last 168 hours. No results for input(s): "AMMONIA" in the last 168 hours. Coagulation Profile: No results for input(s): "INR", "PROTIME" in the last 168 hours. Cardiac Enzymes: No results for input(s): "CKTOTAL", "CKMB", "CKMBINDEX", "TROPONINI" in the last 168 hours. BNP (last 3 results) No results for input(s): "PROBNP" in the last 8760 hours. HbA1C: Recent Labs    12/17/22 0301  HGBA1C 4.9   CBG: No results for input(s): "GLUCAP" in the last 168  hours. Lipid Profile: Recent Labs    12/17/22 0301  CHOL 191  HDL 65  LDLCALC 118*  TRIG 40  CHOLHDL 2.9   Thyroid Function Tests: No results for input(s): "TSH", "T4TOTAL", "FREET4", "T3FREE", "THYROIDAB" in the last 72 hours. Anemia Panel: No results for input(s): "VITAMINB12", "FOLATE", "FERRITIN", "TIBC", "IRON", "RETICCTPCT" in the last 72 hours. Urine analysis:    Component Value Date/Time   COLORURINE YELLOW (A) 12/20/2018 0301   APPEARANCEUR CLEAR (A) 12/20/2018 0301   APPEARANCEUR Clear 10/24/2013 2006   LABSPEC 1.018 12/20/2018 0301   LABSPEC 1.006 10/24/2013 2006   PHURINE 6.0 12/20/2018 0301   GLUCOSEU NEGATIVE 12/20/2018 0301   GLUCOSEU 150 mg/dL 40/98/1191 4782   HGBUR NEGATIVE 12/20/2018  0301   BILIRUBINUR NEGATIVE 12/20/2018 0301   BILIRUBINUR Negative 10/24/2013 2006   KETONESUR 5 (A) 12/20/2018 0301   PROTEINUR 30 (A) 12/20/2018 0301   NITRITE NEGATIVE 12/20/2018 0301   LEUKOCYTESUR NEGATIVE 12/20/2018 0301   LEUKOCYTESUR Negative 10/24/2013 2006    Radiological Exams on Admission: CT ANGIO HEAD NECK W WO CM  Result Date: 12/16/2022 CLINICAL DATA:  Initial evaluation for neuro deficit, stroke. EXAM: CT ANGIOGRAPHY HEAD AND NECK WITH AND WITHOUT CONTRAST TECHNIQUE: Multidetector CT imaging of the head and neck was performed using the standard protocol during bolus administration of intravenous contrast. Multiplanar CT image reconstructions and MIPs were obtained to evaluate the vascular anatomy. Carotid stenosis measurements (when applicable) are obtained utilizing NASCET criteria, using the distal internal carotid diameter as the denominator. RADIATION DOSE REDUCTION: This exam was performed according to the departmental dose-optimization program which includes automated exposure control, adjustment of the mA and/or kV according to patient size and/or use of iterative reconstruction technique. CONTRAST:  75mL OMNIPAQUE IOHEXOL 350 MG/ML SOLN COMPARISON:  CT  from earlier the same day. FINDINGS: CTA NECK FINDINGS Aortic arch: Visualized arch within normal limits for caliber. Bovine branching pattern noted. No stenosis about the origin of the great vessels. Right carotid system: Right common and internal carotid arteries are patent without dissection. Atheromatous change about the proximal cervical right ICA with associated stenosis of up to 55% by NASCET criteria. Left carotid system: Left common and internal carotid arteries are patent without dissection. Bulky calcified plaque about the proximal cervical left ICA with associated severe stenosis of 75% by NASCET criteria. Vertebral arteries: Both vertebral arteries arise from subclavian arteries. No proximal subclavian artery stenosis. Proximal right vertebral artery not well seen due to adjacent venous contamination. Atheromatous plaque about the proximal right vertebral artery with up to moderate to severe proximal right V1 stenosis (series 7, image 263). Right vertebral artery otherwise patent distally without significant stenosis or dissection. Atheromatous change with intimal irregularity present about the proximal left V1 segment is seen. Small amount of intraluminal adherent thrombus seen at this location (series 7, image 261). Findings could be related to a recently ruptured plaque or possibly short segment dissection. Up to severe stenosis at the left V1/V2 junction (series 7, image 256). Left vertebral artery otherwise patent distally without significant stenosis. Skeleton: No discrete or worrisome osseous lesions. Bulky anterior osteophytic spurring noted throughout the cervical spine. Other neck: No other acute finding. Upper chest: Small focus of patchy ground-glass density within the partially visualized right lung, likely mild pneumonitis (series 5, image 170). Scattered atelectatic changes noted elsewhere within the visualized lungs. Review of the MIP images confirms the above findings CTA HEAD FINDINGS  Anterior circulation: Atheromatous change about the carotid siphons with associated severe stenoses at the para clinoid segments bilaterally. A1 segments patent. Normal anterior communicating artery complex. Anterior cerebral arteries patent without stenosis. No M1 stenosis or occlusion. Distal MCA branches perfused and symmetric. Posterior circulation: Atheromatous change about the V4 segments bilaterally with severe stenosis on the right and moderate stenosis on the left. Both PICA patent. Basilar patent without stenosis. Superior cerebral arteries patent bilaterally. Both PCAs supplied via the basilar well perfused or distal aspects without stenosis. Venous sinuses: Grossly patent allowing for timing the contrast bolus. Anatomic variants: None significant.  No aneurysm. Review of the MIP images confirms the above findings IMPRESSION: 1. Atheromatous change about the proximal left V1 segment with associated intimal irregularity and small amount of intraluminal adherent thrombus. Findings could  be related to a recently ruptured plaque or possibly short segment dissection. 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 No visible downstream embolic occlusion. 2. Additional atheromatous change about the proximal right V1 segment and bilateral V4 segments with associated moderate to severe stenoses as above. 3. Carotid bifurcation atherosclerotic change with up to 55% narrowing on the right and 75% narrowing on the left. 4. Atheromatous change about the carotid siphons with associated severe stenoses at the para clinoid segments bilaterally. 5. Small focus of patchy ground-glass density within the partially visualized right lung, likely mild pneumonitis . Electronically Signed   By: Rise Mu M.D.   On: 12/16/2022 23:13   CT HEAD WO CONTRAST  Result Date: 12/16/2022 CLINICAL DATA:  Syncope/presyncope.  History of seizures. EXAM: CT HEAD WITHOUT CONTRAST TECHNIQUE: Contiguous axial images were  obtained from the base of the skull through the vertex without intravenous contrast. RADIATION DOSE REDUCTION: This exam was performed according to the departmental dose-optimization program which includes automated exposure control, adjustment of the mA and/or kV according to patient size and/or use of iterative reconstruction technique. COMPARISON:  10/20/2021 FINDINGS: Brain: The subdural hematoma along the falx has resolved. No mass lesion, hemorrhage, hydrocephalus, acute infarct, intra-axial, or extra-axial fluid collection. Vascular: No hyperdense vessel or unexpected calcification. Skull: No significant soft tissue swelling. Hyperostosis frontalis interna. Sinuses/Orbits: Remote fracture of the left medial orbital wall. Normal paranasal sinuses. Hypoplastic left mastoid air cells. Other: None. IMPRESSION: No acute intracranial abnormality. Electronically Signed   By: Jeronimo Greaves M.D.   On: 12/16/2022 11:56    EKG: Independently reviewed.   Assessment and Plan: * Acute ischemic stroke (HCC) Suspicion of subacute stroke. Stroke pathway See Neuro note MRI brain Wont be done until Monday due to PPM (PPM is suspected to be an MRI conditional model) Tele monitor 2d echo PT/OT/SLP Normotension per neuro ASA 81 + plavix for 75 days then ASA alone. Needs repeat CTA in 2 months  Developmental delay disorder Cont seroquel, klonopin  Essential hypertension Cont home norvasc and clonidine  Seizure disorder (HCC) Continue Lamictal and Klonopin      Advance Care Planning:   Code Status: Do not attempt resuscitation (DNR) PRE-ARREST INTERVENTIONS DESIRED Yellow form on file  Consults: Dr. Derry Lory.  Family Communication: Family at bedside  Severity of Illness: The appropriate patient status for this patient is OBSERVATION. Observation status is judged to be reasonable and necessary in order to provide the required intensity of service to ensure the patient's safety. The patient's  presenting symptoms, physical exam findings, and initial radiographic and laboratory data in the context of their medical condition is felt to place them at decreased risk for further clinical deterioration. Furthermore, it is anticipated that the patient will be medically stable for discharge from the hospital within 2 midnights of admission.   Author: Hillary Bow., DO 12/17/2022 4:22 AM  For on call review [email or web address].

## 2022-12-18 ENCOUNTER — Inpatient Hospital Stay (HOSPITAL_COMMUNITY): Payer: Medicare Other

## 2022-12-18 DIAGNOSIS — I6502 Occlusion and stenosis of left vertebral artery: Secondary | ICD-10-CM

## 2022-12-18 DIAGNOSIS — R531 Weakness: Secondary | ICD-10-CM | POA: Diagnosis not present

## 2022-12-18 DIAGNOSIS — I6509 Occlusion and stenosis of unspecified vertebral artery: Secondary | ICD-10-CM | POA: Insufficient documentation

## 2022-12-18 LAB — RAPID URINE DRUG SCREEN, HOSP PERFORMED
Amphetamines: NOT DETECTED
Barbiturates: NOT DETECTED
Benzodiazepines: POSITIVE — AB
Cocaine: NOT DETECTED
Opiates: NOT DETECTED
Tetrahydrocannabinol: NOT DETECTED

## 2022-12-18 MED ORDER — ASPIRIN 81 MG PO TBEC: 81.0000 mg | DELAYED_RELEASE_TABLET | Freq: Every day | ORAL | 0 refills | Status: AC

## 2022-12-18 MED ORDER — PROSIGHT PO TABS
1.0000 | ORAL_TABLET | Freq: Every day | ORAL | Status: DC
  Administered 2022-12-18: 1 via ORAL
  Filled 2022-12-18: qty 1

## 2022-12-18 MED ORDER — CLOPIDOGREL BISULFATE 75 MG PO TABS: 75.0000 mg | ORAL_TABLET | Freq: Every day | ORAL | 0 refills | Status: AC

## 2022-12-18 MED ORDER — ROSUVASTATIN CALCIUM 20 MG PO TABS: 20.0000 mg | ORAL_TABLET | Freq: Every day | ORAL | 0 refills | Status: DC

## 2022-12-18 NOTE — Discharge Summary (Signed)
Physician Discharge Summary   [name] MRN: [id number] DOB: 10/08/1960  Admit date:     12/16/2022  Discharge date: 12/18/22  Discharge Physician: Jacquelin Hawking, MD   PCP: Billee Cashing, MD   Recommendations at discharge:  PCP visit for hospital follow-up Neurology visit for hospital follow-up Rolling walker. Staff may use gait belt  Discharge Diagnoses: Principal Problem:   Left-sided weakness Active Problems:   Seizure disorder (HCC)   Essential hypertension   Developmental delay disorder   Vertebral artery stenosis  Resolved Problems:   * No resolved hospital problems. *  Hospital Course: AREND HARWELL is a 62 y.o. male with a history of sick sinus syndrome s/p PPM, developmental delay, MGUS, primary hypertension, seizures.  Patient presented secondary to left-sided weakness leftward leaning for over two weeks with increased falls concerning for possible stroke. Neurology consulted. MRI significant for no evidence of stroke. CTA head and neck was significant for vertebral artery stenosis. Recommendation for aspirin, Plavix and Crestor on discharge.  Assessment and Plan:  CT head without evidence of stroke. CTA head and neck significant for possible recently ruptured blaque vs short segment dissection at left V1 segment in addition to carotid bifurcation atherosclerotic change with up to 55% narroing on right and 75% narrowing on left. LDL of 118. Hemoglobin A1C of 4.9%. Transthoracic Echocardiogram significant for normal LVEF of 55-60% with poor visualization of the interatrial septum. Neurology recommendation for aspirin, Plavix x3 months and Crestor. Recommendation to discontinue amlodipine secondary to vertebral artery stenosis. PT/OT recommendation for outpatient physical and occupational therapy.  Hyperlipidemia LDL of 118. Patient started on Crestor. Continue Crestor 20 mg daily on discharge.   Primary hypertension Patient was on amlodipine and  clonidine patch prior to admission. Amlodipine discontinued on discharge.   Developmental delay disorder Noted. Continue Seroquel   Seizure disorder Continue Lamictal and Klonopin    Consultants:  Neurology  Procedures performed:  Transthoracic Echocardiogram   Disposition: Group home Diet recommendation:  Dysphagia 3 (Mechanical soft);Thin liquid   Liquid Administration via: Cup;Straw;Other (Comment) (provale cup) Medication Administration: Whole meds with puree Supervision: Staff to assist with self feeding;Full supervision/cueing for compensatory strategies Compensations: Minimize environmental distractions;Slow rate;Small sips/bites Postural Changes: Seated upright at 90 degrees     DISCHARGE MEDICATION: Allergies as of 12/18/2022       Reactions   Dilantin [phenytoin]    Unknown reaction   Keppra [levetiracetam]    Unknown reaction   Lisinopril    Unknown reaction        Medication List     STOP taking these medications    amLODipine 10 MG tablet Commonly known as: NORVASC       TAKE these medications    aspirin EC 81 MG tablet Take 1 tablet (81 mg total) by mouth daily. Swallow whole. Start taking on: December 19, 2022   clonazePAM 0.5 MG disintegrating tablet Commonly known as: KLONOPIN Take 0.5 mg by mouth 3 (three) times daily.   cloNIDine 0.2 mg/24hr patch Commonly known as: CATAPRES - Dosed in mg/24 hr Place 0.2 mg onto the skin once a week. Thursday   clopidogrel 75 MG tablet Commonly known as: PLAVIX Take 1 tablet (75 mg total) by mouth daily. Take for 88 days, then discontinue Start taking on: December 19, 2022   ferrous sulfate 325 (65 FE) MG tablet Take 325 mg by mouth daily.   Fish Oil 1000 MG Caps Take 2,000 mg by mouth daily.   lamoTRIgine 25 MG  tablet Commonly known as: LAMICTAL Take 50-75 mg by mouth See admin instructions. Take in addition to 100 mg tablet for a total morning dose of 150mg  and bedtime dose of 175mg     lamoTRIgine 100 MG tablet Commonly known as: LAMICTAL Take 100 mg by mouth 2 (two) times daily.   loratadine 10 MG tablet Commonly known as: CLARITIN Take 10 mg by mouth daily.   melatonin 3 MG Tabs tablet Take 3 mg by mouth every evening.   polyethylene glycol 17 g packet Commonly known as: MIRALAX / GLYCOLAX Take 17 g by mouth in the morning, at noon, and at bedtime.   PreserVision AREDS 2 Caps Take 1 capsule by mouth 2 (two) times daily.   QUEtiapine 25 MG tablet Commonly known as: SEROQUEL Take 25 mg by mouth daily as needed (severe agitation).   QUEtiapine 100 MG tablet Commonly known as: SEROQUEL Take 100-150 mg by mouth See admin instructions. Take 1 tablet by mouth twice a day then 1.5 tablets at bedtime   rosuvastatin 20 MG tablet Commonly known as: CRESTOR Take 1 tablet (20 mg total) by mouth daily. Start taking on: December 19, 2022   senna 8.6 MG Tabs tablet Commonly known as: SENOKOT Take 2 tablets by mouth at bedtime.               Durable Medical Equipment  (From admission, onward)           Start     Ordered   12/18/22 1454  For home use only DME Other see comment  Once       Comments: Gait belt  Question:  Length of Need  Answer:  Lifetime   12/18/22 1454   12/18/22 1433  For home use only DME Walker rolling  Once       Question Answer Comment  Walker: With 5 Inch Wheels   Patient needs a walker to treat with the following condition Weakness      12/18/22 1432            Follow-up Information     Lamba, Domenic Schwab, MD. Schedule an appointment as soon as possible for a visit in 1 week(s).   Specialty: Internal Medicine Contact information: [address] [facility] [address] [facility] [place] [address] [phone]         [facility]. Schedule an appointment as soon as possible for a visit in 4 week(s).   Specialty: Neurology Why: For hospital follow-up Contact  information: [address] [place] [address] [phone]        [facility]. Schedule an appointment as soon as possible for a visit.   Specialty: Rehabilitation Contact information: [address] [place] [address] [phone]               Discharge Exam: BP 127/80 (BP Location: Left Arm)   Pulse 62   Temp 98.4 F (36.9 C) (Oral)   Resp 15   Ht 5\' 11"  (1.803 m)   Wt 83 kg   SpO2 96%   BMI 25.52 kg/m   General exam: Appears calm and comfortable Respiratory system: Clear to auscultation. Respiratory effort normal. Cardiovascular system: S1 & S2 heard, RRR. Gastrointestinal system: Abdomen is nondistended, soft and nontender. Normal bowel sounds heard. Central nervous system: Alert. Musculoskeletal: No edema. No calf tenderness  Condition at discharge: stable  The results of significant diagnostics from this hospitalization (including  imaging, microbiology, ancillary and laboratory) are listed below for reference.   Imaging Studies: MR BRAIN WO CONTRAST  Result Date: 12/18/2022 CLINICAL DATA:  Neuro deficit, acute, stroke suspected. EXAM: MRI HEAD WITHOUT CONTRAST TECHNIQUE: Multiplanar, multiecho pulse sequences of the brain and surrounding structures were obtained without intravenous contrast. COMPARISON:  Head CT December 16, 2022. FINDINGS: Brain: Scattered foci no acute infarction, hemorrhage, hydrocephalus, extra-axial collection or mass lesion. Scattered foci T2 hyperintensity are seen within the white matter of the cerebral hemispheres, nonspecific. Mild parenchymal volume loss. Vascular: Normal flow voids. Skull and upper cervical spine: Normal marrow signal. Sinuses/Orbits: Chronic his sense of the left lamina papyracea. Other: None. IMPRESSION: 1. No acute intracranial abnormality. 2. Scattered foci of T2 hyperintensity within the white matter of the cerebral  hemispheres, nonspecific but may represent mild chronic microvascular ischemic changes. 3. Mild parenchymal volume loss. Electronically Signed   By: Baldemar Lenis M.D.   On: 12/18/2022 14:12   ECHOCARDIOGRAM COMPLETE  Result Date: 12/17/2022    ECHOCARDIOGRAM REPORT   Patient Name:   LARYAN BOWERSOCK Date of Exam: 12/17/2022 Medical Rec #:  [id number]       Height:       71.0 in Accession #:    [id number]      Weight:       183.0 lb Date of Birth:  December 22, 1960       BSA:          2.030 m Patient Age:    62 years        BP:           118/77 mmHg Patient Gender: M               HR:           68 bpm. Exam Location:  Inpatient Procedure: 2D Echo, Cardiac Doppler and Color Doppler Indications:    Stroke  History:        Patient has no prior history of Echocardiogram examinations.                 Risk Factors:Hypertension.  Sonographer:    Darlys Gales Referring Phys: [phone] JARED M GARDNER IMPRESSIONS  1. Left ventricular ejection fraction, by estimation, is 55 to 60%. Left ventricular ejection fraction by PLAX is 58 %. The left ventricle has normal function. The left ventricle has no regional wall motion abnormalities. There is mild left ventricular hypertrophy. Left ventricular diastolic parameters are consistent with Grade I diastolic dysfunction (impaired relaxation).  2. Right ventricular systolic function is normal. The right ventricular size is normal.  3. The mitral valve is grossly normal. Trivial mitral valve regurgitation.  4. The aortic valve was not well visualized. Aortic valve regurgitation is not visualized. Comparison(s): No prior Echocardiogram. FINDINGS  Left Ventricle: Left ventricular ejection fraction, by estimation, is 55 to 60%. Left ventricular ejection fraction by PLAX is 58 %. The left ventricle has normal function. The left ventricle has no regional wall motion abnormalities. The left ventricular internal cavity size was normal in size. There is mild left ventricular  hypertrophy. Left ventricular diastolic parameters are consistent with Grade I diastolic dysfunction (impaired relaxation). Indeterminate filling pressures. Right Ventricle: The right ventricular size is normal. No increase in right ventricular wall thickness. Right ventricular systolic function is normal. Left Atrium: Left atrial size was normal in size. Right Atrium: Right atrial size was normal in size. Pericardium: There is no evidence of pericardial effusion. Mitral Valve: The mitral valve  is grossly normal. Trivial mitral valve regurgitation. Tricuspid Valve: The tricuspid valve is grossly normal. Tricuspid valve regurgitation is mild. Aortic Valve: The aortic valve was not well visualized. Aortic valve regurgitation is not visualized. Aortic valve mean gradient measures 3.0 mmHg. Aortic valve peak gradient measures 5.4 mmHg. Aortic valve area, by VTI measures 2.83 cm. Pulmonic Valve: The pulmonic valve was not well visualized. Pulmonic valve regurgitation is not visualized. Aorta: The aortic root and ascending aorta are structurally normal, with no evidence of dilitation. IAS/Shunts: The interatrial septum was not well visualized.  LEFT VENTRICLE PLAX 2D LV EF:         Left            Diastology                ventricular     LV e' medial:    4.24 cm/s                ejection        LV E/e' medial:  15.4                fraction by     LV e' lateral:   5.22 cm/s                PLAX is 58      LV E/e' lateral: 12.5                %. LVIDd:         3.70 cm LVIDs:         2.60 cm LV PW:         1.10 cm LV IVS:        0.90 cm LVOT diam:     2.00 cm LV SV:         60 LV SV Index:   30 LVOT Area:     3.14 cm  RIGHT VENTRICLE RV S prime:     11.40 cm/s TAPSE (M-mode): 1.5 cm LEFT ATRIUM             Index        RIGHT ATRIUM           Index LA Vol (A2C):   39.5 ml 19.45 ml/m  RA Area:     10.10 cm LA Vol (A4C):   58.2 ml 28.66 ml/m  RA Volume:   20.80 ml  10.24 ml/m LA Biplane Vol: 48.1 ml 23.69 ml/m  AORTIC  VALVE AV Area (Vmax):    2.58 cm AV Area (Vmean):   2.69 cm AV Area (VTI):     2.83 cm AV Vmax:           116.00 cm/s AV Vmean:          76.900 cm/s AV VTI:            0.213 m AV Peak Grad:      5.4 mmHg AV Mean Grad:      3.0 mmHg LVOT Vmax:         95.40 cm/s LVOT Vmean:        65.900 cm/s LVOT VTI:          0.192 m LVOT/AV VTI ratio: 0.90  AORTA Ao Root diam: 2.70 cm Ao Asc diam:  3.00 cm MITRAL VALVE               TRICUSPID VALVE MV Area (PHT): 2.06 cm    TR Peak grad:   20.2 mmHg MV  Decel Time: 368 msec    TR Vmax:        225.00 cm/s MV E velocity: 65.40 cm/s MV A velocity: 88.50 cm/s  SHUNTS MV E/A ratio:  0.74        Systemic VTI:  0.19 m                            Systemic Diam: 2.00 cm Zoila Shutter MD Electronically signed by Zoila Shutter MD Signature Date/Time: 12/17/2022/12:19:15 PM    Final    CT ANGIO HEAD NECK W WO CM  Result Date: 12/16/2022 CLINICAL DATA:  Initial evaluation for neuro deficit, stroke. EXAM: CT ANGIOGRAPHY HEAD AND NECK WITH AND WITHOUT CONTRAST TECHNIQUE: Multidetector CT imaging of the head and neck was performed using the standard protocol during bolus administration of intravenous contrast. Multiplanar CT image reconstructions and MIPs were obtained to evaluate the vascular anatomy. Carotid stenosis measurements (when applicable) are obtained utilizing NASCET criteria, using the distal internal carotid diameter as the denominator. RADIATION DOSE REDUCTION: This exam was performed according to the departmental dose-optimization program which includes automated exposure control, adjustment of the mA and/or kV according to patient size and/or use of iterative reconstruction technique. CONTRAST:  75mL OMNIPAQUE IOHEXOL 350 MG/ML SOLN COMPARISON:  CT from earlier the same day. FINDINGS: CTA NECK FINDINGS Aortic arch: Visualized arch within normal limits for caliber. Bovine branching pattern noted. No stenosis about the origin of the great vessels. Right carotid system: Right  common and internal carotid arteries are patent without dissection. Atheromatous change about the proximal cervical right ICA with associated stenosis of up to 55% by NASCET criteria. Left carotid system: Left common and internal carotid arteries are patent without dissection. Bulky calcified plaque about the proximal cervical left ICA with associated severe stenosis of 75% by NASCET criteria. Vertebral arteries: Both vertebral arteries arise from subclavian arteries. No proximal subclavian artery stenosis. Proximal right vertebral artery not well seen due to adjacent venous contamination. Atheromatous plaque about the proximal right vertebral artery with up to moderate to severe proximal right V1 stenosis (series 7, image 263). Right vertebral artery otherwise patent distally without significant stenosis or dissection. Atheromatous change with intimal irregularity present about the proximal left V1 segment is seen. Small amount of intraluminal adherent thrombus seen at this location (series 7, image 261). Findings could be related to a recently ruptured plaque or possibly short segment dissection. Up to severe stenosis at the left V1/V2 junction (series 7, image 256). Left vertebral artery otherwise patent distally without significant stenosis. Skeleton: No discrete or worrisome osseous lesions. Bulky anterior osteophytic spurring noted throughout the cervical spine. Other neck: No other acute finding. Upper chest: Small focus of patchy ground-glass density within the partially visualized right lung, likely mild pneumonitis (series 5, image 170). Scattered atelectatic changes noted elsewhere within the visualized lungs. Review of the MIP images confirms the above findings CTA HEAD FINDINGS Anterior circulation: Atheromatous change about the carotid siphons with associated severe stenoses at the para clinoid segments bilaterally. A1 segments patent. Normal anterior communicating artery complex. Anterior cerebral  arteries patent without stenosis. No M1 stenosis or occlusion. Distal MCA branches perfused and symmetric. Posterior circulation: Atheromatous change about the V4 segments bilaterally with severe stenosis on the right and moderate stenosis on the left. Both PICA patent. Basilar patent without stenosis. Superior cerebral arteries patent bilaterally. Both PCAs supplied via the basilar well perfused or distal aspects without stenosis. Venous sinuses: Grossly  patent allowing for timing the contrast bolus. Anatomic variants: None significant.  No aneurysm. Review of the MIP images confirms the above findings IMPRESSION: 1. Atheromatous change about the proximal left V1 segment with associated intimal irregularity and small amount of intraluminal adherent thrombus. Findings could be related to a recently ruptured plaque or possibly short segment dissection. 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 No visible downstream embolic occlusion. 2. Additional atheromatous change about the proximal right V1 segment and bilateral V4 segments with associated moderate to severe stenoses as above. 3. Carotid bifurcation atherosclerotic change with up to 55% narrowing on the right and 75% narrowing on the left. 4. Atheromatous change about the carotid siphons with associated severe stenoses at the para clinoid segments bilaterally. 5. Small focus of patchy ground-glass density within the partially visualized right lung, likely mild pneumonitis . Electronically Signed   By: Rise Mu M.D.   On: 12/16/2022 23:13   CT HEAD WO CONTRAST  Result Date: 12/16/2022 CLINICAL DATA:  Syncope/presyncope.  History of seizures. EXAM: CT HEAD WITHOUT CONTRAST TECHNIQUE: Contiguous axial images were obtained from the base of the skull through the vertex without intravenous contrast. RADIATION DOSE REDUCTION: This exam was performed according to the departmental dose-optimization program which includes automated exposure control,  adjustment of the mA and/or kV according to patient size and/or use of iterative reconstruction technique. COMPARISON:  10/20/2021 FINDINGS: Brain: The subdural hematoma along the falx has resolved. No mass lesion, hemorrhage, hydrocephalus, acute infarct, intra-axial, or extra-axial fluid collection. Vascular: No hyperdense vessel or unexpected calcification. Skull: No significant soft tissue swelling. Hyperostosis frontalis interna. Sinuses/Orbits: Remote fracture of the left medial orbital wall. Normal paranasal sinuses. Hypoplastic left mastoid air cells. Other: None. IMPRESSION: No acute intracranial abnormality. Electronically Signed   By: Jeronimo Greaves M.D.   On: 12/16/2022 11:56    Microbiology: Results for orders placed or performed during the hospital encounter of 12/19/18  Blood Culture (routine x 2)     Status: None   Collection Time: 12/20/18 12:12 AM   Specimen: BLOOD  Result Value Ref Range Status   Specimen Description BLOOD BLOOD RIGHT HAND  Final   Special Requests   Final    BOTTLES DRAWN AEROBIC AND ANAEROBIC Blood Culture results may not be optimal due to an excessive volume of blood received in culture bottles   Culture   Final    NO GROWTH 5 DAYS Performed at [facility], [address], [place], [place] [address]    Report Status 12/25/2018 FINAL  Final  SARS CORONAVIRUS 2 (TAT 6-24 HRS) Nasopharyngeal Nasopharyngeal Swab     Status: Abnormal   Collection Time: 12/20/18 12:16 AM   Specimen: Nasopharyngeal Swab  Result Value Ref Range Status   SARS Coronavirus 2 POSITIVE (A) NEGATIVE Final    Comment: RESULT CALLED TO, READ BACK BY AND VERIFIED WITH: L. GANNON, RN [facility]) AT 1410 ON 12/20/18 BY C. JESSUP, MT. (NOTE) SARS-CoV-2 target nucleic acids are DETECTED. The SARS-CoV-2 RNA is generally detectable in upper and lower respiratory specimens during the acute phase of infection. Positive results are indicative of active infection with SARS-CoV-2. Clinical   correlation with patient history and other diagnostic information is necessary to determine patient infection status. Positive results do  not rule out bacterial infection or co-infection with other viruses. The expected result is Negative. Fact Sheet for Patients: HairSlick.no Fact Sheet for Healthcare Providers: quierodirigir.com This test is not yet approved or cleared by the [place]  FDA and  has been authorized for detection and/or diagnosis of SARS-CoV-2 by FDA under an Emergency Use Authorization (EUA). This EUA will remain  in effect (meaning this  test can be used) for the duration of the COVID-19 declaration under Section 564(b)(1) of the Act, 21 U.S.C. section 360bbb-3(b)(1), unless the authorization is terminated or revoked sooner. Performed at [facility], [address], [place], [place] [address]   Blood Culture (routine x 2)     Status: None   Collection Time: 12/20/18 12:30 AM   Specimen: BLOOD  Result Value Ref Range Status   Specimen Description BLOOD RIGHT ANTECUBITAL  Final   Special Requests   Final    BOTTLES DRAWN AEROBIC AND ANAEROBIC Blood Culture results may not be optimal due to an excessive volume of blood received in culture bottles   Culture   Final    NO GROWTH 5 DAYS Performed at [facility], [address] [place], [place] [address]    Report Status 12/25/2018 FINAL  Final  Urine culture     Status: None   Collection Time: 12/20/18  3:01 AM   Specimen: In/Out Cath Urine  Result Value Ref Range Status   Specimen Description   Final    IN/OUT CATH URINE Performed at [facility], [address], [place], [place] [address]    Special Requests   Final    NONE Performed at [facility], [address], [place], [place] [address]    Culture   Final    NO GROWTH Performed at [facility], [address], [place], [place] [address]    Report  Status 12/21/2018 FINAL  Final    Labs: CBC: Recent Labs  Lab 12/16/22 1025  WBC 5.6  NEUTROABS 4.5  HGB 11.1*  HCT 32.8*  MCV 98.5  PLT 189   Basic Metabolic Panel: Recent Labs  Lab 12/16/22 1025  NA 140  K 4.3  CL 104  CO2 26  GLUCOSE 117*  BUN 24*  CREATININE 1.65*  CALCIUM 9.0  MG 2.2   Liver Function Tests: Recent Labs  Lab 12/16/22 1025  AST 22  ALT 12  ALKPHOS 99  BILITOT 0.4  PROT 7.2  ALBUMIN 3.9    Discharge time spent: 35 minutes.  Signed: Jacquelin Hawking, MD [facility] 12/18/2022

## 2022-12-18 NOTE — Progress Notes (Signed)
Pt has Environmental education officer.  Okay per Alejandro Mulling, PA in Cardiology.  MRI mode enabled by [facility] via [employer]. SWOT RN monitored pt during exam.

## 2022-12-18 NOTE — [place] FL2 (Addendum)
[place] MEDICAID FL2 LEVEL OF CARE FORM     IDENTIFICATION  [name] Birthdate: 08/29/1960 Sex: male Admission Date (Current Location): 12/16/2022  [facility] and [place] Number:  Chiropodist and Address:  [facility], [address], [place], [place] [address]      Provider Number: [id number]  Attending Physician Name and Address:  Narda Bonds, MD  Relative Name and Phone Number:  Kaspen, Murk [phone]  [phone]    Current Level of Care: Hospital Recommended Level of Care: Other (Comment) (Group Home) Prior Approval Number:    Date Approved/Denied:   PASRR Number:    Discharge Plan: Other (Comment) (Group Home)    Current Diagnoses: Patient Active Problem List   Diagnosis Date Noted   Vertebral artery stenosis 12/18/2022   Left-sided weakness 12/17/2022   MGUS (monoclonal gammopathy of unknown significance) 07/07/2021   Multifocal pneumonia 12/20/2018   Seizure disorder (HCC) 12/20/2018   COVID-19 virus infection 12/20/2018   Acute respiratory failure with hypoxia (HCC) 12/20/2018   Essential hypertension 12/20/2018   Hyponatremia 12/20/2018   Pancytopenia (HCC) 12/20/2018   Developmental delay disorder 12/20/2018   Pneumonia due to COVID-19 virus 12/20/2018   CKD (chronic kidney disease) stage 3, GFR 30-59 ml/min (HCC) 12/20/2018   Age-related nuclear cataract of both eyes 07/18/2017   Early dry stage nonexudative age-related macular degeneration of both eyes 07/18/2017   Myopia with astigmatism and presbyopia, bilateral 07/18/2017   Onychomycosis 06/29/2016   Risk for falls 02/10/2015   Pacemaker 11/06/2014   Septic shock (HCC) 11/06/2014   Dysarthria 10/31/2014   Altered mental status 10/30/2014   Thrombocytopenia (HCC) 10/08/2014   Medication monitoring encounter 04/24/2014   Tremor 04/24/2014   Unsteadiness on feet 04/24/2014   History of nonmelanoma skin cancer 07/22/2013    Colon polyps 07/09/2013   Enuresis, primary, functional 05/09/2013   Gait disturbance 04/17/2013   Hemorrhoid 04/17/2013   Intention tremor 04/17/2013   Actinic keratosis 05/27/2010   Major depressive disorder, single episode, severe, with psychotic behavior (HCC) 03/21/2010   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   Pervasive developmental disorder 03/21/2010   Obsessive-compulsive disorder 03/07/2010   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   Obesity due to excess calories 02/25/2010    Orientation RESPIRATION BLADDER Height & Weight     Self, Situation, Time, Place  Normal Incontinent Weight: 83 kg Height:  5\' 11"  (180.3 cm)  BEHAVIORAL SYMPTOMS/MOOD NEUROLOGICAL BOWEL NUTRITION STATUS      Incontinent Mechanical soft with thin liquids  AMBULATORY STATUS COMMUNICATION OF NEEDS Skin   Limited Assist Verbally  (Abrasions to bilateral legs/ bruising to head/ redness to buttocks)                       Personal Care Assistance Level of Assistance  Bathing, Dressing, Feeding Bathing Assistance: Limited assistance Feeding assistance: Limited assistance Dressing Assistance: Limited assistance     Functional Limitations Info  Sight, Hearing, Speech Sight Info: Impaired Hearing Info: Adequate Speech Info: Adequate    SPECIAL CARE FACTORS FREQUENCY   (Gait Belt for ambulation)                    Contractures Contractures Info: Not present    Additional Factors Info  Code Status, Allergies, Psychotropic Code Status Info: DNR Allergies Info: dilantin/ keppra/ lisinopril Psychotropic Info: standard PRN's         Current Medications (12/18/2022):  This is the current hospital active  medication list Current Facility-Administered Medications  Medication Dose Route Frequency Provider Last Rate Last Admin   acetaminophen (TYLENOL) tablet 650 mg  650 mg Oral Q4H PRN Hillary Bow, DO       Or   acetaminophen (TYLENOL) 160 MG/5ML solution 650 mg  650 mg Per Tube Q4H  PRN Hillary Bow, DO       Or   acetaminophen (TYLENOL) suppository 650 mg  650 mg Rectal Q4H PRN Hillary Bow, DO       aspirin EC tablet 81 mg  81 mg Oral Daily Lyda Perone M, DO   81 mg at 12/18/22 1020   clonazePAM (KLONOPIN) disintegrating tablet 0.5 mg  0.5 mg Oral TID Hillary Bow, DO   0.5 mg at 12/18/22 1020   [START ON 12/21/2022] cloNIDine (CATAPRES - Dosed in mg/24 hr) patch 0.2 mg  0.2 mg Transdermal Q Thu Gardner, Jared M, DO       clopidogrel (PLAVIX) tablet 75 mg  75 mg Oral Daily Lyda Perone M, DO   75 mg at 12/18/22 1020   enoxaparin (LOVENOX) injection 40 mg  40 mg Subcutaneous Q24H Lyda Perone M, DO   40 mg at 12/18/22 1025   ferrous sulfate tablet 325 mg  325 mg Oral Daily Lyda Perone M, DO   325 mg at 12/18/22 1021   lamoTRIgine (LAMICTAL) tablet 150 mg  150 mg Oral Daily Lyda Perone M, DO   150 mg at 12/18/22 1020   lamoTRIgine (LAMICTAL) tablet 175 mg  175 mg Oral QHS Hillary Bow, DO   175 mg at 12/17/22 2133   loratadine (CLARITIN) tablet 10 mg  10 mg Oral Daily Lyda Perone M, DO   10 mg at 12/18/22 1024   melatonin tablet 3 mg  3 mg Oral QPM Hillary Bow, DO   3 mg at 12/17/22 1735   multivitamin (PROSIGHT) tablet 1 tablet  1 tablet Oral Daily Narda Bonds, MD   1 tablet at 12/18/22 1020   omega-3 acid ethyl esters (LOVAZA) capsule 2,000 mg  2,000 mg Oral Daily Lyda Perone M, DO   2,000 mg at 12/18/22 1020   polyethylene glycol (MIRALAX / GLYCOLAX) packet 17 g  17 g Oral TID Hillary Bow, DO   17 g at 12/17/22 2134   QUEtiapine (SEROQUEL) tablet 100 mg  100 mg Oral BID Hillary Bow, DO   100 mg at 12/18/22 1313   QUEtiapine (SEROQUEL) tablet 150 mg  150 mg Oral QHS Hillary Bow, DO   150 mg at 12/17/22 2133   QUEtiapine (SEROQUEL) tablet 25 mg  25 mg Oral Daily PRN Hillary Bow, DO       rosuvastatin (CRESTOR) tablet 20 mg  20 mg Oral Daily Peterson Ao, MD   20 mg at 12/18/22 1020   senna (SENOKOT) tablet  17.2 mg  2 tablet Oral QHS Lyda Perone M, DO   17.2 mg at 12/17/22 2133     Discharge Medications: aspirin EC 81 MG tablet Take 1 tablet (81 mg total) by mouth daily. Swallow whole. Start taking on: December 19, 2022    clonazePAM 0.5 MG disintegrating tablet Commonly known as: KLONOPIN Take 0.5 mg by mouth 3 (three) times daily.    cloNIDine 0.2 mg/24hr patch Commonly known as: CATAPRES - Dosed in mg/24 hr Place 0.2 mg onto the skin once a week. Thursday    clopidogrel 75 MG tablet Commonly known as: PLAVIX Take 1  tablet (75 mg total) by mouth daily. Take for 88 days, then discontinue Start taking on: December 19, 2022    ferrous sulfate 325 (65 FE) MG tablet Take 325 mg by mouth daily.    Fish Oil 1000 MG Caps Take 2,000 mg by mouth daily.    lamoTRIgine 25 MG tablet Commonly known as: LAMICTAL Take 50-75 mg by mouth See admin instructions. Take in addition to 100 mg tablet for a total morning dose of 150mg  and bedtime dose of 175mg     lamoTRIgine 100 MG tablet Commonly known as: LAMICTAL Take 100 mg by mouth 2 (two) times daily.    loratadine 10 MG tablet Commonly known as: CLARITIN Take 10 mg by mouth daily.    melatonin 3 MG Tabs tablet Take 3 mg by mouth every evening.    polyethylene glycol 17 g packet Commonly known as: MIRALAX / GLYCOLAX Take 17 g by mouth in the morning, at noon, and at bedtime.    PreserVision AREDS 2 Caps Take 1 capsule by mouth 2 (two) times daily.    QUEtiapine 25 MG tablet Commonly known as: SEROQUEL Take 25 mg by mouth daily as needed (severe agitation).    QUEtiapine 100 MG tablet Commonly known as: SEROQUEL Take 100-150 mg by mouth See admin instructions. Take 1 tablet by mouth twice a day then 1.5 tablets at bedtime    rosuvastatin 20 MG tablet Commonly known as: CRESTOR Take 1 tablet (20 mg total) by mouth daily. Start taking on: December 19, 2022    senna 8.6 MG Tabs tablet Commonly known as: SENOKOT Take 2  tablets by mouth at bedtime.    Relevant Imaging Results:  Relevant Lab Results:   Additional Information Needs outpatient PT/OT  Kermit Balo, RN

## 2022-12-18 NOTE — Hospital Course (Addendum)
Frank Conley is a 62 y.o. male with a history of sick sinus syndrome s/p PPM, developmental delay, MGUS, primary hypertension, seizures.  Patient presented secondary to left-sided weakness leftward leaning for over two weeks with increased falls concerning for possible stroke. Neurology consulted. MRI significant for no evidence of stroke. CTA head and neck was significant for vertebral artery stenosis. Recommendation for aspirin, Plavix and Crestor on discharge.

## 2022-12-18 NOTE — Progress Notes (Addendum)
STROKE TEAM PROGRESS NOTE   BRIEF HPI  Frank Conley is a 62 y.o. male with hx of developmental delay, SSS, MGUS, seizures, HTN who for years has had hx of falls and slight lean to the left. DDoes have periods where he is better and periods where he is worse but over the last 2 weeks, been consistently leaning to the left with increase in falls. Exam with mild L leg weakness despite patient being left handed per brother at baseline along with LLE ataxia. CTA with ?adherent thrombus vs dissection in L vertebral artery.    He was transferred to [facility] to get MRI brain w/o contrast but with his pacemaker, unable to get it over the weekend.   SIGNIFICANT HOSPITAL EVENT CTA with ?adherent thrombus vs dissection in L vertebral artery. Moderate to severe stenosis in proximal R V1 and b/l V4 sgements Atherosclerotic change  up 55% on R and 75% of Left of carotid bifurcation.   INTERIM HISTORY/SUBJECTIVE Patient on his way to MRI this morning. No acute events overnight. Discussed following up imaging later this afternoon. Discussed with brother, PT/OT evaluations would determine placement at time of discharge. Pt lives in a group home which has a Engineer, civil (consulting) and physical therapist of site.   OBJECTIVE  CBC    Component Value Date/Time   WBC 5.6 12/16/2022 1025   RBC 3.33 (L) 12/16/2022 1025   HGB 11.1 (L) 12/16/2022 1025   HGB 12.0 (L) 10/24/2013 1325   HCT 32.8 (L) 12/16/2022 1025   HCT 34.3 (L) 10/24/2013 1325   PLT 189 12/16/2022 1025   PLT 118 (L) 10/24/2013 1325   MCV 98.5 12/16/2022 1025   MCV 97 10/24/2013 1325   MCH 33.3 12/16/2022 1025   MCHC 33.8 12/16/2022 1025   RDW 14.7 12/16/2022 1025   RDW 14.4 10/24/2013 1325   LYMPHSABS 0.6 (L) 12/16/2022 1025   MONOABS 0.3 12/16/2022 1025   EOSABS 0.0 12/16/2022 1025   BASOSABS 0.0 12/16/2022 1025    BMET    Component Value Date/Time   NA 140 12/16/2022 1025   NA 138 10/25/2013 0412   K 4.3 12/16/2022 1025   K 3.9 10/25/2013 0412   CL  104 12/16/2022 1025   CL 102 10/25/2013 0412   CO2 26 12/16/2022 1025   CO2 26 10/25/2013 0412   GLUCOSE 117 (H) 12/16/2022 1025   GLUCOSE 72 10/25/2013 0412   BUN 24 (H) 12/16/2022 1025   BUN 17 10/25/2013 0412   CREATININE 1.65 (H) 12/16/2022 1025   CREATININE 0.63 10/25/2013 0412   CALCIUM 9.0 12/16/2022 1025   CALCIUM 8.6 10/25/2013 0412   GFRNONAA 47 (L) 12/16/2022 1025   GFRNONAA >60 10/25/2013 0412    IMAGING past 24 hours ECHOCARDIOGRAM COMPLETE  Result Date: 12/17/2022    ECHOCARDIOGRAM REPORT   Patient Name:   Frank Conley Date of Exam: 12/17/2022 Medical Rec #:  [id number]       Height:       71.0 in Accession #:    [id number]      Weight:       183.0 lb Date of Birth:  12-Nov-1960       BSA:          2.030 m Patient Age:    62 years        BP:           118/77 mmHg Patient Gender: M  HR:           68 bpm. Exam Location:  Inpatient Procedure: 2D Echo, Cardiac Doppler and Color Doppler Indications:    Stroke  History:        Patient has no prior history of Echocardiogram examinations.                 Risk Factors:Hypertension.  Sonographer:    Darlys Gales Referring Phys: [phone] JARED M GARDNER IMPRESSIONS  1. Left ventricular ejection fraction, by estimation, is 55 to 60%. Left ventricular ejection fraction by PLAX is 58 %. The left ventricle has normal function. The left ventricle has no regional wall motion abnormalities. There is mild left ventricular hypertrophy. Left ventricular diastolic parameters are consistent with Grade I diastolic dysfunction (impaired relaxation).  2. Right ventricular systolic function is normal. The right ventricular size is normal.  3. The mitral valve is grossly normal. Trivial mitral valve regurgitation.  4. The aortic valve was not well visualized. Aortic valve regurgitation is not visualized. Comparison(s): No prior Echocardiogram. FINDINGS  Left Ventricle: Left ventricular ejection fraction, by estimation, is 55 to 60%. Left ventricular  ejection fraction by PLAX is 58 %. The left ventricle has normal function. The left ventricle has no regional wall motion abnormalities. The left ventricular internal cavity size was normal in size. There is mild left ventricular hypertrophy. Left ventricular diastolic parameters are consistent with Grade I diastolic dysfunction (impaired relaxation). Indeterminate filling pressures. Right Ventricle: The right ventricular size is normal. No increase in right ventricular wall thickness. Right ventricular systolic function is normal. Left Atrium: Left atrial size was normal in size. Right Atrium: Right atrial size was normal in size. Pericardium: There is no evidence of pericardial effusion. Mitral Valve: The mitral valve is grossly normal. Trivial mitral valve regurgitation. Tricuspid Valve: The tricuspid valve is grossly normal. Tricuspid valve regurgitation is mild. Aortic Valve: The aortic valve was not well visualized. Aortic valve regurgitation is not visualized. Aortic valve mean gradient measures 3.0 mmHg. Aortic valve peak gradient measures 5.4 mmHg. Aortic valve area, by VTI measures 2.83 cm. Pulmonic Valve: The pulmonic valve was not well visualized. Pulmonic valve regurgitation is not visualized. Aorta: The aortic root and ascending aorta are structurally normal, with no evidence of dilitation. IAS/Shunts: The interatrial septum was not well visualized.  LEFT VENTRICLE PLAX 2D LV EF:         Left            Diastology                ventricular     LV e' medial:    4.24 cm/s                ejection        LV E/e' medial:  15.4                fraction by     LV e' lateral:   5.22 cm/s                PLAX is 58      LV E/e' lateral: 12.5                %. LVIDd:         3.70 cm LVIDs:         2.60 cm LV PW:         1.10 cm LV IVS:        0.90 cm LVOT diam:  2.00 cm LV SV:         60 LV SV Index:   30 LVOT Area:     3.14 cm  RIGHT VENTRICLE RV S prime:     11.40 cm/s TAPSE (M-mode): 1.5 cm LEFT ATRIUM              Index        RIGHT ATRIUM           Index LA Vol (A2C):   39.5 ml 19.45 ml/m  RA Area:     10.10 cm LA Vol (A4C):   58.2 ml 28.66 ml/m  RA Volume:   20.80 ml  10.24 ml/m LA Biplane Vol: 48.1 ml 23.69 ml/m  AORTIC VALVE AV Area (Vmax):    2.58 cm AV Area (Vmean):   2.69 cm AV Area (VTI):     2.83 cm AV Vmax:           116.00 cm/s AV Vmean:          76.900 cm/s AV VTI:            0.213 m AV Peak Grad:      5.4 mmHg AV Mean Grad:      3.0 mmHg LVOT Vmax:         95.40 cm/s LVOT Vmean:        65.900 cm/s LVOT VTI:          0.192 m LVOT/AV VTI ratio: 0.90  AORTA Ao Root diam: 2.70 cm Ao Asc diam:  3.00 cm MITRAL VALVE               TRICUSPID VALVE MV Area (PHT): 2.06 cm    TR Peak grad:   20.2 mmHg MV Decel Time: 368 msec    TR Vmax:        225.00 cm/s MV E velocity: 65.40 cm/s MV A velocity: 88.50 cm/s  SHUNTS MV E/A ratio:  0.74        Systemic VTI:  0.19 m                            Systemic Diam: 2.00 cm Zoila Shutter MD Electronically signed by Zoila Shutter MD Signature Date/Time: 12/17/2022/12:19:15 PM    Final     Vitals:   12/17/22 1600 12/17/22 1952 12/18/22 0006 12/18/22 0347  BP: (!) 155/97 (!) 144/85 120/83 127/80  Pulse: 62 60 61 62  Resp: 16 16 16 15   Temp: 99.1 F (37.3 C) 98.4 F (36.9 C) 97.8 F (36.6 C) 98.4 F (36.9 C)  TempSrc: Oral Oral Oral Oral  SpO2: 97% 96% 97% 96%  Weight:      Height:         PHYSICAL EXAM General:  Alert, well-nourished, well-developed patient in no acute distress Psych:  Mood and affect appropriate for situation CV: Regular rate and rhythm on monitor Respiratory:  Regular, unlabored respirations on room air GI: Abdomen soft and nontender   NEURO:  Mental Status: AA&Oriented to person and setting. Mild developmental delay Speech/Language: speech is with moderate to severe dysarthria, patients states its his baseline.    Cranial Nerves:  II: PERRL. Visual fields full.  III, IV, VI: EOMI. Eyelids elevate symmetrically.  V:  Sensation is intact to light touch and symmetrical to face.  VII: Face is symmetrical resting and smiling VIII: hearing intact to voice. IX, X: Palate elevates symmetrically. Phonation is normal.  [id number] shrug 5/5. XII:  tongue is midline without fasciculations. Motor: 4/5 strength to all muscle groups tested.  Tone: is normal and bulk is low  Sensation- Intact to light touch bilaterally. Extinction absent to light touch to DSS.   Coordination: FTN intact bilaterally, HKS: no ataxia in BLE.No drift.  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 Moderate to severe stenosis in proximal R V1 and b/l V4 sgements Atherosclerotic change  up 55% on R and 75% of Left of carotid bifurcation.  MRI no acute infarct 2D Echo EF 55-60% Pacemaker interrogation no afib LDL 118 HgbA1c 4.9 UDS pending VTE prophylaxis - Lovenox  No antithrombotics PTA, now on Aspirin 81mg  and plavix 75mg  daily x 3 months, followed by Aspirin 81mg  daily alone given L VA stenosis.  Therapy recommendations:  outpt PT/OT  Disposition:  Group Home  Hypertension  Home medications: amlodipine 10 mg daily and clonidine patch  BP stable on the low end On clonidine patch Hold amlodipine, given BP at goal and to prevent limiting blood flow in setting of moderate to severe vessel disease   Hyperlipidemia Home meds:  None  LDL 118, goal < 70 Add Crestor 20 mg daily   Continue statin at discharge  Other Active Problems Developmental delay, continue home meds (lamictal and seroquel) Hx of seizure, continue home lamictal and clonazepam SSS s/p pacer MGUS  Hospital day # 1   ATTENDING NOTE: I reviewed above note and agree with the assessment and plan. Pt was seen and examined.   Brother at the bedside.  Patient doing well, no acute event overnight, neuro unchanged.  Mental MRI  today which showed no acute infarct.  Symptoms may related to TIA versus left VA insufficiency given left VA stenosis/atherosclerosis.  Continue DAPT for 3 months and then as alone.  Continue statin.  BP stable, not high, recommend to discontinue Norvasc.  Okay to continue clonidine patch.  PT and OT recommend outpatient therapy.  For detailed assessment and plan, please refer to above/below as I have made changes wherever appropriate.   Neurology will sign off. Please call with questions. Pt will follow up with stroke clinic NP at [facility] in about 4 weeks. Thanks for the consult.  Marvel Plan, MD PhD Stroke Neurology 12/18/2022 3:24 PM     To contact Stroke Continuity provider, please refer to [email or web address]. After hours, contact General Neurology

## 2022-12-18 NOTE — Progress Notes (Signed)
Wheeled off unit by [employer]. Gail belt taken by brother to car.

## 2022-12-18 NOTE — Care Management Obs Status (Signed)
MEDICARE OBSERVATION STATUS NOTIFICATION   Patient Details  Name: Frank Conley MRN: [id number] Date of Birth: 1960-04-23   Medicare Observation Status Notification Given:  Yes    Kermit Balo, RN 12/18/2022, 2:31 PM

## 2022-12-18 NOTE — TOC Transition Note (Signed)
Transition of Care [facility]) - CM/SW Discharge Note   Patient Details  Name: Frank Conley MRN: [id number] Date of Birth: 1960/03/12  [facility] CM/SW Contact:  Kermit Balo, RN Phone Number: 12/18/2022, 4:23 PM   Clinical Narrative:     Pt is discharging back to his group home. CM has faxed requested FL2 and d/c summary to the director, Lupita Leash at: [phone] Dan Humphreys has been delivered to the room per [facility]. Brother has gait belt requested the bedside for the facility. Pts brother is at the bedside and will provide transportation.    Final next level of care: OP Rehab Barriers to Discharge: No Barriers Identified   Patient Goals and CMS Choice   Choice offered to / list presented to : [facility] POA / Guardian  Discharge Placement                         Discharge Plan and Services Additional resources added to the After Visit Summary for                  DME Arranged: Walker rolling DME Agency: [employer] Date DME Agency Contacted: 12/18/22   Representative spoke with at DME Agency: Zack            Social Determinants of Health (SDOH) Interventions SDOH Screenings   Food Insecurity: No Food Insecurity (12/18/2022)  Housing: Low Risk  (12/18/2022)  Transportation Needs: No Transportation Needs (12/18/2022)  Utilities: Not At Risk (12/18/2022)  Financial Resource Strain: Low Risk  (05/01/2022)   Received from [facility]  Physical Activity: Inactive (12/21/2018)  Social Connections: Unknown (12/21/2018)  Stress: No Stress Concern Present (12/21/2018)  Tobacco Use: Low Risk  (12/17/2022)     Readmission Risk Interventions     No data to display

## 2022-12-18 NOTE — Progress Notes (Signed)
SLP Cancellation Note  Patient Details Name: RESTON HOCKER MRN: [id number] DOB: 1960-04-09   Cancelled treatment:       Reason Eval/Treat Not Completed: SLP screened. Per pt and his brother, speech and cognition are at baseline. No needs identified, will sign off.   Gwynneth Aliment, M.A., CF-SLP Speech Language Pathology, Acute Rehabilitation Services  Secure Chat preferred [phone]  12/18/2022, 3:36 PM

## 2022-12-18 NOTE — Progress Notes (Signed)
Physical Therapy Treatment Patient Details Name: Frank Conley MRN: [id number] DOB: [birth year] Today's Date: 12/18/2022   History of Present Illness Pt is a 62 y.o. male who presented 12/16/22 with x2 week hx of worsening L lean and multiple falls. CT head negative for acute intracranial abnormality. CT Angio head and neck demonstrated atheromatous change about the proximal left V1 segment with associated intimal irregularity and small amount of intraluminal adherent thrombus. Findings could be related to a recently ruptured plaque or possibly short segment dissection. 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 Awaiting brain MRI. PMH: developmental delay, SSS, MGUS, seizures, HTN, s/p PPM    PT Comments  Patient's brother present and agrees with pt using RW and staff using gait belt upon his return to group home. He requests this be made clear in the discharge paperwork.  Patient able to ambulate without scissoring today, however brother reports his abilities vary widely from day to day. Will err on the side of safety and continue to use RW at this time.    If plan is discharge home, recommend the following: A little help with walking and/or transfers;A little help with bathing/dressing/bathroom;Direct supervision/assist for medications management;Direct supervision/assist for financial management;Assist for transportation;Help with stairs or ramp for entrance;Assistance with cooking/housework;Supervision due to cognitive status   Can travel by private vehicle        Equipment Recommendations  Rolling walker (2 wheels)    Recommendations for Other Services       Precautions / Restrictions Precautions Precautions: Fall Restrictions Weight Bearing Restrictions: No     Mobility  Bed Mobility Overal bed mobility: Needs Assistance Bed Mobility: Supine to Sit, Sit to Supine     Supine to sit: HOB elevated, Supervision Sit to supine: Supervision   General bed mobility  comments: supervision for safety; no cues or assist needed    Transfers Overall transfer level: Needs assistance Equipment used: Rolling walker (2 wheels) Transfers: Sit to/from Stand Sit to Stand: Contact guard assist           General transfer comment: Pt able to come to stand from edge of bed and standard toilet with CGA for safety, vc for sequencing with RW    Ambulation/Gait Ambulation/Gait assistance: Contact guard assist Gait Distance (Feet): 40 Feet (with RW; 60 no device) Assistive device: Rolling walker (2 wheels), None Gait Pattern/deviations: Step-through pattern, Decreased stride length, Trunk flexed, Narrow base of support Gait velocity: reduced Gait velocity interpretation: 1.31 - 2.62 ft/sec, indicative of limited community ambulator   General Gait Details: initially with RW with only slight LLE medial rotation, with fair distance between his feet and no imbalance or scissoring; progressed to no device and pt with good balance although slightly guarded with decr velocity   Stairs             Wheelchair Mobility     Tilt Bed    Modified Rankin (Stroke Patients Only) Modified Rankin (Stroke Patients Only) Pre-Morbid Rankin Score: Moderate disability Modified Rankin: Moderately severe disability     Balance Overall balance assessment: Needs assistance Sitting-balance support: No upper extremity supported, Feet supported Sitting balance-Leahy Scale: Fair     Standing balance support: No upper extremity supported, During functional activity Standing balance-Leahy Scale: Fair Standing balance comment: able to stand with eyes closed x 10 sec; stagger stance x 30 sec with no imbalance  Cognition Arousal: Alert Behavior During Therapy: WFL for tasks assessed/performed Overall Cognitive Status: History of cognitive impairments - at baseline                                 General Comments: follows  one step commands, oriented to month and year;        Exercises      General Comments General comments (skin integrity, edema, etc.): Brother present and states his mobility can vary widely from day to day. He prefers pt use a RW and gait belt for some time until it is clear he may not need it anymore. Requested it be clear in the discharge paperwork that the group home is to use RW and gait belt.      Pertinent Vitals/Pain Pain Assessment Pain Assessment: No/denies pain    Home Living                          Prior Function            PT Goals (current goals can now be found in the care plan section) Acute Rehab PT Goals Patient Stated Goal: to rest Time For Goal Achievement: 12/31/22 Potential to Achieve Goals: Fair Progress towards PT goals: Progressing toward goals    Frequency    Min 1X/week      PT Plan      Co-evaluation              AM-PAC PT "6 Clicks" Mobility   Outcome Measure  Help needed turning from your back to your side while in a flat bed without using bedrails?: A Little Help needed moving from lying on your back to sitting on the side of a flat bed without using bedrails?: A Little Help needed moving to and from a bed to a chair (including a wheelchair)?: A Little Help needed standing up from a chair using your arms (e.g., wheelchair or bedside chair)?: A Little Help needed to walk in hospital room?: A Little Help needed climbing 3-5 steps with a railing? : A Lot 6 Click Score: 17    End of Session Equipment Utilized During Treatment: Gait belt Activity Tolerance: Patient tolerated treatment well Patient left: in bed;with call bell/phone within reach;with bed alarm set;with family/visitor present Nurse Communication: Mobility status;Other (comment) (condom catheter fell off) PT Visit Diagnosis: Unsteadiness on feet (R26.81);Other abnormalities of gait and mobility (R26.89);Muscle weakness (generalized) (M62.81);Difficulty in  walking, not elsewhere classified (R26.2);Other symptoms and signs involving the nervous system (R29.898)     Time: 1353-1416 PT Time Calculation (min) (ACUTE ONLY): 23 min  Charges:    $Gait Training: 23-37 mins PT General Charges $$ ACUTE PT VISIT: 1 Visit                      Jerolyn Center, PT Acute Rehabilitation Services  Office [phone]    Zena Amos 12/18/2022, 2:29 PM

## 2022-12-18 NOTE — Progress Notes (Signed)
PT Cancellation Note  Patient Details Name: Frank Conley MRN: [id number] DOB: 11/14/1960   Cancelled Treatment:    Reason Eval/Treat Not Completed: Patient at procedure or test/unavailable  Off the floor for MRI   Jerolyn Center, PT Acute Rehabilitation Services  Office [phone]   Zena Amos 12/18/2022, 11:34 AM

## 2022-12-18 NOTE — Care Management CC44 (Signed)
Condition Code 44 Documentation Completed  Patient Details  Name: Frank Conley MRN: [id number] Date of Birth: [birth year]   Condition Code 44 given:  Yes Patient signature on Condition Code 44 notice:  Yes Documentation of 2 MD's agreement:  Yes Code 44 added to claim:  Yes    Kermit Balo, RN 12/18/2022, 2:31 PM

## 2022-12-18 NOTE — Evaluation (Signed)
Clinical/Bedside Swallow Evaluation Patient Details  Name: Frank Conley MRN: [id number] Date of Birth: May 08, 1960  Today's Date: 12/18/2022 Time: SLP Start Time (ACUTE ONLY): 1518 SLP Stop Time (ACUTE ONLY): 1535 SLP Time Calculation (min) (ACUTE ONLY): 17 min  Past Medical History:  Past Medical History:  Diagnosis Date   Depression    Hypertension    MGUS (monoclonal gammopathy of unknown significance)    Pervasive developmental disorder    Seizures (HCC)    SSS (sick sinus syndrome) (HCC)    PPM placed   Past Surgical History:  Past Surgical History:  Procedure Laterality Date   PACEMAKER IMPLANT     HPI:  Frank Conley is a 62 yo male presenting to ED 11/10 with two week history of L sided weakness and increased falls. Previously seen by SLP in November 2020 with chronic dysphagia characterized primarily by impulsivity. MBS with mild dysphagia largely due to suspected osteophytes, which he compensated for with adequate airway protection. PMH includes depression, HTN, MGUS, developmental delay, seizures    Assessment / Plan / Recommendation  Clinical Impression  Pt sitting upright in bed with his brother present throughout the session. They report pt continues to be quite impulsive with PO intake, requiring frequent cueing from staff for rate control. Pt's brother states pt typically consumes diet of small, bite-sized pieces. Oral motor exam WFL with pt exhibiting a strong cough. When allowed to self-feed consecutive straw sips of thin liquids, pt appears impulsive and coughed immediately after the swallow. No s/s of dysphagia noted with purees or solids. Provided education re: use of a Provale cup to control pt's rate. Trialed thin liquids via Provale cup with no further signs concerning for aspiration. Recommend downgrading diet to Dys 3 texture solids and continuing thin liquids via Provale cup or with full supervision to cue for use of a slow rate. As pt appears to be at his  baseline for swallowing, SLP will s/o at this time. SLP Visit Diagnosis: Dysphagia, unspecified (R13.10)    Aspiration Risk  Mild aspiration risk    Diet Recommendation Dysphagia 3 (Mech soft);Thin liquid    Liquid Administration via: Cup;Straw;Other (Comment) (provale cup) Medication Administration: Whole meds with puree Supervision: Staff to assist with self feeding;Full supervision/cueing for compensatory strategies Compensations: Minimize environmental distractions;Slow rate;Small sips/bites Postural Changes: Seated upright at 90 degrees    Other  Recommendations Oral Care Recommendations: Oral care BID    Recommendations for follow up therapy are one component of a multi-disciplinary discharge planning process, led by the attending physician.  Recommendations may be updated based on patient status, additional functional criteria and insurance authorization.  Follow up Recommendations No SLP follow up      Assistance Recommended at Discharge    Functional Status Assessment Patient has not had a recent decline in their functional status  Frequency and Duration            Prognosis Prognosis for improved oropharyngeal function: Good Barriers to Reach Goals: Time post onset      Swallow Study   General HPI: Frank Conley is a 62 yo male presenting to ED 11/10 with two week history of L sided weakness and increased falls. Previously seen by SLP in November 2020 with chronic dysphagia characterized primarily by impulsivity. MBS with mild dysphagia largely due to suspected osteophytes, which he compensated for with adequate airway protection. PMH includes depression, HTN, MGUS, developmental delay, seizures Type of Study: Bedside Swallow Evaluation Previous Swallow Assessment: see HPI Diet  Prior to this Study: Regular;Thin liquids (Level 0) Temperature Spikes Noted: No Respiratory Status: Room air History of Recent Intubation: No Behavior/Cognition:  Alert;Cooperative;Pleasant mood Oral Cavity Assessment: Within Functional Limits Oral Care Completed by SLP: No Oral Cavity - Dentition: Adequate natural dentition Vision: Functional for self-feeding Self-Feeding Abilities: Needs assist Patient Positioning: Upright in bed Baseline Vocal Quality: Normal Volitional Cough: Strong Volitional Swallow: Able to elicit    Oral/Motor/Sensory Function Overall Oral Motor/Sensory Function: Within functional limits   Ice Chips Ice chips: Not tested   Thin Liquid Thin Liquid: Impaired Presentation: Straw Pharyngeal  Phase Impairments: Cough - Immediate    Nectar Thick Nectar Thick Liquid: Not tested   Honey Thick Honey Thick Liquid: Not tested   Puree Puree: Within functional limits Presentation: Spoon   Solid     Solid: Within functional limits Presentation: Self Fed      Gwynneth Aliment, M.A., CF-SLP Speech Language Pathology, Acute Rehabilitation Services  Secure Chat preferred [phone]  12/18/2022,3:46 PM

## 2022-12-18 NOTE — Discharge Instructions (Addendum)
Frank Conley,  You were in the hospital. It does not appear you had a stroke, but you do have blood vessel disease that makes it easy for you to develop a stroke. It is possible you had a transient ischemic attack, which can be a precursor to stroke. The neurologist recommends aspirin and plavix for three weeks, followed by aspirin alone. Please follow-up with your PCP and the neurologist.

## 2022-12-18 NOTE — Progress Notes (Signed)
Reviewed DC instructions with Alice Reichert, pts brother. Instructions understood. Reminded him to take gait bait. Additional DC paperwork printed for brothers possession.

## 2022-12-18 NOTE — Plan of Care (Signed)

## 2023-01-01 NOTE — Therapy (Unsigned)
OUTPATIENT PHYSICAL THERAPY NEURO EVALUATION   [name] MRN: [id number] DOB:December 13, 1960, 62 y.o., male Today's Date: 01/02/2023   PCP: Frank [facility] REFERRING PROVIDER: Rodney Cruise, [facility]  END OF SESSION:  PT End of Session - 01/02/23 0908     Visit Number 1    Number of Visits 24    Date for PT Re-Evaluation 03/27/23    PT Start Time 0935    PT Stop Time 1015    PT Time Calculation (min) 40 min    Equipment Utilized During Treatment Gait belt    Activity Tolerance Patient tolerated treatment well    Behavior During Therapy WFL for tasks assessed/performed             Past Medical History:  Diagnosis Date   Depression    Hypertension    MGUS (monoclonal gammopathy of unknown significance)    Pervasive developmental disorder    Seizures (HCC)    SSS (sick sinus syndrome) (HCC)    PPM placed   Past Surgical History:  Procedure Laterality Date   PACEMAKER IMPLANT     Patient Active Problem List   Diagnosis Date Noted   Vertebral artery stenosis 12/18/2022   Left-sided weakness 12/17/2022   MGUS (monoclonal gammopathy of unknown significance) 07/07/2021   Multifocal pneumonia 12/20/2018   Seizure disorder (HCC) 12/20/2018   COVID-19 virus infection 12/20/2018   Acute respiratory failure with hypoxia (HCC) 12/20/2018   Essential hypertension 12/20/2018   Hyponatremia 12/20/2018   Pancytopenia (HCC) 12/20/2018   Developmental delay disorder 12/20/2018   Pneumonia due to COVID-19 virus 12/20/2018   CKD (chronic kidney disease) stage 3, GFR 30-59 ml/min (HCC) 12/20/2018   Age-related nuclear cataract of both eyes 07/18/2017   Early dry stage nonexudative age-related macular degeneration of both eyes 07/18/2017   Myopia with astigmatism and presbyopia, bilateral 07/18/2017   Onychomycosis 06/29/2016   Risk for falls 02/10/2015   Pacemaker 11/06/2014   Septic shock (HCC) 11/06/2014   Dysarthria 10/31/2014   Altered mental status  10/30/2014   Thrombocytopenia (HCC) 10/08/2014   Medication monitoring encounter 04/24/2014   Tremor 04/24/2014   Unsteadiness on feet 04/24/2014   History of nonmelanoma skin cancer 07/22/2013   Colon polyps 07/09/2013   Enuresis, primary, functional 05/09/2013   Gait disturbance 04/17/2013   Hemorrhoid 04/17/2013   Intention tremor 04/17/2013   Actinic keratosis 05/27/2010   Major depressive disorder, single episode, severe, with psychotic behavior (HCC) 03/21/2010   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   Pervasive developmental disorder 03/21/2010   Obsessive-compulsive disorder 03/07/2010   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   Obesity due to excess calories 02/25/2010    ONSET DATE: November 2024  REFERRING DIAG:  Diagnosis  G45.9 (ICD-10-CM) - TIA (transient ischemic attack)    THERAPY DIAG:  Muscle weakness (generalized)  Other lack of coordination  Difficulty in walking, not elsewhere classified  Other abnormalities of gait and mobility  Rationale for Evaluation and Treatment: Rehabilitation  SUBJECTIVE:  SUBJECTIVE STATEMENT: Pt is a 62 year old male presenting to physical therapy for issues concerning balance and strength. At baseline, pt has a history of intellectual disabilities and lives in a group home. Pt is accompanied by his caretaker where they report he had a "slight stroke" early November where he spent a couple of days at [facility] on the third floor. According to ED notes from his stay (11/9-11/11), pt was brought in after a syncopal episode where they found no evidence of stroke on the CT, but significant vertebral artery stenosis. Prior to this recent admission, he was living in a group home and independent with most ADL's including getting himself dressed and bathing.  Since then, he now requires assistance with basic ADL's and uses a two wheeled rolling [facility] for ambulation. He reports his left side is now "weaker" since his hospital stay. In the last six months, he has had two falls with both taking place in the bathroom. His caretaker reports one fall was when he was attempting to get out of the shower and hit his head on the sink. He has had two hospital admissions over the last year both related to falls. His typical day consists of going to school at the group home and watching television. He enjoys looking at antique shops in his free time and enjoys home improvement shows.   Pt accompanied by:  Caretaker  PERTINENT HISTORY:  Pt has a past medical history significant for sick sinus syndrome, vertebral artery stenosis, pacemaker implant, developmental delay disorder, hypertension, and seizure disorder. Pt is presenting to physical therapy for issues concerning balance and strength. At baseline, pt has a history of intellectual disabilities and lives in a group home. According to ED notes from his stay (11/9-11/11), pt was brought in after a syncopal episode where they found no evidence of stroke on the CT, but significant vertebral artery stenosis. While in the ED, they interrogated his pacemaker and ruled out issues related to balance from cardiac function. Prior to this recent admission, he was living in a group home and independent with most ADL's including getting himself dressed and bathing. Since then, he now requires assistance with basic ADL's and uses a two wheeled rolling [facility] for ambulation. He has had two hospital admissions over the last year both related to falls. He enjoys looking at antique shops in his free time and watching home improvement shows.    PAIN:  Are you having pain? No and pt reports achy feeling in the morning   PRECAUTIONS: Fall  RED FLAGS: {PT Red Flags:29287}   WEIGHT BEARING RESTRICTIONS: No  FALLS: Has patient fallen  in last 6 months? Yes. Number of falls 2 Pt's caretaker reports one fall when trying to get out of the shower where he hit his head on the sink.   LIVING ENVIRONMENT: Lives with: lives in an adult home Lives in: Group home Stairs: No  Has following equipment at home: [facility] - 2 wheeled and shower chair rolling wl and shower chair   PLOF: Needs assistance with ADLs   PATIENT GOALS: work on stairs, walk like he used to  OBJECTIVE:  Note: Objective measures were completed at Evaluation unless otherwise noted.  DIAGNOSTIC FINDINGS:   MRI HEAD WITHOUT CONTRAST- 12/18/2022  COMPARISON:  Head CT December 16, 2022.   FINDINGS: Brain: Scattered foci no acute infarction, hemorrhage, hydrocephalus, extra-axial collection or mass lesion. Scattered foci T2 hyperintensity are seen within the white matter of the cerebral hemispheres, nonspecific. Mild parenchymal  volume loss.   IMPRESSION: 1. No acute intracranial abnormality. 2. Scattered foci of T2 hyperintensity within the white matter of the cerebral hemispheres, nonspecific but may represent mild chronic microvascular ischemic changes. 3. Mild parenchymal volume loss.    VITALS BP: 107/67 mmHg in seated HR: 70 bpm   COGNITION: Overall cognitive status: History of cognitive impairments - at baseline   SENSATION: Not tested      POSTURE: forward head and increased thoracic kyphosis  LOWER EXTREMITY ROM:     {AROM/PROM:27142}  Right Eval Left Eval  Hip flexion    Hip extension    Hip abduction    Hip adduction    Hip internal rotation    Hip external rotation    Knee flexion    Knee extension    Ankle dorsiflexion    Ankle plantarflexion    Ankle inversion    Ankle eversion     (Blank rows = not tested)  LOWER EXTREMITY MMT:    MMT Right Eval Left Eval  Hip flexion 4 4-  Hip extension    Hip abduction 4-** 4-**  Hip adduction 4* 4*  Hip internal rotation    Hip external rotation    Knee flexion 3+ 3   Knee extension 3+ 3  Ankle dorsiflexion 4 3+  Ankle plantarflexion    Ankle inversion    Ankle eversion    (Blank rows = not tested) **Hip abduction and adduction tested in seated BED MOBILITY:  {Bed mobility:24027}     TRANSFERS: Assistive device utilized: None  Sit to stand: Complete Independence Stand to sit: Complete Independence Chair to chair: Complete Independence      STAIRS: Assess at next visit   GAIT: Gait pattern: two point step to with rolling Ahmon Tosi, decreased stride length, Right foot flat, Left foot flat, trunk flexed, narrow BOS, poor foot clearance- Right, and poor foot clearance- Left Distance walked: >500 ft Assistive device utilized: Environmental consultant - 2 wheeled Level of assistance: SBA for safety  Comments: forward flexed trunk, increased adduction of LE, path deviation to the left, when completing turns pt lifts Nikoleta Dady off the ground, narrow BOS, left lateral lean , genuvarus of right LE dynamic in mid stance , trendelenburg on right hip for ligamentous lateral stability in midstance, gait deviations dramatically more deviated after 4 minutes of ambulation resultant cross-over and catching of toes on contralateral heel in swing phase bilaterally   FUNCTIONAL TESTS:  5 times sit to stand: 29.3 with UE on thigh   Timed up and go (TUG): 27.96 with two wheeled Libby Goehring, 34.21 without AD 6 minute walk test: 520 ft with rolling Macklin Jacquin, increased fatigue  Berg Balance Scale: To be completed next visit   PATIENT SURVEYS:  FOTO 58 - completed with assistance from SPT and caretaker   TODAY'S TREATMENT:  DATE: 01/02/23    Evaluation components above performed in today's session.   PATIENT EDUCATION: Education details: POC, Goals,  Person educated: Patient and Caregiver   Education method: Explanation Education comprehension: verbalized  understanding and needs further education  HOME EXERCISE PROGRAM: To be provided next visit   GOALS: Goals reviewed with patient? Yes  SHORT TERM GOALS: Target date: 02/13/2023  Patient will be independent in home exercise program to improve strength/mobility for better functional independence with ADLs. Baseline: To be provided next visit  Goal status: INITIAL  2.  N/A Baseline:  Goal status: INITIAL    LONG TERM GOALS: Target date: 03/27/23  Patient will increase FOTO score to equal to or greater than   71  to demonstrate statistically significant improvement in mobility and quality of life.  Baseline: 58 Goal status: INITIAL  2.  Patient (> 73 years old) will complete five times sit to stand test in < 15 seconds indicating an increased LE strength and improved balance. Baseline: 29.33 Goal status: INITIAL  3.  Patient will increase six minute walk test distance to >1000 for progression to community ambulator and improve gait ability Baseline: 520 ft with Axelle Szwed Goal status: INITIAL  4.  Patient will reduce timed up and go to <15 seconds to reduce fall risk and demonstrate improved transfer/gait ability. Baseline: 27.96 with Aino Heckert, 34.21 without AD Goal status: INITIAL  5.  Patient will increase Berg Balance score by > 6 points to demonstrate decreased fall risk during functional activities. Baseline: To be completed next visit  Goal status: INITIAL  6.  Patient will ascend/descend 5 stairs with least restrictive assistive device without loss of balance to improve functional mobility.   Baseline: Assess next visit  Goal status: INITIAL  ASSESSMENT:  CLINICAL IMPRESSION: Patient is a 62 y.o. male who was seen today for physical therapy evaluation and treatment for issues concerning gait and balance. At baseline, patient has a history significant for developmental delay and resides at a group home where he uses a two wheeled rolling Karen Huhta for ambulation. He presents  with weakness in both lower extremities with the left weaker than the right. He is well below his age related norms for the six minute walk test demonstrating a need to progress endurance and aerobic capacity. He ambulates with decreased foot clearance and adduction of the LE's which challenges his stability and leads to LOB. His 5XSTS and TUG score puts him at an increased risk of falls and demonstrates impairments in lower extremity functional strength. He is limited by his overall cognitive status which impacts his safety awareness throughout mobility. Next session will include further assessment of balance and functional mobility. Pt will continue to benefit from skilled physical therapy decrease risk of falls, improve quality of life, and maximize function.     OBJECTIVE IMPAIRMENTS: Abnormal gait, decreased activity tolerance, decreased balance, decreased cognition, decreased coordination, decreased endurance, decreased knowledge of use of DME, decreased mobility, difficulty walking, decreased strength, decreased safety awareness, and improper body mechanics.   ACTIVITY LIMITATIONS: carrying, lifting, bending, standing, squatting, stairs, bathing, toileting, and dressing  PARTICIPATION LIMITATIONS: meal prep, cleaning, laundry, medication management, personal finances, driving, shopping, community activity, occupation, and yard work  PERSONAL FACTORS: Behavior pattern and 3+ comorbidities: sick sinus syndrome, vertebral artery stenosis, pacemaker implant, developmental delay disorder, hypertension, and seizure disorder.  are also affecting patient's functional outcome.  REHAB POTENTIAL: Good  CLINICAL DECISION MAKING: Evolving/moderate complexity  EVALUATION COMPLEXITY: Moderate  PLAN:  PT FREQUENCY: 1-2x/week  PT DURATION: 12 weeks  PLANNED INTERVENTIONS: 97110-Therapeutic exercises, 97530- Therapeutic activity, 97112- Neuromuscular re-education, [phone]- Self Care, 19147- Manual therapy,  [phone]- Gait training, [phone]- Orthotic Fit/training, [phone]- Electrical stimulation (unattended), Patient/Family education, Balance training, Stair training, Joint mobilization, Cryotherapy, and Moist heat  PLAN FOR NEXT SESSION: Sharlene Motts, observe stair navigation, develop HEP, LE strengthening   Randon Goldsmith, Student-PT Randon Goldsmith, Student-PT 01/02/2023, 11:23 AM

## 2023-01-02 ENCOUNTER — Ambulatory Visit: Payer: Medicare Other

## 2023-01-02 ENCOUNTER — Ambulatory Visit: Payer: Medicare Other | Attending: Student | Admitting: Occupational Therapy

## 2023-01-02 DIAGNOSIS — R2689 Other abnormalities of gait and mobility: Secondary | ICD-10-CM

## 2023-01-02 DIAGNOSIS — R278 Other lack of coordination: Secondary | ICD-10-CM | POA: Insufficient documentation

## 2023-01-02 DIAGNOSIS — R262 Difficulty in walking, not elsewhere classified: Secondary | ICD-10-CM

## 2023-01-02 DIAGNOSIS — M6281 Muscle weakness (generalized): Secondary | ICD-10-CM | POA: Diagnosis present

## 2023-01-02 DIAGNOSIS — R2681 Unsteadiness on feet: Secondary | ICD-10-CM | POA: Diagnosis not present

## 2023-01-02 NOTE — Therapy (Addendum)
OUTPATIENT OCCUPATIONAL THERAPY NEURO EVALUATION  [name] MRN: [id number] DOB:03/21/1960, 62 y.o., male Today's Date: 01/02/2023  PCP: Frank Cashing, MD REFERRING PROVIDER: Terisa Starr, MD  END OF SESSION:  OT End of Session - 01/02/23 0854     Visit Number 1    Number of Visits 24    Date for OT Re-Evaluation 03/27/23    OT Start Time 0850    OT Stop Time 0930    OT Time Calculation (min) 40 min    Activity Tolerance Patient tolerated treatment well    Behavior During Therapy [facility] for tasks assessed/performed             Past Medical History:  Diagnosis Date   Depression    Hypertension    MGUS (monoclonal gammopathy of unknown significance)    Pervasive developmental disorder    Seizures (HCC)    SSS (sick sinus syndrome) (HCC)    PPM placed   Past Surgical History:  Procedure Laterality Date   PACEMAKER IMPLANT     Patient Active Problem List   Diagnosis Date Noted   Vertebral artery stenosis 12/18/2022   Left-sided weakness 12/17/2022   MGUS (monoclonal gammopathy of unknown significance) 07/07/2021   Multifocal pneumonia 12/20/2018   Seizure disorder (HCC) 12/20/2018   COVID-19 virus infection 12/20/2018   Acute respiratory failure with hypoxia (HCC) 12/20/2018   Essential hypertension 12/20/2018   Hyponatremia 12/20/2018   Pancytopenia (HCC) 12/20/2018   Developmental delay disorder 12/20/2018   Pneumonia due to COVID-19 virus 12/20/2018   CKD (chronic kidney disease) stage 3, GFR 30-59 ml/min (HCC) 12/20/2018   Age-related nuclear cataract of both eyes 07/18/2017   Early dry stage nonexudative age-related macular degeneration of both eyes 07/18/2017   Myopia with astigmatism and presbyopia, bilateral 07/18/2017   Onychomycosis 06/29/2016   Risk for falls 02/10/2015   Pacemaker 11/06/2014   Septic shock (HCC) 11/06/2014   Dysarthria 10/31/2014   Altered mental status 10/30/2014   Thrombocytopenia (HCC) 10/08/2014    Medication monitoring encounter 04/24/2014   Tremor 04/24/2014   Unsteadiness on feet 04/24/2014   History of nonmelanoma skin cancer 07/22/2013   Colon polyps 07/09/2013   Enuresis, primary, functional 05/09/2013   Gait disturbance 04/17/2013   Hemorrhoid 04/17/2013   Intention tremor 04/17/2013   Actinic keratosis 05/27/2010   Major depressive disorder, single episode, severe, with psychotic behavior (HCC) 03/21/2010   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   Pervasive developmental disorder 03/21/2010   Obsessive-compulsive disorder 03/07/2010   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   Obesity due to excess calories 02/25/2010    ONSET DATE: 12/16/2022  REFERRING DIAG: Left sided weakness  THERAPY DIAG:  Muscle weakness (generalized)  Other lack of coordination  Rationale for Evaluation and Treatment: Rehabilitation  SUBJECTIVE:   SUBJECTIVE STATEMENT: Pt. was accompanied by his personal caregiver, [facility] Pt accompanied by:  Personal caregiver, Frank [facility]  PERTINENT HISTORY:   Pt. is a 62 y.o. male who was admitted to the [facility] with Left sided weakness, and left sided weakness, vertebral artery stenosis, sick sinus syndrome, Essential tremor, Pervasive Developmental Delay Disorder, Pacemaker implant s/p 6-7 years per caregiver. Pt. Resides at [facility], and attends [facility].   PRECAUTIONS: ICD/Pacemaker 6-7 years, Fall, Pureed diet, with sipping cup  WEIGHT BEARING RESTRICTIONS: No  PAIN:  Are you having pain? No  FALLS: Has patient fallen in last 6 months? Yes. Number of falls 2  LIVING ENVIRONMENT: Lives with: [facility] Lives in:  No steps to enter Has following equipment at [facility]: Walker - 2 wheeled and shower chair  PLOF: Independent with basic ADLs  PATIENT GOALS:    To be able to walk like everyone else  OBJECTIVE:  Note: Objective measures were completed at Evaluation unless otherwise noted.  HAND DOMINANCE:  Left  ADLs:  Transfers/ambulation related to ADLs: Eating: Independent uses a sipping cup, pureed diet Grooming: Independent with brushing teeth, caregiver assist with shaving UB Dressing: MinA LB Dressing: Supervision Toileting: Caregiver assist Bathing: Caregiver assist Tub Shower transfers: MinA Equipment: none  IADLs: Shopping: N/A Light housekeeping:  Caregiver Assist now Meal Prep: Pureed/ Group [facility] prepares all meals Community mobility:  Relies on transportation services Medication management: Caregivers provide medication management Financial management: Caregivers Assist Handwriting: 50% legible  MOBILITY STATUS: Needs Assist: RW and Hx of falls  POSTURE COMMENTS:  Leans to the left in supported  sitting   ACTIVITY TOLERANCE: Activity tolerance: Limited  FUNCTIONAL OUTCOME MEASURES: FOTO: TBD  UPPER EXTREMITY ROM:    Active ROM Right eval Left eval  Shoulder flexion 74(100) 80(122)  Shoulder abduction 73(94) 70(98)  Shoulder adduction    Shoulder extension    Shoulder internal rotation    Shoulder external rotation    Elbow flexion [facility] WFL  Elbow extension [facility] WFL  Wrist flexion [facility] WFL  Wrist extension [facility] WFL  Wrist ulnar deviation    Wrist radial deviation    Wrist pronation    Wrist supination    (Blank rows = not tested)  UPPER EXTREMITY MMT:     MMT Right eval Left eval  Shoulder flexion 3-/5 3-/5  Shoulder abduction 3-/5 3-/5  Shoulder adduction    Shoulder extension    Shoulder internal rotation    Shoulder external rotation    Middle trapezius    Lower trapezius    Elbow flexion 4/5 4/5  Elbow extension 4-/5 4-/5  Wrist flexion 4-/5 4-/5  Wrist extension 4-/5 4-/5  Wrist ulnar deviation    Wrist radial deviation    Wrist pronation    Wrist supination    (Blank rows = not tested)  HAND FUNCTION: Grip strength: Right: 38 lbs; Left: 27 lbs, Lateral pinch: Right: 13 lbs, Left: 14 lbs, and 3 point pinch: Right: 16 lbs,  Left: 13 lbs  COORDINATION: 9 Hole Peg test: Right: TBD sec; Left: TBD sec  SENSATION: Light Touch: TBD Proprioception: TBD  EDEMA: N/A  MUSCLE TONE: Ataxia  COGNITION: Overall cognitive status: Within functional limits for tasks assessed   Caregiver reports becomes agitated more easily    VISION ASSESSMENT:   Glasses  PERCEPTION: Intact  PRAXIS: Impaired: Motor planning   TODAY'S TREATMENT:                                                                                                                              DATE: 01/02/2023   OT initial evaluation performed  PATIENT EDUCATION: Education details: OT services,  POC, goals Person educated: Patient and Caregiver [facility] Education method: Explanation, Demonstration, and Verbal cues Education comprehension: verbalized understanding, returned demonstration, verbal cues required, and needs further education  [facility] EXERCISE [facility]:   To continue to assess, and provide as needed   GOALS: Goals reviewed with patient? Yes  SHORT TERM GOALS: Target date: 02/13/2023    Pt. Will require supervision with HEPs for BE UEs. Baseline: Eval: No current HEP Goal status: INITIAL  LONG TERM GOALS: Target date: 03/27/2023    Pt. Will assume upright sitting with with minimal cues for the duration of a 15 min. Task.  Baseline: Pt. with positive left sided leaning. Goal status: INITIAL  2.  Pt. Will increase bilateral shoulder flexion ROM by 10 degrees to assist with bathing/washing his hair Baseline: Eval: shoulder flexion: Right: 74(100) Left: 80(122)  Goal status: INITIAL  3.  Pt. Will improve bilateral shoulder abduction ROM by 10 degrees to assist with UE dressing skills. Baseline: Eval: shoulder abduction: Right: 73(94), Left: 70(98) Goal status: INITIAL  4.  Pt. Will improve left grip strength by 5# to be able to hold items securely Baseline: Eval: Right: 38# Left: 27# Goal status: INITIAL  5.  Pt. Will improve  [facility] skills to be able to independently, and efficiently manipulate small items. Baseline: Eval: TBD Goal status: INITIAL  6.  Pt. will increase FOTO score by 2 points to reflect Pt. perceived improvement with assessment specific ADL/IADL's.  Baseline: Eval: TBD Goal status: INITIAL  ASSESSMENT:  CLINICAL IMPRESSION:  Patient is a 62 y.o. male who was seen today for occupational therapy evaluation for left sided weakness, and left sided leaning. Pt. presents with left sided leaning in sitting, limited AROM, and PROM in bilateral shoulder flexion, and abduction, bilateral UE weakness, left grip strength, coordination which impact his ability to complete daily ADL, and IADL tasks. Pt. was previously independent with basic ADL care tasks, however now requires assist from caregivers. Pt. will benefit from OT services for ADL training, A/E training, UE there. Ex., neuromuscular re-education, and pt./caregiver education.  PERFORMANCE DEFICITS: in functional skills including ADLs, IADLs, coordination, dexterity, ROM, strength, Fine motor control, Gross motor control, balance, and UE functional use, cognitive skills including problem solving and safety awareness, and psychosocial skills including coping strategies, environmental adaptation, interpersonal interactions, and routines and behaviors.   IMPAIRMENTS: are limiting patient from ADLs, IADLs, work, play, leisure, and social participation.   CO-MORBIDITIES: has co-morbidities such as Pervasive Developmental Delay Disorder  that affects occupational performance. Patient will benefit from skilled OT to address above impairments and improve overall function.  MODIFICATION OR ASSISTANCE TO COMPLETE EVALUATION: Min-Moderate modification of tasks or assist with assess necessary to complete an evaluation.  OT OCCUPATIONAL PROFILE AND HISTORY: Detailed assessment: Review of records and additional review of physical, cognitive, psychosocial history related  to current functional performance.  CLINICAL DECISION MAKING: Moderate - several treatment options, min-mod task modification necessary  REHAB POTENTIAL: Good  EVALUATION COMPLEXITY: Moderate    PLAN:  OT FREQUENCY: 2x/week  OT DURATION: 12 weeks  PLANNED INTERVENTIONS: 97535 self care/ADL training, 16109 therapeutic exercise, 97530 therapeutic activity, 97112 neuromuscular re-education, 97140 manual therapy, 97010 moist heat, 97034 contrast bath, balance training, patient/family education, and DME and/or AE instructions  RECOMMENDED OTHER SERVICES: PT & ST  CONSULTED AND AGREED WITH PLAN OF CARE: Patient and family member/caregiver  PLAN FOR NEXT SESSION: Initiate Treatment   Olegario Messier, MS, OTR/L  01/02/2023, 8:58 AM

## 2023-01-08 ENCOUNTER — Ambulatory Visit: Payer: Medicare Other | Attending: Student | Admitting: Occupational Therapy

## 2023-01-08 ENCOUNTER — Encounter: Payer: Self-pay | Admitting: Occupational Therapy

## 2023-01-08 ENCOUNTER — Ambulatory Visit: Payer: Medicare Other | Admitting: Physical Therapy

## 2023-01-08 DIAGNOSIS — R262 Difficulty in walking, not elsewhere classified: Secondary | ICD-10-CM | POA: Diagnosis present

## 2023-01-08 DIAGNOSIS — R278 Other lack of coordination: Secondary | ICD-10-CM | POA: Insufficient documentation

## 2023-01-08 DIAGNOSIS — R2681 Unsteadiness on feet: Secondary | ICD-10-CM | POA: Diagnosis present

## 2023-01-08 DIAGNOSIS — M6281 Muscle weakness (generalized): Secondary | ICD-10-CM | POA: Diagnosis present

## 2023-01-08 DIAGNOSIS — R2689 Other abnormalities of gait and mobility: Secondary | ICD-10-CM

## 2023-01-08 NOTE — Therapy (Signed)
OUTPATIENT OCCUPATIONAL THERAPY NEURO EVALUATION  [name] MRN: [id number] DOB:03-25-1960, 62 y.o., male Today's Date: 01/08/2023  PCP: Billee Cashing, MD REFERRING PROVIDER: Terisa Starr, MD  END OF SESSION:  OT End of Session - 01/08/23 0814     Visit Number 2    Number of Visits 24    Date for OT Re-Evaluation 03/27/23    OT Start Time 0802    OT Stop Time 0842    OT Time Calculation (min) 40 min    Activity Tolerance Patient tolerated treatment well    Behavior During Therapy [facility] for tasks assessed/performed             Past Medical History:  Diagnosis Date   Depression    Hypertension    MGUS (monoclonal gammopathy of unknown significance)    Pervasive developmental disorder    Seizures (HCC)    SSS (sick sinus syndrome) (HCC)    PPM placed   Past Surgical History:  Procedure Laterality Date   PACEMAKER IMPLANT     Patient Active Problem List   Diagnosis Date Noted   Vertebral artery stenosis 12/18/2022   Left-sided weakness 12/17/2022   MGUS (monoclonal gammopathy of unknown significance) 07/07/2021   Multifocal pneumonia 12/20/2018   Seizure disorder (HCC) 12/20/2018   COVID-19 virus infection 12/20/2018   Acute respiratory failure with hypoxia (HCC) 12/20/2018   Essential hypertension 12/20/2018   Hyponatremia 12/20/2018   Pancytopenia (HCC) 12/20/2018   Developmental delay disorder 12/20/2018   Pneumonia due to COVID-19 virus 12/20/2018   CKD (chronic kidney disease) stage 3, GFR 30-59 ml/min (HCC) 12/20/2018   Age-related nuclear cataract of both eyes 07/18/2017   Early dry stage nonexudative age-related macular degeneration of both eyes 07/18/2017   Myopia with astigmatism and presbyopia, bilateral 07/18/2017   Onychomycosis 06/29/2016   Risk for falls 02/10/2015   Pacemaker 11/06/2014   Septic shock (HCC) 11/06/2014   Dysarthria 10/31/2014   Altered mental status 10/30/2014   Thrombocytopenia (HCC) 10/08/2014    Medication monitoring encounter 04/24/2014   Tremor 04/24/2014   Unsteadiness on feet 04/24/2014   History of nonmelanoma skin cancer 07/22/2013   Colon polyps 07/09/2013   Enuresis, primary, functional 05/09/2013   Gait disturbance 04/17/2013   Hemorrhoid 04/17/2013   Intention tremor 04/17/2013   Actinic keratosis 05/27/2010   Major depressive disorder, single episode, severe, with psychotic behavior (HCC) 03/21/2010   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   Pervasive developmental disorder 03/21/2010   Obsessive-compulsive disorder 03/07/2010   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   Obesity due to excess calories 02/25/2010    ONSET DATE: 12/16/2022  REFERRING DIAG: Left sided weakness  THERAPY DIAG:  Muscle weakness (generalized)  Other lack of coordination  Rationale for Evaluation and Treatment: Rehabilitation  SUBJECTIVE:   SUBJECTIVE STATEMENT: Pt. was accompanied by his personal caregiver, [name] Pt accompanied by:  Personal caregiver, [name]  PERTINENT HISTORY:   Pt. is a 62 y.o. male who was admitted to the hospital with Left sided weakness, and left sided weakness, vertebral artery stenosis, sick sinus syndrome, Essential tremor, Pervasive Developmental Delay Disorder, Pacemaker implant s/p 6-7 years per caregiver. Pt. Resides at [facility], and attends [facility].   PRECAUTIONS: ICD/Pacemaker 6-7 years, Fall, Pureed diet, with sipping cup  WEIGHT BEARING RESTRICTIONS: No  PAIN:  Are you having pain? No  FALLS: Has patient fallen in last 6 months? Yes. Number of falls 2  LIVING ENVIRONMENT: Lives with: [name] Lives in:  No steps to enter Has following equipment at home: Walker - 2 wheeled and shower chair  PLOF: Independent with basic ADLs  PATIENT GOALS:    To be able to walk like everyone else  OBJECTIVE:  Note: Objective measures were completed at Evaluation unless otherwise noted.  HAND DOMINANCE:  Left  ADLs:  Transfers/ambulation related to ADLs: Eating: Independent uses a sipping cup, pureed diet Grooming: Independent with brushing teeth, caregiver assist with shaving UB Dressing: MinA LB Dressing: Supervision Toileting: Caregiver assist Bathing: Caregiver assist Tub Shower transfers: MinA Equipment: none  IADLs: Shopping: N/A Light housekeeping:  Caregiver Assist now Meal Prep: Pureed/ Group Home prepares all meals Community mobility:  Relies on transportation services Medication management: Caregivers provide medication management Financial management: Caregivers Assist Handwriting: 50% legible  MOBILITY STATUS: Needs Assist: RW and Hx of falls  POSTURE COMMENTS:  Leans to the left in supported  sitting   ACTIVITY TOLERANCE: Activity tolerance: Limited  FUNCTIONAL OUTCOME MEASURES: FOTO: 73  UPPER EXTREMITY ROM:    Active ROM Right eval Left eval  Shoulder flexion 74(100) 80(122)  Shoulder abduction 73(94) 70(98)  Shoulder adduction    Shoulder extension    Shoulder internal rotation    Shoulder external rotation    Elbow flexion [facility] WFL  Elbow extension [facility] WFL  Wrist flexion [facility] WFL  Wrist extension [facility] WFL  Wrist ulnar deviation    Wrist radial deviation    Wrist pronation    Wrist supination    (Blank rows = not tested)  UPPER EXTREMITY MMT:     MMT Right eval Left eval  Shoulder flexion 3-/5 3-/5  Shoulder abduction 3-/5 3-/5  Shoulder adduction    Shoulder extension    Shoulder internal rotation    Shoulder external rotation    Middle trapezius    Lower trapezius    Elbow flexion 4/5 4/5  Elbow extension 4-/5 4-/5  Wrist flexion 4-/5 4-/5  Wrist extension 4-/5 4-/5  Wrist ulnar deviation    Wrist radial deviation    Wrist pronation    Wrist supination    (Blank rows = not tested)  HAND FUNCTION: Grip strength: Right: 38 lbs; Left: 27 lbs, Lateral pinch: Right: 13 lbs, Left: 14 lbs, and 3 point pinch: Right: 16 lbs, Left:  13 lbs  COORDINATION: 9 Hole Peg test: Right: 55 sec; Left: 47 sec  SENSATION: Light Touch: TBD Proprioception: TBD  EDEMA: N/A  MUSCLE TONE: Ataxia  COGNITION: Overall cognitive status: Within functional limits for tasks assessed   Caregiver reports becomes agitated more easily    VISION ASSESSMENT:   Glasses  PERCEPTION: Intact  PRAXIS: Impaired: Motor planning   TODAY'S TREATMENT:                                                                                                                              DATE: 01/08/2023    Self Care: Completed handwriting task with 50% legibility to sign  name in cursive, unable to print name with cues - suspect baseline cognition impairing.   Therapeutic Activity:  Pt worked on pinch strengthening in the left hand for lateral pinch using yellow, red, green, blue, and black resistive clips. Min cues for positioning. Pt performed gross gripping with grip strengthener. Pt worked on sustaining grip while grasping pegs and reaching at various heights with gripper set to  23.4# of grip strength resistance to remove pegs x 2 trials.      PATIENT EDUCATION: Education details: OT services, POC, goals Person educated: Patient and Caregiver Administrator: Explanation, Demonstration, and Verbal cues Education comprehension: verbalized understanding, returned demonstration, verbal cues required, and needs further education  HOME EXERCISE PROGRAM:   To continue to assess, and provide as needed   GOALS: Goals reviewed with patient? Yes  SHORT TERM GOALS: Target date: 02/13/2023    Pt. Will require supervision with HEPs for BE UEs. Baseline: Eval: No current HEP Goal status: INITIAL  LONG TERM GOALS: Target date: 03/27/2023    Pt. Will assume upright sitting with with minimal cues for the duration of a 15 min. Task.  Baseline: Pt. with positive left sided leaning. Goal status: INITIAL  2.  Pt. Will increase bilateral  shoulder flexion ROM by 10 degrees to assist with bathing/washing his hair Baseline: Eval: shoulder flexion: Right: 74(100) Left: 80(122)  Goal status: INITIAL  3.  Pt. Will improve bilateral shoulder abduction ROM by 10 degrees to assist with UE dressing skills. Baseline: Eval: shoulder abduction: Right: 73(94), Left: 70(98) Goal status: INITIAL  4.  Pt. Will improve left grip strength by 5# to be able to hold items securely Baseline: Eval: Right: 38# Left: 27# Goal status: INITIAL  5.  Pt. Will improve [facility] skills by 10 sec to be able to independently, and efficiently manipulate small items. Baseline: Eval: Left 55 sec Goal status: INITIAL  6.  Pt. will increase FOTO score by 2 points to reflect Pt. perceived improvement with assessment specific ADL/IADL's.  Baseline: Eval: 73 Goal status: INITIAL  ASSESSMENT:  CLINICAL IMPRESSION:  Pt. presents with caregiver reporting concern for high BP this AM (130/120) at group home following recent change in BP medication - checked during session seated BP 130/80 HR 59. Caregiver reports increased time and assistance to perform dressing, pt zips and unzips jacket on self and buttons/unbuttons shirt on table with increased time only. Completed FOTO and 9 hole PEG test with goals updated to reflect outcomes. Good tolerance for 2 trials removing pegs with gripper set to 23.4#. Pt. was previously independent with basic ADL care tasks, however now requires assist from caregivers. Pt. will benefit from OT services for ADL training, A/E training, UE there. Ex., neuromuscular re-education, and pt./caregiver education.  PERFORMANCE DEFICITS: in functional skills including ADLs, IADLs, coordination, dexterity, ROM, strength, Fine motor control, Gross motor control, balance, and UE functional use, cognitive skills including problem solving and safety awareness, and psychosocial skills including coping strategies, environmental adaptation, interpersonal  interactions, and routines and behaviors.   IMPAIRMENTS: are limiting patient from ADLs, IADLs, work, play, leisure, and social participation.   CO-MORBIDITIES: has co-morbidities such as Pervasive Developmental Delay Disorder  that affects occupational performance. Patient will benefit from skilled OT to address above impairments and improve overall function.  MODIFICATION OR ASSISTANCE TO COMPLETE EVALUATION: Min-Moderate modification of tasks or assist with assess necessary to complete an evaluation.  OT OCCUPATIONAL PROFILE AND HISTORY: Detailed assessment: Review of records and additional review of physical, cognitive,  psychosocial history related to current functional performance.  CLINICAL DECISION MAKING: Moderate - several treatment options, min-mod task modification necessary  REHAB POTENTIAL: Good  EVALUATION COMPLEXITY: Moderate    PLAN:  OT FREQUENCY: 2x/week  OT DURATION: 12 weeks  PLANNED INTERVENTIONS: 97535 self care/ADL training, 16109 therapeutic exercise, 97530 therapeutic activity, 97112 neuromuscular re-education, 97140 manual therapy, 97010 moist heat, 97034 contrast bath, balance training, patient/family education, and DME and/or AE instructions  RECOMMENDED OTHER SERVICES: PT & ST  CONSULTED AND AGREED WITH PLAN OF CARE: Patient and family member/caregiver  PLAN FOR NEXT SESSION: Initiate Treatment    Kathie Dike, M.S. OTR/L  01/08/23, 8:53 AM  ascom [phone]  01/08/2023, 8:53 AM

## 2023-01-08 NOTE — Therapy (Signed)
OUTPATIENT PHYSICAL THERAPY NEURO EVALUATION   [name] MRN: [id number] DOB:1961-01-23, 62 y.o., male Today's Date: 01/08/2023   PCP: Domenic Schwab. Danae Chen, MD REFERRING PROVIDER: Rodney Cruise, MD  END OF SESSION:  PT End of Session - 01/08/23 0848     Visit Number 2    Number of Visits 24    Date for PT Re-Evaluation 03/27/23    Authorization Type Medicare primary; Medicaid secondary    Authorization Time Period 01/02/23-03/27/23    PT Start Time 0848    PT Stop Time 0930    PT Time Calculation (min) 42 min    Equipment Utilized During Treatment Gait belt    Activity Tolerance Patient tolerated treatment well    Behavior During Therapy WFL for tasks assessed/performed             Past Medical History:  Diagnosis Date   Depression    Hypertension    MGUS (monoclonal gammopathy of unknown significance)    Pervasive developmental disorder    Seizures (HCC)    SSS (sick sinus syndrome) (HCC)    PPM placed   Past Surgical History:  Procedure Laterality Date   PACEMAKER IMPLANT     Patient Active Problem List   Diagnosis Date Noted   Vertebral artery stenosis 12/18/2022   Left-sided weakness 12/17/2022   MGUS (monoclonal gammopathy of unknown significance) 07/07/2021   Multifocal pneumonia 12/20/2018   Seizure disorder (HCC) 12/20/2018   COVID-19 virus infection 12/20/2018   Acute respiratory failure with hypoxia (HCC) 12/20/2018   Essential hypertension 12/20/2018   Hyponatremia 12/20/2018   Pancytopenia (HCC) 12/20/2018   Developmental delay disorder 12/20/2018   Pneumonia due to COVID-19 virus 12/20/2018   CKD (chronic kidney disease) stage 3, GFR 30-59 ml/min (HCC) 12/20/2018   Age-related nuclear cataract of both eyes 07/18/2017   Early dry stage nonexudative age-related macular degeneration of both eyes 07/18/2017   Myopia with astigmatism and presbyopia, bilateral 07/18/2017   Onychomycosis 06/29/2016   Risk for falls 02/10/2015    Pacemaker 11/06/2014   Septic shock (HCC) 11/06/2014   Dysarthria 10/31/2014   Altered mental status 10/30/2014   Thrombocytopenia (HCC) 10/08/2014   Medication monitoring encounter 04/24/2014   Tremor 04/24/2014   Unsteadiness on feet 04/24/2014   History of nonmelanoma skin cancer 07/22/2013   Colon polyps 07/09/2013   Enuresis, primary, functional 05/09/2013   Gait disturbance 04/17/2013   Hemorrhoid 04/17/2013   Intention tremor 04/17/2013   Actinic keratosis 05/27/2010   Major depressive disorder, single episode, severe, with psychotic behavior (HCC) 03/21/2010   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   Pervasive developmental disorder 03/21/2010   Obsessive-compulsive disorder 03/07/2010   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   Obesity due to excess calories 02/25/2010    ONSET DATE: November 2024  REFERRING DIAG:  Diagnosis  G45.9 (ICD-10-CM) - TIA (transient ischemic attack)    THERAPY DIAG:  Muscle weakness (generalized)  Other lack of coordination  Difficulty in walking, not elsewhere classified  Other abnormalities of gait and mobility  Rationale for Evaluation and Treatment: Rehabilitation  SUBJECTIVE:  SUBJECTIVE STATEMENT: No pain reported. States that he is a little tired due to difficulty falling asleep last night. No other medical updates.   Pt accompanied by:  Caretaker  PERTINENT HISTORY:  From eval  Pt is a 62 year old male presenting to physical therapy for issues concerning balance and strength. At baseline, pt has a history of intellectual disabilities and lives in a group home. Pt is accompanied by his caretaker where they report he had a "slight stroke" early November where he spent a couple of days at [facility] on the third floor. According to ED notes from his  stay (11/9-11/11), pt was brought in after a syncopal episode where they found no evidence of stroke on the CT, but significant vertebral artery stenosis. Prior to this recent admission, he was living in a group home and independent with most ADL's including getting himself dressed and bathing. Since then, he now requires assistance with basic ADL's and uses a two wheeled rolling walker for ambulation. He reports his left side is now "weaker" since his hospital stay. In the last six months, he has had two falls with both taking place in the bathroom. His caretaker reports one fall was when he was attempting to get out of the shower and hit his head on the sink. He has had two hospital admissions over the last year both related to falls. His typical day consists of going to school at the group home and watching television. He enjoys looking at antique shops in his free time and enjoys home improvement shows.   PMH: sick sinus syndrome, vertebral artery stenosis, pacemaker implant, I/DD, hypertension, and seizure disorder. Pt is referred to OPPT after recent increase in falls, syncope, concern for TIA. According to ED notes from his stay (11/9-11/11), pt was brought in after a syncopal episode where they found no evidence of stroke on the CT, but significant vertebral artery stenosis. While in the ED, pacemaker interrogated, ruled out. At baseline pt was living in a group home and independent with most ADL including getting himself dressed and bathing. Since then, he now requires assistance with basic ADL and uses a two wheeled rolling walker for ambulation. Several ED visits since October due to falls. Pt enjoys looking at antique shops in his free time and watching home improvement shows.    PAIN:  Are you having pain? No and pt reports achy feeling in the morning   PRECAUTIONS: Fall  WEIGHT BEARING RESTRICTIONS: No  FALLS: Has patient fallen in last 6 months? Yes. Number of falls 2 Pt's caretaker  reports one fall when trying to get out of the shower where he hit his head on the sink.   LIVING ENVIRONMENT: Lives with: lives in an adult home Lives in: Group home Stairs: No  Has following equipment at home: Dan Humphreys - 2 wheeled and shower chair rolling wl and shower chair   PLOF: Needs assistance with ADLs   PATIENT GOALS: work on stairs, walk like he used to  OBJECTIVE:  Note: Objective measures were completed at Evaluation unless otherwise noted.  DIAGNOSTIC FINDINGS:   MRI HEAD WITHOUT CONTRAST- 12/18/2022  COMPARISON:  Head CT December 16, 2022.   FINDINGS: Brain: Scattered foci no acute infarction, hemorrhage, hydrocephalus, extra-axial collection or mass lesion. Scattered foci T2 hyperintensity are seen within the white matter of the cerebral hemispheres, nonspecific. Mild parenchymal volume loss.   IMPRESSION: 1. No acute intracranial abnormality. 2. Scattered foci of T2 hyperintensity within the white matter of the  cerebral hemispheres, nonspecific but may represent mild chronic microvascular ischemic changes. 3. Mild parenchymal volume loss.    VITALS BP: 107/67 mmHg in seated HR: 70 bpm   COGNITION: Overall cognitive status: History of cognitive impairments - at baseline   SENSATION: Not tested  POSTURE: forward head and increased thoracic kyphosis   LOWER EXTREMITY MMT:    MMT Right Eval Left Eval  Hip flexion 4 4-  Hip extension    Hip abduction 4-** 4-**  Hip adduction 4* 4*  Hip internal rotation    Hip external rotation    Knee flexion 3+ 3  Knee extension 3+ 3  Ankle dorsiflexion 4 3+  Ankle plantarflexion    Ankle inversion    Ankle eversion    (Blank rows = not tested) **Hip abduction and adduction tested in seated  TRANSFERS: Assistive device utilized: None  Sit to stand: Complete Independence Stand to sit: Complete Independence Chair to chair: Complete Independence   STAIRS: Assess at next visit   GAIT: Gait pattern:  two point step to with rolling walker, decreased stride length, Right foot flat, Left foot flat, trunk flexed, narrow BOS, poor foot clearance- Right, and poor foot clearance- Left Distance walked: >500 ft Assistive device utilized: Environmental consultant - 2 wheeled Level of assistance: SBA for safety  Comments: forward flexed trunk, increased adduction of LE, path deviation to the left, when completing turns pt lifts walker off the ground, narrow BOS, left lateral lean , genuvarus of right LE dynamic in mid stance , trendelenburg on right hip for ligamentous lateral stability in midstance, gait deviations dramatically more deviated after 4 minutes of ambulation resultant cross-over and catching of toes on contralateral heel in swing phase bilaterally   FUNCTIONAL TESTS:  5 times sit to stand: 29.3 with UE on thigh   Timed up and go (TUG): 27.96 with two wheeled walker, 34.21 without AD 6 minute walk test: 520 ft with rolling walker, increased fatigue  Berg Balance Scale:.36/56  PATIENT SURVEYS:  FOTO 27 - completed with assistance from SPT and caretaker   TODAY'S TREATMENT:                                                                                                                              DATE: 01/08/23    Patient demonstrates increased fall risk as noted by score of  36 /56 on Berg Balance Scale.  (<36= high risk for falls, close to 100%; 37-45 significant >80%; 46-51 moderate >50%; 52-55 lower >25%)  Stair management training, heavy use of BUE on 2 bouts of 4 steps step through for ascent and step to for descent with hip rotation to the R.    [facility] PT Assessment - 01/08/23 0001       Berg Balance Test   Sit to Stand Able to stand  independently using hands    Standing Unsupported Able to stand 2 minutes with supervision    Sitting with Back Unsupported  but Feet Supported on Floor or Stool Able to sit safely and securely 2 minutes    Stand to Sit Controls descent by using hands    Transfers  Able to transfer safely, definite need of hands    Standing Unsupported with Eyes Closed Able to stand 10 seconds with supervision    Standing Unsupported with Feet Together Able to place feet together independently and stand for 1 minute with supervision    From Standing, Reach Forward with Outstretched Arm Can reach forward >12 cm safely (5")    From Standing Position, Pick up Object from Floor Able to pick up shoe, needs supervision    From Standing Position, Turn to Look Behind Over each Shoulder Turn sideways only but maintains balance    Turn 360 Degrees Needs close supervision or verbal cueing    Standing Unsupported, Alternately Place Feet on Step/Stool Able to complete >2 steps/needs minimal assist    Standing Unsupported, One Foot in Front Able to plae foot ahead of the other independently and hold 30 seconds    Standing on One Leg Tries to lift leg/unable to hold 3 seconds but remains standing independently    Total Score 36             Gait with RW 4 x 27ft and no AD 2 x 30ft cues for improved posture, but noted to have mild L lateral bias with fatigue   5x STS x 2 bouts. Cues for decreased use of BUE in transfers.   HEP education as listed below.   PATIENT EDUCATION: Education details: POC, Goals,  Person educated: Patient and Caregiver   Education method: Explanation Education comprehension: verbalized understanding and needs further education  HOME EXERCISE PROGRAM: Access Code: [email or web address] URL: [email or web address] Date: 01/08/2023 Prepared by: Grier Rocher  Exercises - Standing March with Counter Support  - 1 x daily - 7 x weekly - 3 sets - 10 reps - 2 hold - Standing Hip Abduction with Counter Support  - 1 x daily - 7 x weekly - 3 sets - 10 reps - 3 hold - Sit to Stand with Arms Crossed  - 1 x daily - 7 x weekly - 3 sets - 10 reps  GOALS: Goals reviewed with patient? Yes  SHORT TERM GOALS: Target date: 02/13/2023  Patient will be  independent in home exercise program to improve strength/mobility for better functional independence with ADLs. Baseline: To be provided next visit  Goal status: INITIAL  2.  N/A Baseline:  Goal status: INITIAL    LONG TERM GOALS: Target date: 03/27/23  Patient will increase FOTO score to equal to or greater than   71  to demonstrate statistically significant improvement in mobility and quality of life.  Baseline: 58 Goal status: INITIAL  2.  Patient (> 41 years old) will complete five times sit to stand test in < 15 seconds indicating an increased LE strength and improved balance. Baseline: 29.33 Goal status: INITIAL  3.  Patient will increase six minute walk test distance to >1000 for progression to community ambulator and improve gait ability Baseline: 520 ft with walker Goal status: INITIAL  4.  Patient will reduce timed up and go to <15 seconds to reduce fall risk and demonstrate improved transfer/gait ability. Baseline: 27.96 with walker, 34.21 without AD Goal status: INITIAL  5.  Patient will increase Berg Balance score by > 6 points to demonstrate decreased fall risk during functional activities. Baseline: 36 Goal status: INITIAL  6.  Patient will  ascend/descend 5 stairs with least restrictive assistive device without loss of balance to improve functional mobility.   Baseline: Assess next visit  Goal status: INITIAL  ASSESSMENT:  CLINICAL IMPRESSION: Patient is a 62 y.o. male who was seen today for physical therapy treatment: pt demonstrates increased fall risk with Berg  36. PT educated pt and caregiver in HEP with hand out provided to improved strength and balance. Education also provided for AD height for safety. Pt will continue to benefit from skilled physical therapy decrease risk of falls, improve quality of life, and maximize function.     OBJECTIVE IMPAIRMENTS: Abnormal gait, decreased activity tolerance, decreased balance, decreased cognition, decreased  coordination, decreased endurance, decreased knowledge of use of DME, decreased mobility, difficulty walking, decreased strength, decreased safety awareness, and improper body mechanics.   ACTIVITY LIMITATIONS: carrying, lifting, bending, standing, squatting, stairs, bathing, toileting, and dressing  PARTICIPATION LIMITATIONS: meal prep, cleaning, laundry, medication management, personal finances, driving, shopping, community activity, occupation, and yard work  PERSONAL FACTORS: Behavior pattern and 3+ comorbidities: sick sinus syndrome, vertebral artery stenosis, pacemaker implant, developmental delay disorder, hypertension, and seizure disorder.  are also affecting patient's functional outcome.  REHAB POTENTIAL: Good  CLINICAL DECISION MAKING: Evolving/moderate complexity  EVALUATION COMPLEXITY: Moderate  PLAN:  PT FREQUENCY: 1-2x/week  PT DURATION: 12 weeks  PLANNED INTERVENTIONS: 97110-Therapeutic exercises, 97530- Therapeutic activity, 97112- Neuromuscular re-education, 97535- Self Care, 91478- Manual therapy, [phone]- Gait training, [phone]- Orthotic Fit/training, [phone]- Electrical stimulation (unattended), Patient/Family education, Balance training, Stair training, Joint mobilization, Cryotherapy, and Moist heat  PLAN FOR NEXT SESSION:   develop HEP, LE strengthening dynamic balance and gait without AD    Golden Pop, PT 01/08/2023, 3:30 PM  Grier Rocher PT, DPT  Physical Therapist - [place]  [facility]  3:30 PM 01/08/23

## 2023-01-10 ENCOUNTER — Ambulatory Visit: Payer: Medicare Other | Admitting: Occupational Therapy

## 2023-01-10 ENCOUNTER — Ambulatory Visit: Payer: Medicare Other

## 2023-01-10 ENCOUNTER — Encounter: Payer: Self-pay | Admitting: Occupational Therapy

## 2023-01-10 DIAGNOSIS — R2681 Unsteadiness on feet: Secondary | ICD-10-CM

## 2023-01-10 DIAGNOSIS — R262 Difficulty in walking, not elsewhere classified: Secondary | ICD-10-CM

## 2023-01-10 DIAGNOSIS — M6281 Muscle weakness (generalized): Secondary | ICD-10-CM | POA: Diagnosis not present

## 2023-01-10 DIAGNOSIS — R278 Other lack of coordination: Secondary | ICD-10-CM

## 2023-01-10 NOTE — Therapy (Signed)
OUTPATIENT OCCUPATIONAL THERAPY NEURO EVALUATION  [name] MRN: [id number] DOB:12-24-1960, 62 y.o., male Today's Date: 01/10/2023  PCP: Billee Cashing, MD REFERRING PROVIDER: Terisa Starr, MD  END OF SESSION:  OT End of Session - 01/10/23 0929     Visit Number 3    Number of Visits 24    Date for OT Re-Evaluation 03/27/23    OT Stop Time 1015    Activity Tolerance Patient tolerated treatment well    Behavior During Therapy [facility] for tasks assessed/performed             Past Medical History:  Diagnosis Date   Depression    Hypertension    MGUS (monoclonal gammopathy of unknown significance)    Pervasive developmental disorder    Seizures (HCC)    SSS (sick sinus syndrome) (HCC)    PPM placed   Past Surgical History:  Procedure Laterality Date   PACEMAKER IMPLANT     Patient Active Problem List   Diagnosis Date Noted   Vertebral artery stenosis 12/18/2022   Left-sided weakness 12/17/2022   MGUS (monoclonal gammopathy of unknown significance) 07/07/2021   Multifocal pneumonia 12/20/2018   Seizure disorder (HCC) 12/20/2018   COVID-19 virus infection 12/20/2018   Acute respiratory failure with hypoxia (HCC) 12/20/2018   Essential hypertension 12/20/2018   Hyponatremia 12/20/2018   Pancytopenia (HCC) 12/20/2018   Developmental delay disorder 12/20/2018   Pneumonia due to COVID-19 virus 12/20/2018   CKD (chronic kidney disease) stage 3, GFR 30-59 ml/min (HCC) 12/20/2018   Age-related nuclear cataract of both eyes 07/18/2017   Early dry stage nonexudative age-related macular degeneration of both eyes 07/18/2017   Myopia with astigmatism and presbyopia, bilateral 07/18/2017   Onychomycosis 06/29/2016   Risk for falls 02/10/2015   Pacemaker 11/06/2014   Septic shock (HCC) 11/06/2014   Dysarthria 10/31/2014   Altered mental status 10/30/2014   Thrombocytopenia (HCC) 10/08/2014   Medication monitoring encounter 04/24/2014   Tremor 04/24/2014    Unsteadiness on feet 04/24/2014   History of nonmelanoma skin cancer 07/22/2013   Colon polyps 07/09/2013   Enuresis, primary, functional 05/09/2013   Gait disturbance 04/17/2013   Hemorrhoid 04/17/2013   Intention tremor 04/17/2013   Actinic keratosis 05/27/2010   Major depressive disorder, single episode, severe, with psychotic behavior (HCC) 03/21/2010   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   Pervasive developmental disorder 03/21/2010   Obsessive-compulsive disorder 03/07/2010   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   Obesity due to excess calories 02/25/2010    ONSET DATE: 12/16/2022  REFERRING DIAG: Left sided weakness  THERAPY DIAG:  Muscle weakness (generalized)  Other lack of coordination  Rationale for Evaluation and Treatment: Rehabilitation  SUBJECTIVE:   SUBJECTIVE STATEMENT: Pt. was accompanied by his personal caregiver, Britta Mccreedy Pt accompanied by:  Personal caregiver, Britta Mccreedy  PERTINENT HISTORY:   Pt. is a 62 y.o. male who was admitted to the hospital with Left sided weakness, and left sided weakness, vertebral artery stenosis, sick sinus syndrome, Essential tremor, Pervasive Developmental Delay Disorder, Pacemaker implant s/p 6-7 years per caregiver. Pt. Resides at [facility], and attends [facility].   PRECAUTIONS: ICD/Pacemaker 6-7 years, Fall, Pureed diet, with sipping cup  WEIGHT BEARING RESTRICTIONS: No  PAIN:  Are you having pain? No  FALLS: Has patient fallen in last 6 months? Yes. Number of falls 2  LIVING ENVIRONMENT: Lives with: [name] Lives in: No steps to enter Has following equipment at home: Dan Humphreys - 2 wheeled and shower chair  PLOF: Independent with basic ADLs  PATIENT GOALS:    To be able to walk like everyone else  OBJECTIVE:  Note: Objective measures were completed at Evaluation unless otherwise noted.  HAND DOMINANCE: Left  ADLs:  Transfers/ambulation related to ADLs: Eating:  Independent uses a sipping cup, pureed diet Grooming: Independent with brushing teeth, caregiver assist with shaving UB Dressing: MinA LB Dressing: Supervision Toileting: Caregiver assist Bathing: Caregiver assist Tub Shower transfers: MinA Equipment: none  IADLs: Shopping: N/A Light housekeeping:  Caregiver Assist now Meal Prep: Pureed/ Group Home prepares all meals Community mobility:  Relies on transportation services Medication management: Caregivers provide medication management Financial management: Caregivers Assist Handwriting: 50% legible  MOBILITY STATUS: Needs Assist: RW and Hx of falls  POSTURE COMMENTS:  Leans to the left in supported  sitting   ACTIVITY TOLERANCE: Activity tolerance: Limited  FUNCTIONAL OUTCOME MEASURES: FOTO: 73  UPPER EXTREMITY ROM:    Active ROM Right eval Left eval  Shoulder flexion 74(100) 80(122)  Shoulder abduction 73(94) 70(98)  Shoulder adduction    Shoulder extension    Shoulder internal rotation    Shoulder external rotation    Elbow flexion [facility] WFL  Elbow extension [facility] WFL  Wrist flexion [facility] WFL  Wrist extension [facility] WFL  Wrist ulnar deviation    Wrist radial deviation    Wrist pronation    Wrist supination    (Blank rows = not tested)  UPPER EXTREMITY MMT:     MMT Right eval Left eval  Shoulder flexion 3-/5 3-/5  Shoulder abduction 3-/5 3-/5  Shoulder adduction    Shoulder extension    Shoulder internal rotation    Shoulder external rotation    Middle trapezius    Lower trapezius    Elbow flexion 4/5 4/5  Elbow extension 4-/5 4-/5  Wrist flexion 4-/5 4-/5  Wrist extension 4-/5 4-/5  Wrist ulnar deviation    Wrist radial deviation    Wrist pronation    Wrist supination    (Blank rows = not tested)  HAND FUNCTION: Grip strength: Right: 38 lbs; Left: 27 lbs, Lateral pinch: Right: 13 lbs, Left: 14 lbs, and 3 point pinch: Right: 16 lbs, Left: 13 lbs  COORDINATION: 9 Hole Peg test: Right: 55 sec; Left:  47 sec  SENSATION: Light Touch: TBD Proprioception: TBD  EDEMA: N/A  MUSCLE TONE: Ataxia  COGNITION: Overall cognitive status: Within functional limits for tasks assessed   Caregiver reports becomes agitated more easily    VISION ASSESSMENT:   Glasses  PERCEPTION: Intact  PRAXIS: Impaired: Motor planning   TODAY'S TREATMENT:                                                                                                                              DATE: 01/10/2023   Therapeutic Activity:  Pt performed [facility] tasks using the Grooved pegboard. Pt worked on grasping the grooved pegs from a horizontal position and worked on Comptroller, moving  the pegs to a vertical position in the hand to prepare for placing them in the grooved slot. Pt removed pegs one at a time storing 16 in palm without dropping. Attempted to remove one at a time from hand, removed 2 at a time with focus in thumb extension. Pt focused on improving [facility] with manipulating nuts and bolts positioned vertically. Pt unscrewed both the 1" and 1/2" nuts with step by step cues for hand placement. Pt worked on picking up the nuts from the table and placing them back onto the bolt. Pt. Had more difficulty grasping and placing the nuts into position and alignment with vision occluded. Pt. required increased time and cues for hand control during the task.  Therapeutic Exercise:   Pt performed gross gripping with grip strengthener. Pt worked on sustaining grip while grasping pegs and reaching at various heights with gripper set to  28.9# of grip strength resistance to remove pegs x 2 trials.      PATIENT EDUCATION: Education details: OT services, POC, goals Person educated: Patient and Caregiver Administrator: Explanation, Demonstration, and Verbal cues Education comprehension: verbalized understanding, returned demonstration, verbal cues required, and needs further education  HOME EXERCISE PROGRAM:   To  continue to assess, and provide as needed   GOALS: Goals reviewed with patient? Yes  SHORT TERM GOALS: Target date: 02/13/2023    Pt. Will require supervision with HEPs for BE UEs. Baseline: Eval: No current HEP Goal status: INITIAL  LONG TERM GOALS: Target date: 03/27/2023    Pt. Will assume upright sitting with with minimal cues for the duration of a 15 min. Task.  Baseline: Pt. with positive left sided leaning. Goal status: INITIAL  2.  Pt. Will increase bilateral shoulder flexion ROM by 10 degrees to assist with bathing/washing his hair Baseline: Eval: shoulder flexion: Right: 74(100) Left: 80(122)  Goal status: INITIAL  3.  Pt. Will improve bilateral shoulder abduction ROM by 10 degrees to assist with UE dressing skills. Baseline: Eval: shoulder abduction: Right: 73(94), Left: 70(98) Goal status: INITIAL  4.  Pt. Will improve left grip strength by 5# to be able to hold items securely Baseline: Eval: Right: 38# Left: 27# Goal status: INITIAL  5.  Pt. Will improve [facility] skills by 10 sec to be able to independently, and efficiently manipulate small items. Baseline: Eval: Left 55 sec Goal status: INITIAL  6.  Pt. will increase FOTO score by 2 points to reflect Pt. perceived improvement with assessment specific ADL/IADL's.  Baseline: Eval: 73 Goal status: INITIAL  ASSESSMENT:  CLINICAL IMPRESSION:  Pt requires increased time and cues for technique to complete grooved peg board and screw/unscrew 1", 1/2" nuts, significantly increased difficulty replacing nuts, cues to locate correct bolt with vision occluded Tolerated increased gripper weight to 28.9#. Pt. was previously independent with basic ADL care tasks, however now requires assist from caregivers. Pt. will benefit from OT services for ADL training, A/E training, UE there. Ex., neuromuscular re-education, and pt./caregiver education.  PERFORMANCE DEFICITS: in functional skills including ADLs, IADLs, coordination,  dexterity, ROM, strength, Fine motor control, Gross motor control, balance, and UE functional use, cognitive skills including problem solving and safety awareness, and psychosocial skills including coping strategies, environmental adaptation, interpersonal interactions, and routines and behaviors.   IMPAIRMENTS: are limiting patient from ADLs, IADLs, work, play, leisure, and social participation.   CO-MORBIDITIES: has co-morbidities such as Pervasive Developmental Delay Disorder  that affects occupational performance. Patient will benefit from skilled OT to address above impairments  and improve overall function.  MODIFICATION OR ASSISTANCE TO COMPLETE EVALUATION: Min-Moderate modification of tasks or assist with assess necessary to complete an evaluation.  OT OCCUPATIONAL PROFILE AND HISTORY: Detailed assessment: Review of records and additional review of physical, cognitive, psychosocial history related to current functional performance.  CLINICAL DECISION MAKING: Moderate - several treatment options, min-mod task modification necessary  REHAB POTENTIAL: Good  EVALUATION COMPLEXITY: Moderate    PLAN:  OT FREQUENCY: 2x/week  OT DURATION: 12 weeks  PLANNED INTERVENTIONS: 97535 self care/ADL training, 16109 therapeutic exercise, 97530 therapeutic activity, 97112 neuromuscular re-education, 97140 manual therapy, 97010 moist heat, 97034 contrast bath, balance training, patient/family education, and DME and/or AE instructions  RECOMMENDED OTHER SERVICES: PT & ST  CONSULTED AND AGREED WITH PLAN OF CARE: Patient and family member/caregiver  PLAN FOR NEXT SESSION: see above    Kathie Dike, M.S. OTR/L  01/10/23, 9:31 AM  ascom [phone]  01/10/2023, 9:31 AM

## 2023-01-10 NOTE — Therapy (Signed)
OUTPATIENT PHYSICAL THERAPY NEURO TREATMENT   [name] MRN: [id number] DOB:[birth year], 62 y.o., male Today's Date: 01/10/2023   PCP: Domenic Schwab. Danae Chen, MD REFERRING PROVIDER: Rodney Cruise, MD  END OF SESSION:  PT End of Session - 01/10/23 1026     Visit Number 3    Number of Visits 24    Date for PT Re-Evaluation 03/27/23    Authorization Type Medicare primary; Medicaid secondary    Authorization Time Period 01/02/23-03/27/23    PT Start Time 0857    PT Stop Time 0928    PT Time Calculation (min) 31 min    Equipment Utilized During Treatment Gait belt    Activity Tolerance Patient tolerated treatment well    Behavior During Therapy WFL for tasks assessed/performed              Past Medical History:  Diagnosis Date   Depression    Hypertension    MGUS (monoclonal gammopathy of unknown significance)    Pervasive developmental disorder    Seizures (HCC)    SSS (sick sinus syndrome) (HCC)    PPM placed   Past Surgical History:  Procedure Laterality Date   PACEMAKER IMPLANT     Patient Active Problem List   Diagnosis Date Noted   Vertebral artery stenosis 12/18/2022   Left-sided weakness 12/17/2022   MGUS (monoclonal gammopathy of unknown significance) 07/07/2021   Multifocal pneumonia 12/20/2018   Seizure disorder (HCC) 12/20/2018   COVID-19 virus infection 12/20/2018   Acute respiratory failure with hypoxia (HCC) 12/20/2018   Essential hypertension 12/20/2018   Hyponatremia 12/20/2018   Pancytopenia (HCC) 12/20/2018   Developmental delay disorder 12/20/2018   Pneumonia due to COVID-19 virus 12/20/2018   CKD (chronic kidney disease) stage 3, GFR 30-59 ml/min (HCC) 12/20/2018   Age-related nuclear cataract of both eyes 07/18/2017   Early dry stage nonexudative age-related macular degeneration of both eyes 07/18/2017   Myopia with astigmatism and presbyopia, bilateral 07/18/2017   Onychomycosis 06/29/2016   Risk for falls 02/10/2015    Pacemaker 11/06/2014   Septic shock (HCC) 11/06/2014   Dysarthria 10/31/2014   Altered mental status 10/30/2014   Thrombocytopenia (HCC) 10/08/2014   Medication monitoring encounter 04/24/2014   Tremor 04/24/2014   Unsteadiness on feet 04/24/2014   History of nonmelanoma skin cancer 07/22/2013   Colon polyps 07/09/2013   Enuresis, primary, functional 05/09/2013   Gait disturbance 04/17/2013   Hemorrhoid 04/17/2013   Intention tremor 04/17/2013   Actinic keratosis 05/27/2010   Major depressive disorder, single episode, severe, with psychotic behavior (HCC) 03/21/2010   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   Pervasive developmental disorder 03/21/2010   Obsessive-compulsive disorder 03/07/2010   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   Obesity due to excess calories 02/25/2010    ONSET DATE: November 2024  REFERRING DIAG:  Diagnosis  G45.9 (ICD-10-CM) - TIA (transient ischemic attack)    THERAPY DIAG:  Muscle weakness (generalized)  Difficulty in walking, not elsewhere classified  Unsteadiness on feet  Rationale for Evaluation and Treatment: Rehabilitation  SUBJECTIVE:  SUBJECTIVE STATEMENT: Pt reports no pain currently. Reports he just woke up very late this morning, is sleepy. He reports 1 fall since last seen and presents with bruise on L temple and R dorsal hand. He says he fell and bumped his head on the sink/hit his hand trying to catch himself, and thinks this happened a week ago. He reports no updates or other changes/concerns. He did not do HEP and would like a new copy of his HEP.  Pt accompanied by:  self  PERTINENT HISTORY:  From eval  Pt is a 62 year old male presenting to physical therapy for issues concerning balance and strength. At baseline, pt has a history of  intellectual disabilities and lives in a group home. Pt is accompanied by his caretaker where they report he had a "slight stroke" early November where he spent a couple of days at [facility] on the third floor. According to ED notes from his stay (11/9-11/11), pt was brought in after a syncopal episode where they found no evidence of stroke on the CT, but significant vertebral artery stenosis. Prior to this recent admission, he was living in a group home and independent with most ADL's including getting himself dressed and bathing. Since then, he now requires assistance with basic ADL's and uses a two wheeled rolling walker for ambulation. He reports his left side is now "weaker" since his hospital stay. In the last six months, he has had two falls with both taking place in the bathroom. His caretaker reports one fall was when he was attempting to get out of the shower and hit his head on the sink. He has had two hospital admissions over the last year both related to falls. His typical day consists of going to school at the group home and watching television. He enjoys looking at antique shops in his free time and enjoys home improvement shows.   PMH: sick sinus syndrome, vertebral artery stenosis, pacemaker implant, I/DD, hypertension, and seizure disorder. Pt is referred to OPPT after recent increase in falls, syncope, concern for TIA. According to ED notes from his stay (11/9-11/11), pt was brought in after a syncopal episode where they found no evidence of stroke on the CT, but significant vertebral artery stenosis. While in the ED, pacemaker interrogated, ruled out. At baseline pt was living in a group home and independent with most ADL including getting himself dressed and bathing. Since then, he now requires assistance with basic ADL and uses a two wheeled rolling walker for ambulation. Several ED visits since October due to falls. Pt enjoys looking at antique shops in his free time and watching home  improvement shows.    PAIN:  Are you having pain? No and pt reports achy feeling in the morning   PRECAUTIONS: Fall  WEIGHT BEARING RESTRICTIONS: No  FALLS: Has patient fallen in last 6 months? Yes. Number of falls 2 Pt's caretaker reports one fall when trying to get out of the shower where he hit his head on the sink.   LIVING ENVIRONMENT: Lives with: lives in an adult home Lives in: Group home Stairs: No  Has following equipment at home: Dan Humphreys - 2 wheeled and shower chair rolling wl and shower chair   PLOF: Needs assistance with ADLs   PATIENT GOALS: work on stairs, walk like he used to  OBJECTIVE:  Note: Objective measures were completed at Evaluation unless otherwise noted.  DIAGNOSTIC FINDINGS:   MRI HEAD WITHOUT CONTRAST- 12/18/2022  COMPARISON:  Head CT December 16, 2022.   FINDINGS: Brain: Scattered foci no acute infarction, hemorrhage, hydrocephalus, extra-axial collection or mass lesion. Scattered foci T2 hyperintensity are seen within the white matter of the cerebral hemispheres, nonspecific. Mild parenchymal volume loss.   IMPRESSION: 1. No acute intracranial abnormality. 2. Scattered foci of T2 hyperintensity within the white matter of the cerebral hemispheres, nonspecific but may represent mild chronic microvascular ischemic changes. 3. Mild parenchymal volume loss.    VITALS BP: 107/67 mmHg in seated HR: 70 bpm   COGNITION: Overall cognitive status: History of cognitive impairments - at baseline   SENSATION: Not tested  POSTURE: forward head and increased thoracic kyphosis   LOWER EXTREMITY MMT:    MMT Right Eval Left Eval  Hip flexion 4 4-  Hip extension    Hip abduction 4-** 4-**  Hip adduction 4* 4*  Hip internal rotation    Hip external rotation    Knee flexion 3+ 3  Knee extension 3+ 3  Ankle dorsiflexion 4 3+  Ankle plantarflexion    Ankle inversion    Ankle eversion    (Blank rows = not tested) **Hip abduction and  adduction tested in seated  TRANSFERS: Assistive device utilized: None  Sit to stand: Complete Independence Stand to sit: Complete Independence Chair to chair: Complete Independence   STAIRS: Assess at next visit   GAIT: Gait pattern: two point step to with rolling walker, decreased stride length, Right foot flat, Left foot flat, trunk flexed, narrow BOS, poor foot clearance- Right, and poor foot clearance- Left Distance walked: >500 ft Assistive device utilized: Environmental consultant - 2 wheeled Level of assistance: SBA for safety  Comments: forward flexed trunk, increased adduction of LE, path deviation to the left, when completing turns pt lifts walker off the ground, narrow BOS, left lateral lean , genuvarus of right LE dynamic in mid stance , trendelenburg on right hip for ligamentous lateral stability in midstance, gait deviations dramatically more deviated after 4 minutes of ambulation resultant cross-over and catching of toes on contralateral heel in swing phase bilaterally   FUNCTIONAL TESTS:  5 times sit to stand: 29.3 with UE on thigh   Timed up and go (TUG): 27.96 with two wheeled walker, 34.21 without AD 6 minute walk test: 520 ft with rolling walker, increased fatigue  Berg Balance Scale:.36/56  PATIENT SURVEYS:  FOTO 1 - completed with assistance from SPT and caretaker   TODAY'S TREATMENT:                                                                                                                              DATE: 01/10/23    TA: Re-issued printout of HEP per pt request   Review of HEP for safe technique with home use and proper understanding of technique: STS hands free 3x10  Standing march with UE support 3x10 each LE Standing hip abd with UE support 3x10 each LE Comments: Pt able to demo correct technique throughout with cuing,  but will likely require further instruction future visits  Instruction in safe transfer with RW back to chair - pt tendency to want to leave RW  and turn to get back into his chair, additional instruction to take RW with him/stay near walker when turning. 2x   TE: other interventions for LE strengthening Seated march 10x each LE LAQ 10x each LE Seated heel raise 10x each LE    PATIENT EDUCATION: Education details: POC, Goals,  Person educated: Patient and Caregiver   Education method: Explanation Education comprehension: verbalized understanding and needs further education  HOME EXERCISE PROGRAM: Access Code: [email or web address] URL: [email or web address] Date: 01/08/2023 Prepared by: Grier Rocher  Exercises - Standing March with Counter Support  - 1 x daily - 7 x weekly - 3 sets - 10 reps - 2 hold - Standing Hip Abduction with Counter Support  - 1 x daily - 7 x weekly - 3 sets - 10 reps - 3 hold - Sit to Stand with Arms Crossed  - 1 x daily - 7 x weekly - 3 sets - 10 reps  GOALS: Goals reviewed with patient? Yes  SHORT TERM GOALS: Target date: 02/13/2023  Patient will be independent in home exercise program to improve strength/mobility for better functional independence with ADLs. Baseline: To be provided next visit  Goal status: INITIAL  2.  N/A Baseline:  Goal status: INITIAL    LONG TERM GOALS: Target date: 03/27/23  Patient will increase FOTO score to equal to or greater than   71  to demonstrate statistically significant improvement in mobility and quality of life.  Baseline: 58 Goal status: INITIAL  2.  Patient (> 60 years old) will complete five times sit to stand test in < 15 seconds indicating an increased LE strength and improved balance. Baseline: 29.33 Goal status: INITIAL  3.  Patient will increase six minute walk test distance to >1000 for progression to community ambulator and improve gait ability Baseline: 520 ft with walker Goal status: INITIAL  4.  Patient will reduce timed up and go to <15 seconds to reduce fall risk and demonstrate improved transfer/gait ability. Baseline: 27.96  with walker, 34.21 without AD Goal status: INITIAL  5.  Patient will increase Berg Balance score by > 6 points to demonstrate decreased fall risk during functional activities. Baseline: 36 Goal status: INITIAL  6.  Patient will ascend/descend 5 stairs with least restrictive assistive device without loss of balance to improve functional mobility.   Baseline: Assess next visit  Goal status: INITIAL  ASSESSMENT:  CLINICAL IMPRESSION: Session limited due pt late arrival (pt takes transportation). PT reviewed HEP for correct technique and provided new printout per pt request. PT also instructed pt in safe transfer technique as he has tendency to leave RW and try to turn without UE support and ambulate back into chair. Plan to reinforce this instruction future visits. Pt will continue to benefit from skilled physical therapy decrease risk of falls, improve quality of life, and maximize function.     OBJECTIVE IMPAIRMENTS: Abnormal gait, decreased activity tolerance, decreased balance, decreased cognition, decreased coordination, decreased endurance, decreased knowledge of use of DME, decreased mobility, difficulty walking, decreased strength, decreased safety awareness, and improper body mechanics.   ACTIVITY LIMITATIONS: carrying, lifting, bending, standing, squatting, stairs, bathing, toileting, and dressing  PARTICIPATION LIMITATIONS: meal prep, cleaning, laundry, medication management, personal finances, driving, shopping, community activity, occupation, and yard work  PERSONAL FACTORS: Behavior pattern and 3+ comorbidities: sick sinus syndrome, vertebral artery stenosis, pacemaker implant,  developmental delay disorder, hypertension, and seizure disorder.  are also affecting patient's functional outcome.  REHAB POTENTIAL: Good  CLINICAL DECISION MAKING: Evolving/moderate complexity  EVALUATION COMPLEXITY: Moderate  PLAN:  PT FREQUENCY: 1-2x/week  PT DURATION: 12 weeks  PLANNED  INTERVENTIONS: 97110-Therapeutic exercises, 97530- Therapeutic activity, 97112- Neuromuscular re-education, 97535- Self Care, 16109- Manual therapy, [phone]- Gait training, [phone]- Orthotic Fit/training, [phone]- Electrical stimulation (unattended), Patient/Family education, Balance training, Stair training, Joint mobilization, Cryotherapy, and Moist heat  PLAN FOR NEXT SESSION:   develop HEP, LE strengthening dynamic balance and gait without AD    Baird Kay, PT 01/10/2023, 10:31 AM  Temple Pacini PT, DPT  Physical Therapist - [facility]  10:31 AM 01/10/23

## 2023-01-15 ENCOUNTER — Ambulatory Visit: Payer: Medicare Other | Admitting: Occupational Therapy

## 2023-01-15 ENCOUNTER — Ambulatory Visit: Payer: Medicare Other

## 2023-01-15 DIAGNOSIS — R2681 Unsteadiness on feet: Secondary | ICD-10-CM

## 2023-01-15 DIAGNOSIS — R2689 Other abnormalities of gait and mobility: Secondary | ICD-10-CM

## 2023-01-15 DIAGNOSIS — M6281 Muscle weakness (generalized): Secondary | ICD-10-CM | POA: Diagnosis not present

## 2023-01-15 DIAGNOSIS — R262 Difficulty in walking, not elsewhere classified: Secondary | ICD-10-CM

## 2023-01-15 DIAGNOSIS — R278 Other lack of coordination: Secondary | ICD-10-CM

## 2023-01-15 NOTE — Therapy (Signed)
OUTPATIENT OCCUPATIONAL THERAPY NEURO TREATMENT NOTE  [name] MRN: [id number] DOB:Nov 06, 1960, 62 y.o., male Today's Date: 01/15/2023  PCP: Billee Cashing, MD REFERRING PROVIDER: Terisa Starr, MD  END OF SESSION:  OT End of Session - 01/15/23 1201     Visit Number 4    Date for OT Re-Evaluation 03/27/23    OT Start Time 0845    OT Stop Time 0930    OT Time Calculation (min) 45 min    Activity Tolerance Patient tolerated treatment well    Behavior During Therapy [facility] for tasks assessed/performed             Past Medical History:  Diagnosis Date   Depression    Hypertension    MGUS (monoclonal gammopathy of unknown significance)    Pervasive developmental disorder    Seizures (HCC)    SSS (sick sinus syndrome) (HCC)    PPM placed   Past Surgical History:  Procedure Laterality Date   PACEMAKER IMPLANT     Patient Active Problem List   Diagnosis Date Noted   Vertebral artery stenosis 12/18/2022   Left-sided weakness 12/17/2022   MGUS (monoclonal gammopathy of unknown significance) 07/07/2021   Multifocal pneumonia 12/20/2018   Seizure disorder (HCC) 12/20/2018   COVID-19 virus infection 12/20/2018   Acute respiratory failure with hypoxia (HCC) 12/20/2018   Essential hypertension 12/20/2018   Hyponatremia 12/20/2018   Pancytopenia (HCC) 12/20/2018   Developmental delay disorder 12/20/2018   Pneumonia due to COVID-19 virus 12/20/2018   CKD (chronic kidney disease) stage 3, GFR 30-59 ml/min (HCC) 12/20/2018   Age-related nuclear cataract of both eyes 07/18/2017   Early dry stage nonexudative age-related macular degeneration of both eyes 07/18/2017   Myopia with astigmatism and presbyopia, bilateral 07/18/2017   Onychomycosis 06/29/2016   Risk for falls 02/10/2015   Pacemaker 11/06/2014   Septic shock (HCC) 11/06/2014   Dysarthria 10/31/2014   Altered mental status 10/30/2014   Thrombocytopenia (HCC) 10/08/2014   Medication monitoring  encounter 04/24/2014   Tremor 04/24/2014   Unsteadiness on feet 04/24/2014   History of nonmelanoma skin cancer 07/22/2013   Colon polyps 07/09/2013   Enuresis, primary, functional 05/09/2013   Gait disturbance 04/17/2013   Hemorrhoid 04/17/2013   Intention tremor 04/17/2013   Actinic keratosis 05/27/2010   Major depressive disorder, single episode, severe, with psychotic behavior (HCC) 03/21/2010   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   Pervasive developmental disorder 03/21/2010   Obsessive-compulsive disorder 03/07/2010   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 was accompanied by his personal caregiver, [name] Pt accompanied by:  Personal caregiver, Britta Mccreedy  PERTINENT HISTORY:   Pt. is a 62 y.o. male who was admitted to the hospital with Left sided weakness, and left sided weakness, vertebral artery stenosis, sick sinus syndrome, Essential tremor, Pervasive Developmental Delay Disorder, Pacemaker implant s/p 6-7 years per caregiver. Pt. Resides at [facility], and attends [employer] [facility].   PRECAUTIONS: ICD/Pacemaker 6-7 years, Fall, Pureed diet, with sipping cup  WEIGHT BEARING RESTRICTIONS: No  PAIN:  Are you having pain? No  FALLS: Has patient fallen in last 6 months? Yes. Number of falls 2  LIVING ENVIRONMENT: Lives with: [name] Lives in: No steps to enter Has following equipment at home: Dan Humphreys -  2 wheeled and shower chair  PLOF: Independent with basic ADLs  PATIENT GOALS:    To be able to walk like everyone else  OBJECTIVE:  Note: Objective measures were completed at Evaluation unless otherwise noted.  HAND DOMINANCE: Left  ADLs:  Transfers/ambulation related to  ADLs: Eating: Independent uses a sipping cup, pureed diet Grooming: Independent with brushing teeth, caregiver assist with shaving UB Dressing: MinA LB Dressing: Supervision Toileting: Caregiver assist Bathing: Caregiver assist Tub Shower transfers: MinA Equipment: none  IADLs: Shopping: N/A Light housekeeping:  Caregiver Assist now Meal Prep: Pureed/ Group Home prepares all meals Community mobility:  Relies on transportation services Medication management: Caregivers provide medication management Financial management: Caregivers Assist Handwriting: 50% legible  MOBILITY STATUS: Needs Assist: RW and Hx of falls  POSTURE COMMENTS:  Leans to the left in supported  sitting   ACTIVITY TOLERANCE: Activity tolerance: Limited  FUNCTIONAL OUTCOME MEASURES: FOTO: 73  UPPER EXTREMITY ROM:    Active ROM Right eval Left eval  Shoulder flexion 74(100) 80(122)  Shoulder abduction 73(94) 70(98)  Shoulder adduction    Shoulder extension    Shoulder internal rotation    Shoulder external rotation    Elbow flexion [facility] WFL  Elbow extension [facility] WFL  Wrist flexion [facility] WFL  Wrist extension [facility] WFL  Wrist ulnar deviation    Wrist radial deviation    Wrist pronation    Wrist supination    (Blank rows = not tested)  UPPER EXTREMITY MMT:     MMT Right eval Left eval  Shoulder flexion 3-/5 3-/5  Shoulder abduction 3-/5 3-/5  Shoulder adduction    Shoulder extension    Shoulder internal rotation    Shoulder external rotation    Middle trapezius    Lower trapezius    Elbow flexion 4/5 4/5  Elbow extension 4-/5 4-/5  Wrist flexion 4-/5 4-/5  Wrist extension 4-/5 4-/5  Wrist ulnar deviation    Wrist radial deviation    Wrist pronation    Wrist supination    (Blank rows = not tested)  HAND FUNCTION: Grip strength: Right: 38 lbs; Left: 27 lbs, Lateral pinch: Right: 13 lbs, Left: 14 lbs, and 3 point pinch: Right: 16 lbs, Left: 13 lbs  COORDINATION: 9 Hole Peg test: Right:  55 sec; Left: 47 sec  SENSATION: Light Touch: TBD Proprioception: TBD  EDEMA: N/A  MUSCLE TONE: Ataxia  COGNITION: Overall cognitive status: Within functional limits for tasks assessed   Caregiver reports becomes agitated more easily    VISION ASSESSMENT:   Glasses  PERCEPTION: Intact  PRAXIS: Impaired: Motor planning   TODAY'S TREATMENT:                                                                                                                              DATE: 01/15/2023   Therapeutic Exercise:   Pt. performed bilateral gross gripping with a gross grip strengthener. Pt. worked on sustaining grip while grasping pegs  and reaching at various heights. The gripper was initially set to 11.9 # of grip strength resistance for the left hand, and 6.6# of grip strength resistance for the right hand. Pt. worked on pinch strengthening in the left hand for lateral, and 3pt. pinch using yellow, red, green, and blue resistive clips. Pt. worked on placing the clips onto a horizontal dowel. Tactile and verbal cues were required for hand position in order to elicit the desired movement.      PATIENT EDUCATION: Education details:  UE strengthening Person educated: Patient and Child psychotherapist: Explanation, Demonstration, and Verbal cues Education comprehension: verbalized understanding, returned demonstration, verbal cues required, and needs further education  HOME EXERCISE PROGRAM:   To continue to assess, and provide as needed   GOALS: Goals reviewed with patient? Yes  SHORT TERM GOALS: Target date: 02/13/2023    Pt. Will require supervision with HEPs for BE UEs. Baseline: Eval: No current HEP Goal status: INITIAL  LONG TERM GOALS: Target date: 03/27/2023    Pt. Will assume upright sitting with with minimal cues for the duration of a 15 min. Task.  Baseline: Pt. with positive left sided leaning. Goal status: INITIAL  2.  Pt. Will increase bilateral  shoulder flexion ROM by 10 degrees to assist with bathing/washing his hair Baseline: Eval: shoulder flexion: Right: 74(100) Left: 80(122)  Goal status: INITIAL  3.  Pt. Will improve bilateral shoulder abduction ROM by 10 degrees to assist with UE dressing skills. Baseline: Eval: shoulder abduction: Right: 73(94), Left: 70(98) Goal status: INITIAL  4.  Pt. Will improve left grip strength by 5# to be able to hold items securely Baseline: Eval: Right: 38# Left: 27# Goal status: INITIAL  5.  Pt. Will improve [facility] skills by 10 sec to be able to independently, and efficiently manipulate small items. Baseline: Eval: Left 55 sec Goal status: INITIAL  6.  Pt. will increase FOTO score by 2 points to reflect Pt. perceived improvement with assessment specific ADL/IADL's.  Baseline: Eval: 73 Goal status: INITIAL  ASSESSMENT:  CLINICAL IMPRESSION:  Pt. required the grip strength on each hand to be adjusted. Pt. required the left to be modified to accommodate more resistance, and the the right to accommodate less resistance. Pt. Required consistent cues for hand position for 3pt. Pinch strengthening. Requiring more cues for the right hand than the left. Pt. continues to benefit from OT services for ADL training, A/E training, UE there. Ex., neuromuscular re-education, and pt./caregiver education.  PERFORMANCE DEFICITS: in functional skills including ADLs, IADLs, coordination, dexterity, ROM, strength, Fine motor control, Gross motor control, balance, and UE functional use, cognitive skills including problem solving and safety awareness, and psychosocial skills including coping strategies, environmental adaptation, interpersonal interactions, and routines and behaviors.   IMPAIRMENTS: are limiting patient from ADLs, IADLs, work, play, leisure, and social participation.   CO-MORBIDITIES: has co-morbidities such as Pervasive Developmental Delay Disorder  that affects occupational performance. Patient will  benefit from skilled OT to address above impairments and improve overall function.  MODIFICATION OR ASSISTANCE TO COMPLETE EVALUATION: Min-Moderate modification of tasks or assist with assess necessary to complete an evaluation.  OT OCCUPATIONAL PROFILE AND HISTORY: Detailed assessment: Review of records and additional review of physical, cognitive, psychosocial history related to current functional performance.  CLINICAL DECISION MAKING: Moderate - several treatment options, min-mod task modification necessary  REHAB POTENTIAL: Good  EVALUATION COMPLEXITY: Moderate    PLAN:  OT FREQUENCY: 2x/week  OT DURATION: 12 weeks  PLANNED INTERVENTIONS: 82956  self care/ADL training, 97110 therapeutic exercise, 97530 therapeutic activity, 97112 neuromuscular re-education, 97140 manual therapy, 97010 moist heat, 97034 contrast bath, balance training, patient/family education, and DME and/or AE instructions  RECOMMENDED OTHER SERVICES: PT & ST  CONSULTED AND AGREED WITH PLAN OF CARE: Patient and family member/caregiver  PLAN FOR NEXT SESSION: see above    Olegario Messier, MS, OTR/L   01/15/2023, 12:08 PM

## 2023-01-15 NOTE — Therapy (Signed)
OUTPATIENT PHYSICAL THERAPY NEURO TREATMENT   [name] MRN: [id number] DOB:[birth year], 62 y.o., male Today's Date: 01/15/2023   PCP: Domenic Schwab. Danae Chen, MD REFERRING PROVIDER: Rodney Cruise, MD  END OF SESSION:  PT End of Session - 01/15/23 0825     Visit Number 4    Number of Visits 24    Date for PT Re-Evaluation 03/27/23    Authorization Type Medicare primary; Medicaid secondary    Authorization Time Period 01/02/23-03/27/23    Progress Note Due on Visit 10    PT Start Time 0834    PT Stop Time 0918    PT Time Calculation (min) 44 min    Equipment Utilized During Treatment Gait belt    Activity Tolerance Patient tolerated treatment well    Behavior During Therapy WFL for tasks assessed/performed              Past Medical History:  Diagnosis Date   Depression    Hypertension    MGUS (monoclonal gammopathy of unknown significance)    Pervasive developmental disorder    Seizures (HCC)    SSS (sick sinus syndrome) (HCC)    PPM placed   Past Surgical History:  Procedure Laterality Date   PACEMAKER IMPLANT     Patient Active Problem List   Diagnosis Date Noted   Vertebral artery stenosis 12/18/2022   Left-sided weakness 12/17/2022   MGUS (monoclonal gammopathy of unknown significance) 07/07/2021   Multifocal pneumonia 12/20/2018   Seizure disorder (HCC) 12/20/2018   COVID-19 virus infection 12/20/2018   Acute respiratory failure with hypoxia (HCC) 12/20/2018   Essential hypertension 12/20/2018   Hyponatremia 12/20/2018   Pancytopenia (HCC) 12/20/2018   Developmental delay disorder 12/20/2018   Pneumonia due to COVID-19 virus 12/20/2018   CKD (chronic kidney disease) stage 3, GFR 30-59 ml/min (HCC) 12/20/2018   Age-related nuclear cataract of both eyes 07/18/2017   Early dry stage nonexudative age-related macular degeneration of both eyes 07/18/2017   Myopia with astigmatism and presbyopia, bilateral 07/18/2017   Onychomycosis  06/29/2016   Risk for falls 02/10/2015   Pacemaker 11/06/2014   Septic shock (HCC) 11/06/2014   Dysarthria 10/31/2014   Altered mental status 10/30/2014   Thrombocytopenia (HCC) 10/08/2014   Medication monitoring encounter 04/24/2014   Tremor 04/24/2014   Unsteadiness on feet 04/24/2014   History of nonmelanoma skin cancer 07/22/2013   Colon polyps 07/09/2013   Enuresis, primary, functional 05/09/2013   Gait disturbance 04/17/2013   Hemorrhoid 04/17/2013   Intention tremor 04/17/2013   Actinic keratosis 05/27/2010   Major depressive disorder, single episode, severe, with psychotic behavior (HCC) 03/21/2010   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   Pervasive developmental disorder 03/21/2010   Obsessive-compulsive disorder 03/07/2010   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   Obesity due to excess calories 02/25/2010    ONSET DATE: November 2024  REFERRING DIAG:  Diagnosis  G45.9 (ICD-10-CM) - TIA (transient ischemic attack)    THERAPY DIAG:  Muscle weakness (generalized)  Other lack of coordination  Difficulty in walking, not elsewhere classified  Unsteadiness on feet  Other abnormalities of gait and mobility  Rationale for Evaluation and Treatment: Rehabilitation  SUBJECTIVE:  SUBJECTIVE STATEMENT: Pt reported 0/10 on NPS and no falls or changes since last visit.   Pt accompanied by:  caregiver  PERTINENT HISTORY:  From eval  Pt is a 62 year old male presenting to physical therapy for issues concerning balance and strength. At baseline, pt has a history of intellectual disabilities and lives in a group home. Pt is accompanied by his caretaker where they report he had a "slight stroke" early November where he spent a couple of days at [facility] on the third floor. According to ED notes  from his stay (11/9-11/11), pt was brought in after a syncopal episode where they found no evidence of stroke on the CT, but significant vertebral artery stenosis. Prior to this recent admission, he was living in a group home and independent with most ADL's including getting himself dressed and bathing. Since then, he now requires assistance with basic ADL's and uses a two wheeled rolling walker for ambulation. He reports his left side is now "weaker" since his hospital stay. In the last six months, he has had two falls with both taking place in the bathroom. His caretaker reports one fall was when he was attempting to get out of the shower and hit his head on the sink. He has had two hospital admissions over the last year both related to falls. His typical day consists of going to school at the group home and watching television. He enjoys looking at antique shops in his free time and enjoys home improvement shows.   PMH: sick sinus syndrome, vertebral artery stenosis, pacemaker implant, I/DD, hypertension, and seizure disorder. Pt is referred to OPPT after recent increase in falls, syncope, concern for TIA. According to ED notes from his stay (11/9-11/11), pt was brought in after a syncopal episode where they found no evidence of stroke on the CT, but significant vertebral artery stenosis. While in the ED, pacemaker interrogated, ruled out. At baseline pt was living in a group home and independent with most ADL including getting himself dressed and bathing. Since then, he now requires assistance with basic ADL and uses a two wheeled rolling walker for ambulation. Several ED visits since October due to falls. Pt enjoys looking at antique shops in his free time and watching home improvement shows.    PAIN:  Are you having pain? No and pt reports achy feeling in the morning   PRECAUTIONS: Fall  WEIGHT BEARING RESTRICTIONS: No  FALLS: Has patient fallen in last 6 months? Yes. Number of falls 2 Pt's  caretaker reports one fall when trying to get out of the shower where he hit his head on the sink.   LIVING ENVIRONMENT: Lives with: lives in an adult home Lives in: Group home Stairs: No  Has following equipment at home: Dan Humphreys - 2 wheeled and shower chair rolling wl and shower chair   PLOF: Needs assistance with ADLs   PATIENT GOALS: work on stairs, walk like he used to  OBJECTIVE:  Note: Objective measures were completed at Evaluation unless otherwise noted.  DIAGNOSTIC FINDINGS:   MRI HEAD WITHOUT CONTRAST- 12/18/2022  COMPARISON:  Head CT December 16, 2022.   FINDINGS: Brain: Scattered foci no acute infarction, hemorrhage, hydrocephalus, extra-axial collection or mass lesion. Scattered foci T2 hyperintensity are seen within the white matter of the cerebral hemispheres, nonspecific. Mild parenchymal volume loss.   IMPRESSION: 1. No acute intracranial abnormality. 2. Scattered foci of T2 hyperintensity within the white matter of the cerebral hemispheres, nonspecific but may represent mild chronic  microvascular ischemic changes. 3. Mild parenchymal volume loss.    VITALS BP: 107/67 mmHg in seated HR: 70 bpm   COGNITION: Overall cognitive status: History of cognitive impairments - at baseline   SENSATION: Not tested  POSTURE: forward head and increased thoracic kyphosis   LOWER EXTREMITY MMT:    MMT Right Eval Left Eval  Hip flexion 4 4-  Hip extension    Hip abduction 4-** 4-**  Hip adduction 4* 4*  Hip internal rotation    Hip external rotation    Knee flexion 3+ 3  Knee extension 3+ 3  Ankle dorsiflexion 4 3+  Ankle plantarflexion    Ankle inversion    Ankle eversion    (Blank rows = not tested) **Hip abduction and adduction tested in seated  TRANSFERS: Assistive device utilized: None  Sit to stand: Complete Independence Stand to sit: Complete Independence Chair to chair: Complete Independence   STAIRS: Assess at next visit   GAIT: Gait  pattern: two point step to with rolling walker, decreased stride length, Right foot flat, Left foot flat, trunk flexed, narrow BOS, poor foot clearance- Right, and poor foot clearance- Left Distance walked: >500 ft Assistive device utilized: Environmental consultant - 2 wheeled Level of assistance: SBA for safety  Comments: forward flexed trunk, increased adduction of LE, path deviation to the left, when completing turns pt lifts walker off the ground, narrow BOS, left lateral lean , genuvarus of right LE dynamic in mid stance , trendelenburg on right hip for ligamentous lateral stability in midstance, gait deviations dramatically more deviated after 4 minutes of ambulation resultant cross-over and catching of toes on contralateral heel in swing phase bilaterally   FUNCTIONAL TESTS:  5 times sit to stand: 29.3 with UE on thigh   Timed up and go (TUG): 27.96 with two wheeled walker, 34.21 without AD 6 minute walk test: 520 ft with rolling walker, increased fatigue  Berg Balance Scale:.36/56  PATIENT SURVEYS:  FOTO 58 - completed with assistance from SPT and caretaker   TODAY'S TREATMENT:                                                                                                                              DATE: 01/15/23    TE: Re-issued printout of HEP per pt request   Review of HEP for safe technique with home use and proper understanding of technique: STS hands free 3x10  Standing march with UE support 3x10 each LE Standing hip abd with UE support 3x10 each LE Comments: Pt able to demo correct technique throughout with cuing, but will likely require further instruction future visits  Seated march 2# AW 2x10 each LE LAQ 2# AW 2x10 each LE Seated heel raise 2x10 each LE Ambulation 3x160 feet with RW and CGA  PATIENT EDUCATION: Education details: POC, Goals,  Person educated: Patient and Caregiver   Education method: Explanation Education comprehension: verbalized understanding and needs further  education  HOME EXERCISE  PROGRAM: Access Code: [email or web address] URL: [email or web address] Date: 01/08/2023 Prepared by: Grier Rocher  Exercises - Standing March with Counter Support  - 1 x daily - 7 x weekly - 3 sets - 10 reps - 2 hold - Standing Hip Abduction with Counter Support  - 1 x daily - 7 x weekly - 3 sets - 10 reps - 3 hold - Sit to Stand with Arms Crossed  - 1 x daily - 7 x weekly - 3 sets - 10 reps  GOALS: Goals reviewed with patient? Yes  SHORT TERM GOALS: Target date: 02/13/2023  Patient will be independent in home exercise program to improve strength/mobility for better functional independence with ADLs. Baseline: To be provided next visit  Goal status: INITIAL  2.  N/A Baseline:  Goal status: INITIAL    LONG TERM GOALS: Target date: 03/27/23  Patient will increase FOTO score to equal to or greater than   71  to demonstrate statistically significant improvement in mobility and quality of life.  Baseline: 58 Goal status: INITIAL  2.  Patient (> 38 years old) will complete five times sit to stand test in < 15 seconds indicating an increased LE strength and improved balance. Baseline: 29.33 Goal status: INITIAL  3.  Patient will increase six minute walk test distance to >1000 for progression to community ambulator and improve gait ability Baseline: 520 ft with walker Goal status: INITIAL  4.  Patient will reduce timed up and go to <15 seconds to reduce fall risk and demonstrate improved transfer/gait ability. Baseline: 27.96 with walker, 34.21 without AD Goal status: INITIAL  5.  Patient will increase Berg Balance score by > 6 points to demonstrate decreased fall risk during functional activities. Baseline: 36 Goal status: INITIAL  6.  Patient will ascend/descend 5 stairs with least restrictive assistive device without loss of balance to improve functional mobility.   Baseline: Assess next visit  Goal status: INITIAL  ASSESSMENT:  CLINICAL  IMPRESSION: Pt tolerated all tasks during today's visit. Pt requested a copy of HEP and HEP was reviewed to ensure safety and proper technique. Pt was able to tolerate seated marches and LAQs with 2# AW, as well as ambulation with RW. Pt did not report any pain or concerns after today's visit. Pt will continue to benefit from skilled physical therapy decrease risk of falls, improve quality of life, and maximize function.     OBJECTIVE IMPAIRMENTS: Abnormal gait, decreased activity tolerance, decreased balance, decreased cognition, decreased coordination, decreased endurance, decreased knowledge of use of DME, decreased mobility, difficulty walking, decreased strength, decreased safety awareness, and improper body mechanics.   ACTIVITY LIMITATIONS: carrying, lifting, bending, standing, squatting, stairs, bathing, toileting, and dressing  PARTICIPATION LIMITATIONS: meal prep, cleaning, laundry, medication management, personal finances, driving, shopping, community activity, occupation, and yard work  PERSONAL FACTORS: Behavior pattern and 3+ comorbidities: sick sinus syndrome, vertebral artery stenosis, pacemaker implant, developmental delay disorder, hypertension, and seizure disorder.  are also affecting patient's functional outcome.  REHAB POTENTIAL: Good  CLINICAL DECISION MAKING: Evolving/moderate complexity  EVALUATION COMPLEXITY: Moderate  PLAN:  PT FREQUENCY: 1-2x/week  PT DURATION: 12 weeks  PLANNED INTERVENTIONS: 97110-Therapeutic exercises, 97530- Therapeutic activity, [id number]- Neuromuscular re-education, 97535- Self Care, [id number]- Manual therapy, [phone]- Gait training, [phone]- Orthotic Fit/training, [phone]- Electrical stimulation (unattended), Patient/Family education, Balance training, Stair training, Joint mobilization, Cryotherapy, and Moist heat  PLAN FOR NEXT SESSION:   develop HEP, LE strengthening dynamic balance and gait without AD    Debara Pickett, SPT This entire session  was performed under direct supervision and direction of a licensed Estate agent . I have personally read, edited and approve of the note as written.   Lenda Kelp, PT 01/15/2023, 9:48 AM   Physical Therapist - [facility]  9:48 AM 01/15/23

## 2023-01-17 ENCOUNTER — Ambulatory Visit: Payer: Medicare Other | Admitting: Occupational Therapy

## 2023-01-17 ENCOUNTER — Telehealth: Payer: Self-pay | Admitting: Adult Health

## 2023-01-17 ENCOUNTER — Ambulatory Visit: Payer: Medicare Other

## 2023-01-17 DIAGNOSIS — R2681 Unsteadiness on feet: Secondary | ICD-10-CM

## 2023-01-17 DIAGNOSIS — M6281 Muscle weakness (generalized): Secondary | ICD-10-CM

## 2023-01-17 DIAGNOSIS — R262 Difficulty in walking, not elsewhere classified: Secondary | ICD-10-CM

## 2023-01-17 DIAGNOSIS — R2689 Other abnormalities of gait and mobility: Secondary | ICD-10-CM

## 2023-01-17 DIAGNOSIS — R278 Other lack of coordination: Secondary | ICD-10-CM

## 2023-01-17 NOTE — Telephone Encounter (Signed)
Pt being seen at another practice, request made to cx this appointment

## 2023-01-17 NOTE — Therapy (Signed)
OUTPATIENT PHYSICAL THERAPY NEURO TREATMENT   [name] MRN: [id number] DOB:1960/04/08, 62 y.o., male Today's Date: 01/18/2023   PCP: Domenic Schwab. Danae Chen, MD REFERRING PROVIDER: Rodney Cruise, MD  END OF SESSION:  PT End of Session - 01/17/23 1357     Visit Number 5    Number of Visits 24    Date for PT Re-Evaluation 03/27/23    Authorization Type Medicare primary; Medicaid secondary    Authorization Time Period 01/02/23-03/27/23    Progress Note Due on Visit 10    PT Start Time 1400    PT Stop Time 1444    PT Time Calculation (min) 44 min    Equipment Utilized During Treatment Gait belt    Activity Tolerance Patient tolerated treatment well    Behavior During Therapy WFL for tasks assessed/performed              Past Medical History:  Diagnosis Date   Depression    Hypertension    MGUS (monoclonal gammopathy of unknown significance)    Pervasive developmental disorder    Seizures (HCC)    SSS (sick sinus syndrome) (HCC)    PPM placed   Past Surgical History:  Procedure Laterality Date   PACEMAKER IMPLANT     Patient Active Problem List   Diagnosis Date Noted   Vertebral artery stenosis 12/18/2022   Left-sided weakness 12/17/2022   MGUS (monoclonal gammopathy of unknown significance) 07/07/2021   Multifocal pneumonia 12/20/2018   Seizure disorder (HCC) 12/20/2018   COVID-19 virus infection 12/20/2018   Acute respiratory failure with hypoxia (HCC) 12/20/2018   Essential hypertension 12/20/2018   Hyponatremia 12/20/2018   Pancytopenia (HCC) 12/20/2018   Developmental delay disorder 12/20/2018   Pneumonia due to COVID-19 virus 12/20/2018   CKD (chronic kidney disease) stage 3, GFR 30-59 ml/min (HCC) 12/20/2018   Age-related nuclear cataract of both eyes 07/18/2017   Early dry stage nonexudative age-related macular degeneration of both eyes 07/18/2017   Myopia with astigmatism and presbyopia, bilateral 07/18/2017   Onychomycosis  06/29/2016   Risk for falls 02/10/2015   Pacemaker 11/06/2014   Septic shock (HCC) 11/06/2014   Dysarthria 10/31/2014   Altered mental status 10/30/2014   Thrombocytopenia (HCC) 10/08/2014   Medication monitoring encounter 04/24/2014   Tremor 04/24/2014   Unsteadiness on feet 04/24/2014   History of nonmelanoma skin cancer 07/22/2013   Colon polyps 07/09/2013   Enuresis, primary, functional 05/09/2013   Gait disturbance 04/17/2013   Hemorrhoid 04/17/2013   Intention tremor 04/17/2013   Actinic keratosis 05/27/2010   Major depressive disorder, single episode, severe, with psychotic behavior (HCC) 03/21/2010   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   Pervasive developmental disorder 03/21/2010   Obsessive-compulsive disorder 03/07/2010   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   Obesity due to excess calories 02/25/2010    ONSET DATE: November 2024  REFERRING DIAG:  Diagnosis  G45.9 (ICD-10-CM) - TIA (transient ischemic attack)    THERAPY DIAG:  Muscle weakness (generalized)  Other lack of coordination  Difficulty in walking, not elsewhere classified  Unsteadiness on feet  Other abnormalities of gait and mobility  Rationale for Evaluation and Treatment: Rehabilitation  SUBJECTIVE:  SUBJECTIVE STATEMENT: Pt reported 0/10 on NPS and no falls or changes since last visit.   Pt accompanied by:  caregiver  PERTINENT HISTORY:  From eval  Pt is a 62 year old male presenting to physical therapy for issues concerning balance and strength. At baseline, pt has a history of intellectual disabilities and lives in a group home. Pt is accompanied by his caretaker where they report he had a "slight stroke" early November where he spent a couple of days at [facility] on the third floor. According to ED notes  from his stay (11/9-11/11), pt was brought in after a syncopal episode where they found no evidence of stroke on the CT, but significant vertebral artery stenosis. Prior to this recent admission, he was living in a group home and independent with most ADL's including getting himself dressed and bathing. Since then, he now requires assistance with basic ADL's and uses a two wheeled rolling walker for ambulation. He reports his left side is now "weaker" since his hospital stay. In the last six months, he has had two falls with both taking place in the bathroom. His caretaker reports one fall was when he was attempting to get out of the shower and hit his head on the sink. He has had two hospital admissions over the last year both related to falls. His typical day consists of going to school at the group home and watching television. He enjoys looking at antique shops in his free time and enjoys home improvement shows.   PMH: sick sinus syndrome, vertebral artery stenosis, pacemaker implant, I/DD, hypertension, and seizure disorder. Pt is referred to OPPT after recent increase in falls, syncope, concern for TIA. According to ED notes from his stay (11/9-11/11), pt was brought in after a syncopal episode where they found no evidence of stroke on the CT, but significant vertebral artery stenosis. While in the ED, pacemaker interrogated, ruled out. At baseline pt was living in a group home and independent with most ADL including getting himself dressed and bathing. Since then, he now requires assistance with basic ADL and uses a two wheeled rolling walker for ambulation. Several ED visits since October due to falls. Pt enjoys looking at antique shops in his free time and watching home improvement shows.    PAIN:  Are you having pain? No and pt reports achy feeling in the morning   PRECAUTIONS: Fall  WEIGHT BEARING RESTRICTIONS: No  FALLS: Has patient fallen in last 6 months? Yes. Number of falls 2 Pt's  caretaker reports one fall when trying to get out of the shower where he hit his head on the sink.   LIVING ENVIRONMENT: Lives with: lives in an adult home Lives in: Group home Stairs: No  Has following equipment at home: Dan Humphreys - 2 wheeled and shower chair rolling wl and shower chair   PLOF: Needs assistance with ADLs   PATIENT GOALS: work on stairs, walk like he used to  OBJECTIVE:  Note: Objective measures were completed at Evaluation unless otherwise noted.  DIAGNOSTIC FINDINGS:   MRI HEAD WITHOUT CONTRAST- 12/18/2022  COMPARISON:  Head CT December 16, 2022.   FINDINGS: Brain: Scattered foci no acute infarction, hemorrhage, hydrocephalus, extra-axial collection or mass lesion. Scattered foci T2 hyperintensity are seen within the white matter of the cerebral hemispheres, nonspecific. Mild parenchymal volume loss.   IMPRESSION: 1. No acute intracranial abnormality. 2. Scattered foci of T2 hyperintensity within the white matter of the cerebral hemispheres, nonspecific but may represent mild chronic  microvascular ischemic changes. 3. Mild parenchymal volume loss.    VITALS BP: 107/67 mmHg in seated HR: 70 bpm   COGNITION: Overall cognitive status: History of cognitive impairments - at baseline   SENSATION: Not tested  POSTURE: forward head and increased thoracic kyphosis   LOWER EXTREMITY MMT:    MMT Right Eval Left Eval  Hip flexion 4 4-  Hip extension    Hip abduction 4-** 4-**  Hip adduction 4* 4*  Hip internal rotation    Hip external rotation    Knee flexion 3+ 3  Knee extension 3+ 3  Ankle dorsiflexion 4 3+  Ankle plantarflexion    Ankle inversion    Ankle eversion    (Blank rows = not tested) **Hip abduction and adduction tested in seated  TRANSFERS: Assistive device utilized: None  Sit to stand: Complete Independence Stand to sit: Complete Independence Chair to chair: Complete Independence   STAIRS: Assess at next visit   GAIT: Gait  pattern: two point step to with rolling walker, decreased stride length, Right foot flat, Left foot flat, trunk flexed, narrow BOS, poor foot clearance- Right, and poor foot clearance- Left Distance walked: >500 ft Assistive device utilized: Environmental consultant - 2 wheeled Level of assistance: SBA for safety  Comments: forward flexed trunk, increased adduction of LE, path deviation to the left, when completing turns pt lifts walker off the ground, narrow BOS, left lateral lean , genuvarus of right LE dynamic in mid stance , trendelenburg on right hip for ligamentous lateral stability in midstance, gait deviations dramatically more deviated after 4 minutes of ambulation resultant cross-over and catching of toes on contralateral heel in swing phase bilaterally   FUNCTIONAL TESTS:  5 times sit to stand: 29.3 with UE on thigh   Timed up and go (TUG): 27.96 with two wheeled walker, 34.21 without AD 6 minute walk test: 520 ft with rolling walker, increased fatigue  Berg Balance Scale:.36/56  PATIENT SURVEYS:  FOTO 58 - completed with assistance from SPT and caretaker   TODAY'S TREATMENT:                                                                                                                              DATE: 01/17/2023    TE: Re-issued printout of HEP per pt request   Review of HEP for safe technique with home use and proper understanding of technique: STS hands free 3x10  Standing march with UE support 3x10 each LE Standing hip abd with UE support 3x10 each LE Comments: Pt able to demo correct technique throughout with cuing, but will likely require further instruction future visits  Seated march 2# AW 3x10 each LE LAQ 2# AW 3x10 each LE, more difficulty with LLE Seated heel raise 3x10 each LE Ambulation 4x160 feet with RW and CGA Mini squats with UE support 3x10 Seated gluteal press RTB 3x10 each LE  PATIENT EDUCATION: Education details: POC, Goals,  Person educated: Patient and Caregiver  Education method: Explanation Education comprehension: verbalized understanding and needs further education  HOME EXERCISE PROGRAM: Access Code: [email or web address] URL: [email or web address] Date: 01/08/2023 Prepared by: Grier Rocher  Exercises - Standing March with Counter Support  - 1 x daily - 7 x weekly - 3 sets - 10 reps - 2 hold - Standing Hip Abduction with Counter Support  - 1 x daily - 7 x weekly - 3 sets - 10 reps - 3 hold - Sit to Stand with Arms Crossed  - 1 x daily - 7 x weekly - 3 sets - 10 reps  GOALS: Goals reviewed with patient? Yes  SHORT TERM GOALS: Target date: 02/13/2023  Patient will be independent in home exercise program to improve strength/mobility for better functional independence with ADLs. Baseline: To be provided next visit  Goal status: INITIAL  2.  N/A Baseline:  Goal status: INITIAL    LONG TERM GOALS: Target date: 03/27/23  Patient will increase FOTO score to equal to or greater than   71  to demonstrate statistically significant improvement in mobility and quality of life.  Baseline: 58 Goal status: INITIAL  2.  Patient (> 9 years old) will complete five times sit to stand test in < 15 seconds indicating an increased LE strength and improved balance. Baseline: 29.33 Goal status: INITIAL  3.  Patient will increase six minute walk test distance to >1000 for progression to community ambulator and improve gait ability Baseline: 520 ft with walker Goal status: INITIAL  4.  Patient will reduce timed up and go to <15 seconds to reduce fall risk and demonstrate improved transfer/gait ability. Baseline: 27.96 with walker, 34.21 without AD Goal status: INITIAL  5.  Patient will increase Berg Balance score by > 6 points to demonstrate decreased fall risk during functional activities. Baseline: 36 Goal status: INITIAL  6.  Patient will ascend/descend 5 stairs with least restrictive assistive device without loss of balance to improve  functional mobility.   Baseline: Assess next visit  Goal status: INITIAL  ASSESSMENT:  CLINICAL IMPRESSION: Pt tolerated all tasks during today's visit. Pt requested a copy of HEP and HEP was reviewed to ensure safety and proper technique. Pt was able to tolerate increased rounds of ambulation, as well as mini squats and seated gluteal press compared to last visit. At the beginning of today's visit, pt would lift RW when moving from the chair to the support bar, but this ceased once VC's were provided to keep RW on the ground. Pt did not require VC's to keep RW on the ground during ambulation. Pt will continue to benefit from skilled physical therapy decrease risk of falls, improve quality of life, and maximize function.     OBJECTIVE IMPAIRMENTS: Abnormal gait, decreased activity tolerance, decreased balance, decreased cognition, decreased coordination, decreased endurance, decreased knowledge of use of DME, decreased mobility, difficulty walking, decreased strength, decreased safety awareness, and improper body mechanics.   ACTIVITY LIMITATIONS: carrying, lifting, bending, standing, squatting, stairs, bathing, toileting, and dressing  PARTICIPATION LIMITATIONS: meal prep, cleaning, laundry, medication management, personal finances, driving, shopping, community activity, occupation, and yard work  PERSONAL FACTORS: Behavior pattern and 3+ comorbidities: sick sinus syndrome, vertebral artery stenosis, pacemaker implant, developmental delay disorder, hypertension, and seizure disorder.  are also affecting patient's functional outcome.  REHAB POTENTIAL: Good  CLINICAL DECISION MAKING: Evolving/moderate complexity  EVALUATION COMPLEXITY: Moderate  PLAN:  PT FREQUENCY: 1-2x/week  PT DURATION: 12 weeks  PLANNED INTERVENTIONS: 97110-Therapeutic exercises, 97530- Therapeutic activity, [id number]- Neuromuscular re-education, 97535- Self Care,  69629- Manual therapy, [phone]- Gait training, 32440-  Orthotic Fit/training, 10272- Electrical stimulation (unattended), Patient/Family education, Balance training, Stair training, Joint mobilization, Cryotherapy, and Moist heat  PLAN FOR NEXT SESSION:   develop HEP, LE strengthening dynamic balance and gait without AD    Debara Pickett, SPT This entire session was performed under direct supervision and direction of a licensed Estate agent . I have personally read, edited and approve of the note as written.   Lenda Kelp, PT 01/18/2023, 10:45 AM   Physical Therapist - [facility]  10:45 AM 01/18/23

## 2023-01-18 ENCOUNTER — Inpatient Hospital Stay: Payer: Medicare Other | Admitting: Adult Health

## 2023-01-18 NOTE — Therapy (Signed)
OUTPATIENT OCCUPATIONAL THERAPY NEURO TREATMENT NOTE  [name] MRN: [id number] DOB:10/13/1960, 62 y.o., male Today's Date: 01/18/2023  PCP: Billee Cashing, MD REFERRING PROVIDER: Terisa Starr, MD  END OF SESSION:  OT End of Session - 01/18/23 0824     Visit Number 5    Number of Visits 24    Date for OT Re-Evaluation 03/27/23    OT Start Time 0845    OT Stop Time 0930    OT Time Calculation (min) 45 min    Activity Tolerance Patient tolerated treatment well    Behavior During Therapy [facility] for tasks assessed/performed             Past Medical History:  Diagnosis Date   Depression    Hypertension    MGUS (monoclonal gammopathy of unknown significance)    Pervasive developmental disorder    Seizures (HCC)    SSS (sick sinus syndrome) (HCC)    PPM placed   Past Surgical History:  Procedure Laterality Date   PACEMAKER IMPLANT     Patient Active Problem List   Diagnosis Date Noted   Vertebral artery stenosis 12/18/2022   Left-sided weakness 12/17/2022   MGUS (monoclonal gammopathy of unknown significance) 07/07/2021   Multifocal pneumonia 12/20/2018   Seizure disorder (HCC) 12/20/2018   COVID-19 virus infection 12/20/2018   Acute respiratory failure with hypoxia (HCC) 12/20/2018   Essential hypertension 12/20/2018   Hyponatremia 12/20/2018   Pancytopenia (HCC) 12/20/2018   Developmental delay disorder 12/20/2018   Pneumonia due to COVID-19 virus 12/20/2018   CKD (chronic kidney disease) stage 3, GFR 30-59 ml/min (HCC) 12/20/2018   Age-related nuclear cataract of both eyes 07/18/2017   Early dry stage nonexudative age-related macular degeneration of both eyes 07/18/2017   Myopia with astigmatism and presbyopia, bilateral 07/18/2017   Onychomycosis 06/29/2016   Risk for falls 02/10/2015   Pacemaker 11/06/2014   Septic shock (HCC) 11/06/2014   Dysarthria 10/31/2014   Altered mental status 10/30/2014   Thrombocytopenia (HCC) 10/08/2014    Medication monitoring encounter 04/24/2014   Tremor 04/24/2014   Unsteadiness on feet 04/24/2014   History of nonmelanoma skin cancer 07/22/2013   Colon polyps 07/09/2013   Enuresis, primary, functional 05/09/2013   Gait disturbance 04/17/2013   Hemorrhoid 04/17/2013   Intention tremor 04/17/2013   Actinic keratosis 05/27/2010   Major depressive disorder, single episode, severe, with psychotic behavior (HCC) 03/21/2010   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   Pervasive developmental disorder 03/21/2010   Obsessive-compulsive disorder 03/07/2010   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 was accompanied by his personal caregiver, [name] Pt accompanied by:  Personal caregiver, Britta Mccreedy  PERTINENT HISTORY:   Pt. is a 62 y.o. male who was admitted to the hospital with Left sided weakness, and left sided weakness, vertebral artery stenosis, sick sinus syndrome, Essential tremor, Pervasive Developmental Delay Disorder, Pacemaker implant s/p 6-7 years per caregiver. Pt. Resides at [facility], and attends [facility].   PRECAUTIONS: ICD/Pacemaker 6-7 years, Fall, Pureed diet, with sipping cup  WEIGHT BEARING RESTRICTIONS: No  PAIN:  Are you having pain? No  FALLS: Has patient fallen in last 6 months? Yes. Number of falls 2  LIVING ENVIRONMENT: Lives with: [name] Lives in: No steps to enter  Has following equipment at home: Walker - 2 wheeled and shower chair  PLOF: Independent with basic ADLs  PATIENT GOALS:    To be able to walk like everyone else  OBJECTIVE:  Note: Objective measures were completed at Evaluation unless otherwise noted.  HAND DOMINANCE:  Left  ADLs:  Transfers/ambulation related to ADLs: Eating: Independent uses a sipping cup, pureed diet Grooming: Independent with brushing teeth, caregiver assist with shaving UB Dressing: MinA LB Dressing: Supervision Toileting: Caregiver assist Bathing: Caregiver assist Tub Shower transfers: MinA Equipment: none  IADLs: Shopping: N/A Light housekeeping:  Caregiver Assist now Meal Prep: Pureed/ Group Home prepares all meals Community mobility:  Relies on transportation services Medication management: Caregivers provide medication management Financial management: Caregivers Assist Handwriting: 50% legible  MOBILITY STATUS: Needs Assist: RW and Hx of falls  POSTURE COMMENTS:  Leans to the left in supported  sitting   ACTIVITY TOLERANCE: Activity tolerance: Limited  FUNCTIONAL OUTCOME MEASURES: FOTO: 73  UPPER EXTREMITY ROM:    Active ROM Right eval Left eval  Shoulder flexion 74(100) 80(122)  Shoulder abduction 73(94) 70(98)  Shoulder adduction    Shoulder extension    Shoulder internal rotation    Shoulder external rotation    Elbow flexion [facility] WFL  Elbow extension [facility] WFL  Wrist flexion [facility] WFL  Wrist extension [facility] WFL  Wrist ulnar deviation    Wrist radial deviation    Wrist pronation    Wrist supination    (Blank rows = not tested)  UPPER EXTREMITY MMT:     MMT Right eval Left eval  Shoulder flexion 3-/5 3-/5  Shoulder abduction 3-/5 3-/5  Shoulder adduction    Shoulder extension    Shoulder internal rotation    Shoulder external rotation    Middle trapezius    Lower trapezius    Elbow flexion 4/5 4/5  Elbow extension 4-/5 4-/5  Wrist flexion 4-/5 4-/5  Wrist extension 4-/5 4-/5  Wrist ulnar deviation    Wrist radial deviation    Wrist pronation    Wrist supination    (Blank rows = not tested)  HAND FUNCTION: Grip strength: Right: 38 lbs; Left: 27 lbs, Lateral pinch: Right: 13 lbs, Left: 14 lbs, and 3 point pinch: Right: 16 lbs, Left:  13 lbs  COORDINATION: 9 Hole Peg test: Right: 55 sec; Left: 47 sec  SENSATION: Light Touch: TBD Proprioception: TBD  EDEMA: N/A  MUSCLE TONE: Ataxia  COGNITION: Overall cognitive status: Within functional limits for tasks assessed   Caregiver reports becomes agitated more easily    VISION ASSESSMENT:   Glasses  PERCEPTION: Intact  PRAXIS: Impaired: Motor planning   TODAY'S TREATMENT:                                                                                                                              DATE: 01/18/2023   Therapeutic Exercise:   Pt. worked on [employer], and reciprocal motion using the UBE  while seated for 5 min. & 27 sec. with no resistance. Constant monitoring, and cues were provided. Pt. performed 2# dowel ex. for UE strengthening secondary to weakness. Bilateral shoulder flexion, chest press, circular patterns, and elbow flexion/extension were performed.   Neuromuscular re-education:  Pt. worked on [facility] skills grasping 1" sticks, 1/4" collars, and 1/4" washers. Pt. worked on storing the objects in the palm, and translatory skills moving the items from the palm of the hand to the tip of the 2nd digit, and thumb. Pt. worked on removing the pegs using bilateral alternating hand patterns.        PATIENT EDUCATION: Education details:  UE strengthening Person educated: Patient and Child psychotherapist: Explanation, Demonstration, and Verbal cues Education comprehension: verbalized understanding, returned demonstration, verbal cues required, and needs further education  HOME EXERCISE PROGRAM:   To continue to assess, and provide as needed   GOALS: Goals reviewed with patient? Yes  SHORT TERM GOALS: Target date: 02/13/2023    Pt. Will require supervision with HEPs for BE UEs. Baseline: Eval: No current HEP Goal status: INITIAL  LONG TERM GOALS: Target date: 03/27/2023    Pt. Will assume upright sitting with with  minimal cues for the duration of a 15 min. Task.  Baseline: Pt. with positive left sided leaning. Goal status: INITIAL  2.  Pt. Will increase bilateral shoulder flexion ROM by 10 degrees to assist with bathing/washing his hair Baseline: Eval: shoulder flexion: Right: 74(100) Left: 80(122)  Goal status: INITIAL  3.  Pt. Will improve bilateral shoulder abduction ROM by 10 degrees to assist with UE dressing skills. Baseline: Eval: shoulder abduction: Right: 73(94), Left: 70(98) Goal status: INITIAL  4.  Pt. Will improve left grip strength by 5# to be able to hold items securely Baseline: Eval: Right: 38# Left: 27# Goal status: INITIAL  5.  Pt. Will improve [facility] skills by 10 sec to be able to independently, and efficiently manipulate small items. Baseline: Eval: Left 55 sec Goal status: INITIAL  6.  Pt. will increase FOTO score by 2 points to reflect Pt. perceived improvement with assessment specific ADL/IADL's.  Baseline: Eval: 73 Goal status: INITIAL  ASSESSMENT:  CLINICAL IMPRESSION:  Pt. required cues for form, and technique with each of the strengthening exercises, as well as cues for visual demonstration.  Pt. required step-by-step cuing for [facility] tasks, and manipulating small objects on the [employer] pegboard, and performing bilateral alternating hand movements to remove the 1" sticks. Pt. continues to benefit from OT services for ADL training, A/E training, UE there. Ex., neuromuscular re-education, and pt./caregiver education.  PERFORMANCE DEFICITS: in functional skills including ADLs, IADLs, coordination, dexterity, ROM, strength, Fine motor control, Gross motor control, balance, and UE functional use, cognitive skills including problem solving and safety awareness, and psychosocial skills including coping strategies, environmental adaptation, interpersonal interactions, and routines and behaviors.   IMPAIRMENTS: are limiting patient from ADLs, IADLs, work, play, leisure, and social  participation.   CO-MORBIDITIES: has co-morbidities such as Pervasive Developmental Delay Disorder  that affects occupational performance. Patient will benefit from skilled OT to address above impairments and improve overall function.  MODIFICATION OR ASSISTANCE TO COMPLETE EVALUATION: Min-Moderate modification of tasks or assist with assess necessary to complete an evaluation.  OT OCCUPATIONAL PROFILE AND HISTORY: Detailed assessment: Review of records and additional review of physical, cognitive, psychosocial history related to current functional performance.  CLINICAL DECISION MAKING: Moderate - several treatment options, min-mod task modification necessary  REHAB POTENTIAL: Good  EVALUATION COMPLEXITY:  Moderate    PLAN:  OT FREQUENCY: 2x/week  OT DURATION: 12 weeks  PLANNED INTERVENTIONS: 97535 self care/ADL training, 84696 therapeutic exercise, 97530 therapeutic activity, 97112 neuromuscular re-education, 97140 manual therapy, 97010 moist heat, 97034 contrast bath, balance training, patient/family education, and DME and/or AE instructions  RECOMMENDED OTHER SERVICES: PT & ST  CONSULTED AND AGREED WITH PLAN OF CARE: Patient and family member/caregiver  PLAN FOR NEXT SESSION: see above  Olegario Messier, MS, OTR/L   01/18/2023, 8:26 AM

## 2023-01-22 ENCOUNTER — Ambulatory Visit: Payer: Medicare Other

## 2023-01-22 DIAGNOSIS — M6281 Muscle weakness (generalized): Secondary | ICD-10-CM

## 2023-01-22 DIAGNOSIS — R278 Other lack of coordination: Secondary | ICD-10-CM

## 2023-01-22 NOTE — Therapy (Signed)
OUTPATIENT OCCUPATIONAL THERAPY NEURO TREATMENT NOTE  [name] MRN: [id number] DOB:1960-09-06, 62 y.o., male Today's Date: 01/22/2023  PCP: Billee Cashing, MD REFERRING PROVIDER: Terisa Starr, MD  END OF SESSION:  OT End of Session - 01/22/23 1251     Visit Number 6    Number of Visits 24    Date for OT Re-Evaluation 03/27/23    OT Start Time 1200    OT Stop Time 1230    OT Time Calculation (min) 30 min    Activity Tolerance Patient tolerated treatment well    Behavior During Therapy [facility] for tasks assessed/performed             Past Medical History:  Diagnosis Date   Depression    Hypertension    MGUS (monoclonal gammopathy of unknown significance)    Pervasive developmental disorder    Seizures (HCC)    SSS (sick sinus syndrome) (HCC)    PPM placed   Past Surgical History:  Procedure Laterality Date   PACEMAKER IMPLANT     Patient Active Problem List   Diagnosis Date Noted   Vertebral artery stenosis 12/18/2022   Left-sided weakness 12/17/2022   MGUS (monoclonal gammopathy of unknown significance) 07/07/2021   Multifocal pneumonia 12/20/2018   Seizure disorder (HCC) 12/20/2018   COVID-19 virus infection 12/20/2018   Acute respiratory failure with hypoxia (HCC) 12/20/2018   Essential hypertension 12/20/2018   Hyponatremia 12/20/2018   Pancytopenia (HCC) 12/20/2018   Developmental delay disorder 12/20/2018   Pneumonia due to COVID-19 virus 12/20/2018   CKD (chronic kidney disease) stage 3, GFR 30-59 ml/min (HCC) 12/20/2018   Age-related nuclear cataract of both eyes 07/18/2017   Early dry stage nonexudative age-related macular degeneration of both eyes 07/18/2017   Myopia with astigmatism and presbyopia, bilateral 07/18/2017   Onychomycosis 06/29/2016   Risk for falls 02/10/2015   Pacemaker 11/06/2014   Septic shock (HCC) 11/06/2014   Dysarthria 10/31/2014   Altered mental status 10/30/2014   Thrombocytopenia (HCC) 10/08/2014    Medication monitoring encounter 04/24/2014   Tremor 04/24/2014   Unsteadiness on feet 04/24/2014   History of nonmelanoma skin cancer 07/22/2013   Colon polyps 07/09/2013   Enuresis, primary, functional 05/09/2013   Gait disturbance 04/17/2013   Hemorrhoid 04/17/2013   Intention tremor 04/17/2013   Actinic keratosis 05/27/2010   Major depressive disorder, single episode, severe, with psychotic behavior (HCC) 03/21/2010   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   Pervasive developmental disorder 03/21/2010   Obsessive-compulsive disorder 03/07/2010   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   Obesity due to excess calories 02/25/2010    ONSET DATE: 12/16/2022  REFERRING DIAG: Left sided weakness  THERAPY DIAG:  Muscle weakness (generalized)  Other lack of coordination  Rationale for Evaluation and Treatment: Rehabilitation  SUBJECTIVE:   SUBJECTIVE STATEMENT: Pt reports doing well today.  Pt and caregiver arrived late d/t waiting on parking upstairs, but both agreeable to shortened tx session today. Pt accompanied by:  Personal caregiver, Britta Mccreedy  PERTINENT HISTORY:   Pt. is a 62 y.o. male who was admitted to the hospital with Left sided weakness, and left sided weakness, vertebral artery stenosis, sick sinus syndrome, Essential tremor, Pervasive Developmental Delay Disorder, Pacemaker implant s/p 6-7 years per caregiver. Pt. Resides at [facility], and attends [facility].   PRECAUTIONS: ICD/Pacemaker 6-7 years, Fall, Pureed diet, with sipping cup  WEIGHT BEARING RESTRICTIONS: No  PAIN:  Are you having pain? No  FALLS: Has patient fallen in last 6  months? Yes. Number of falls 2  LIVING ENVIRONMENT: Lives with: [name] Lives in: No steps to enter Has following equipment at home: Walker - 2 wheeled and shower chair  PLOF: Independent with basic ADLs  PATIENT GOALS:    To be able to walk like everyone else  OBJECTIVE:  Note:  Objective measures were completed at Evaluation unless otherwise noted.  HAND DOMINANCE: Left  ADLs:  Transfers/ambulation related to ADLs: Eating: Independent uses a sipping cup, pureed diet Grooming: Independent with brushing teeth, caregiver assist with shaving UB Dressing: MinA LB Dressing: Supervision Toileting: Caregiver assist Bathing: Caregiver assist Tub Shower transfers: MinA Equipment: none  IADLs: Shopping: N/A Light housekeeping:  Caregiver Assist now Meal Prep: Pureed/ Group Home prepares all meals Community mobility:  Relies on transportation services Medication management: Caregivers provide medication management Financial management: Caregivers Assist Handwriting: 50% legible  MOBILITY STATUS: Needs Assist: RW and Hx of falls  POSTURE COMMENTS:  Leans to the left in supported  sitting   ACTIVITY TOLERANCE: Activity tolerance: Limited  FUNCTIONAL OUTCOME MEASURES: FOTO: 73  UPPER EXTREMITY ROM:    Active ROM Right eval Left eval  Shoulder flexion 74(100) 80(122)  Shoulder abduction 73(94) 70(98)  Shoulder adduction    Shoulder extension    Shoulder internal rotation    Shoulder external rotation    Elbow flexion [facility] WFL  Elbow extension [facility] WFL  Wrist flexion [facility] WFL  Wrist extension [facility] WFL  Wrist ulnar deviation    Wrist radial deviation    Wrist pronation    Wrist supination    (Blank rows = not tested)  UPPER EXTREMITY MMT:     MMT Right eval Left eval  Shoulder flexion 3-/5 3-/5  Shoulder abduction 3-/5 3-/5  Shoulder adduction    Shoulder extension    Shoulder internal rotation    Shoulder external rotation    Middle trapezius    Lower trapezius    Elbow flexion 4/5 4/5  Elbow extension 4-/5 4-/5  Wrist flexion 4-/5 4-/5  Wrist extension 4-/5 4-/5  Wrist ulnar deviation    Wrist radial deviation    Wrist pronation    Wrist supination    (Blank rows = not tested)  HAND FUNCTION: Grip strength: Right: 38 lbs; Left:  27 lbs, Lateral pinch: Right: 13 lbs, Left: 14 lbs, and 3 point pinch: Right: 16 lbs, Left: 13 lbs  COORDINATION: 9 Hole Peg test: Right: 55 sec; Left: 47 sec  SENSATION: Light Touch: TBD Proprioception: TBD  EDEMA: N/A  MUSCLE TONE: Ataxia  COGNITION: Overall cognitive status: Within functional limits for tasks assessed   Caregiver reports becomes agitated more easily    VISION ASSESSMENT:   Glasses  PERCEPTION: Intact  PRAXIS: Impaired: Motor planning   TODAY'S TREATMENT:  DATE: 01/22/2023  Therapeutic Exercise: -Facilitated hand strengthening with use of hand gripper set at 17.9# to remove jumbo pegs from pegboard x2 trials using L hand.  -Participated in dowel climb x3 reps, with OT assisting with sequencing and to maximize end range shoulder flexion bilaterally. -Completed BUE strengthening with 2# dowel to perform chest press, shoulder press, ER to top of head, horiz abd/add, and abd for 2 sets 10 reps each.  Supv to min guard to maximize end range in each plane of movement.   -Completed passive stretching to bilat shoulders for shoulder flexion, abd, and ER, working to increase bilat shoulder flexibility for UB ADLs and functional reaching tasks.    PATIENT EDUCATION: Education details:  UE strengthening Person educated: Patient and Child psychotherapist: Explanation, Demonstration, and Verbal cues Education comprehension: verbalized understanding, returned demonstration, verbal cues required, and needs further education  HOME EXERCISE PROGRAM:   To continue to assess, and provide as needed   GOALS: Goals reviewed with patient? Yes  SHORT TERM GOALS: Target date: 02/13/2023    Pt. Will require supervision with HEPs for BE UEs. Baseline: Eval: No current HEP Goal status: INITIAL  LONG TERM GOALS: Target date: 03/27/2023     Pt. Will assume upright sitting with with minimal cues for the duration of a 15 min. Task.  Baseline: Pt. with positive left sided leaning. Goal status: INITIAL  2.  Pt. Will increase bilateral shoulder flexion ROM by 10 degrees to assist with bathing/washing his hair Baseline: Eval: shoulder flexion: Right: 74(100) Left: 80(122)  Goal status: INITIAL  3.  Pt. Will improve bilateral shoulder abduction ROM by 10 degrees to assist with UE dressing skills. Baseline: Eval: shoulder abduction: Right: 73(94), Left: 70(98) Goal status: INITIAL  4.  Pt. Will improve left grip strength by 5# to be able to hold items securely Baseline: Eval: Right: 38# Left: 27# Goal status: INITIAL  5.  Pt. Will improve [facility] skills by 10 sec to be able to independently, and efficiently manipulate small items. Baseline: Eval: Left 55 sec Goal status: INITIAL  6.  Pt. will increase FOTO score by 2 points to reflect Pt. perceived improvement with assessment specific ADL/IADL's.  Baseline: Eval: 73 Goal status: INITIAL  ASSESSMENT:  CLINICAL IMPRESSION: Pt and caregiver arrived late d/t waiting on parking upstairs, but both agreeable to shortened tx session today.  Focus this date on L grip strengthening, bilat shoulder strengthening, and increasing bilat shoulder flexibility for UB ADLs and functional reaching.  Good tolerance to all therapeutic exercises this date.  Pt was able to tolerate increased resistance on hand gripper today to remove jumbo pegs from pegboard, with setting at 17.9# for 2 trials using L hand.  Pt. continues to benefit from OT services for ADL training, A/E training, UE there. Ex., neuromuscular re-education, and pt./caregiver education.  PERFORMANCE DEFICITS: in functional skills including ADLs, IADLs, coordination, dexterity, ROM, strength, Fine motor control, Gross motor control, balance, and UE functional use, cognitive skills including problem solving and safety awareness, and  psychosocial skills including coping strategies, environmental adaptation, interpersonal interactions, and routines and behaviors.   IMPAIRMENTS: are limiting patient from ADLs, IADLs, work, play, leisure, and social participation.   CO-MORBIDITIES: has co-morbidities such as Pervasive Developmental Delay Disorder  that affects occupational performance. Patient will benefit from skilled OT to address above impairments and improve overall function.  MODIFICATION OR ASSISTANCE TO COMPLETE EVALUATION: Min-Moderate modification of tasks or assist with assess necessary to complete an evaluation.  OT  OCCUPATIONAL PROFILE AND HISTORY: Detailed assessment: Review of records and additional review of physical, cognitive, psychosocial history related to current functional performance.  CLINICAL DECISION MAKING: Moderate - several treatment options, min-mod task modification necessary  REHAB POTENTIAL: Good  EVALUATION COMPLEXITY: Moderate    PLAN:  OT FREQUENCY: 2x/week  OT DURATION: 12 weeks  PLANNED INTERVENTIONS: 97535 self care/ADL training, 09811 therapeutic exercise, 97530 therapeutic activity, 97112 neuromuscular re-education, 97140 manual therapy, 97010 moist heat, 97034 contrast bath, balance training, patient/family education, and DME and/or AE instructions  RECOMMENDED OTHER SERVICES: PT & ST  CONSULTED AND AGREED WITH PLAN OF CARE: Patient and family member/caregiver  PLAN FOR NEXT SESSION: see above  Danelle Earthly, MS, OTR/L   01/22/2023, 12:52 PM

## 2023-01-24 ENCOUNTER — Ambulatory Visit: Payer: Medicare Other | Admitting: Occupational Therapy

## 2023-01-24 DIAGNOSIS — R278 Other lack of coordination: Secondary | ICD-10-CM

## 2023-01-24 DIAGNOSIS — M6281 Muscle weakness (generalized): Secondary | ICD-10-CM | POA: Diagnosis not present

## 2023-01-24 NOTE — Therapy (Signed)
OUTPATIENT OCCUPATIONAL THERAPY NEURO TREATMENT NOTE  [name] MRN: [id number] DOB:01/20/1961, 62 y.o., male Today's Date: 01/24/2023  PCP: Billee Cashing, MD REFERRING PROVIDER: Terisa Starr, MD  END OF SESSION:  OT End of Session - 01/24/23 1055     Visit Number 7    Number of Visits 24    Date for OT Re-Evaluation 03/27/23    OT Start Time 0930    OT Stop Time 1015    OT Time Calculation (min) 45 min    Activity Tolerance Patient tolerated treatment well    Behavior During Therapy [facility] for tasks assessed/performed             Past Medical History:  Diagnosis Date   Depression    Hypertension    MGUS (monoclonal gammopathy of unknown significance)    Pervasive developmental disorder    Seizures (HCC)    SSS (sick sinus syndrome) (HCC)    PPM placed   Past Surgical History:  Procedure Laterality Date   PACEMAKER IMPLANT     Patient Active Problem List   Diagnosis Date Noted   Vertebral artery stenosis 12/18/2022   Left-sided weakness 12/17/2022   MGUS (monoclonal gammopathy of unknown significance) 07/07/2021   Multifocal pneumonia 12/20/2018   Seizure disorder (HCC) 12/20/2018   COVID-19 virus infection 12/20/2018   Acute respiratory failure with hypoxia (HCC) 12/20/2018   Essential hypertension 12/20/2018   Hyponatremia 12/20/2018   Pancytopenia (HCC) 12/20/2018   Developmental delay disorder 12/20/2018   Pneumonia due to COVID-19 virus 12/20/2018   CKD (chronic kidney disease) stage 3, GFR 30-59 ml/min (HCC) 12/20/2018   Age-related nuclear cataract of both eyes 07/18/2017   Early dry stage nonexudative age-related macular degeneration of both eyes 07/18/2017   Myopia with astigmatism and presbyopia, bilateral 07/18/2017   Onychomycosis 06/29/2016   Risk for falls 02/10/2015   Pacemaker 11/06/2014   Septic shock (HCC) 11/06/2014   Dysarthria 10/31/2014   Altered mental status 10/30/2014   Thrombocytopenia (HCC) 10/08/2014    Medication monitoring encounter 04/24/2014   Tremor 04/24/2014   Unsteadiness on feet 04/24/2014   History of nonmelanoma skin cancer 07/22/2013   Colon polyps 07/09/2013   Enuresis, primary, functional 05/09/2013   Gait disturbance 04/17/2013   Hemorrhoid 04/17/2013   Intention tremor 04/17/2013   Actinic keratosis 05/27/2010   Major depressive disorder, single episode, severe, with psychotic behavior (HCC) 03/21/2010   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   Pervasive developmental disorder 03/21/2010   Obsessive-compulsive disorder 03/07/2010   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   Obesity due to excess calories 02/25/2010    ONSET DATE: 12/16/2022  REFERRING DIAG: Left sided weakness  THERAPY DIAG:  Muscle weakness (generalized)  Other lack of coordination  Rationale for Evaluation and Treatment: Rehabilitation  SUBJECTIVE:   SUBJECTIVE STATEMENT:  Pt reports doing well today.  Pt accompanied by:  Personal caregiver, [name]  PERTINENT HISTORY:   Pt. is a 61 y.o. male who was admitted to the hospital with Left sided weakness, and left sided weakness, vertebral artery stenosis, sick sinus syndrome, Essential tremor, Pervasive Developmental Delay Disorder, Pacemaker implant s/p 6-7 years per caregiver. Pt. Resides at [facility], and attends [facility].   PRECAUTIONS: ICD/Pacemaker 6-7 years, Fall, Pureed diet, with sipping cup  WEIGHT BEARING RESTRICTIONS: No  PAIN:  Are you having pain? No  FALLS: Has patient fallen in last 6 months? Yes. Number of falls 2  LIVING ENVIRONMENT: Lives with: [name] Lives in:  No steps to enter Has following equipment at home: Walker - 2 wheeled and shower chair  PLOF: Independent with basic ADLs  PATIENT GOALS:    To be able to walk like everyone else  OBJECTIVE:  Note: Objective measures were completed at Evaluation unless otherwise noted.  HAND DOMINANCE:  Left  ADLs:  Transfers/ambulation related to ADLs: Eating: Independent uses a sipping cup, pureed diet Grooming: Independent with brushing teeth, caregiver assist with shaving UB Dressing: MinA LB Dressing: Supervision Toileting: Caregiver assist Bathing: Caregiver assist Tub Shower transfers: MinA Equipment: none  IADLs: Shopping: N/A Light housekeeping:  Caregiver Assist now Meal Prep: Pureed/ Group Home prepares all meals Community mobility:  Relies on transportation services Medication management: Caregivers provide medication management Financial management: Caregivers Assist Handwriting: 50% legible  MOBILITY STATUS: Needs Assist: RW and Hx of falls  POSTURE COMMENTS:  Leans to the left in supported  sitting   ACTIVITY TOLERANCE: Activity tolerance: Limited  FUNCTIONAL OUTCOME MEASURES: FOTO: 73  UPPER EXTREMITY ROM:    Active ROM Right eval Left eval  Shoulder flexion 74(100) 80(122)  Shoulder abduction 73(94) 70(98)  Shoulder adduction    Shoulder extension    Shoulder internal rotation    Shoulder external rotation    Elbow flexion [facility] WFL  Elbow extension [facility] WFL  Wrist flexion [facility] WFL  Wrist extension [facility] WFL  Wrist ulnar deviation    Wrist radial deviation    Wrist pronation    Wrist supination    (Blank rows = not tested)  UPPER EXTREMITY MMT:     MMT Right eval Left eval  Shoulder flexion 3-/5 3-/5  Shoulder abduction 3-/5 3-/5  Shoulder adduction    Shoulder extension    Shoulder internal rotation    Shoulder external rotation    Middle trapezius    Lower trapezius    Elbow flexion 4/5 4/5  Elbow extension 4-/5 4-/5  Wrist flexion 4-/5 4-/5  Wrist extension 4-/5 4-/5  Wrist ulnar deviation    Wrist radial deviation    Wrist pronation    Wrist supination    (Blank rows = not tested)  HAND FUNCTION: Grip strength: Right: 38 lbs; Left: 27 lbs, Lateral pinch: Right: 13 lbs, Left: 14 lbs, and 3 point pinch: Right: 16 lbs, Left:  13 lbs  COORDINATION: 9 Hole Peg test: Right: 55 sec; Left: 47 sec  SENSATION: Light Touch: TBD Proprioception: TBD  EDEMA: N/A  MUSCLE TONE: Ataxia  COGNITION: Overall cognitive status: Within functional limits for tasks assessed   Caregiver reports becomes agitated more easily    VISION ASSESSMENT:   Glasses  PERCEPTION: Intact  PRAXIS: Impaired: Motor planning   TODAY'S TREATMENT:                                                                                                                              DATE: 01/22/2023   Therapeutic Exercise:  Pt. performed 2.5# dowel ex. For BUE strengthening  secondary to weakness. Bilateral shoulder flexion, chest press, circular patterns, and elbow flexion/extension were performed. 3# dumbbell ex. for elbow flexion and extension, forearm supination/pronation, 2# wrist flexion/extension, and radial deviation. Pt. requires rest breaks and verbal cues for proper technique. BUE there. Ex. was performed to increase UE strength needed for functional mobility, ADLs, and IADL tasks.   Neuromuscular re-education:  Pt. worked on bilateral hand coordination grasping, flipping, turning, and stacking [place] style discs. Pt. required visual demonstration, and cues for movement patterns. Pt. worked on increasing speed, and [facility] coordination skills.     PATIENT EDUCATION: Education details:  UE strengthening Person educated: Patient and Child psychotherapist: Explanation, Demonstration, and Verbal cues Education comprehension: verbalized understanding, returned demonstration, verbal cues required, and needs further education  HOME EXERCISE PROGRAM:   To continue to assess, and provide as needed   GOALS: Goals reviewed with patient? Yes  SHORT TERM GOALS: Target date: 02/13/2023    Pt. Will require supervision with HEPs for BE UEs. Baseline: Eval: No current HEP Goal status: INITIAL  LONG TERM GOALS: Target date:  03/27/2023    Pt. Will assume upright sitting with with minimal cues for the duration of a 15 min. Task.  Baseline: Pt. with positive left sided leaning. Goal status: INITIAL  2.  Pt. Will increase bilateral shoulder flexion ROM by 10 degrees to assist with bathing/washing his hair Baseline: Eval: shoulder flexion: Right: 74(100) Left: 80(122)  Goal status: INITIAL  3.  Pt. Will improve bilateral shoulder abduction ROM by 10 degrees to assist with UE dressing skills. Baseline: Eval: shoulder abduction: Right: 73(94), Left: 70(98) Goal status: INITIAL  4.  Pt. Will improve left grip strength by 5# to be able to hold items securely Baseline: Eval: Right: 38# Left: 27# Goal status: INITIAL  5.  Pt. Will improve [facility] skills by 10 sec to be able to independently, and efficiently manipulate small items. Baseline: Eval: Left 55 sec Goal status: INITIAL  6.  Pt. will increase FOTO score by 2 points to reflect Pt. perceived improvement with assessment specific ADL/IADL's.  Baseline: Eval: 73 Goal status: INITIAL  ASSESSMENT:  CLINICAL IMPRESSION:  Pt. tolerated the UE exercises well, however requires cues, assist, and visual demonstration for proper form, technique, and pace. Pt. was able to manipulate, and flip the [place] discs, however requires increased time during the task. Pt. continues to benefit from OT services for ADL training, A/E training, UE there. Ex., neuromuscular re-education, and pt./caregiver education.  PERFORMANCE DEFICITS: in functional skills including ADLs, IADLs, coordination, dexterity, ROM, strength, Fine motor control, Gross motor control, balance, and UE functional use, cognitive skills including problem solving and safety awareness, and psychosocial skills including coping strategies, environmental adaptation, interpersonal interactions, and routines and behaviors.   IMPAIRMENTS: are limiting patient from ADLs, IADLs, work, play, leisure, and social  participation.   CO-MORBIDITIES: has co-morbidities such as Pervasive Developmental Delay Disorder  that affects occupational performance. Patient will benefit from skilled OT to address above impairments and improve overall function.  MODIFICATION OR ASSISTANCE TO COMPLETE EVALUATION: Min-Moderate modification of tasks or assist with assess necessary to complete an evaluation.  OT OCCUPATIONAL PROFILE AND HISTORY: Detailed assessment: Review of records and additional review of physical, cognitive, psychosocial history related to current functional performance.  CLINICAL DECISION MAKING: Moderate - several treatment options, min-mod task modification necessary  REHAB POTENTIAL: Good  EVALUATION COMPLEXITY: Moderate    PLAN:  OT FREQUENCY: 2x/week  OT DURATION: 12 weeks  PLANNED INTERVENTIONS: 04540  self care/ADL training, 11914 therapeutic exercise, 97530 therapeutic activity, 97112 neuromuscular re-education, 97140 manual therapy, 97010 moist heat, 97034 contrast bath, balance training, patient/family education, and DME and/or AE instructions  RECOMMENDED OTHER SERVICES: PT & ST  CONSULTED AND AGREED WITH PLAN OF CARE: Patient and family member/caregiver  PLAN FOR NEXT SESSION: see above  Olegario Messier, MS, OTR/L   01/24/2023, 3:00 PM

## 2023-01-30 ENCOUNTER — Ambulatory Visit: Payer: Medicare Other

## 2023-02-05 ENCOUNTER — Ambulatory Visit: Payer: Medicare Other | Admitting: Occupational Therapy

## 2023-02-05 ENCOUNTER — Ambulatory Visit: Payer: Medicare Other

## 2023-02-05 DIAGNOSIS — R278 Other lack of coordination: Secondary | ICD-10-CM

## 2023-02-05 DIAGNOSIS — M6281 Muscle weakness (generalized): Secondary | ICD-10-CM

## 2023-02-05 DIAGNOSIS — R262 Difficulty in walking, not elsewhere classified: Secondary | ICD-10-CM

## 2023-02-05 DIAGNOSIS — R2681 Unsteadiness on feet: Secondary | ICD-10-CM

## 2023-02-05 NOTE — Therapy (Signed)
OUTPATIENT OCCUPATIONAL THERAPY NEURO TREATMENT NOTE  [name] MRN: [id number] DOB:29-Nov-1960, 62 y.o., male Today's Date: 02/05/2023  PCP: Billee Cashing, MD REFERRING PROVIDER: Terisa Starr, MD  END OF SESSION:  OT End of Session - 02/05/23 0937     Visit Number 8    Number of Visits 24    Date for OT Re-Evaluation 03/27/23    OT Start Time 0845    OT Stop Time 0930    OT Time Calculation (min) 45 min    Activity Tolerance Patient tolerated treatment well    Behavior During Therapy [facility] for tasks assessed/performed             Past Medical History:  Diagnosis Date   Depression    Hypertension    MGUS (monoclonal gammopathy of unknown significance)    Pervasive developmental disorder    Seizures (HCC)    SSS (sick sinus syndrome) (HCC)    PPM placed   Past Surgical History:  Procedure Laterality Date   PACEMAKER IMPLANT     Patient Active Problem List   Diagnosis Date Noted   Vertebral artery stenosis 12/18/2022   Left-sided weakness 12/17/2022   MGUS (monoclonal gammopathy of unknown significance) 07/07/2021   Multifocal pneumonia 12/20/2018   Seizure disorder (HCC) 12/20/2018   COVID-19 virus infection 12/20/2018   Acute respiratory failure with hypoxia (HCC) 12/20/2018   Essential hypertension 12/20/2018   Hyponatremia 12/20/2018   Pancytopenia (HCC) 12/20/2018   Developmental delay disorder 12/20/2018   Pneumonia due to COVID-19 virus 12/20/2018   CKD (chronic kidney disease) stage 3, GFR 30-59 ml/min (HCC) 12/20/2018   Age-related nuclear cataract of both eyes 07/18/2017   Early dry stage nonexudative age-related macular degeneration of both eyes 07/18/2017   Myopia with astigmatism and presbyopia, bilateral 07/18/2017   Onychomycosis 06/29/2016   Risk for falls 02/10/2015   Pacemaker 11/06/2014   Septic shock (HCC) 11/06/2014   Dysarthria 10/31/2014   Altered mental status 10/30/2014   Thrombocytopenia (HCC) 10/08/2014    Medication monitoring encounter 04/24/2014   Tremor 04/24/2014   Unsteadiness on feet 04/24/2014   History of nonmelanoma skin cancer 07/22/2013   Colon polyps 07/09/2013   Enuresis, primary, functional 05/09/2013   Gait disturbance 04/17/2013   Hemorrhoid 04/17/2013   Intention tremor 04/17/2013   Actinic keratosis 05/27/2010   Major depressive disorder, single episode, severe, with psychotic behavior (HCC) 03/21/2010   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   Pervasive developmental disorder 03/21/2010   Obsessive-compulsive disorder 03/07/2010   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   Obesity due to excess calories 02/25/2010    ONSET DATE: 12/16/2022  REFERRING DIAG: Left sided weakness  THERAPY DIAG:  Muscle weakness (generalized)  Other lack of coordination  Rationale for Evaluation and Treatment: Rehabilitation  SUBJECTIVE:   SUBJECTIVE STATEMENT:  Pt reports doing well today.  Pt accompanied by:  Personal caregiver, [name]  PERTINENT HISTORY:   Pt. is a 62 y.o. male who was admitted to the hospital with Left sided weakness, and left sided weakness, vertebral artery stenosis, sick sinus syndrome, Essential tremor, Pervasive Developmental Delay Disorder, Pacemaker implant s/p 6-7 years per caregiver. Pt. Resides at [facility], and attends [facility].   PRECAUTIONS: ICD/Pacemaker 6-7 years, Fall, Pureed diet, with sipping cup  WEIGHT BEARING RESTRICTIONS: No  PAIN:  Are you having pain? No  FALLS: Has patient fallen in last 6 months? Yes. Number of falls 2  LIVING ENVIRONMENT: Lives with: [name] Lives in:  No steps to enter Has following equipment at home: Walker - 2 wheeled and shower chair  PLOF: Independent with basic ADLs  PATIENT GOALS:    To be able to walk like everyone else  OBJECTIVE:  Note: Objective measures were completed at Evaluation unless otherwise noted.  HAND DOMINANCE:  Left  ADLs:  Transfers/ambulation related to ADLs: Eating: Independent uses a sipping cup, pureed diet Grooming: Independent with brushing teeth, caregiver assist with shaving UB Dressing: MinA LB Dressing: Supervision Toileting: Caregiver assist Bathing: Caregiver assist Tub Shower transfers: MinA Equipment: none  IADLs: Shopping: N/A Light housekeeping:  Caregiver Assist now Meal Prep: Pureed/ Group Home prepares all meals Community mobility:  Relies on transportation services Medication management: Caregivers provide medication management Financial management: Caregivers Assist Handwriting: 50% legible  MOBILITY STATUS: Needs Assist: RW and Hx of falls  POSTURE COMMENTS:  Leans to the left in supported  sitting   ACTIVITY TOLERANCE: Activity tolerance: Limited  FUNCTIONAL OUTCOME MEASURES: FOTO: 73  UPPER EXTREMITY ROM:    Active ROM Right eval Left eval  Shoulder flexion 74(100) 80(122)  Shoulder abduction 73(94) 70(98)  Shoulder adduction    Shoulder extension    Shoulder internal rotation    Shoulder external rotation    Elbow flexion [facility] WFL  Elbow extension [facility] WFL  Wrist flexion [facility] WFL  Wrist extension [facility] WFL  Wrist ulnar deviation    Wrist radial deviation    Wrist pronation    Wrist supination    (Blank rows = not tested)  UPPER EXTREMITY MMT:     MMT Right eval Left eval  Shoulder flexion 3-/5 3-/5  Shoulder abduction 3-/5 3-/5  Shoulder adduction    Shoulder extension    Shoulder internal rotation    Shoulder external rotation    Middle trapezius    Lower trapezius    Elbow flexion 4/5 4/5  Elbow extension 4-/5 4-/5  Wrist flexion 4-/5 4-/5  Wrist extension 4-/5 4-/5  Wrist ulnar deviation    Wrist radial deviation    Wrist pronation    Wrist supination    (Blank rows = not tested)  HAND FUNCTION: Grip strength: Right: 38 lbs; Left: 27 lbs, Lateral pinch: Right: 13 lbs, Left: 14 lbs, and 3 point pinch: Right: 16 lbs, Left:  13 lbs  COORDINATION: 9 Hole Peg test: Right: 55 sec; Left: 47 sec  SENSATION: Light Touch: TBD Proprioception: TBD  EDEMA: N/A  MUSCLE TONE: Ataxia  COGNITION: Overall cognitive status: Within functional limits for tasks assessed   Caregiver reports becomes agitated more easily    VISION ASSESSMENT:   Glasses  PERCEPTION: Intact  PRAXIS: Impaired: Motor planning   TODAY'S TREATMENT:                                                                                                                              DATE: 02/05/2023   Therapeutic Exercise:  Pt. performed bilateral gross gripping with a gross  grip strengthener. Pt. worked on sustaining grip while grasping pegs and reaching at various heights. The gripper was set to 17.9 # of grip strength resistance. Pt. worked on pinch strengthening in the left hand for lateral, and 3pt. pinch using yellow, red, green, and blue resistive clips. Pt. worked on placing the clips at various vertical and horizontal angles. Tactile and verbal cues were required for eliciting the lateral and 3pt. pinch strengthening.  Neuromuscular re-education:  Pt. performed [facility] tasks using the Grooved pegboard. Pt. worked on grasping the grooved pegs from a horizontal position, and moving the pegs to a vertical position in the hand to prepare for placing them in the grooved slot.  Pt. Worked on removing the pegs by grasping, and storing them one at a time in the palm of his hand, followed by dropping them one a time from the palm of his hand.    PATIENT EDUCATION: Education details:  UE strengthening Person educated: Patient and Child psychotherapist: Explanation, Demonstration, and Verbal cues Education comprehension: verbalized understanding, returned demonstration, verbal cues required, and needs further education  HOME EXERCISE PROGRAM:   To continue to assess, and provide as needed   GOALS: Goals reviewed with patient?  Yes  SHORT TERM GOALS: Target date: 02/13/2023    Pt. Will require supervision with HEPs for BE UEs. Baseline: Eval: No current HEP Goal status: INITIAL  LONG TERM GOALS: Target date: 03/27/2023    Pt. Will assume upright sitting with with minimal cues for the duration of a 15 min. Task.  Baseline: Pt. with positive left sided leaning. Goal status: INITIAL  2.  Pt. Will increase bilateral shoulder flexion ROM by 10 degrees to assist with bathing/washing his hair Baseline: Eval: shoulder flexion: Right: 74(100) Left: 80(122)  Goal status: INITIAL  3.  Pt. Will improve bilateral shoulder abduction ROM by 10 degrees to assist with UE dressing skills. Baseline: Eval: shoulder abduction: Right: 73(94), Left: 70(98) Goal status: INITIAL  4.  Pt. Will improve left grip strength by 5# to be able to hold items securely Baseline: Eval: Right: 38# Left: 27# Goal status: INITIAL  5.  Pt. Will improve [facility] skills by 10 sec to be able to independently, and efficiently manipulate small items. Baseline: Eval: Left 55 sec Goal status: INITIAL  6.  Pt. will increase FOTO score by 2 points to reflect Pt. perceived improvement with assessment specific ADL/IADL's.  Baseline: Eval: 73 Goal status: INITIAL  ASSESSMENT:  CLINICAL IMPRESSION:  Pt. tolerated the UE exercises well, however requires consistent cues, and assist to formulate 3pt. pinch, and lateral pinch position. Pt. required cues to avoid using the opposite hand to perform the task. Pt. presented with difficulty dropping one grooved peg at a time from the palm of his hand back into the container.  Pt. continues to benefit from OT services for ADL training, A/E training, UE there. Ex., neuromuscular re-education, and pt./caregiver education.  PERFORMANCE DEFICITS: in functional skills including ADLs, IADLs, coordination, dexterity, ROM, strength, Fine motor control, Gross motor control, balance, and UE functional use, cognitive skills  including problem solving and safety awareness, and psychosocial skills including coping strategies, environmental adaptation, interpersonal interactions, and routines and behaviors.   IMPAIRMENTS: are limiting patient from ADLs, IADLs, work, play, leisure, and social participation.   CO-MORBIDITIES: has co-morbidities such as Pervasive Developmental Delay Disorder  that affects occupational performance. Patient will benefit from skilled OT to address above impairments and improve overall function.  MODIFICATION OR ASSISTANCE TO COMPLETE EVALUATION:  Min-Moderate modification of tasks or assist with assess necessary to complete an evaluation.  OT OCCUPATIONAL PROFILE AND HISTORY: Detailed assessment: Review of records and additional review of physical, cognitive, psychosocial history related to current functional performance.  CLINICAL DECISION MAKING: Moderate - several treatment options, min-mod task modification necessary  REHAB POTENTIAL: Good  EVALUATION COMPLEXITY: Moderate    PLAN:  OT FREQUENCY: 2x/week  OT DURATION: 12 weeks  PLANNED INTERVENTIONS: 97535 self care/ADL training, 16109 therapeutic exercise, 97530 therapeutic activity, 97112 neuromuscular re-education, 97140 manual therapy, 97010 moist heat, 97034 contrast bath, balance training, patient/family education, and DME and/or AE instructions  RECOMMENDED OTHER SERVICES: PT & ST  CONSULTED AND AGREED WITH PLAN OF CARE: Patient and family member/caregiver  PLAN FOR NEXT SESSION: see above  Olegario Messier, MS, OTR/L   02/05/2023, 9:43 AM

## 2023-02-05 NOTE — Therapy (Signed)
OUTPATIENT PHYSICAL THERAPY NEURO TREATMENT   [name] MRN: [id number] DOB:[birth year], 62 y.o., male Today's Date: 02/05/2023   PCP: Domenic Schwab. Danae Chen, MD REFERRING PROVIDER: Rodney Cruise, MD  END OF SESSION:  PT End of Session - 02/05/23 0929     Visit Number 6    Number of Visits 24    Date for PT Re-Evaluation 03/27/23    Authorization Type Medicare primary; Medicaid secondary    Authorization Time Period 01/02/23-03/27/23    Progress Note Due on Visit 10    PT Start Time 0929    PT Stop Time 1010    PT Time Calculation (min) 41 min    Equipment Utilized During Treatment Gait belt    Activity Tolerance Patient tolerated treatment well    Behavior During Therapy WFL for tasks assessed/performed              Past Medical History:  Diagnosis Date   Depression    Hypertension    MGUS (monoclonal gammopathy of unknown significance)    Pervasive developmental disorder    Seizures (HCC)    SSS (sick sinus syndrome) (HCC)    PPM placed   Past Surgical History:  Procedure Laterality Date   PACEMAKER IMPLANT     Patient Active Problem List   Diagnosis Date Noted   Vertebral artery stenosis 12/18/2022   Left-sided weakness 12/17/2022   MGUS (monoclonal gammopathy of unknown significance) 07/07/2021   Multifocal pneumonia 12/20/2018   Seizure disorder (HCC) 12/20/2018   COVID-19 virus infection 12/20/2018   Acute respiratory failure with hypoxia (HCC) 12/20/2018   Essential hypertension 12/20/2018   Hyponatremia 12/20/2018   Pancytopenia (HCC) 12/20/2018   Developmental delay disorder 12/20/2018   Pneumonia due to COVID-19 virus 12/20/2018   CKD (chronic kidney disease) stage 3, GFR 30-59 ml/min (HCC) 12/20/2018   Age-related nuclear cataract of both eyes 07/18/2017   Early dry stage nonexudative age-related macular degeneration of both eyes 07/18/2017   Myopia with astigmatism and presbyopia, bilateral 07/18/2017   Onychomycosis  06/29/2016   Risk for falls 02/10/2015   Pacemaker 11/06/2014   Septic shock (HCC) 11/06/2014   Dysarthria 10/31/2014   Altered mental status 10/30/2014   Thrombocytopenia (HCC) 10/08/2014   Medication monitoring encounter 04/24/2014   Tremor 04/24/2014   Unsteadiness on feet 04/24/2014   History of nonmelanoma skin cancer 07/22/2013   Colon polyps 07/09/2013   Enuresis, primary, functional 05/09/2013   Gait disturbance 04/17/2013   Hemorrhoid 04/17/2013   Intention tremor 04/17/2013   Actinic keratosis 05/27/2010   Major depressive disorder, single episode, severe, with psychotic behavior (HCC) 03/21/2010   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   Pervasive developmental disorder 03/21/2010   Obsessive-compulsive disorder 03/07/2010   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 He reports no pain today.  Pt accompanied by:  caregiver  PERTINENT HISTORY:  From eval  Pt is a 62 year old male presenting to physical therapy for issues concerning balance and strength. At baseline, pt has a history of intellectual disabilities and lives in a group home. Pt is accompanied by his caretaker where they report he had a "slight stroke" early November where he spent a couple of days at [facility] on the third floor. According to ED notes from his stay (11/9-11/11), pt was  brought in after a syncopal episode where they found no evidence of stroke on the CT, but significant vertebral artery stenosis. Prior to this recent admission, he was living in a group home and independent with most ADL's including getting himself dressed and bathing. Since then, he now requires assistance with basic ADL's and uses a two wheeled rolling walker for ambulation. He reports his left side is now "weaker" since his hospital stay. In the last six months, he has had two falls with both taking place in the bathroom. His caretaker reports one fall was when he was attempting to get out of the shower and hit his head on the sink. He has had two hospital admissions over the last year both related to falls. His typical day consists of going to school at the group home and watching television. He enjoys looking at antique shops in his free time and enjoys home improvement shows.   PMH: sick sinus syndrome, vertebral artery stenosis, pacemaker implant, I/DD, hypertension, and seizure disorder. Pt is referred to OPPT after recent increase in falls, syncope, concern for TIA. According to ED notes from his stay (11/9-11/11), pt was brought in after a syncopal episode where they found no evidence of stroke on the CT, but significant vertebral artery stenosis. While in the ED, pacemaker interrogated, ruled out. At baseline pt was living in a group home and independent with most ADL including getting himself dressed and bathing. Since then, he now requires assistance with basic ADL and uses a two wheeled rolling walker for ambulation. Several ED visits since October due to falls. Pt enjoys looking at antique shops in his free time and watching home improvement shows.    PAIN:  Are you having pain? No and pt reports achy feeling in the morning   PRECAUTIONS: Fall  WEIGHT BEARING RESTRICTIONS: No  FALLS: Has patient fallen in last 6 months? Yes. Number of falls 2 Pt's caretaker reports one fall when trying  to get out of the shower where he hit his head on the sink.   LIVING ENVIRONMENT: Lives with: lives in an adult home Lives in: Group home Stairs: No  Has following equipment at home: Dan Humphreys - 2 wheeled and shower chair rolling wl and shower chair   PLOF: Needs assistance with ADLs   PATIENT GOALS: work on stairs, walk like he used to  OBJECTIVE:  Note: Objective measures were completed at Evaluation unless otherwise noted.  DIAGNOSTIC FINDINGS:   MRI HEAD WITHOUT CONTRAST- 12/18/2022  COMPARISON:  Head CT December 16, 2022.   FINDINGS: Brain: Scattered foci no acute infarction, hemorrhage, hydrocephalus, extra-axial collection or mass lesion. Scattered foci T2 hyperintensity are seen within the white matter of the cerebral hemispheres, nonspecific. Mild parenchymal volume loss.   IMPRESSION: 1. No acute intracranial abnormality. 2. Scattered foci of T2 hyperintensity within the white matter of the cerebral hemispheres, nonspecific but may represent mild  chronic microvascular ischemic changes. 3. Mild parenchymal volume loss.    VITALS BP: 107/67 mmHg in seated HR: 70 bpm   COGNITION: Overall cognitive status: History of cognitive impairments - at baseline   SENSATION: Not tested  POSTURE: forward head and increased thoracic kyphosis   LOWER EXTREMITY MMT:    MMT Right Eval Left Eval  Hip flexion 4 4-  Hip extension    Hip abduction 4-** 4-**  Hip adduction 4* 4*  Hip internal rotation    Hip external rotation    Knee flexion 3+ 3  Knee extension 3+ 3  Ankle dorsiflexion 4 3+  Ankle plantarflexion    Ankle inversion    Ankle eversion    (Blank rows = not tested) **Hip abduction and adduction tested in seated  TRANSFERS: Assistive device utilized: None  Sit to stand: Complete Independence Stand to sit: Complete Independence Chair to chair: Complete Independence   STAIRS: Assess at next visit   GAIT: Gait pattern: two point step to with  rolling walker, decreased stride length, Right foot flat, Left foot flat, trunk flexed, narrow BOS, poor foot clearance- Right, and poor foot clearance- Left Distance walked: >500 ft Assistive device utilized: Environmental consultant - 2 wheeled Level of assistance: SBA for safety  Comments: forward flexed trunk, increased adduction of LE, path deviation to the left, when completing turns pt lifts walker off the ground, narrow BOS, left lateral lean , genuvarus of right LE dynamic in mid stance , trendelenburg on right hip for ligamentous lateral stability in midstance, gait deviations dramatically more deviated after 4 minutes of ambulation resultant cross-over and catching of toes on contralateral heel in swing phase bilaterally   FUNCTIONAL TESTS:  5 times sit to stand: 29.3 with UE on thigh   Timed up and go (TUG): 27.96 with two wheeled walker, 34.21 without AD 6 minute walk test: 520 ft with rolling walker, increased fatigue  Berg Balance Scale:.36/56  PATIENT SURVEYS:  FOTO 36 - completed with assistance from SPT and caretaker   TODAY'S TREATMENT:                                                                                                                              DATE: 01/17/2023    TE:  STS hands free 1x12, 2x10. Rates easy   Standing march with UE support 3x10 each LE Standing hip abd with UE support 3x10 each LE  With 2.5# aw donned each LE: Seated march 2x10 each LE  LAQ 3x10 each LE, VC/TC required to improve ROM Seated heel raise 1x15, 1x20 Bilat LE Ambulation x480 feet with RW and CGA - rates medium Brief rest break Ambulation x148 feet with RW and CGA - cuing for proximity to RW  Mini squats with UE support 2x10  PATIENT EDUCATION: Education details: exercise technique Person educated: Patient and Caregiver   Education method: Explanation Education comprehension: verbalized understanding and needs further education  HOME EXERCISE PROGRAM: Access  Code: [email or web address] URL:  [email or web address] Date: 01/08/2023 Prepared by: Grier Rocher  Exercises - Standing March with Counter Support  - 1 x daily - 7 x weekly - 3 sets - 10 reps - 2 hold - Standing Hip Abduction with Counter Support  - 1 x daily - 7 x weekly - 3 sets - 10 reps - 3 hold - Sit to Stand with Arms Crossed  - 1 x daily - 7 x weekly - 3 sets - 10 reps  GOALS: Goals reviewed with patient? Yes  SHORT TERM GOALS: Target date: 02/13/2023  Patient will be independent in home exercise program to improve strength/mobility for better functional independence with ADLs. Baseline: To be provided next visit  Goal status: INITIAL  2.  N/A Baseline:  Goal status: INITIAL    LONG TERM GOALS: Target date: 03/27/23  Patient will increase FOTO score to equal to or greater than   71  to demonstrate statistically significant improvement in mobility and quality of life.  Baseline: 58 Goal status: INITIAL  2.  Patient (> 57 years old) will complete five times sit to stand test in < 15 seconds indicating an increased LE strength and improved balance. Baseline: 29.33 Goal status: INITIAL  3.  Patient will increase six minute walk test distance to >1000 for progression to community ambulator and improve gait ability Baseline: 520 ft with walker Goal status: INITIAL  4.  Patient will reduce timed up and go to <15 seconds to reduce fall risk and demonstrate improved transfer/gait ability. Baseline: 27.96 with walker, 34.21 without AD Goal status: INITIAL  5.  Patient will increase Berg Balance score by > 6 points to demonstrate decreased fall risk during functional activities. Baseline: 36 Goal status: INITIAL  6.  Patient will ascend/descend 5 stairs with least restrictive assistive device without loss of balance to improve functional mobility.   Baseline: Assess next visit  Goal status: INITIAL  ASSESSMENT:  CLINICAL IMPRESSION: Pt with excellent motivation to participate in  session. He was able to advance level of resistance used with therex without pain or excessive fatigue. Pt will continue to benefit from skilled physical therapy decrease risk of falls, improve quality of life, and maximize function.     OBJECTIVE IMPAIRMENTS: Abnormal gait, decreased activity tolerance, decreased balance, decreased cognition, decreased coordination, decreased endurance, decreased knowledge of use of DME, decreased mobility, difficulty walking, decreased strength, decreased safety awareness, and improper body mechanics.   ACTIVITY LIMITATIONS: carrying, lifting, bending, standing, squatting, stairs, bathing, toileting, and dressing  PARTICIPATION LIMITATIONS: meal prep, cleaning, laundry, medication management, personal finances, driving, shopping, community activity, occupation, and yard work  PERSONAL FACTORS: Behavior pattern and 3+ comorbidities: sick sinus syndrome, vertebral artery stenosis, pacemaker implant, developmental delay disorder, hypertension, and seizure disorder.  are also affecting patient's functional outcome.  REHAB POTENTIAL: Good  CLINICAL DECISION MAKING: Evolving/moderate complexity  EVALUATION COMPLEXITY: Moderate  PLAN:  PT FREQUENCY: 1-2x/week  PT DURATION: 12 weeks  PLANNED INTERVENTIONS: 97110-Therapeutic exercises, 97530- Therapeutic activity, 97112- Neuromuscular re-education, 97535- Self Care, 69629- Manual therapy, [phone]- Gait training, [phone]- Orthotic Fit/training, [phone]- Electrical stimulation (unattended), Patient/Family education, Balance training, Stair training, Joint mobilization, Cryotherapy, and Moist heat  PLAN FOR NEXT SESSION:   develop HEP, LE strengthening dynamic balance and gait without AD     Baird Kay, PT 02/05/2023, 10:13 AM   Physical Therapist - [facility]  10:13 AM 02/05/23

## 2023-02-06 ENCOUNTER — Encounter: Payer: Medicare Other | Admitting: Occupational Therapy

## 2023-02-08 ENCOUNTER — Ambulatory Visit: Payer: Medicare Other

## 2023-02-08 ENCOUNTER — Ambulatory Visit: Payer: Medicare Other | Attending: Student | Admitting: Occupational Therapy

## 2023-02-08 DIAGNOSIS — R278 Other lack of coordination: Secondary | ICD-10-CM | POA: Diagnosis present

## 2023-02-08 DIAGNOSIS — R2689 Other abnormalities of gait and mobility: Secondary | ICD-10-CM | POA: Diagnosis present

## 2023-02-08 DIAGNOSIS — M6281 Muscle weakness (generalized): Secondary | ICD-10-CM

## 2023-02-08 DIAGNOSIS — R262 Difficulty in walking, not elsewhere classified: Secondary | ICD-10-CM

## 2023-02-08 DIAGNOSIS — R1313 Dysphagia, pharyngeal phase: Secondary | ICD-10-CM

## 2023-02-08 DIAGNOSIS — R2681 Unsteadiness on feet: Secondary | ICD-10-CM | POA: Diagnosis present

## 2023-02-08 NOTE — Therapy (Addendum)
 OUTPATIENT OCCUPATIONAL THERAPY NEURO TREATMENT NOTE  [name] MRN: [id number] DOB:[birth year], 63 y.o., male Today's Date: 02/08/2023  PCP: Idella Guido PARAS, MD REFERRING PROVIDER: Zoraida Cadet, MD  END OF SESSION:  OT End of Session - 02/08/23 0815     Visit Number 9    Number of Visits 24    Date for OT Re-Evaluation 03/27/23    OT Start Time 0800    OT Stop Time 0845    OT Time Calculation (min) 45 min    Activity Tolerance Patient tolerated treatment well    Behavior During Therapy [facility] for tasks assessed/performed             Past Medical History:  Diagnosis Date   Depression    Hypertension    MGUS (monoclonal gammopathy of unknown significance)    Pervasive developmental disorder    Seizures (HCC)    SSS (sick sinus syndrome) (HCC)    PPM placed   Past Surgical History:  Procedure Laterality Date   PACEMAKER IMPLANT     Patient Active Problem List   Diagnosis Date Noted   Vertebral artery stenosis 12/18/2022   Left-sided weakness 12/17/2022   MGUS (monoclonal gammopathy of unknown significance) 07/07/2021   Multifocal pneumonia 12/20/2018   Seizure disorder (HCC) 12/20/2018   COVID-19 virus infection 12/20/2018   Acute respiratory failure with hypoxia (HCC) 12/20/2018   Essential hypertension 12/20/2018   Hyponatremia 12/20/2018   Pancytopenia (HCC) 12/20/2018   Developmental delay disorder 12/20/2018   Pneumonia due to COVID-19 virus 12/20/2018   CKD (chronic kidney disease) stage 3, GFR 30-59 ml/min (HCC) 12/20/2018   Age-related nuclear cataract of both eyes 07/18/2017   Early dry stage nonexudative age-related macular degeneration of both eyes 07/18/2017   Myopia with astigmatism and presbyopia, bilateral 07/18/2017   Onychomycosis 06/29/2016   Risk for falls 02/10/2015   Pacemaker 11/06/2014   Septic shock (HCC) 11/06/2014   Dysarthria 10/31/2014   Altered mental status 10/30/2014   Thrombocytopenia (HCC) 10/08/2014    Medication monitoring encounter 04/24/2014   Tremor 04/24/2014   Unsteadiness on feet 04/24/2014   History of nonmelanoma skin cancer 07/22/2013   Colon polyps 07/09/2013   Enuresis, primary, functional 05/09/2013   Gait disturbance 04/17/2013   Hemorrhoid 04/17/2013   Intention tremor 04/17/2013   Actinic keratosis 05/27/2010   Major depressive disorder, single episode, severe, with psychotic behavior (HCC) 03/21/2010   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   Pervasive developmental disorder 03/21/2010   Obsessive-compulsive disorder 03/07/2010   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   Obesity due to excess calories 02/25/2010    ONSET DATE: 12/16/2022  REFERRING DIAG: Left sided weakness  THERAPY DIAG:  Muscle weakness (generalized)  Other lack of coordination  Rationale for Evaluation and Treatment: Rehabilitation  SUBJECTIVE:   SUBJECTIVE STATEMENT:  Pt reports doing well today.   Pt accompanied by:  Personal caregiver, [name]  PERTINENT HISTORY:   Pt. is a 63 y.o. male who was admitted to the hospital with Left sided weakness, and left sided weakness, vertebral artery stenosis, sick sinus syndrome, Essential tremor, Pervasive Developmental Delay Disorder, Pacemaker implant s/p 6-7 years per caregiver. Pt. Resides at [facility], and attends [facility].   PRECAUTIONS: ICD/Pacemaker 6-7 years, Fall, Pureed diet, with sipping cup  WEIGHT BEARING RESTRICTIONS: No  PAIN:  Are you having pain? No  FALLS: Has patient fallen in last 6 months? Yes. Number of falls 2  LIVING ENVIRONMENT: Lives with: [facility] Lives  in: No steps to enter Has following equipment at home: Walker - 2 wheeled and shower chair  PLOF: Independent with basic ADLs  PATIENT GOALS:    To be able to walk like everyone else  OBJECTIVE:  Note: Objective measures were completed at Evaluation unless otherwise noted.  HAND DOMINANCE:  Left  ADLs:  Transfers/ambulation related to ADLs: Eating: Independent uses a sipping cup, pureed diet Grooming: Independent with brushing teeth, caregiver assist with shaving UB Dressing: MinA LB Dressing: Supervision Toileting: Caregiver assist Bathing: Caregiver assist Tub Shower transfers: MinA Equipment: none  IADLs: Shopping: N/A Light housekeeping:  Caregiver Assist now Meal Prep: Pureed/ Group Home prepares all meals Community mobility:  Relies on transportation services Medication management: Caregivers provide medication management Financial management: Caregivers Assist Handwriting: 50% legible  MOBILITY STATUS: Needs Assist: RW and Hx of falls  POSTURE COMMENTS:  Leans to the left in supported  sitting   ACTIVITY TOLERANCE: Activity tolerance: Limited  FUNCTIONAL OUTCOME MEASURES: FOTO: 73  UPPER EXTREMITY ROM:    Active ROM Right eval Left eval  Shoulder flexion 74(100) 80(122)  Shoulder abduction 73(94) 70(98)  Shoulder adduction    Shoulder extension    Shoulder internal rotation    Shoulder external rotation    Elbow flexion [facility] WFL  Elbow extension [facility] WFL  Wrist flexion [facility] WFL  Wrist extension [facility] WFL  Wrist ulnar deviation    Wrist radial deviation    Wrist pronation    Wrist supination    (Blank rows = not tested)  UPPER EXTREMITY MMT:     MMT Right eval Left eval  Shoulder flexion 3-/5 3-/5  Shoulder abduction 3-/5 3-/5  Shoulder adduction    Shoulder extension    Shoulder internal rotation    Shoulder external rotation    Middle trapezius    Lower trapezius    Elbow flexion 4/5 4/5  Elbow extension 4-/5 4-/5  Wrist flexion 4-/5 4-/5  Wrist extension 4-/5 4-/5  Wrist ulnar deviation    Wrist radial deviation    Wrist pronation    Wrist supination    (Blank rows = not tested)  HAND FUNCTION: Grip strength: Right: 38 lbs; Left: 27 lbs, Lateral pinch: Right: 13 lbs, Left: 14 lbs, and 3 point pinch: Right: 16 lbs, Left:  13 lbs  COORDINATION: 9 Hole Peg test: Right: 55 sec; Left: 47 sec  SENSATION: Light Touch: TBD Proprioception: TBD  EDEMA: N/A  MUSCLE TONE: Ataxia  COGNITION: Overall cognitive status: Within functional limits for tasks assessed   Caregiver reports becomes agitated more easily    VISION ASSESSMENT:   Glasses  PERCEPTION: Intact  PRAXIS: Impaired: Motor planning   TODAY'S TREATMENT:                                                                                                                              DATE: 02/08/2023   Therapeutic Exercise:  Pt. performed 2.5# dowel ex. F or  UE strengthening secondary to weakness. Bilateral shoulder flexion, chest presses were performed. Pt. performed bilateral gross gripping with a gross grip strengthener. Pt. worked on sustaining bilateral grip while grasping pegs and placing them into a container placed to the ipsilateral side on the table. The gripper was set to 17.9 # of grip strength resistance. Pt. worked on pinch strengthening in the left hand for lateral, and 3pt. pinch using yellow, red, green, and blue resistive clips. Pt. worked on placing the clips at various vertical and horizontal angles. Tactile and verbal cues were required for eliciting the lateral and 3pt. pinch strengthening.     PATIENT EDUCATION: Education details:  UE strengthening Person educated: Patient and Child Psychotherapist: Explanation, Demonstration, and Verbal cues Education comprehension: verbalized understanding, returned demonstration, verbal cues required, and needs further education  HOME EXERCISE PROGRAM:   To continue to assess, and provide as needed   GOALS: Goals reviewed with patient? Yes  SHORT TERM GOALS: Target date: 02/13/2023    Pt. Will require supervision with HEPs for BE UEs. Baseline: Eval: No current HEP Goal status: INITIAL  LONG TERM GOALS: Target date: 03/27/2023    Pt. Will assume upright sitting with  with minimal cues for the duration of a 15 min. Task.  Baseline: Pt. with positive left sided leaning. Goal status: INITIAL  2.  Pt. Will increase bilateral shoulder flexion ROM by 10 degrees to assist with bathing/washing his hair Baseline: Eval: shoulder flexion: Right: 74(100) Left: 80(122)  Goal status: INITIAL  3.  Pt. Will improve bilateral shoulder abduction ROM by 10 degrees to assist with UE dressing skills. Baseline: Eval: shoulder abduction: Right: 73(94), Left: 70(98) Goal status: INITIAL  4.  Pt. Will improve left grip strength by 5# to be able to hold items securely Baseline: Eval: Right: 38# Left: 27# Goal status: INITIAL  5.  Pt. Will improve [facility] skills by 10 sec to be able to independently, and efficiently manipulate small items. Baseline: Eval: Left 55 sec Goal status: INITIAL  6.  Pt. will increase FOTO score by 2 points to reflect Pt. perceived improvement with assessment specific ADL/IADL's.  Baseline: Eval: 73 Goal status: INITIAL  ASSESSMENT:  CLINICAL IMPRESSION:  Pt. tolerated the UE exercises well, however requires consistent cues, and assist to formulate 3pt. pinch, and lateral pinch position. The gripper was modified on the right from 17.9# to 11.2# to remove  the last few pegs. Pt. required cues to avoid using the opposite hand to perform the task. Pt. Required increased time to complete each task.Pt. continues to benefit from OT services for ADL training, A/E training, UE there. Ex., neuromuscular re-education, and pt./caregiver education.  PERFORMANCE DEFICITS: in functional skills including ADLs, IADLs, coordination, dexterity, ROM, strength, Fine motor control, Gross motor control, balance, and UE functional use, cognitive skills including problem solving and safety awareness, and psychosocial skills including coping strategies, environmental adaptation, interpersonal interactions, and routines and behaviors.   IMPAIRMENTS: are limiting patient from  ADLs, IADLs, work, play, leisure, and social participation.   CO-MORBIDITIES: has co-morbidities such as Pervasive Developmental Delay Disorder  that affects occupational performance. Patient will benefit from skilled OT to address above impairments and improve overall function.  MODIFICATION OR ASSISTANCE TO COMPLETE EVALUATION: Min-Moderate modification of tasks or assist with assess necessary to complete an evaluation.  OT OCCUPATIONAL PROFILE AND HISTORY: Detailed assessment: Review of records and additional review of physical, cognitive, psychosocial history related to current functional performance.  CLINICAL DECISION MAKING: Moderate - several  treatment options, min-mod task modification necessary  REHAB POTENTIAL: Good  EVALUATION COMPLEXITY: Moderate    PLAN:  OT FREQUENCY: 2x/week  OT DURATION: 12 weeks  PLANNED INTERVENTIONS: 97535 self care/ADL training, 02889 therapeutic exercise, 97530 therapeutic activity, 97112 neuromuscular re-education, 97140 manual therapy, 97010 moist heat, 97034 contrast bath, balance training, patient/family education, and DME and/or AE instructions  RECOMMENDED OTHER SERVICES: PT & ST  CONSULTED AND AGREED WITH PLAN OF CARE: Patient and family member/caregiver  PLAN FOR NEXT SESSION: see above  Richardson Otter, MS, OTR/L   02/08/2023, 8:18 AM

## 2023-02-08 NOTE — Therapy (Signed)
 OUTPATIENT PHYSICAL THERAPY NEURO TREATMENT   [name] MRN: [id number] DOB:17-Apr-1960, 63 y.o., male Today's Date: 02/08/2023   PCP: Guido PARAS. Idella, MD REFERRING PROVIDER: Lang FORBES Darner, MD  END OF SESSION:  PT End of Session - 02/08/23 0845     Visit Number 7    Number of Visits 24    Date for PT Re-Evaluation 03/27/23    Authorization Type Medicare primary; Medicaid secondary    Authorization Time Period 01/02/23-03/27/23    Progress Note Due on Visit 10    PT Start Time 0845    PT Stop Time 0926    PT Time Calculation (min) 41 min    Equipment Utilized During Treatment Gait belt    Activity Tolerance Patient tolerated treatment well    Behavior During Therapy WFL for tasks assessed/performed              Past Medical History:  Diagnosis Date   Depression    Hypertension    MGUS (monoclonal gammopathy of unknown significance)    Pervasive developmental disorder    Seizures (HCC)    SSS (sick sinus syndrome) (HCC)    PPM placed   Past Surgical History:  Procedure Laterality Date   PACEMAKER IMPLANT     Patient Active Problem List   Diagnosis Date Noted   Vertebral artery stenosis 12/18/2022   Left-sided weakness 12/17/2022   MGUS (monoclonal gammopathy of unknown significance) 07/07/2021   Multifocal pneumonia 12/20/2018   Seizure disorder (HCC) 12/20/2018   COVID-19 virus infection 12/20/2018   Acute respiratory failure with hypoxia (HCC) 12/20/2018   Essential hypertension 12/20/2018   Hyponatremia 12/20/2018   Pancytopenia (HCC) 12/20/2018   Developmental delay disorder 12/20/2018   Pneumonia due to COVID-19 virus 12/20/2018   CKD (chronic kidney disease) stage 3, GFR 30-59 ml/min (HCC) 12/20/2018   Age-related nuclear cataract of both eyes 07/18/2017   Early dry stage nonexudative age-related macular degeneration of both eyes 07/18/2017   Myopia with astigmatism and presbyopia, bilateral 07/18/2017   Onychomycosis 06/29/2016    Risk for falls 02/10/2015   Pacemaker 11/06/2014   Septic shock (HCC) 11/06/2014   Dysarthria 10/31/2014   Altered mental status 10/30/2014   Thrombocytopenia (HCC) 10/08/2014   Medication monitoring encounter 04/24/2014   Tremor 04/24/2014   Unsteadiness on feet 04/24/2014   History of nonmelanoma skin cancer 07/22/2013   Colon polyps 07/09/2013   Enuresis, primary, functional 05/09/2013   Gait disturbance 04/17/2013   Hemorrhoid 04/17/2013   Intention tremor 04/17/2013   Actinic keratosis 05/27/2010   Major depressive disorder, single episode, severe, with psychotic behavior (HCC) 03/21/2010   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   Pervasive developmental disorder 03/21/2010   Obsessive-compulsive disorder 03/07/2010   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   Obesity due to excess calories 02/25/2010    ONSET DATE: November 2024  REFERRING DIAG:  Diagnosis  G45.9 (ICD-10-CM) - TIA (transient ischemic attack)    THERAPY DIAG:  Muscle weakness (generalized)  Unsteadiness on feet  Difficulty in walking, not elsewhere classified  Rationale for Evaluation and Treatment: Rehabilitation  SUBJECTIVE:  SUBJECTIVE STATEMENT: Pt reports no pain currently. He reports 1 fall in his room when he was up walking. He reports he got back up on his own with no injuries.  Pt accompanied by:  caregiver  PERTINENT HISTORY:  From eval  Pt is a 63 year old male presenting to physical therapy for issues concerning balance and strength. At baseline, pt has a history of intellectual disabilities and lives in a group home. Pt is accompanied by his caretaker where they report he had a slight stroke early November where he spent a couple of days at [facility] on the third floor. According to ED notes from his stay  (11/9-11/11), pt was brought in after a syncopal episode where they found no evidence of stroke on the CT, but significant vertebral artery stenosis. Prior to this recent admission, he was living in a group home and independent with most ADL's including getting himself dressed and bathing. Since then, he now requires assistance with basic ADL's and uses a two wheeled rolling walker for ambulation. He reports his left side is now weaker since his hospital stay. In the last six months, he has had two falls with both taking place in the bathroom. His caretaker reports one fall was when he was attempting to get out of the shower and hit his head on the sink. He has had two hospital admissions over the last year both related to falls. His typical day consists of going to school at the group home and watching television. He enjoys looking at antique shops in his free time and enjoys home improvement shows.   PMH: sick sinus syndrome, vertebral artery stenosis, pacemaker implant, I/DD, hypertension, and seizure disorder. Pt is referred to OPPT after recent increase in falls, syncope, concern for TIA. According to ED notes from his stay (11/9-11/11), pt was brought in after a syncopal episode where they found no evidence of stroke on the CT, but significant vertebral artery stenosis. While in the ED, pacemaker interrogated, ruled out. At baseline pt was living in a group home and independent with most ADL including getting himself dressed and bathing. Since then, he now requires assistance with basic ADL and uses a two wheeled rolling walker for ambulation. Several ED visits since October due to falls. Pt enjoys looking at antique shops in his free time and watching home improvement shows.    PAIN:  Are you having pain? No and pt reports achy feeling in the morning   PRECAUTIONS: Fall  WEIGHT BEARING RESTRICTIONS: No  FALLS: Has patient fallen in last 6 months? Yes. Number of falls 2 Pt's caretaker reports  one fall when trying to get out of the shower where he hit his head on the sink.   LIVING ENVIRONMENT: Lives with: lives in an adult home Lives in: Group home Stairs: No  Has following equipment at home: Vannie - 2 wheeled and shower chair rolling wl and shower chair   PLOF: Needs assistance with ADLs   PATIENT GOALS: work on stairs, walk like he used to  OBJECTIVE:  Note: Objective measures were completed at Evaluation unless otherwise noted.  DIAGNOSTIC FINDINGS:   MRI HEAD WITHOUT CONTRAST- 12/18/2022  COMPARISON:  Head CT December 16, 2022.   FINDINGS: Brain: Scattered foci no acute infarction, hemorrhage, hydrocephalus, extra-axial collection or mass lesion. Scattered foci T2 hyperintensity are seen within the white matter of the cerebral hemispheres, nonspecific. Mild parenchymal volume loss.   IMPRESSION: 1. No acute intracranial abnormality. 2. Scattered foci of T2  hyperintensity within the white matter of the cerebral hemispheres, nonspecific but may represent mild chronic microvascular ischemic changes. 3. Mild parenchymal volume loss.    VITALS BP: 107/67 mmHg in seated HR: 70 bpm   COGNITION: Overall cognitive status: History of cognitive impairments - at baseline   SENSATION: Not tested  POSTURE: forward head and increased thoracic kyphosis   LOWER EXTREMITY MMT:    MMT Right Eval Left Eval  Hip flexion 4 4-  Hip extension    Hip abduction 4-** 4-**  Hip adduction 4* 4*  Hip internal rotation    Hip external rotation    Knee flexion 3+ 3  Knee extension 3+ 3  Ankle dorsiflexion 4 3+  Ankle plantarflexion    Ankle inversion    Ankle eversion    (Blank rows = not tested) **Hip abduction and adduction tested in seated  TRANSFERS: Assistive device utilized: None  Sit to stand: Complete Independence Stand to sit: Complete Independence Chair to chair: Complete Independence   STAIRS: Assess at next visit   GAIT: Gait pattern: two  point step to with rolling walker, decreased stride length, Right foot flat, Left foot flat, trunk flexed, narrow BOS, poor foot clearance- Right, and poor foot clearance- Left Distance walked: >500 ft Assistive device utilized: [employer]ant - 2 wheeled Level of assistance: SBA for safety  Comments: forward flexed trunk, increased adduction of LE, path deviation to the left, when completing turns pt lifts walker off the ground, narrow BOS, left lateral lean , genuvarus of right LE dynamic in mid stance , trendelenburg on right hip for ligamentous lateral stability in midstance, gait deviations dramatically more deviated after 4 minutes of ambulation resultant cross-over and catching of toes on contralateral heel in swing phase bilaterally   FUNCTIONAL TESTS:  5 times sit to stand: 29.3 with UE on thigh   Timed up and go (TUG): 27.96 with two wheeled walker, 34.21 without AD 6 minute walk test: 520 ft with rolling walker, increased fatigue  Berg Balance Scale:.36/56  PATIENT SURVEYS:  FOTO 78 - completed with assistance from SPT and caretaker   TODAY'S TREATMENT:                                                                                                                              DATE: 02/08/23  Gait belt donned throughout for pt safety   TE:  With 2.5# aw donned each LE: LAQ 2x15 each LE, VC/TC required to improve ROM Seated march 2x12 each LE  LAQ 2x15 each LE, VC/TC required to improve ROM Seated heel raise 1x13 Bilat LE - frequent cuing to attend to task/discontinued as pt has difficulty attending to activity/performing with correct technique Seated LTL step-tap onto half-foam 2x10 each LE   STS 4x12 - cuing throughout for safe technique and correct technique. Rest breaks provided between sets due to fatigue. Pt intermittently able to demo correct technique without feedback from PT, completes some reps as  partial reps  Nustep endurance/strength training, levles 3-4 x 4 min with min  assist for mount/dismount/set-up and cuing throughout to continue activity  PATIENT EDUCATION: Education details: exercise technique Person educated: Patient and Caregiver   Education method: Explanation Education comprehension: verbalized understanding and needs further education  HOME EXERCISE PROGRAM: Access Code: [email or web address] URL: [email or web address] Date: 01/08/2023 Prepared by: Massie Dollar  Exercises - Standing March with Counter Support  - 1 x daily - 7 x weekly - 3 sets - 10 reps - 2 hold - Standing Hip Abduction with Counter Support  - 1 x daily - 7 x weekly - 3 sets - 10 reps - 3 hold - Sit to Stand with Arms Crossed  - 1 x daily - 7 x weekly - 3 sets - 10 reps  GOALS: Goals reviewed with patient? Yes  SHORT TERM GOALS: Target date: 02/13/2023  Patient will be independent in home exercise program to improve strength/mobility for better functional independence with ADLs. Baseline: To be provided next visit  Goal status: INITIAL  2.  N/A Baseline:  Goal status: INITIAL    LONG TERM GOALS: Target date: 03/27/23  Patient will increase FOTO score to equal to or greater than   71  to demonstrate statistically significant improvement in mobility and quality of life.  Baseline: 58 Goal status: INITIAL  2.  Patient (> 42 years old) will complete five times sit to stand test in < 15 seconds indicating an increased LE strength and improved balance. Baseline: 29.33 Goal status: INITIAL  3.  Patient will increase six minute walk test distance to >1000 for progression to community ambulator and improve gait ability Baseline: 520 ft with walker Goal status: INITIAL  4.  Patient will reduce timed up and go to <15 seconds to reduce fall risk and demonstrate improved transfer/gait ability. Baseline: 27.96 with walker, 34.21 without AD Goal status: INITIAL  5.  Patient will increase Berg Balance score by > 6 points to demonstrate decreased fall risk during  functional activities. Baseline: 36 Goal status: INITIAL  6.  Patient will ascend/descend 5 stairs with least restrictive assistive device without loss of balance to improve functional mobility.   Baseline: Assess next visit  Goal status: INITIAL  ASSESSMENT:  CLINICAL IMPRESSION: Pt required more cuing today to attend to task/activities and to complete interventions with correct technique. He was able to complete increased volume of several exercises, including STS, indicating improved LE strength/endurance. Pt will continue to benefit from skilled physical therapy decrease risk of falls, improve quality of life, and maximize function.     OBJECTIVE IMPAIRMENTS: Abnormal gait, decreased activity tolerance, decreased balance, decreased cognition, decreased coordination, decreased endurance, decreased knowledge of use of DME, decreased mobility, difficulty walking, decreased strength, decreased safety awareness, and improper body mechanics.   ACTIVITY LIMITATIONS: carrying, lifting, bending, standing, squatting, stairs, bathing, toileting, and dressing  PARTICIPATION LIMITATIONS: meal prep, cleaning, laundry, medication management, personal finances, driving, shopping, community activity, occupation, and yard work  PERSONAL FACTORS: Behavior pattern and 3+ comorbidities: sick sinus syndrome, vertebral artery stenosis, pacemaker implant, developmental delay disorder, hypertension, and seizure disorder.  are also affecting patient's functional outcome.  REHAB POTENTIAL: Good  CLINICAL DECISION MAKING: Evolving/moderate complexity  EVALUATION COMPLEXITY: Moderate  PLAN:  PT FREQUENCY: 1-2x/week  PT DURATION: 12 weeks  PLANNED INTERVENTIONS: 97110-Therapeutic exercises, 97530- Therapeutic activity, 97112- Neuromuscular re-education, 97535- Self Care, [id number]- Manual therapy, [phone]- Gait training, [phone]- Orthotic Fit/training, [phone]- Electrical stimulation (unattended), Patient/Family  education, Balance training, Stair training, Joint  mobilization, Cryotherapy, and Moist heat  PLAN FOR NEXT SESSION:   develop HEP, LE strengthening dynamic balance and gait without AD     Darryle JONELLE Patten, PT 02/08/2023, 9:35 AM   Physical Therapist - [facility]  9:35 AM 02/08/23

## 2023-02-08 NOTE — Therapy (Addendum)
 OUTPATIENT SPEECH LANGUAGE PATHOLOGY  SWALLOW EVALUATION   [name] MRN: [id number] DOB:08/09/1960, 63 y.o., male Today's Date: 02/08/2023  PCP: Guido Brome, MD REFERRING PROVIDER: Lang Darner, MD   End of Session - 02/08/23 1154     Visit Number 1    Number of Visits 12    Date for SLP Re-Evaluation 05/03/23    SLP Start Time 0930    SLP Stop Time  1000    SLP Time Calculation (min) 30 min    Activity Tolerance Patient tolerated treatment well             Past Medical History:  Diagnosis Date   Depression    Hypertension    MGUS (monoclonal gammopathy of unknown significance)    Pervasive developmental disorder    Seizures (HCC)    SSS (sick sinus syndrome) (HCC)    PPM placed   Past Surgical History:  Procedure Laterality Date   PACEMAKER IMPLANT     Patient Active Problem List   Diagnosis Date Noted   Vertebral artery stenosis 12/18/2022   Left-sided weakness 12/17/2022   MGUS (monoclonal gammopathy of unknown significance) 07/07/2021   Multifocal pneumonia 12/20/2018   Seizure disorder (HCC) 12/20/2018   COVID-19 virus infection 12/20/2018   Acute respiratory failure with hypoxia (HCC) 12/20/2018   Essential hypertension 12/20/2018   Hyponatremia 12/20/2018   Pancytopenia (HCC) 12/20/2018   Developmental delay disorder 12/20/2018   Pneumonia due to COVID-19 virus 12/20/2018   CKD (chronic kidney disease) stage 3, GFR 30-59 ml/min (HCC) 12/20/2018   Age-related nuclear cataract of both eyes 07/18/2017   Early dry stage nonexudative age-related macular degeneration of both eyes 07/18/2017   Myopia with astigmatism and presbyopia, bilateral 07/18/2017   Onychomycosis 06/29/2016   Risk for falls 02/10/2015   Pacemaker 11/06/2014   Septic shock (HCC) 11/06/2014   Dysarthria 10/31/2014   Altered mental status 10/30/2014   Thrombocytopenia (HCC) 10/08/2014   Medication monitoring encounter 04/24/2014   Tremor 04/24/2014    Unsteadiness on feet 04/24/2014   History of nonmelanoma skin cancer 07/22/2013   Colon polyps 07/09/2013   Enuresis, primary, functional 05/09/2013   Gait disturbance 04/17/2013   Hemorrhoid 04/17/2013   Intention tremor 04/17/2013   Actinic keratosis 05/27/2010   Major depressive disorder, single episode, severe, with psychotic behavior (HCC) 03/21/2010   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   Pervasive developmental disorder 03/21/2010   Obsessive-compulsive disorder 03/07/2010   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   Obesity due to excess calories 02/25/2010    ONSET DATE: 02/01/23 (referral date)  REFERRING DIAG: TIA, dysphagia  THERAPY DIAG:  Dysphagia, pharyngeal phase  Rationale for Evaluation and Treatment Rehabilitation  SUBJECTIVE:   SUBJECTIVE STATEMENT: Frank Conley alert, pleasant, and cooperative. Frank Conley accompanied by: self  PERTINENT HISTORY: Frank Conley is a 55 male who presents for a swallowing evaluation. Frank Conley with hx of developmental disorder, TIA, MGUS, depression, and sz. Frank Conley known to SLP services and most recently seen at [facility] on 12/18/22. At that time, a mech soft diet with thin liquids was recommended as well as use of Provale cup.  MBSS 12/23/2018 Frank Conley presents with a mild dysphagia due in large part to osteophytes at C4-6. Under typical circumstances, when healthy, he is likely better able to compensate for this structural impedence. Oral phase of swallow was normal. As materials passed into pharynx, they tended to sit on the shelf created by osteophytes.  After the swallow, some of this residue was observed to spill into the larynx. Amount  of penetrate was trace, and when it reached the vocal folds, Frank Conley cleared his throat or coughed, which effectively expelled the penetrate from the laryngeal vestibule. There was no observed aspiration... Frank Conley resides in [facility].  DIAGNOSTIC FINDINGS: as above  PAIN:  Are you having pain? No  FALLS: Has patient fallen in last 6  months?  Defer to Frank Conley/OT  LIVING ENVIRONMENT: Lives with: lives in an adult home Lives in: Group home,   PLOF:  Level of assistance: Comment: indep with basic ADLs  PATIENT GOALS  to eat regular food  OBJECTIVE:  RECOMMENDATIONS FROM OBJECTIVE SWALLOW STUDY (MBSS/FEES):   As above - completed in 2020  COGNITION: Overall cognitive status: History of cognitive impairments - at baseline   ORAL MOTOR EXAMINATION WFL  CLINICAL SWALLOW ASSESSMENT:   Current diet: Dysphagia 3 (mechanical soft), Dysphagia 1 (puree), and thin liquids Dentition: adequate natural dentition Feeding: able to feed self and able to feed self with adaptive devices Consistencies tested: Thin Liquid: Presentation: Cup and Straw Oral Phase: WFL Pharyngeal Phase: Impaired: immediate cough Puree: Presentation: Spoon Oral Phase: WFL Pharyngeal Phase: WFL Regular: Presentation: By hand and Self-fed Oral Phase: WFL Pharyngeal Phase: WFL  Aspiration risk factors:History of dysphagia and Cognitive impairment Overall aspiration risk:Mild Diet Recommendations: Dysphagia 3 (mechanical soft) and thin liquids Precautions:Minimize environmental distractions, Slow rate, Small sips/bites, Seated upright 90 degrees, Remain upright for at least 30 minutes after meals, and Other: liquids via single straw sips or Provale cup Supervision: Patient able to feed self and Full supervision/cueing for compensatory strategies Oral care recommendations:Oral care BID Follow-up recommendations: MBSS     PATIENT REPORTED OUTCOME MEASURES (PROM): Unable to complete     PATIENT EDUCATION: Education details: role of SLP, results of assessment, diet recommendations, safe swallowing strategies/aspiration precautions Person educated: Patient Education method: Explanation and Handouts Education comprehension: verbalized understanding and needs further education   GOALS: Goals reviewed with patient? Yes  SHORT TERM GOALS: Target  date: 10 sessions  The patient will consume cup sips of thin liquids without overt s/sx of aspiration in 80% of opportunities given intermittent moderate verbal cues and intermittent minimal tactile cues to utilize swallowing strategies. Baseline: Goal status: INITIAL  2.  Frank Conley will participate in MBSS to further evaluate oropharyngeal swallow function with additional goals added as appropriate. Baseline:  Goal status: INITIAL   LONG TERM GOALS: Target date: 05/03/2023  Frank Conley will tolerate least restrictive diet with cueing, as needed. Baseline:  Goal status: INITIAL    ASSESSMENT:  CLINICAL IMPRESSION: Frank Conley is a 40 male who presents for a swallowing evaluation. Frank Conley with hx of developmental disorder, TIA, MGUS, depression, and sz. Frank Conley known to SLP services and most recently seen at [facility] on 12/18/22. At that time, a mech soft diet with thin liquids was recommended as well as use of Provale cup. MBSS 12/23/2018 Frank Conley presents with a mild dysphagia due in large part to osteophytes at C4-6. Under typical circumstances, when healthy, he is likely better able to compensate for this structural impedence. Oral phase of swallow was normal. As materials passed into pharynx, they tended to sit on the shelf created by osteophytes.  After the swallow, some of this residue was observed to spill into the larynx. Amount of penetrate was trace, and when it reached the vocal folds, Frank Conley cleared his throat or coughed, which effectively expelled the penetrate from the laryngeal vestibule. There was no observed aspiration... Frank Conley resides in a group home. Frank Conley endorsed utilizing Provale cup  at home. It was not brought to ST evaluation this date. Frank Conley participated in clinical swallowing evaluation. Oral phase, although functional, was c/b rapid rate of intake and tendency to consume large bites of solids and puree as well as thin liquids via cup. Frank Conley with tendency to take smaller, single sips via straw sips. Concern for pharyngeal  dysphagia given immediate cough following large cup sip of thin liquid. Otherwise, pharyngeal swallow appeared [facility] per clinical assessment. Recommend a mech soft diet with thin liquids with safe swallowing strtegies/aspiration precuations outlined above. Recommend MBSS to further evaluate oropharyngeal swallow function to set additional goals, as indicated.  OBJECTIVE IMPAIRMENTS include dysphagia. These impairments are limiting patient from safety when swallowing. Factors affecting potential to achieve goals and functional outcome are ability to learn/carryover information, co-morbidities, and chronicity . Patient will benefit from skilled SLP services to address above impairments and improve overall function.  REHAB POTENTIAL: Fair  PLAN: SLP FREQUENCY: 1x/week  SLP DURATION: 12 weeks  PLANNED INTERVENTIONS: Aspiration precaution training, Diet toleration management , Cueing hierachy, Internal/external aids, SLP instruction and feedback, Compensatory strategies, Patient/family education, and MBS Study    Delon Bangs, M.S., CCC-SLP Speech-Language Pathologist [place] [phone] [place])  [place] [facility] at [facility] [address] [place], [place], [address] Phone: [phone]   Fax:  [phone]

## 2023-02-13 ENCOUNTER — Ambulatory Visit: Payer: Medicare Other | Admitting: Occupational Therapy

## 2023-02-13 DIAGNOSIS — M6281 Muscle weakness (generalized): Secondary | ICD-10-CM

## 2023-02-13 DIAGNOSIS — R278 Other lack of coordination: Secondary | ICD-10-CM

## 2023-02-13 NOTE — Therapy (Signed)
 Occupational Therapy Progress Note  Dates of reporting period  01/02/2023   to   02/13/2023   [name] MRN: [id number] DOB:[birth year], 63 y.o., male Today's Date: 02/13/2023  PCP: Idella Guido PARAS, MD REFERRING PROVIDER: Zoraida Cadet, MD  END OF SESSION:  OT End of Session - 02/13/23 0811     Visit Number 10    Number of Visits 24    Date for OT Re-Evaluation 03/27/23    OT Start Time 0805    OT Stop Time 0845    OT Time Calculation (min) 40 min    Activity Tolerance Patient tolerated treatment well    Behavior During Therapy [facility] for tasks assessed/performed             Past Medical History:  Diagnosis Date   Depression    Hypertension    MGUS (monoclonal gammopathy of unknown significance)    Pervasive developmental disorder    Seizures (HCC)    SSS (sick sinus syndrome) (HCC)    PPM placed   Past Surgical History:  Procedure Laterality Date   PACEMAKER IMPLANT     Patient Active Problem List   Diagnosis Date Noted   Vertebral artery stenosis 12/18/2022   Left-sided weakness 12/17/2022   MGUS (monoclonal gammopathy of unknown significance) 07/07/2021   Multifocal pneumonia 12/20/2018   Seizure disorder (HCC) 12/20/2018   COVID-19 virus infection 12/20/2018   Acute respiratory failure with hypoxia (HCC) 12/20/2018   Essential hypertension 12/20/2018   Hyponatremia 12/20/2018   Pancytopenia (HCC) 12/20/2018   Developmental delay disorder 12/20/2018   Pneumonia due to COVID-19 virus 12/20/2018   CKD (chronic kidney disease) stage 3, GFR 30-59 ml/min (HCC) 12/20/2018   Age-related nuclear cataract of both eyes 07/18/2017   Early dry stage nonexudative age-related macular degeneration of both eyes 07/18/2017   Myopia with astigmatism and presbyopia, bilateral 07/18/2017   Onychomycosis 06/29/2016   Risk for falls 02/10/2015   Pacemaker 11/06/2014   Septic shock (HCC) 11/06/2014   Dysarthria 10/31/2014   Altered mental status 10/30/2014    Thrombocytopenia (HCC) 10/08/2014   Medication monitoring encounter 04/24/2014   Tremor 04/24/2014   Unsteadiness on feet 04/24/2014   History of nonmelanoma skin cancer 07/22/2013   Colon polyps 07/09/2013   Enuresis, primary, functional 05/09/2013   Gait disturbance 04/17/2013   Hemorrhoid 04/17/2013   Intention tremor 04/17/2013   Actinic keratosis 05/27/2010   Major depressive disorder, single episode, severe, with psychotic behavior (HCC) 03/21/2010   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   Pervasive developmental disorder 03/21/2010   Obsessive-compulsive disorder 03/07/2010   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 was inquiring about his diet this morning, reporting that he would like to resume going to [employer] again, as he really enjoyed it.    Pt accompanied by:  Self  PERTINENT HISTORY:   Pt. is a 63 y.o. male who was admitted to the hospital with Left sided weakness, and left sided weakness, vertebral artery stenosis, sick sinus syndrome, Essential tremor, Pervasive Developmental Delay Disorder, Pacemaker implant s/p 6-7 years per caregiver. Pt. Resides at [facility], and attends [facility].   PRECAUTIONS: ICD/Pacemaker 6-7 years, Fall, Pureed diet, with sipping cup  WEIGHT BEARING RESTRICTIONS: No  PAIN:  Are you having pain? No  FALLS: Has patient fallen in last 6 months? Yes. Number of falls 2  LIVING ENVIRONMENT: Lives with: [facility] Lives in: No steps to enter Has following equipment at home: Walker - 2 wheeled and shower chair  PLOF: Independent with basic ADLs  PATIENT GOALS:    To be able to walk like everyone  else  OBJECTIVE:  Note: Objective measures were completed at Evaluation unless otherwise noted.  HAND DOMINANCE: Left  ADLs:  Transfers/ambulation related to ADLs: Eating: Independent uses a sipping cup, pureed diet Grooming: Independent with brushing teeth, caregiver assist with shaving UB Dressing: MinA LB Dressing: Supervision Toileting: Caregiver assist Bathing: Caregiver assist Tub Shower transfers: MinA Equipment: none  IADLs: Shopping: N/A Light housekeeping:  Caregiver Assist now Meal Prep: Pureed/ Group Home prepares all meals Community mobility:  Relies on transportation services Medication management: Caregivers provide medication management Financial management: Caregivers Assist Handwriting: 50% legible  MOBILITY STATUS: Needs Assist: RW and Hx of falls  POSTURE COMMENTS:  Leans to the left in supported  sitting   ACTIVITY TOLERANCE: Activity tolerance: Limited  FUNCTIONAL OUTCOME MEASURES: FOTO: 73  UPPER EXTREMITY ROM:    Active ROM Right eval Right 02/13/23 Left eval Right 02/13/23  Shoulder flexion 74(100) 95(110) 80(122) 94(125)  Shoulder abduction 73(94) 84(98) 70(98) 90(110)  Shoulder adduction      Shoulder extension      Shoulder internal rotation      Shoulder external rotation      Elbow flexion [facility] WFL  Elbow extension [facility] WFL  Wrist flexion [facility] WFL  Wrist extension [facility] WFL  Wrist ulnar deviation      Wrist radial deviation      Wrist pronation      Wrist supination      (Blank rows = not tested)  UPPER EXTREMITY MMT:     MMT Right eval Right 02/13/23 Left eval Left 02/13/23  Shoulder flexion 3-/5 3/5 3-/5 3/5  Shoulder abduction 3-/5 3/5 3-/5 3/5  Shoulder adduction      Shoulder extension      Shoulder internal rotation      Shoulder external rotation      Middle trapezius      Lower trapezius      Elbow flexion 4/5 4/5 4/5 4/5  Elbow extension 4-/5 4/5 4-/5 4/5  Wrist flexion 4-/5  4/5 4-/5 4/5  Wrist extension 4-/5 4/5 4-/5 4/5  Wrist ulnar deviation      Wrist radial deviation      Wrist pronation      Wrist supination      (Blank rows = not tested)  HAND FUNCTION: Grip strength: Right: 38 lbs; Left: 27 lbs, Lateral pinch: Right: 13 lbs, Left: 14 lbs, and 3 point pinch: Right: 16 lbs, Left: 13 lbs  02/13/2023:  Grip strength: Right: 30 lbs; Left: 27 lbs, Lateral pinch: Right: 16 lbs, Left: 14 lbs, and 3 point pinch: Right: 18 lbs, Left: 14 lbs   COORDINATION: 9 Hole Peg test: Right: 55 sec; Left: 47 sec  02/13/2023:  9 Hole Peg test: Right: 1 min.; Left: 1 min. & 6 sec  SENSATION: Light Touch: TBD Proprioception: TBD  EDEMA: N/A  MUSCLE TONE: Ataxia  COGNITION: Overall cognitive status: Within functional limits for tasks assessed   Caregiver reports becomes agitated more easily    VISION ASSESSMENT:   Glasses  PERCEPTION: Intact  PRAXIS: Impaired: Motor planning  TODAY'S TREATMENT:                                                                                                                              DATE: 02/13/2023   Measurements were obtained, and goals were reviewed with Pt.    PATIENT EDUCATION: Education details:  UE strengthening Person educated: Patient and Child Psychotherapist: Explanation, Demonstration, and Verbal cues Education comprehension: verbalized understanding, returned demonstration, verbal cues required, and needs further education  HOME EXERCISE PROGRAM:   To continue to assess, and provide as needed   GOALS: Goals reviewed with patient? Yes  SHORT TERM GOALS: Target date: 02/13/2023    Pt. Will require supervision with HEPs for BE UEs. Baseline: 02/13/23:  Eval: No current HEP Goal status: INITIAL  LONG TERM GOALS: Target date: 03/27/2023    Pt. Will assume upright sitting with minimal cues for the duration of a 15 min. Task.  Baseline: 02/13/2023: Pt. Is improving with upright  midline sitting, and presents with less left sided leaning. Eval: Pt. with positive left sided leaning. Goal status:  Progressing/Ongoing   2.  Pt. Will increase bilateral shoulder flexion ROM by 10 degrees to assist with bathing/washing his hair Baseline: 02/13/2023: shoulder flexion: Right: 95(110) Left: 94(125) Eval: shoulder flexion: Right: 74(100) Left: 19(877)  Goal status: Progressing/Ongoing  3.  Pt. Will improve bilateral shoulder abduction ROM by 10 degrees to assist with UE dressing skills. Baseline: 02/13/2023: shoulder abduction: Right: 84(95), Left: 90(110) Eval: shoulder abduction: Right: 73(94), Left: 70(98) Goal status: INITIAL  4.  Pt. Will improve left grip strength by 5# to be able to hold items securely Baseline: 02/13/2023: Right: 30# Left: 27# Eval: Right: 38# Left: 27# Goal status:  Ongoing  5.  Pt. Will improve [facility] skills by 10 sec to be able to independently, and efficiently manipulate small items. Baseline: 02/13/23: Left 1 min. & 6 sec. Eval: Left 55 sec Goal status Ongoing  6.  Pt. will increase FOTO score by 2 points to reflect Pt. perceived improvement with assessment specific ADL/IADL's.  Baseline: 02/13/23: 57 Eval: 73 Goal status: Ongoing  ASSESSMENT:  CLINICAL IMPRESSION:  Measurements were obtained, and goals were reviewed with the Pt. Pt. has made excellent progress with bilateral shoulder ROM. Pt. has improved with BUE strength. FOTO score 57. Pt. continues to work on improving bilateral grip strength, pinch strength, and [facility] skills in order to be able to hold items more securely, and manipulate small objects.  Pt. continues to benefit from OT services for ADL training, A/E training, UE there. Ex., neuromuscular re-education, and pt./caregiver education.  PERFORMANCE DEFICITS: in functional skills including ADLs, IADLs, coordination, dexterity, ROM, strength, Fine motor control, Gross motor control, balance, and UE functional use, cognitive skills  including problem solving and safety awareness, and psychosocial skills including coping strategies, environmental adaptation, interpersonal interactions, and routines and behaviors.   IMPAIRMENTS: are limiting patient from ADLs, IADLs, work, play, leisure, and social  participation.   CO-MORBIDITIES: has co-morbidities such as Pervasive Developmental Delay Disorder  that affects occupational performance. Patient will benefit from skilled OT to address above impairments and improve overall function.  MODIFICATION OR ASSISTANCE TO COMPLETE EVALUATION: Min-Moderate modification of tasks or assist with assess necessary to complete an evaluation.  OT OCCUPATIONAL PROFILE AND HISTORY: Detailed assessment: Review of records and additional review of physical, cognitive, psychosocial history related to current functional performance.  CLINICAL DECISION MAKING: Moderate - several treatment options, min-mod task modification necessary  REHAB POTENTIAL: Good  EVALUATION COMPLEXITY: Moderate    PLAN:  OT FREQUENCY: 2x/week  OT DURATION: 12 weeks  PLANNED INTERVENTIONS: 97535 self care/ADL training, 02889 therapeutic exercise, 97530 therapeutic activity, 97112 neuromuscular re-education, 97140 manual therapy, 97010 moist heat, 97034 contrast bath, balance training, patient/family education, and DME and/or AE instructions  RECOMMENDED OTHER SERVICES: PT & ST  CONSULTED AND AGREED WITH PLAN OF CARE: Patient and family member/caregiver  PLAN FOR NEXT SESSION: see above  Richardson Otter, MS, OTR/L   02/13/2023, 8:12 AM

## 2023-02-15 ENCOUNTER — Ambulatory Visit: Payer: Medicare Other | Admitting: Occupational Therapy

## 2023-02-15 DIAGNOSIS — M6281 Muscle weakness (generalized): Secondary | ICD-10-CM | POA: Diagnosis not present

## 2023-02-15 DIAGNOSIS — R278 Other lack of coordination: Secondary | ICD-10-CM

## 2023-02-15 NOTE — Therapy (Signed)
 Occupational Therapy Treatment Note   [name] MRN: [id number] DOB:Nov 05, 1960, 63 y.o., male Today's Date: 02/15/2023  PCP: Idella Guido PARAS, MD REFERRING PROVIDER: Zoraida Cadet, MD  END OF SESSION:  OT End of Session - 02/15/23 1338     Visit Number 11    Date for OT Re-Evaluation 03/27/23    OT Start Time 1320    OT Stop Time 1400    OT Time Calculation (min) 40 min    Activity Tolerance Patient tolerated treatment well    Behavior During Therapy WFL for tasks assessed/performed             Past Medical History:  Diagnosis Date   Depression    Hypertension    MGUS (monoclonal gammopathy of unknown significance)    Pervasive developmental disorder    Seizures (HCC)    SSS (sick sinus syndrome) (HCC)    PPM placed   Past Surgical History:  Procedure Laterality Date   PACEMAKER IMPLANT     Patient Active Problem List   Diagnosis Date Noted   Vertebral artery stenosis 12/18/2022   Left-sided weakness 12/17/2022   MGUS (monoclonal gammopathy of unknown significance) 07/07/2021   Multifocal pneumonia 12/20/2018   Seizure disorder (HCC) 12/20/2018   COVID-19 virus infection 12/20/2018   Acute respiratory failure with hypoxia (HCC) 12/20/2018   Essential hypertension 12/20/2018   Hyponatremia 12/20/2018   Pancytopenia (HCC) 12/20/2018   Developmental delay disorder 12/20/2018   Pneumonia due to COVID-19 virus 12/20/2018   CKD (chronic kidney disease) stage 3, GFR 30-59 ml/min (HCC) 12/20/2018   Age-related nuclear cataract of both eyes 07/18/2017   Early dry stage nonexudative age-related macular degeneration of both eyes 07/18/2017   Myopia with astigmatism and presbyopia, bilateral 07/18/2017   Onychomycosis 06/29/2016   Risk for falls 02/10/2015   Pacemaker 11/06/2014   Septic shock (HCC) 11/06/2014   Dysarthria 10/31/2014   Altered mental status 10/30/2014   Thrombocytopenia (HCC) 10/08/2014   Medication monitoring encounter  04/24/2014   Tremor 04/24/2014   Unsteadiness on feet 04/24/2014   History of nonmelanoma skin cancer 07/22/2013   Colon polyps 07/09/2013   Enuresis, primary, functional 05/09/2013   Gait disturbance 04/17/2013   Hemorrhoid 04/17/2013   Intention tremor 04/17/2013   Actinic keratosis 05/27/2010   Major depressive disorder, single episode, severe, with psychotic behavior (HCC) 03/21/2010   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   Pervasive developmental disorder 03/21/2010   Obsessive-compulsive disorder 03/07/2010   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 Reports  having had an appointment today   Pt accompanied by:  Self  PERTINENT HISTORY:   Pt. is a 63 y.o. male who was admitted to the hospital with Left sided weakness, and left sided weakness, vertebral artery stenosis, sick sinus syndrome, Essential tremor, Pervasive Developmental Delay Disorder, Pacemaker implant s/p 6-7 years per caregiver. Pt. Resides at [facility], and attends [facility].   PRECAUTIONS: ICD/Pacemaker 6-7 years, Fall, Pureed diet, with sipping cup  WEIGHT BEARING RESTRICTIONS: No  PAIN:  Are you having pain? No  FALLS: Has patient fallen in last 6 months? Yes. Number of falls 2  LIVING ENVIRONMENT: Lives with: [facility] Lives in: No steps to enter Has following  equipment at home: Vannie - 2 wheeled and shower chair  PLOF: Independent with basic ADLs  PATIENT GOALS:    To be able to walk like everyone else  OBJECTIVE:  Note: Objective measures were completed at Evaluation unless otherwise noted.  HAND DOMINANCE: Left  ADLs:  Transfers/ambulation related to  ADLs: Eating: Independent uses a sipping cup, pureed diet Grooming: Independent with brushing teeth, caregiver assist with shaving UB Dressing: MinA LB Dressing: Supervision Toileting: Caregiver assist Bathing: Caregiver assist Tub Shower transfers: MinA Equipment: none  IADLs: Shopping: N/A Light housekeeping:  Caregiver Assist now Meal Prep: Pureed/ Group Home prepares all meals Community mobility:  Relies on transportation services Medication management: Caregivers provide medication management Financial management: Caregivers Assist Handwriting: 50% legible  MOBILITY STATUS: Needs Assist: RW and Hx of falls  POSTURE COMMENTS:  Leans to the left in supported  sitting   ACTIVITY TOLERANCE: Activity tolerance: Limited  FUNCTIONAL OUTCOME MEASURES: FOTO: 73  UPPER EXTREMITY ROM:    Active ROM Right eval Right 02/13/23 Left eval Right 02/13/23  Shoulder flexion 74(100) 95(110) 80(122) 94(125)  Shoulder abduction 73(94) 84(98) 70(98) 90(110)  Shoulder adduction      Shoulder extension      Shoulder internal rotation      Shoulder external rotation      Elbow flexion [facility] WFL  Elbow extension [facility] WFL  Wrist flexion [facility] WFL  Wrist extension [facility] WFL  Wrist ulnar deviation      Wrist radial deviation      Wrist pronation      Wrist supination      (Blank rows = not tested)  UPPER EXTREMITY MMT:     MMT Right eval Right 02/13/23 Left eval Left 02/13/23  Shoulder flexion 3-/5 3/5 3-/5 3/5  Shoulder abduction 3-/5 3/5 3-/5 3/5  Shoulder adduction      Shoulder extension      Shoulder internal rotation      Shoulder external rotation      Middle trapezius      Lower trapezius      Elbow flexion 4/5 4/5 4/5 4/5  Elbow extension 4-/5 4/5 4-/5 4/5  Wrist flexion 4-/5 4/5 4-/5 4/5  Wrist extension 4-/5 4/5 4-/5 4/5  Wrist ulnar deviation      Wrist radial deviation      Wrist pronation      Wrist supination      (Blank rows =  not tested)  HAND FUNCTION: Grip strength: Right: 38 lbs; Left: 27 lbs, Lateral pinch: Right: 13 lbs, Left: 14 lbs, and 3 point pinch: Right: 16 lbs, Left: 13 lbs  02/13/2023:  Grip strength: Right: 30 lbs; Left: 27 lbs, Lateral pinch: Right: 16 lbs, Left: 14 lbs, and 3 point pinch: Right: 18 lbs, Left: 14 lbs   COORDINATION: 9 Hole Peg test: Right: 55 sec; Left: 47 sec  02/13/2023:  9 Hole Peg test: Right: 1 min.; Left: 1 min. & 6 sec  SENSATION: Light Touch: TBD Proprioception: TBD  EDEMA: N/A  MUSCLE TONE: Ataxia  COGNITION: Overall cognitive status: Within functional limits for tasks assessed   Caregiver reports becomes agitated more easily    VISION ASSESSMENT:   Glasses  PERCEPTION: Intact  PRAXIS: Impaired: Motor planning   TODAY'S TREATMENT:  DATE: 02/15/2023    Therapeutic Ex.:  Pt. worked on the [employer] for 8 min. with constant monitoring of the BUEs. Pt. worked on changing, and alternating forward reverse position every 2 min. Pt. performed 3# dowel ex. for UE strengthening secondary to weakness. Bilateral shoulder flexion, chest press, circular patterns, and elbow flexion/extension were performed. 2# dumbbell ex. for elbow flexion and extension, forearm supination/pronation, wrist flexion/extension, and radial deviation. Pt. requires rest breaks and verbal cues for proper technique.  Neuromuscular re-education:  Pt. worked on [facility] skills grasping, and sorting extra small buttons. Pt. worked on grasping, and storing the extra small buttons. Pt. Required cues, and presented with difficulty storing the small objects in his hand, and performing translatory movements with the hand.         PATIENT EDUCATION: Education details:  UE strengthening, [facility] Person educated: Patient and Child Psychotherapist: Explanation,  Demonstration, and Verbal cues Education comprehension: verbalized understanding, returned demonstration, verbal cues required, and needs further education  HOME EXERCISE PROGRAM:   To continue to assess, and provide as needed   GOALS: Goals reviewed with patient? Yes  SHORT TERM GOALS: Target date: 02/13/2023    Pt. Will require supervision with HEPs for BE UEs. Baseline: 02/13/23:  Eval: No current HEP Goal status: INITIAL  LONG TERM GOALS: Target date: 03/27/2023    Pt. Will assume upright sitting with minimal cues for the duration of a 15 min. Task.  Baseline: 02/13/2023: Pt. Is improving with upright midline sitting, and presents with less left sided leaning. Eval: Pt. with positive left sided leaning. Goal status:  Progressing/Ongoing   2.  Pt. Will increase bilateral shoulder flexion ROM by 10 degrees to assist with bathing/washing his hair Baseline: 02/13/2023: shoulder flexion: Right: 95(110) Left: 94(125) Eval: shoulder flexion: Right: 74(100) Left: 19(877)  Goal status: Progressing/Ongoing  3.  Pt. Will improve bilateral shoulder abduction ROM by 10 degrees to assist with UE dressing skills. Baseline: 02/13/2023: shoulder abduction: Right: 84(95), Left: 90(110) Eval: shoulder abduction: Right: 73(94), Left: 70(98) Goal status: INITIAL  4.  Pt. Will improve left grip strength by 5# to be able to hold items securely Baseline: 02/13/2023: Right: 30# Left: 27# Eval: Right: 38# Left: 27# Goal status:  Ongoing  5.  Pt. Will improve [facility] skills by 10 sec to be able to independently, and efficiently manipulate small items. Baseline: 02/13/23: Left 1 min. & 6 sec. Eval: Left 55 sec Goal status Ongoing  6.  Pt. will increase FOTO score by 2 points to reflect Pt. perceived improvement with assessment specific ADL/IADL's.  Baseline: 02/13/23: 57 Eval: 73 Goal status: Ongoing  ASSESSMENT:  CLINICAL IMPRESSION:  Pt. reports having had an appointment this morning, and adjusted the  OT schedule for this afternoon. Pt. tolerated the UE exercises well, and required cues for proper form, technique, and pace. Pt. presents with difficulty performing translatory movements with the hand.  Pt. continues to benefit from OT services for ADL training, A/E training, UE there. Ex., neuromuscular re-education, and pt./caregiver education.  PERFORMANCE DEFICITS: in functional skills including ADLs, IADLs, coordination, dexterity, ROM, strength, Fine motor control, Gross motor control, balance, and UE functional use, cognitive skills including problem solving and safety awareness, and psychosocial skills including coping strategies, environmental adaptation, interpersonal interactions, and routines and behaviors.   IMPAIRMENTS: are limiting patient from ADLs, IADLs, work, play, leisure, and social participation.   CO-MORBIDITIES: has co-morbidities such as Pervasive Developmental Delay Disorder  that affects occupational performance. Patient will benefit from  skilled OT to address above impairments and improve overall function.  MODIFICATION OR ASSISTANCE TO COMPLETE EVALUATION: Min-Moderate modification of tasks or assist with assess necessary to complete an evaluation.  OT OCCUPATIONAL PROFILE AND HISTORY: Detailed assessment: Review of records and additional review of physical, cognitive, psychosocial history related to current functional performance.  CLINICAL DECISION MAKING: Moderate - several treatment options, min-mod task modification necessary  REHAB POTENTIAL: Good  EVALUATION COMPLEXITY: Moderate    PLAN:  OT FREQUENCY: 2x/week  OT DURATION: 12 weeks  PLANNED INTERVENTIONS: 97535 self care/ADL training, 02889 therapeutic exercise, 97530 therapeutic activity, 97112 neuromuscular re-education, 97140 manual therapy, 97010 moist heat, 97034 contrast bath, balance training, patient/family education, and DME and/or AE instructions  RECOMMENDED OTHER SERVICES: PT &  ST  CONSULTED AND AGREED WITH PLAN OF CARE: Patient and family member/caregiver  PLAN FOR NEXT SESSION: see above  Richardson Otter, MS, OTR/L   02/15/2023, 1:41 PM

## 2023-02-19 ENCOUNTER — Ambulatory Visit: Payer: Medicare Other | Admitting: Occupational Therapy

## 2023-02-19 DIAGNOSIS — M6281 Muscle weakness (generalized): Secondary | ICD-10-CM

## 2023-02-19 DIAGNOSIS — R278 Other lack of coordination: Secondary | ICD-10-CM

## 2023-02-19 NOTE — Therapy (Signed)
 Occupational Therapy Treatment Note   [name] MRN: [id number] DOB:December 28, 1960, 63 y.o., male Today's Date: 02/19/2023  PCP: Idella Guido PARAS, MD REFERRING PROVIDER: Zoraida Cadet, MD  END OF SESSION:  OT End of Session - 02/19/23 1207     Visit Number 12    Number of Visits 24    Date for OT Re-Evaluation 03/27/23    OT Start Time 1151    OT Stop Time 1230    OT Time Calculation (min) 39 min    Activity Tolerance Patient tolerated treatment well    Behavior During Therapy [facility] for tasks assessed/performed             Past Medical History:  Diagnosis Date   Depression    Hypertension    MGUS (monoclonal gammopathy of unknown significance)    Pervasive developmental disorder    Seizures (HCC)    SSS (sick sinus syndrome) (HCC)    PPM placed   Past Surgical History:  Procedure Laterality Date   PACEMAKER IMPLANT     Patient Active Problem List   Diagnosis Date Noted   Vertebral artery stenosis 12/18/2022   Left-sided weakness 12/17/2022   MGUS (monoclonal gammopathy of unknown significance) 07/07/2021   Multifocal pneumonia 12/20/2018   Seizure disorder (HCC) 12/20/2018   COVID-19 virus infection 12/20/2018   Acute respiratory failure with hypoxia (HCC) 12/20/2018   Essential hypertension 12/20/2018   Hyponatremia 12/20/2018   Pancytopenia (HCC) 12/20/2018   Developmental delay disorder 12/20/2018   Pneumonia due to COVID-19 virus 12/20/2018   CKD (chronic kidney disease) stage 3, GFR 30-59 ml/min (HCC) 12/20/2018   Age-related nuclear cataract of both eyes 07/18/2017   Early dry stage nonexudative age-related macular degeneration of both eyes 07/18/2017   Myopia with astigmatism and presbyopia, bilateral 07/18/2017   Onychomycosis 06/29/2016   Risk for falls 02/10/2015   Pacemaker 11/06/2014   Septic shock (HCC) 11/06/2014   Dysarthria 10/31/2014   Altered mental status 10/30/2014   Thrombocytopenia (HCC) 10/08/2014   Medication  monitoring encounter 04/24/2014   Tremor 04/24/2014   Unsteadiness on feet 04/24/2014   History of nonmelanoma skin cancer 07/22/2013   Colon polyps 07/09/2013   Enuresis, primary, functional 05/09/2013   Gait disturbance 04/17/2013   Hemorrhoid 04/17/2013   Intention tremor 04/17/2013   Actinic keratosis 05/27/2010   Major depressive disorder, single episode, severe, with psychotic behavior (HCC) 03/21/2010   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   Pervasive developmental disorder 03/21/2010   Obsessive-compulsive disorder 03/07/2010   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 Reports that he won't be going to [facility] due to the weather.   Pt accompanied by:  Self  PERTINENT HISTORY:   Pt. is a 63 y.o. male who was admitted to the hospital with Left sided weakness, and left sided weakness, vertebral artery stenosis, sick sinus syndrome, Essential tremor, Pervasive Developmental Delay Disorder, Pacemaker implant s/p 6-7 years per caregiver. Pt. Resides at [facility], and attends [facility].   PRECAUTIONS: ICD/Pacemaker 6-7 years, Fall, Pureed diet, with sipping cup  WEIGHT BEARING RESTRICTIONS: No  PAIN:  Are you having pain? No  FALLS: Has patient fallen in last 6 months? Yes. Number of falls 2  LIVING ENVIRONMENT: Lives with:  [facility] Lives in: No steps to enter Has following equipment at home: Vannie - 2 wheeled and shower chair  PLOF: Independent with basic ADLs  PATIENT GOALS:    To be able to walk like everyone else  OBJECTIVE:  Note: Objective measures were completed at Evaluation unless otherwise noted.  HAND DOMINANCE:  Left  ADLs:  Transfers/ambulation related to ADLs: Eating: Independent uses a sipping cup, pureed diet Grooming: Independent with brushing teeth, caregiver assist with shaving UB Dressing: MinA LB Dressing: Supervision Toileting: Caregiver assist Bathing: Caregiver assist Tub Shower transfers: MinA Equipment: none  IADLs: Shopping: N/A Light housekeeping:  Caregiver Assist now Meal Prep: Pureed/ Group Home prepares all meals Community mobility:  Relies on transportation services Medication management: Caregivers provide medication management Financial management: Caregivers Assist Handwriting: 50% legible  MOBILITY STATUS: Needs Assist: RW and Hx of falls  POSTURE COMMENTS:  Leans to the left in supported  sitting   ACTIVITY TOLERANCE: Activity tolerance: Limited  FUNCTIONAL OUTCOME MEASURES: FOTO: 73  UPPER EXTREMITY ROM:    Active ROM Right eval Right 02/13/23 Left eval Right 02/13/23  Shoulder flexion 74(100) 95(110) 80(122) 94(125)  Shoulder abduction 73(94) 84(98) 70(98) 90(110)  Shoulder adduction      Shoulder extension      Shoulder internal rotation      Shoulder external rotation      Elbow flexion [facility] WFL  Elbow extension [facility] WFL  Wrist flexion [facility] WFL  Wrist extension [facility] WFL  Wrist ulnar deviation      Wrist radial deviation      Wrist pronation      Wrist supination      (Blank rows = not tested)  UPPER EXTREMITY MMT:     MMT Right eval Right 02/13/23 Left eval Left 02/13/23  Shoulder flexion 3-/5 3/5 3-/5 3/5  Shoulder abduction 3-/5 3/5 3-/5 3/5  Shoulder adduction      Shoulder extension      Shoulder internal rotation      Shoulder external rotation      Middle trapezius      Lower trapezius      Elbow flexion 4/5 4/5 4/5 4/5  Elbow extension 4-/5 4/5 4-/5 4/5  Wrist flexion 4-/5 4/5 4-/5 4/5  Wrist extension 4-/5 4/5 4-/5 4/5  Wrist ulnar deviation      Wrist radial deviation      Wrist  pronation      Wrist supination      (Blank rows = not tested)  HAND FUNCTION: Grip strength: Right: 38 lbs; Left: 27 lbs, Lateral pinch: Right: 13 lbs, Left: 14 lbs, and 3 point pinch: Right: 16 lbs, Left: 13 lbs  02/13/2023:  Grip strength: Right: 30 lbs; Left: 27 lbs, Lateral pinch: Right: 16 lbs, Left: 14 lbs, and 3 point pinch: Right: 18 lbs, Left: 14 lbs   COORDINATION: 9 Hole Peg test: Right: 55 sec; Left: 47 sec  02/13/2023:  9 Hole Peg test: Right: 1 min.; Left: 1 min. & 6 sec  SENSATION: Light Touch: TBD Proprioception: TBD  EDEMA: N/A  MUSCLE TONE: Ataxia  COGNITION: Overall cognitive status: Within functional limits for tasks assessed   Caregiver reports becomes agitated more easily    VISION ASSESSMENT:   Glasses  PERCEPTION: Intact  PRAXIS: Impaired: Motor planning   TODAY'S TREATMENT:  DATE: 02/19/2023    Therapeutic Ex.:  Pt. worked on the [employer] for 8 min. with constant monitoring of the BUEs. Pt. worked on forward motion for 6 min., and reverse motion for 2 min.  Neuromuscular re-education:  Pt. worked on [facility] skills grasping 1 sticks, 1/4 collars, and 1/4 washers. Pt. worked on storing the objects in the palm, and translatory skills moving the items from the palm of the hand to the tip of the 2nd digit, and thumb. Pt. Worked on grasping, and storing the pegs to remove the from the board.           PATIENT EDUCATION: Education details:  UE strengthening, [facility] Person educated: Patient and Child Psychotherapist: Explanation, Demonstration, and Verbal cues Education comprehension: verbalized understanding, returned demonstration, verbal cues required, and needs further education  HOME EXERCISE PROGRAM:   To continue to assess, and provide as needed   GOALS: Goals reviewed with patient?  Yes  SHORT TERM GOALS: Target date: 02/13/2023    Pt. Will require supervision with HEPs for BE UEs. Baseline: 02/13/23:  Eval: No current HEP Goal status: INITIAL  LONG TERM GOALS: Target date: 03/27/2023    Pt. Will assume upright sitting with minimal cues for the duration of a 15 min. Task.  Baseline: 02/13/2023: Pt. Is improving with upright midline sitting, and presents with less left sided leaning. Eval: Pt. with positive left sided leaning. Goal status:  Progressing/Ongoing   2.  Pt. Will increase bilateral shoulder flexion ROM by 10 degrees to assist with bathing/washing his hair Baseline: 02/13/2023: shoulder flexion: Right: 95(110) Left: 94(125) Eval: shoulder flexion: Right: 74(100) Left: 19(877)  Goal status: Progressing/Ongoing  3.  Pt. Will improve bilateral shoulder abduction ROM by 10 degrees to assist with UE dressing skills. Baseline: 02/13/2023: shoulder abduction: Right: 84(95), Left: 90(110) Eval: shoulder abduction: Right: 73(94), Left: 70(98) Goal status: INITIAL  4.  Pt. Will improve left grip strength by 5# to be able to hold items securely Baseline: 02/13/2023: Right: 30# Left: 27# Eval: Right: 38# Left: 27# Goal status:  Ongoing  5.  Pt. Will improve [facility] skills by 10 sec to be able to independently, and efficiently manipulate small items. Baseline: 02/13/23: Left 1 min. & 6 sec. Eval: Left 55 sec Goal status Ongoing  6.  Pt. will increase FOTO score by 2 points to reflect Pt. perceived improvement with assessment specific ADL/IADL's.  Baseline: 02/13/23: 57 Eval: 73 Goal status: Ongoing  ASSESSMENT:  CLINICAL IMPRESSION:  Pt. tolerated the UE exercise on the [employer] without difficulty on level 2. Pt.  Did require cues for proper form, technique, and pace. Pt. presents with more difficulty manipulating the small circular collars. Pt. was able to grasp 1/2 of the collars from the horizontal position in the dish, however was able to grasp the remaining collars  when they were positioned vertically on the top.  Pt. Was able to grasp and store 5-1 stick when removing them from the vertical position. Pt. continues to benefit from OT services for ADL training, A/E training, UE there. Ex., neuromuscular re-education, and pt./caregiver education.  PERFORMANCE DEFICITS: in functional skills including ADLs, IADLs, coordination, dexterity, ROM, strength, Fine motor control, Gross motor control, balance, and UE functional use, cognitive skills including problem solving and safety awareness, and psychosocial skills including coping strategies, environmental adaptation, interpersonal interactions, and routines and behaviors.   IMPAIRMENTS: are limiting patient from ADLs, IADLs, work, play, leisure, and social participation.   CO-MORBIDITIES: has co-morbidities such as Pervasive  Developmental Delay Disorder  that affects occupational performance. Patient will benefit from skilled OT to address above impairments and improve overall function.  MODIFICATION OR ASSISTANCE TO COMPLETE EVALUATION: Min-Moderate modification of tasks or assist with assess necessary to complete an evaluation.  OT OCCUPATIONAL PROFILE AND HISTORY: Detailed assessment: Review of records and additional review of physical, cognitive, psychosocial history related to current functional performance.  CLINICAL DECISION MAKING: Moderate - several treatment options, min-mod task modification necessary  REHAB POTENTIAL: Good  EVALUATION COMPLEXITY: Moderate    PLAN:  OT FREQUENCY: 2x/week  OT DURATION: 12 weeks  PLANNED INTERVENTIONS: 97535 self care/ADL training, 02889 therapeutic exercise, 97530 therapeutic activity, 97112 neuromuscular re-education, 97140 manual therapy, 97010 moist heat, 97034 contrast bath, balance training, patient/family education, and DME and/or AE instructions  RECOMMENDED OTHER SERVICES: PT & ST  CONSULTED AND AGREED WITH PLAN OF CARE: Patient and family  member/caregiver  PLAN FOR NEXT SESSION: see above  Richardson Otter, MS, OTR/L   02/19/2023, 12:10 PM

## 2023-02-21 ENCOUNTER — Ambulatory Visit: Payer: Medicare Other | Admitting: Occupational Therapy

## 2023-02-21 ENCOUNTER — Ambulatory Visit: Payer: Medicare Other

## 2023-02-21 DIAGNOSIS — M6281 Muscle weakness (generalized): Secondary | ICD-10-CM | POA: Diagnosis not present

## 2023-02-21 DIAGNOSIS — R262 Difficulty in walking, not elsewhere classified: Secondary | ICD-10-CM

## 2023-02-21 DIAGNOSIS — R2681 Unsteadiness on feet: Secondary | ICD-10-CM

## 2023-02-21 DIAGNOSIS — R278 Other lack of coordination: Secondary | ICD-10-CM

## 2023-02-21 NOTE — Therapy (Signed)
 OUTPATIENT PHYSICAL THERAPY NEURO TREATMENT   [name] MRN: [id number] DOB:[birth year], 63 y.o., male Today's Date: 02/21/2023   PCP: Paz Bott. Bertis Brochure, MD REFERRING PROVIDER: Pinky Bright, MD  END OF SESSION:  PT End of Session - 02/21/23 1224     Visit Number 8    Number of Visits 24    Date for PT Re-Evaluation 03/27/23    PT Start Time 1017    PT Stop Time 1058    PT Time Calculation (min) 41 min    Equipment Utilized During Treatment Gait belt    Activity Tolerance Patient tolerated treatment well    Behavior During Therapy WFL for tasks assessed/performed               Past Medical History:  Diagnosis Date   Depression    Hypertension    MGUS (monoclonal gammopathy of unknown significance)    Pervasive developmental disorder    Seizures (HCC)    SSS (sick sinus syndrome) (HCC)    PPM placed   Past Surgical History:  Procedure Laterality Date   PACEMAKER IMPLANT     Patient Active Problem List   Diagnosis Date Noted   Vertebral artery stenosis 12/18/2022   Left-sided weakness 12/17/2022   MGUS (monoclonal gammopathy of unknown significance) 07/07/2021   Multifocal pneumonia 12/20/2018   Seizure disorder (HCC) 12/20/2018   COVID-19 virus infection 12/20/2018   Acute respiratory failure with hypoxia (HCC) 12/20/2018   Essential hypertension 12/20/2018   Hyponatremia 12/20/2018   Pancytopenia (HCC) 12/20/2018   Developmental delay disorder 12/20/2018   Pneumonia due to COVID-19 virus 12/20/2018   CKD (chronic kidney disease) stage 3, GFR 30-59 ml/min (HCC) 12/20/2018   Age-related nuclear cataract of both eyes 07/18/2017   Early dry stage nonexudative age-related macular degeneration of both eyes 07/18/2017   Myopia with astigmatism and presbyopia, bilateral 07/18/2017   Onychomycosis 06/29/2016   Risk for falls 02/10/2015   Pacemaker 11/06/2014   Septic shock (HCC) 11/06/2014   Dysarthria 10/31/2014   Altered mental status  10/30/2014   Thrombocytopenia (HCC) 10/08/2014   Medication monitoring encounter 04/24/2014   Tremor 04/24/2014   Unsteadiness on feet 04/24/2014   History of nonmelanoma skin cancer 07/22/2013   Colon polyps 07/09/2013   Enuresis, primary, functional 05/09/2013   Gait disturbance 04/17/2013   Hemorrhoid 04/17/2013   Intention tremor 04/17/2013   Actinic keratosis 05/27/2010   Major depressive disorder, single episode, severe, with psychotic behavior (HCC) 03/21/2010   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   Pervasive developmental disorder 03/21/2010   Obsessive-compulsive disorder 03/07/2010   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   Obesity due to excess calories 02/25/2010    ONSET DATE: November 2024  REFERRING DIAG:  Diagnosis  G45.9 (ICD-10-CM) - TIA (transient ischemic attack)    THERAPY DIAG:  Muscle weakness (generalized)  Difficulty in walking, not elsewhere classified  Unsteadiness on feet  Rationale for Evaluation and Treatment: Rehabilitation  SUBJECTIVE:  SUBJECTIVE STATEMENT: Pt states things have been good. Pt with caregiver, pt and caregiver reports no other falls. Pt  reports bruise on R dorsal hand, he thinks he bumped into something.  Pt accompanied by:  caregiver  PERTINENT HISTORY:  From eval  Pt is a 64 year old male presenting to physical therapy for issues concerning balance and strength. At baseline, pt has a history of intellectual disabilities and lives in a group home. Pt is accompanied by his caretaker where they report he had a "slight stroke" early November where he spent a couple of days at [facility] on the third floor. According to ED notes from his stay (11/9-11/11), pt was brought in after a syncopal episode where they found no evidence of stroke on the CT,  but significant vertebral artery stenosis. Prior to this recent admission, he was living in a group home and independent with most ADL's including getting himself dressed and bathing. Since then, he now requires assistance with basic ADL's and uses a two wheeled rolling walker for ambulation. He reports his left side is now "weaker" since his hospital stay. In the last six months, he has had two falls with both taking place in the bathroom. His caretaker reports one fall was when he was attempting to get out of the shower and hit his head on the sink. He has had two hospital admissions over the last year both related to falls. His typical day consists of going to school at the group home and watching television. He enjoys looking at antique shops in his free time and enjoys home improvement shows.   PMH: sick sinus syndrome, vertebral artery stenosis, pacemaker implant, I/DD, hypertension, and seizure disorder. Pt is referred to OPPT after recent increase in falls, syncope, concern for TIA. According to ED notes from his stay (11/9-11/11), pt was brought in after a syncopal episode where they found no evidence of stroke on the CT, but significant vertebral artery stenosis. While in the ED, pacemaker interrogated, ruled out. At baseline pt was living in a group home and independent with most ADL including getting himself dressed and bathing. Since then, he now requires assistance with basic ADL and uses a two wheeled rolling walker for ambulation. Several ED visits since October due to falls. Pt enjoys looking at antique shops in his free time and watching home improvement shows.    PAIN:  Are you having pain? No and pt reports achy feeling in the morning   PRECAUTIONS: Fall  WEIGHT BEARING RESTRICTIONS: No  FALLS: Has patient fallen in last 6 months? Yes. Number of falls 2 Pt's caretaker reports one fall when trying to get out of the shower where he hit his head on the sink.   LIVING  ENVIRONMENT: Lives with: lives in an adult home Lives in: Group home Stairs: No  Has following equipment at home: Otho Blitz - 2 wheeled and shower chair rolling wl and shower chair   PLOF: Needs assistance with ADLs   PATIENT GOALS: work on stairs, walk like he used to  OBJECTIVE:  Note: Objective measures were completed at Evaluation unless otherwise noted.  DIAGNOSTIC FINDINGS:   MRI HEAD WITHOUT CONTRAST- 12/18/2022  COMPARISON:  Head CT December 16, 2022.   FINDINGS: Brain: Scattered foci no acute infarction, hemorrhage, hydrocephalus, extra-axial collection or mass lesion. Scattered foci T2 hyperintensity are seen within the white matter of the cerebral hemispheres, nonspecific. Mild parenchymal volume loss.   IMPRESSION: 1. No acute intracranial abnormality. 2. Scattered foci of  T2 hyperintensity within the white matter of the cerebral hemispheres, nonspecific but may represent mild chronic microvascular ischemic changes. 3. Mild parenchymal volume loss.    VITALS BP: 107/67 mmHg in seated HR: 70 bpm   COGNITION: Overall cognitive status: History of cognitive impairments - at baseline   SENSATION: Not tested  POSTURE: forward head and increased thoracic kyphosis   LOWER EXTREMITY MMT:    MMT Right Eval Left Eval  Hip flexion 4 4-  Hip extension    Hip abduction 4-** 4-**  Hip adduction 4* 4*  Hip internal rotation    Hip external rotation    Knee flexion 3+ 3  Knee extension 3+ 3  Ankle dorsiflexion 4 3+  Ankle plantarflexion    Ankle inversion    Ankle eversion    (Blank rows = not tested) **Hip abduction and adduction tested in seated  TRANSFERS: Assistive device utilized: None  Sit to stand: Complete Independence Stand to sit: Complete Independence Chair to chair: Complete Independence   STAIRS: Assess at next visit   GAIT: Gait pattern: two point step to with rolling walker, decreased stride length, Right foot flat, Left foot flat,  trunk flexed, narrow BOS, poor foot clearance- Right, and poor foot clearance- Left Distance walked: >500 ft Assistive device utilized: Environmental consultant - 2 wheeled Level of assistance: SBA for safety  Comments: forward flexed trunk, increased adduction of LE, path deviation to the left, when completing turns pt lifts walker off the ground, narrow BOS, left lateral lean , genuvarus of right LE dynamic in mid stance , trendelenburg on right hip for ligamentous lateral stability in midstance, gait deviations dramatically more deviated after 4 minutes of ambulation resultant cross-over and catching of toes on contralateral heel in swing phase bilaterally   FUNCTIONAL TESTS:  5 times sit to stand: 29.3 with UE on thigh   Timed up and go (TUG): 27.96 with two wheeled walker, 34.21 without AD 6 minute walk test: 520 ft with rolling walker, increased fatigue  Berg Balance Scale:.36/56  PATIENT SURVEYS:  FOTO 58 - completed with assistance from SPT and caretaker   TODAY'S TREATMENT:                                                                                                                              DATE: 02/21/23  Gait belt donned throughout for pt safety   TE: STS: 1x8, 1x6, 1x4 with use of BUE  4# ankle weights donned each LE: Gait for strength/endurance x 444 ft with RW - cuing for proximity to RW  - reports easy FWD step outs in a seated position - difficulty with technique, completes multiple reps/partial reps LTL step outs in seated 10x each way   STS 5x to RW  Standing march at RW 2x10 each LE Standing hip ext multiple attempts each LE, difficulty completing even with frequent cues   Retro-gait for hip ext 3x20 ft with close CGA  Nustep strength/endurance  training  Lvl 1 x 3 min Lvl 2 x 3 min Cuing throughout for attention to task, speed, occ hands-on assist with reciprocal movement, SPM 50s-60s   Mini squats with UE support 2x10  Standing hip abduction 2x10 each LE  PATIENT  EDUCATION: Education details: exercise technique Person educated: Patient and Caregiver   Education method: Explanation Education comprehension: verbalized understanding and needs further education  HOME EXERCISE PROGRAM: Access Code: [email or web address] URL: [email or web address] Date: 01/08/2023 Prepared by: Aurora Lees  Exercises - Standing March with Counter Support  - 1 x daily - 7 x weekly - 3 sets - 10 reps - 2 hold - Standing Hip Abduction with Counter Support  - 1 x daily - 7 x weekly - 3 sets - 10 reps - 3 hold - Sit to Stand with Arms Crossed  - 1 x daily - 7 x weekly - 3 sets - 10 reps  GOALS: Goals reviewed with patient? Yes  SHORT TERM GOALS: Target date: 02/13/2023  Patient will be independent in home exercise program to improve strength/mobility for better functional independence with ADLs. Baseline: To be provided next visit  Goal status: INITIAL  2.  N/A Baseline:  Goal status: INITIAL    LONG TERM GOALS: Target date: 03/27/23  Patient will increase FOTO score to equal to or greater than   71  to demonstrate statistically significant improvement in mobility and quality of life.  Baseline: 58 Goal status: INITIAL  2.  Patient (> 91 years old) will complete five times sit to stand test in < 15 seconds indicating an increased LE strength and improved balance. Baseline: 29.33 Goal status: INITIAL  3.  Patient will increase six minute walk test distance to >1000 for progression to community ambulator and improve gait ability Baseline: 520 ft with walker Goal status: INITIAL  4.  Patient will reduce timed up and go to <15 seconds to reduce fall risk and demonstrate improved transfer/gait ability. Baseline: 27.96 with walker, 34.21 without AD Goal status: INITIAL  5.  Patient will increase Berg Balance score by > 6 points to demonstrate decreased fall risk during functional activities. Baseline: 36 Goal status: INITIAL  6.  Patient will  ascend/descend 5 stairs with least restrictive assistive device without loss of balance to improve functional mobility.   Baseline: Assess next visit  Goal status: INITIAL  ASSESSMENT:  CLINICAL IMPRESSION: Pt with excellent motivation to participate in session, but does require frequent cuing at times to focus on activity/becomes distracted easily. Pt able to progress to use of heavier ankle weights for gait activity, and reported this was still easy. The pt will continue to benefit from skilled physical therapy decrease risk of falls, improve quality of life, and maximize function.     OBJECTIVE IMPAIRMENTS: Abnormal gait, decreased activity tolerance, decreased balance, decreased cognition, decreased coordination, decreased endurance, decreased knowledge of use of DME, decreased mobility, difficulty walking, decreased strength, decreased safety awareness, and improper body mechanics.   ACTIVITY LIMITATIONS: carrying, lifting, bending, standing, squatting, stairs, bathing, toileting, and dressing  PARTICIPATION LIMITATIONS: meal prep, cleaning, laundry, medication management, personal finances, driving, shopping, community activity, occupation, and yard work  PERSONAL FACTORS: Behavior pattern and 3+ comorbidities: sick sinus syndrome, vertebral artery stenosis, pacemaker implant, developmental delay disorder, hypertension, and seizure disorder.  are also affecting patient's functional outcome.  REHAB POTENTIAL: Good  CLINICAL DECISION MAKING: Evolving/moderate complexity  EVALUATION COMPLEXITY: Moderate  PLAN:  PT FREQUENCY: 1-2x/week  PT DURATION: 12 weeks  PLANNED INTERVENTIONS: 97110-Therapeutic exercises, 97530-  Therapeutic activity, [id number]- Neuromuscular re-education, [phone]- Self Care, [id number]- Manual therapy, [phone]- Gait training, [phone]- Orthotic Fit/training, [phone]- Electrical stimulation (unattended), Patient/Family education, Balance training, Stair training, Joint  mobilization, Cryotherapy, and Moist heat  PLAN FOR NEXT SESSION:   develop HEP, LE strengthening dynamic balance and gait without AD     Samie Crews, PT 02/21/2023, 12:27 PM   Physical Therapist - [facility]  12:27 PM 02/21/23

## 2023-02-21 NOTE — Therapy (Signed)
 Occupational Therapy Treatment Note   [name] MRN: [id number] DOB:May 27, 1960, 63 y.o., male Today's Date: 02/21/2023  PCP: Sammye Cristal, MD REFERRING PROVIDER: Sarina Curb, MD  END OF SESSION:  OT End of Session - 02/21/23 1122     Visit Number 13    Number of Visits 24    Date for OT Re-Evaluation 03/27/23    OT Start Time 0934    OT Stop Time 1015    OT Time Calculation (min) 41 min    Activity Tolerance Patient tolerated treatment well    Behavior During Therapy [facility] for tasks assessed/performed             Past Medical History:  Diagnosis Date   Depression    Hypertension    MGUS (monoclonal gammopathy of unknown significance)    Pervasive developmental disorder    Seizures (HCC)    SSS (sick sinus syndrome) (HCC)    PPM placed   Past Surgical History:  Procedure Laterality Date   PACEMAKER IMPLANT     Patient Active Problem List   Diagnosis Date Noted   Vertebral artery stenosis 12/18/2022   Left-sided weakness 12/17/2022   MGUS (monoclonal gammopathy of unknown significance) 07/07/2021   Multifocal pneumonia 12/20/2018   Seizure disorder (HCC) 12/20/2018   COVID-19 virus infection 12/20/2018   Acute respiratory failure with hypoxia (HCC) 12/20/2018   Essential hypertension 12/20/2018   Hyponatremia 12/20/2018   Pancytopenia (HCC) 12/20/2018   Developmental delay disorder 12/20/2018   Pneumonia due to COVID-19 virus 12/20/2018   CKD (chronic kidney disease) stage 3, GFR 30-59 ml/min (HCC) 12/20/2018   Age-related nuclear cataract of both eyes 07/18/2017   Early dry stage nonexudative age-related macular degeneration of both eyes 07/18/2017   Myopia with astigmatism and presbyopia, bilateral 07/18/2017   Onychomycosis 06/29/2016   Risk for falls 02/10/2015   Pacemaker 11/06/2014   Septic shock (HCC) 11/06/2014   Dysarthria 10/31/2014   Altered mental status 10/30/2014   Thrombocytopenia (HCC) 10/08/2014   Medication  monitoring encounter 04/24/2014   Tremor 04/24/2014   Unsteadiness on feet 04/24/2014   History of nonmelanoma skin cancer 07/22/2013   Colon polyps 07/09/2013   Enuresis, primary, functional 05/09/2013   Gait disturbance 04/17/2013   Hemorrhoid 04/17/2013   Intention tremor 04/17/2013   Actinic keratosis 05/27/2010   Major depressive disorder, single episode, severe, with psychotic behavior (HCC) 03/21/2010   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   Pervasive developmental disorder 03/21/2010   Obsessive-compulsive disorder 03/07/2010   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   Obesity due to excess calories 02/25/2010    ONSET DATE: 12/22/23  REFERRING DIAG: Left sided weakness  THERAPY DIAG:  Muscle weakness (generalized)  Other lack of coordination  Rationale for Evaluation and Treatment: Rehabilitation  SUBJECTIVE:   SUBJECTIVE STATEMENT:  Pt. Reports  doing well today  Pt accompanied by:  Self  PERTINENT HISTORY:   Pt. is a 63 y.o. male who was admitted to the hospital with Left sided weakness, and left sided weakness, vertebral artery stenosis, sick sinus syndrome, Essential tremor, Pervasive Developmental Delay Disorder, Pacemaker implant s/p 6-7 years per caregiver. Pt. Resides at [facility], and attends [facility].   PRECAUTIONS: ICD/Pacemaker 6-7 years, Fall, Pureed diet, with sipping cup  WEIGHT BEARING RESTRICTIONS: No  PAIN:  Are you having pain? No  FALLS: Has patient fallen in last 6 months? Yes. Number of falls 2  LIVING ENVIRONMENT: Lives with: [facility] Lives in: No steps  to enter Has following equipment at home: Otho Blitz - 2 wheeled and shower chair  PLOF: Independent with basic ADLs  PATIENT GOALS:    To be able to walk like everyone else  OBJECTIVE:  Note: Objective measures were completed at Evaluation unless otherwise noted.  HAND DOMINANCE: Left  ADLs:  Transfers/ambulation related to  ADLs: Eating: Independent uses a sipping cup, pureed diet Grooming: Independent with brushing teeth, caregiver assist with shaving UB Dressing: MinA LB Dressing: Supervision Toileting: Caregiver assist Bathing: Caregiver assist Tub Shower transfers: MinA Equipment: none  IADLs: Shopping: N/A Light housekeeping:  Caregiver Assist now Meal Prep: Pureed/ Group Home prepares all meals Community mobility:  Relies on transportation services Medication management: Caregivers provide medication management Financial management: Caregivers Assist Handwriting: 50% legible  MOBILITY STATUS: Needs Assist: RW and Hx of falls  POSTURE COMMENTS:  Leans to the left in supported  sitting   ACTIVITY TOLERANCE: Activity tolerance: Limited  FUNCTIONAL OUTCOME MEASURES: FOTO: 73  UPPER EXTREMITY ROM:    Active ROM Right eval Right 02/13/23 Left eval Right 02/13/23  Shoulder flexion 74(100) 95(110) 80(122) 94(125)  Shoulder abduction 73(94) 84(98) 70(98) 90(110)  Shoulder adduction      Shoulder extension      Shoulder internal rotation      Shoulder external rotation      Elbow flexion [facility] WFL  Elbow extension [facility] WFL  Wrist flexion [facility] WFL  Wrist extension [facility] WFL  Wrist ulnar deviation      Wrist radial deviation      Wrist pronation      Wrist supination      (Blank rows = not tested)  UPPER EXTREMITY MMT:     MMT Right eval Right 02/13/23 Left eval Left 02/13/23  Shoulder flexion 3-/5 3/5 3-/5 3/5  Shoulder abduction 3-/5 3/5 3-/5 3/5  Shoulder adduction      Shoulder extension      Shoulder internal rotation      Shoulder external rotation      Middle trapezius      Lower trapezius      Elbow flexion 4/5 4/5 4/5 4/5  Elbow extension 4-/5 4/5 4-/5 4/5  Wrist flexion 4-/5 4/5 4-/5 4/5  Wrist extension 4-/5 4/5 4-/5 4/5  Wrist ulnar deviation      Wrist radial deviation      Wrist pronation      Wrist supination      (Blank rows =  not tested)  HAND FUNCTION: Grip strength: Right: 38 lbs; Left: 27 lbs, Lateral pinch: Right: 13 lbs, Left: 14 lbs, and 3 point pinch: Right: 16 lbs, Left: 13 lbs  02/13/2023:  Grip strength: Right: 30 lbs; Left: 27 lbs, Lateral pinch: Right: 16 lbs, Left: 14 lbs, and 3 point pinch: Right: 18 lbs, Left: 14 lbs   COORDINATION: 9 Hole Peg test: Right: 55 sec; Left: 47 sec  02/13/2023:  9 Hole Peg test: Right: 1 min.; Left: 1 min. & 6 sec  SENSATION: Light Touch: TBD Proprioception: TBD  EDEMA: N/A  MUSCLE TONE: Ataxia  COGNITION: Overall cognitive status: Within functional limits for tasks assessed   Caregiver reports becomes agitated more easily    VISION ASSESSMENT:   Glasses  PERCEPTION: Intact  PRAXIS: Impaired: Motor planning   TODAY'S TREATMENT:  DATE: 02/21/2023    Therapeutic Ex.:  Pt. worked on the [employer] for 8 min. with constant monitoring of the BUEs. Pt. worked on forward motion for 6 min., and reverse motion for 2 min.  Neuromuscular re-education:  Pt. worked on [facility] skills manipulating nuts, and bolts on a bolt board. Pt. worked on screwing, and unscrewing nuts, and bolts of varying sizes, and challenging progressively smaller items with his vision occluded. Pt. worked on [facility] skills grasping extra small 1" sticks from a horizontal position,a nd moving them to a vertical position. Pt. worked with tweezers initially in his left hand, and worked with grasping them by hand using the right hand.           PATIENT EDUCATION: Education details:  UE strengthening, [facility] Person educated: Patient and Child psychotherapist: Explanation, Demonstration, and Verbal cues Education comprehension: verbalized understanding, returned demonstration, verbal cues required, and needs further education  HOME EXERCISE PROGRAM:   To  continue to assess, and provide as needed   GOALS: Goals reviewed with patient? Yes  SHORT TERM GOALS: Target date: 02/13/2023    Pt. Will require supervision with HEPs for BE UEs. Baseline: 02/13/23:  Eval: No current HEP Goal status: INITIAL  LONG TERM GOALS: Target date: 03/27/2023    Pt. Will assume upright sitting with minimal cues for the duration of a 15 min. Task.  Baseline: 02/13/2023: Pt. Is improving with upright midline sitting, and presents with less left sided leaning. Eval: Pt. with positive left sided leaning. Goal status:  Progressing/Ongoing   2.  Pt. Will increase bilateral shoulder flexion ROM by 10 degrees to assist with bathing/washing his hair Baseline: 02/13/2023: shoulder flexion: Right: 95(110) Left: 94(125) Eval: shoulder flexion: Right: 74(100) Left: 16(109)  Goal status: Progressing/Ongoing  3.  Pt. Will improve bilateral shoulder abduction ROM by 10 degrees to assist with UE dressing skills. Baseline: 02/13/2023: shoulder abduction: Right: 84(95), Left: 90(110) Eval: shoulder abduction: Right: 73(94), Left: 70(98) Goal status: INITIAL  4.  Pt. Will improve left grip strength by 5# to be able to hold items securely Baseline: 02/13/2023: Right: 30# Left: 27# Eval: Right: 38# Left: 27# Goal status:  Ongoing  5.  Pt. Will improve [facility] skills by 10 sec to be able to independently, and efficiently manipulate small items. Baseline: 02/13/23: Left 1 min. & 6 sec. Eval: Left 55 sec Goal status Ongoing  6.  Pt. will increase FOTO score by 2 points to reflect Pt. perceived improvement with assessment specific ADL/IADL's.  Baseline: 02/13/23: 57 Eval: 73 Goal status: Ongoing  ASSESSMENT:  CLINICAL IMPRESSION:  Pt. tolerated the UE exercise on the [employer] without difficulty on level 4. Pt. was able to manipulate, and complete all of the bolts on the bolt board. Pt. Presented with difficulty manipulating tweezers in the hand to turn the 1" sticks in preparation for  placing them vertically into the pegboard. Pt. Requires increased time, and multiple cues to complete the [facility] tasks. Pt. continues to benefit from OT services for ADL training, A/E training, UE there. Ex., neuromuscular re-education, and pt./caregiver education.  PERFORMANCE DEFICITS: in functional skills including ADLs, IADLs, coordination, dexterity, ROM, strength, Fine motor control, Gross motor control, balance, and UE functional use, cognitive skills including problem solving and safety awareness, and psychosocial skills including coping strategies, environmental adaptation, interpersonal interactions, and routines and behaviors.   IMPAIRMENTS: are limiting patient from ADLs, IADLs, work, play, leisure, and social participation.   CO-MORBIDITIES: has co-morbidities such as Pervasive Developmental Delay  Disorder  that affects occupational performance. Patient will benefit from skilled OT to address above impairments and improve overall function.  MODIFICATION OR ASSISTANCE TO COMPLETE EVALUATION: Min-Moderate modification of tasks or assist with assess necessary to complete an evaluation.  OT OCCUPATIONAL PROFILE AND HISTORY: Detailed assessment: Review of records and additional review of physical, cognitive, psychosocial history related to current functional performance.  CLINICAL DECISION MAKING: Moderate - several treatment options, min-mod task modification necessary  REHAB POTENTIAL: Good  EVALUATION COMPLEXITY: Moderate    PLAN:  OT FREQUENCY: 2x/week  OT DURATION: 12 weeks  PLANNED INTERVENTIONS: 97535 self care/ADL training, 57846 therapeutic exercise, 97530 therapeutic activity, 97112 neuromuscular re-education, 97140 manual therapy, 97010 moist heat, 97034 contrast bath, balance training, patient/family education, and DME and/or AE instructions  RECOMMENDED OTHER SERVICES: PT & ST  CONSULTED AND AGREED WITH PLAN OF CARE: Patient and family member/caregiver  PLAN FOR NEXT  SESSION: see above  Duey Ghent, MS, OTR/L   02/21/2023, 11:25 AM

## 2023-02-27 ENCOUNTER — Ambulatory Visit: Payer: Medicare Other | Admitting: Occupational Therapy

## 2023-02-27 ENCOUNTER — Ambulatory Visit: Payer: Medicare Other

## 2023-02-27 DIAGNOSIS — M6281 Muscle weakness (generalized): Secondary | ICD-10-CM | POA: Diagnosis not present

## 2023-02-27 DIAGNOSIS — R2681 Unsteadiness on feet: Secondary | ICD-10-CM

## 2023-02-27 DIAGNOSIS — R278 Other lack of coordination: Secondary | ICD-10-CM

## 2023-02-27 DIAGNOSIS — R262 Difficulty in walking, not elsewhere classified: Secondary | ICD-10-CM

## 2023-02-27 NOTE — Therapy (Signed)
OUTPATIENT PHYSICAL THERAPY NEURO TREATMENT   [name] MRN: [id number] DOB:January 06, 1961, 63 y.o., male Today's Date: 02/27/2023   PCP: Domenic Schwab. Danae Chen, MD REFERRING PROVIDER: Rodney Cruise, MD  END OF SESSION:  PT End of Session - 02/27/23 1019     Visit Number 9    Number of Visits 24    Date for PT Re-Evaluation 03/27/23    PT Start Time 1020    PT Stop Time 1057    PT Time Calculation (min) 37 min    Equipment Utilized During Treatment Gait belt    Activity Tolerance Patient tolerated treatment well    Behavior During Therapy WFL for tasks assessed/performed               Past Medical History:  Diagnosis Date   Depression    Hypertension    MGUS (monoclonal gammopathy of unknown significance)    Pervasive developmental disorder    Seizures (HCC)    SSS (sick sinus syndrome) (HCC)    PPM placed   Past Surgical History:  Procedure Laterality Date   PACEMAKER IMPLANT     Patient Active Problem List   Diagnosis Date Noted   Vertebral artery stenosis 12/18/2022   Left-sided weakness 12/17/2022   MGUS (monoclonal gammopathy of unknown significance) 07/07/2021   Multifocal pneumonia 12/20/2018   Seizure disorder (HCC) 12/20/2018   COVID-19 virus infection 12/20/2018   Acute respiratory failure with hypoxia (HCC) 12/20/2018   Essential hypertension 12/20/2018   Hyponatremia 12/20/2018   Pancytopenia (HCC) 12/20/2018   Developmental delay disorder 12/20/2018   Pneumonia due to COVID-19 virus 12/20/2018   CKD (chronic kidney disease) stage 3, GFR 30-59 ml/min (HCC) 12/20/2018   Age-related nuclear cataract of both eyes 07/18/2017   Early dry stage nonexudative age-related macular degeneration of both eyes 07/18/2017   Myopia with astigmatism and presbyopia, bilateral 07/18/2017   Onychomycosis 06/29/2016   Risk for falls 02/10/2015   Pacemaker 11/06/2014   Septic shock (HCC) 11/06/2014   Dysarthria 10/31/2014   Altered mental status  10/30/2014   Thrombocytopenia (HCC) 10/08/2014   Medication monitoring encounter 04/24/2014   Tremor 04/24/2014   Unsteadiness on feet 04/24/2014   History of nonmelanoma skin cancer 07/22/2013   Colon polyps 07/09/2013   Enuresis, primary, functional 05/09/2013   Gait disturbance 04/17/2013   Hemorrhoid 04/17/2013   Intention tremor 04/17/2013   Actinic keratosis 05/27/2010   Major depressive disorder, single episode, severe, with psychotic behavior (HCC) 03/21/2010   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   Pervasive developmental disorder 03/21/2010   Obsessive-compulsive disorder 03/07/2010   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   Obesity due to excess calories 02/25/2010    ONSET DATE: November 2024  REFERRING DIAG:  Diagnosis  G45.9 (ICD-10-CM) - TIA (transient ischemic attack)    THERAPY DIAG:  Muscle weakness (generalized)  Unsteadiness on feet  Difficulty in walking, not elsewhere classified  Rationale for Evaluation and Treatment: Rehabilitation  SUBJECTIVE:  SUBJECTIVE STATEMENT: Pt reports pain currently, no updates. Pt reports one fall, reports he did not get hurt. He says this occurred in his room, he thinks he was using his RW but is not entirely sure. He grabbed a table and got up on his own.  Pt accompanied by:  self  PERTINENT HISTORY:  From eval  Pt is a 63 year old male presenting to physical therapy for issues concerning balance and strength. At baseline, pt has a history of intellectual disabilities and lives in a group home. Pt is accompanied by his caretaker where they report he had a "slight stroke" early November where he spent a couple of days at [facility] on the third floor. According to ED notes from his stay (11/9-11/11), pt was brought in after a syncopal episode  where they found no evidence of stroke on the CT, but significant vertebral artery stenosis. Prior to this recent admission, he was living in a group home and independent with most ADL's including getting himself dressed and bathing. Since then, he now requires assistance with basic ADL's and uses a two wheeled rolling walker for ambulation. He reports his left side is now "weaker" since his hospital stay. In the last six months, he has had two falls with both taking place in the bathroom. His caretaker reports one fall was when he was attempting to get out of the shower and hit his head on the sink. He has had two hospital admissions over the last year both related to falls. His typical day consists of going to school at the group home and watching television. He enjoys looking at antique shops in his free time and enjoys home improvement shows.   PMH: sick sinus syndrome, vertebral artery stenosis, pacemaker implant, I/DD, hypertension, and seizure disorder. Pt is referred to OPPT after recent increase in falls, syncope, concern for TIA. According to ED notes from his stay (11/9-11/11), pt was brought in after a syncopal episode where they found no evidence of stroke on the CT, but significant vertebral artery stenosis. While in the ED, pacemaker interrogated, ruled out. At baseline pt was living in a group home and independent with most ADL including getting himself dressed and bathing. Since then, he now requires assistance with basic ADL and uses a two wheeled rolling walker for ambulation. Several ED visits since October due to falls. Pt enjoys looking at antique shops in his free time and watching home improvement shows.    PAIN:  Are you having pain? No and pt reports achy feeling in the morning   PRECAUTIONS: Fall  WEIGHT BEARING RESTRICTIONS: No  FALLS: Has patient fallen in last 6 months? Yes. Number of falls 2 Pt's caretaker reports one fall when trying to get out of the shower where he  hit his head on the sink.   LIVING ENVIRONMENT: Lives with: lives in an adult home Lives in: Group home Stairs: No  Has following equipment at home: Dan Humphreys - 2 wheeled and shower chair rolling wl and shower chair   PLOF: Needs assistance with ADLs   PATIENT GOALS: work on stairs, walk like he used to  OBJECTIVE:  Note: Objective measures were completed at Evaluation unless otherwise noted.  DIAGNOSTIC FINDINGS:   MRI HEAD WITHOUT CONTRAST- 12/18/2022  COMPARISON:  Head CT December 16, 2022.   FINDINGS: Brain: Scattered foci no acute infarction, hemorrhage, hydrocephalus, extra-axial collection or mass lesion. Scattered foci T2 hyperintensity are seen within the white matter of the cerebral hemispheres, nonspecific. Mild  parenchymal volume loss.   IMPRESSION: 1. No acute intracranial abnormality. 2. Scattered foci of T2 hyperintensity within the white matter of the cerebral hemispheres, nonspecific but may represent mild chronic microvascular ischemic changes. 3. Mild parenchymal volume loss.    VITALS BP: 107/67 mmHg in seated HR: 70 bpm   COGNITION: Overall cognitive status: History of cognitive impairments - at baseline   SENSATION: Not tested  POSTURE: forward head and increased thoracic kyphosis   LOWER EXTREMITY MMT:    MMT Right Eval Left Eval  Hip flexion 4 4-  Hip extension    Hip abduction 4-** 4-**  Hip adduction 4* 4*  Hip internal rotation    Hip external rotation    Knee flexion 3+ 3  Knee extension 3+ 3  Ankle dorsiflexion 4 3+  Ankle plantarflexion    Ankle inversion    Ankle eversion    (Blank rows = not tested) **Hip abduction and adduction tested in seated  TRANSFERS: Assistive device utilized: None  Sit to stand: Complete Independence Stand to sit: Complete Independence Chair to chair: Complete Independence   STAIRS: Assess at next visit   GAIT: Gait pattern: two point step to with rolling walker, decreased stride length,  Right foot flat, Left foot flat, trunk flexed, narrow BOS, poor foot clearance- Right, and poor foot clearance- Left Distance walked: >500 ft Assistive device utilized: Environmental consultant - 2 wheeled Level of assistance: SBA for safety  Comments: forward flexed trunk, increased adduction of LE, path deviation to the left, when completing turns pt lifts walker off the ground, narrow BOS, left lateral lean , genuvarus of right LE dynamic in mid stance , trendelenburg on right hip for ligamentous lateral stability in midstance, gait deviations dramatically more deviated after 4 minutes of ambulation resultant cross-over and catching of toes on contralateral heel in swing phase bilaterally   FUNCTIONAL TESTS:  5 times sit to stand: 29.3 with UE on thigh   Timed up and go (TUG): 27.96 with two wheeled walker, 34.21 without AD 6 minute walk test: 520 ft with rolling walker, increased fatigue  Berg Balance Scale:.36/56  PATIENT SURVEYS:  FOTO 58 - completed with assistance from SPT and caretaker   TODAY'S TREATMENT:                                                                                                                              DATE: 02/27/23  Gait belt donned throughout for pt safety   TE: STS: 1x10, 1x6, 1x4, 1x2   5# ankle weights donned each LE: Seated march 3x10 LTL step outs in seated 2x10 each LE  Gait for strength/endurance x 444 ft with RW -  continued cuing for proximity to RW  - Rates medium  Nustep strength/endurance training  Lvl 1 x 4 min Lvl 2 x 4 min Cuing throughout for attention to task, speed, occ hands-on assist with reciprocal movement, SPM 50s-70s    STS 1x6  No pain with interventions  PATIENT EDUCATION: Education details: exercise technique Person educated: Patient and Caregiver   Education method: Explanation Education comprehension: verbalized understanding and needs further education  HOME EXERCISE PROGRAM: Access Code: [email or web address] URL:  [email or web address] Date: 01/08/2023 Prepared by: Grier Rocher  Exercises - Standing March with Counter Support  - 1 x daily - 7 x weekly - 3 sets - 10 reps - 2 hold - Standing Hip Abduction with Counter Support  - 1 x daily - 7 x weekly - 3 sets - 10 reps - 3 hold - Sit to Stand with Arms Crossed  - 1 x daily - 7 x weekly - 3 sets - 10 reps  GOALS: Goals reviewed with patient? Yes  SHORT TERM GOALS: Target date: 02/13/2023  Patient will be independent in home exercise program to improve strength/mobility for better functional independence with ADLs. Baseline: To be provided next visit  Goal status: INITIAL  2.  N/A Baseline:  Goal status: INITIAL    LONG TERM GOALS: Target date: 03/27/23  Patient will increase FOTO score to equal to or greater than   71  to demonstrate statistically significant improvement in mobility and quality of life.  Baseline: 58 Goal status: INITIAL  2.  Patient (> 63 years old) will complete five times sit to stand test in < 15 seconds indicating an increased LE strength and improved balance. Baseline: 29.33 Goal status: INITIAL  3.  Patient will increase six minute walk test distance to >1000 for progression to community ambulator and improve gait ability Baseline: 520 ft with walker Goal status: INITIAL  4.  Patient will reduce timed up and go to <15 seconds to reduce fall risk and demonstrate improved transfer/gait ability. Baseline: 27.96 with walker, 34.21 without AD Goal status: INITIAL  5.  Patient will increase Berg Balance score by > 6 points to demonstrate decreased fall risk during functional activities. Baseline: 36 Goal status: INITIAL  6.  Patient will ascend/descend 5 stairs with least restrictive assistive device without loss of balance to improve functional mobility.   Baseline: Assess next visit  Goal status: INITIAL  ASSESSMENT:  CLINICAL IMPRESSION: Pt able to advance therex today with use of heavier  weights. He tolerated these interventions well without pain and without reports of significant fatigue.  The pt will continue to benefit from skilled physical therapy decrease risk of falls, improve quality of life, and maximize function.     OBJECTIVE IMPAIRMENTS: Abnormal gait, decreased activity tolerance, decreased balance, decreased cognition, decreased coordination, decreased endurance, decreased knowledge of use of DME, decreased mobility, difficulty walking, decreased strength, decreased safety awareness, and improper body mechanics.   ACTIVITY LIMITATIONS: carrying, lifting, bending, standing, squatting, stairs, bathing, toileting, and dressing  PARTICIPATION LIMITATIONS: meal prep, cleaning, laundry, medication management, personal finances, driving, shopping, community activity, occupation, and yard work  PERSONAL FACTORS: Behavior pattern and 3+ comorbidities: sick sinus syndrome, vertebral artery stenosis, pacemaker implant, developmental delay disorder, hypertension, and seizure disorder.  are also affecting patient's functional outcome.  REHAB POTENTIAL: Good  CLINICAL DECISION MAKING: Evolving/moderate complexity  EVALUATION COMPLEXITY: Moderate  PLAN:  PT FREQUENCY: 1-2x/week  PT DURATION: 12 weeks  PLANNED INTERVENTIONS: 97110-Therapeutic exercises, 97530- Therapeutic activity, [id number]- Neuromuscular re-education, 97535- Self Care, [id number]- Manual therapy, [phone]- Gait training, [phone]- Orthotic Fit/training, [phone]- Electrical stimulation (unattended), Patient/Family education, Balance training, Stair training, Joint mobilization, Cryotherapy, and Moist heat  PLAN FOR NEXT SESSION:   develop HEP, LE strengthening dynamic balance and gait without AD  Baird Kay, PT 02/27/2023, 11:02 AM   Physical Therapist - [facility]  11:02 AM 02/27/23

## 2023-02-27 NOTE — Therapy (Addendum)
Occupational Therapy Treatment Note   [name] MRN: [id number] DOB:[birth year], 63 y.o., male Today's Date: 02/27/2023  PCP: Billee Cashing, MD REFERRING PROVIDER: Terisa Starr, MD  END OF SESSION:  OT End of Session - 02/27/23 1010     Visit Number 14    Number of Visits 24    Date for OT Re-Evaluation 03/27/23    OT Start Time 0939    OT Stop Time 1015    OT Time Calculation (min) 36 min    Activity Tolerance Patient tolerated treatment well    Behavior During Therapy [facility] for tasks assessed/performed             Past Medical History:  Diagnosis Date   Depression    Hypertension    MGUS (monoclonal gammopathy of unknown significance)    Pervasive developmental disorder    Seizures (HCC)    SSS (sick sinus syndrome) (HCC)    PPM placed   Past Surgical History:  Procedure Laterality Date   PACEMAKER IMPLANT     Patient Active Problem List   Diagnosis Date Noted   Vertebral artery stenosis 12/18/2022   Left-sided weakness 12/17/2022   MGUS (monoclonal gammopathy of unknown significance) 07/07/2021   Multifocal pneumonia 12/20/2018   Seizure disorder (HCC) 12/20/2018   COVID-19 virus infection 12/20/2018   Acute respiratory failure with hypoxia (HCC) 12/20/2018   Essential hypertension 12/20/2018   Hyponatremia 12/20/2018   Pancytopenia (HCC) 12/20/2018   Developmental delay disorder 12/20/2018   Pneumonia due to COVID-19 virus 12/20/2018   CKD (chronic kidney disease) stage 3, GFR 30-59 ml/min (HCC) 12/20/2018   Age-related nuclear cataract of both eyes 07/18/2017   Early dry stage nonexudative age-related macular degeneration of both eyes 07/18/2017   Myopia with astigmatism and presbyopia, bilateral 07/18/2017   Onychomycosis 06/29/2016   Risk for falls 02/10/2015   Pacemaker 11/06/2014   Septic shock (HCC) 11/06/2014   Dysarthria 10/31/2014   Altered mental status 10/30/2014   Thrombocytopenia (HCC) 10/08/2014   Medication  monitoring encounter 04/24/2014   Tremor 04/24/2014   Unsteadiness on feet 04/24/2014   History of nonmelanoma skin cancer 07/22/2013   Colon polyps 07/09/2013   Enuresis, primary, functional 05/09/2013   Gait disturbance 04/17/2013   Hemorrhoid 04/17/2013   Intention tremor 04/17/2013   Actinic keratosis 05/27/2010   Major depressive disorder, single episode, severe, with psychotic behavior (HCC) 03/21/2010   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   Pervasive developmental disorder 03/21/2010   Obsessive-compulsive disorder 03/07/2010   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   Obesity due to excess calories 02/25/2010    ONSET DATE: 12/28/23  REFERRING DIAG: Left sided weakness  THERAPY DIAG:  Muscle weakness (generalized)  Other lack of coordination  Rationale for Evaluation and Treatment: Rehabilitation  SUBJECTIVE:   SUBJECTIVE STATEMENT:  Pt. Reports that he is doing well today  Pt accompanied by:  Self  PERTINENT HISTORY:   Pt. is a 63 y.o. male who was admitted to the hospital with Left sided weakness, and left sided weakness, vertebral artery stenosis, sick sinus syndrome, Essential tremor, Pervasive Developmental Delay Disorder, Pacemaker implant s/p 6-7 years per caregiver. Pt. Resides at [facility], and attends [facility].   PRECAUTIONS: ICD/Pacemaker 6-7 years, Fall, Pureed diet, with sipping cup  WEIGHT BEARING RESTRICTIONS: No  PAIN:  Are you having pain? No  FALLS: Has patient fallen in last 6 months? Yes. Number of falls 2  LIVING ENVIRONMENT: Lives with: [name] Lives in:  No steps to enter Has following equipment at home: Walker - 2 wheeled and shower chair  PLOF: Independent with basic ADLs  PATIENT GOALS:    To be able to walk like everyone else  OBJECTIVE:  Note: Objective measures were completed at Evaluation unless otherwise noted.  HAND DOMINANCE: Left  ADLs:  Transfers/ambulation related to  ADLs: Eating: Independent uses a sipping cup, pureed diet Grooming: Independent with brushing teeth, caregiver assist with shaving UB Dressing: MinA LB Dressing: Supervision Toileting: Caregiver assist Bathing: Caregiver assist Tub Shower transfers: MinA Equipment: none  IADLs: Shopping: N/A Light housekeeping:  Caregiver Assist now Meal Prep: Pureed/ Group Home prepares all meals Community mobility:  Relies on transportation services Medication management: Caregivers provide medication management Financial management: Caregivers Assist Handwriting: 50% legible  MOBILITY STATUS: Needs Assist: RW and Hx of falls  POSTURE COMMENTS:  Leans to the left in supported  sitting   ACTIVITY TOLERANCE: Activity tolerance: Limited  FUNCTIONAL OUTCOME MEASURES: FOTO: 73  UPPER EXTREMITY ROM:    Active ROM Right eval Right 02/13/23 Left eval Right 02/13/23  Shoulder flexion 74(100) 95(110) 80(122) 94(125)  Shoulder abduction 73(94) 84(98) 70(98) 90(110)  Shoulder adduction      Shoulder extension      Shoulder internal rotation      Shoulder external rotation      Elbow flexion [facility] WFL  Elbow extension [facility] WFL  Wrist flexion [facility] WFL  Wrist extension [facility] WFL  Wrist ulnar deviation      Wrist radial deviation      Wrist pronation      Wrist supination      (Blank rows = not tested)  UPPER EXTREMITY MMT:     MMT Right eval Right 02/13/23 Left eval Left 02/13/23  Shoulder flexion 3-/5 3/5 3-/5 3/5  Shoulder abduction 3-/5 3/5 3-/5 3/5  Shoulder adduction      Shoulder extension      Shoulder internal rotation      Shoulder external rotation      Middle trapezius      Lower trapezius      Elbow flexion 4/5 4/5 4/5 4/5  Elbow extension 4-/5 4/5 4-/5 4/5  Wrist flexion 4-/5 4/5 4-/5 4/5  Wrist extension 4-/5 4/5 4-/5 4/5  Wrist ulnar deviation      Wrist radial deviation      Wrist pronation      Wrist supination      (Blank rows =  not tested)  HAND FUNCTION: Grip strength: Right: 38 lbs; Left: 27 lbs, Lateral pinch: Right: 13 lbs, Left: 14 lbs, and 3 point pinch: Right: 16 lbs, Left: 13 lbs  02/13/2023:  Grip strength: Right: 30 lbs; Left: 27 lbs, Lateral pinch: Right: 16 lbs, Left: 14 lbs, and 3 point pinch: Right: 18 lbs, Left: 14 lbs   COORDINATION: 9 Hole Peg test: Right: 55 sec; Left: 47 sec  02/13/2023:  9 Hole Peg test: Right: 1 min.; Left: 1 min. & 6 sec  SENSATION: Light Touch: TBD Proprioception: TBD  EDEMA: N/A  MUSCLE TONE: Ataxia  COGNITION: Overall cognitive status: Within functional limits for tasks assessed   Caregiver reports becomes agitated more easily    VISION ASSESSMENT:   Glasses  PERCEPTION: Intact  PRAXIS: Impaired: Motor planning   TODAY'S TREATMENT:  DATE: 02/27/2023    Therapeutic Ex.:  Pt. worked on the [employer] for 8 min. with constant monitoring of the BUEs. Pt. worked on forward motion for 6 min., and reverse motion for 2 min.  Neuromuscular re-education:  Pt. worked on [facility] skills grasping 1" sticks, 1/4" collars, and 1/4" washers. Pt. worked on storing the objects in the palm, and translatory skills moving the items from the palm of the hand to the tip of the 2nd digit, and thumb in preparation for placing them into the board.          PATIENT EDUCATION: Education details:  UE strengthening, [facility] Person educated: Patient and Child psychotherapist: Explanation, Demonstration, and Verbal cues Education comprehension: verbalized understanding, returned demonstration, verbal cues required, and needs further education  HOME EXERCISE PROGRAM:   To continue to assess, and provide as needed   GOALS: Goals reviewed with patient? Yes  SHORT TERM GOALS: Target date: 02/13/2023    Pt. Will require supervision with HEPs  for BE UEs. Baseline: 02/13/23:  Eval: No current HEP Goal status: INITIAL  LONG TERM GOALS: Target date: 03/27/2023    Pt. Will assume upright sitting with minimal cues for the duration of a 15 min. Task.  Baseline: 02/13/2023: Pt. Is improving with upright midline sitting, and presents with less left sided leaning. Eval: Pt. with positive left sided leaning. Goal status:  Progressing/Ongoing   2.  Pt. Will increase bilateral shoulder flexion ROM by 10 degrees to assist with bathing/washing his hair Baseline: 02/13/2023: shoulder flexion: Right: 95(110) Left: 94(125) Eval: shoulder flexion: Right: 74(100) Left: 62(952)  Goal status: Progressing/Ongoing  3.  Pt. Will improve bilateral shoulder abduction ROM by 10 degrees to assist with UE dressing skills. Baseline: 02/13/2023: shoulder abduction: Right: 84(95), Left: 90(110) Eval: shoulder abduction: Right: 73(94), Left: 70(98) Goal status: INITIAL  4.  Pt. Will improve left grip strength by 5# to be able to hold items securely Baseline: 02/13/2023: Right: 30# Left: 27# Eval: Right: 38# Left: 27# Goal status:  Ongoing  5.  Pt. Will improve [facility] skills by 10 sec to be able to independently, and efficiently manipulate small items. Baseline: 02/13/23: Left 1 min. & 6 sec. Eval: Left 55 sec Goal status Ongoing  6.  Pt. will increase FOTO score by 2 points to reflect Pt. perceived improvement with assessment specific ADL/IADL's.  Baseline: 02/13/23: 57 Eval: 73 Goal status: Ongoing  ASSESSMENT:  CLINICAL IMPRESSION:  Pt. tolerated the UE exercise on the [employer] without difficulty on level 4. Pt. was able to manipulate, and complete all of the bolts on the bolt board. Pt. P was able to efficiently grasp the 1" sticks, however had more difficulty grasping the 1/4" collars from a horizontal position in the shallow dish. Pt. Used 2 hands to perform translatory movements. Pt. Requires increased time, and multiple cues to complete the [facility] tasks. Pt.  continues to benefit from OT services for ADL training, A/E training, UE there. Ex., neuromuscular re-education, and pt./caregiver education.  PERFORMANCE DEFICITS: in functional skills including ADLs, IADLs, coordination, dexterity, ROM, strength, Fine motor control, Gross motor control, balance, and UE functional use, cognitive skills including problem solving and safety awareness, and psychosocial skills including coping strategies, environmental adaptation, interpersonal interactions, and routines and behaviors.   IMPAIRMENTS: are limiting patient from ADLs, IADLs, work, play, leisure, and social participation.   CO-MORBIDITIES: has co-morbidities such as Pervasive Developmental Delay Disorder  that affects occupational performance. Patient will benefit from skilled OT to address  above impairments and improve overall function.  MODIFICATION OR ASSISTANCE TO COMPLETE EVALUATION: Min-Moderate modification of tasks or assist with assess necessary to complete an evaluation.  OT OCCUPATIONAL PROFILE AND HISTORY: Detailed assessment: Review of records and additional review of physical, cognitive, psychosocial history related to current functional performance.  CLINICAL DECISION MAKING: Moderate - several treatment options, min-mod task modification necessary  REHAB POTENTIAL: Good  EVALUATION COMPLEXITY: Moderate    PLAN:  OT FREQUENCY: 2x/week  OT DURATION: 12 weeks  PLANNED INTERVENTIONS: 97535 self care/ADL training, 67341 therapeutic exercise, 97530 therapeutic activity, 97112 neuromuscular re-education, 97140 manual therapy, 97010 moist heat, 97034 contrast bath, balance training, patient/family education, and DME and/or AE instructions  RECOMMENDED OTHER SERVICES: PT & ST  CONSULTED AND AGREED WITH PLAN OF CARE: Patient and family member/caregiver  PLAN FOR NEXT SESSION: see above  Olegario Messier, MS, OTR/L   02/27/2023, 10:12 AM

## 2023-03-01 ENCOUNTER — Other Ambulatory Visit: Payer: Self-pay | Admitting: Family Medicine

## 2023-03-01 ENCOUNTER — Ambulatory Visit: Payer: Medicare Other | Admitting: Occupational Therapy

## 2023-03-01 ENCOUNTER — Ambulatory Visit: Payer: Medicare Other | Admitting: Physical Therapy

## 2023-03-01 DIAGNOSIS — M6281 Muscle weakness (generalized): Secondary | ICD-10-CM

## 2023-03-01 DIAGNOSIS — R278 Other lack of coordination: Secondary | ICD-10-CM

## 2023-03-01 DIAGNOSIS — R1313 Dysphagia, pharyngeal phase: Secondary | ICD-10-CM

## 2023-03-01 DIAGNOSIS — R262 Difficulty in walking, not elsewhere classified: Secondary | ICD-10-CM

## 2023-03-01 DIAGNOSIS — R053 Chronic cough: Secondary | ICD-10-CM

## 2023-03-01 DIAGNOSIS — R2681 Unsteadiness on feet: Secondary | ICD-10-CM

## 2023-03-01 DIAGNOSIS — R2689 Other abnormalities of gait and mobility: Secondary | ICD-10-CM

## 2023-03-01 NOTE — Therapy (Signed)
OUTPATIENT PHYSICAL THERAPY NEURO TREATMENT/  PHYSICAL THERAPY PROGRESS NOTE   Dates of reporting period  01/02/2023   to   03/01/2023    [name] MRN: [id number] DOB:[birth year], 63 y.o., male Today's Date: 03/01/2023   PCP: Domenic Schwab. Danae Chen, MD REFERRING PROVIDER: Rodney Cruise, MD  END OF SESSION:  PT End of Session - 03/01/23 0931     Visit Number 10    Number of Visits 24    Date for PT Re-Evaluation 03/27/23    PT Start Time 0932    PT Stop Time 1015    PT Time Calculation (min) 43 min    Equipment Utilized During Treatment Gait belt    Activity Tolerance Patient tolerated treatment well    Behavior During Therapy WFL for tasks assessed/performed                Past Medical History:  Diagnosis Date   Depression    Hypertension    MGUS (monoclonal gammopathy of unknown significance)    Pervasive developmental disorder    Seizures (HCC)    SSS (sick sinus syndrome) (HCC)    PPM placed   Past Surgical History:  Procedure Laterality Date   PACEMAKER IMPLANT     Patient Active Problem List   Diagnosis Date Noted   Vertebral artery stenosis 12/18/2022   Left-sided weakness 12/17/2022   MGUS (monoclonal gammopathy of unknown significance) 07/07/2021   Multifocal pneumonia 12/20/2018   Seizure disorder (HCC) 12/20/2018   COVID-19 virus infection 12/20/2018   Acute respiratory failure with hypoxia (HCC) 12/20/2018   Essential hypertension 12/20/2018   Hyponatremia 12/20/2018   Pancytopenia (HCC) 12/20/2018   Developmental delay disorder 12/20/2018   Pneumonia due to COVID-19 virus 12/20/2018   CKD (chronic kidney disease) stage 3, GFR 30-59 ml/min (HCC) 12/20/2018   Age-related nuclear cataract of both eyes 07/18/2017   Early dry stage nonexudative age-related macular degeneration of both eyes 07/18/2017   Myopia with astigmatism and presbyopia, bilateral 07/18/2017   Onychomycosis 06/29/2016   Risk for falls 02/10/2015    Pacemaker 11/06/2014   Septic shock (HCC) 11/06/2014   Dysarthria 10/31/2014   Altered mental status 10/30/2014   Thrombocytopenia (HCC) 10/08/2014   Medication monitoring encounter 04/24/2014   Tremor 04/24/2014   Unsteadiness on feet 04/24/2014   History of nonmelanoma skin cancer 07/22/2013   Colon polyps 07/09/2013   Enuresis, primary, functional 05/09/2013   Gait disturbance 04/17/2013   Hemorrhoid 04/17/2013   Intention tremor 04/17/2013   Actinic keratosis 05/27/2010   Major depressive disorder, single episode, severe, with psychotic behavior (HCC) 03/21/2010   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   Pervasive developmental disorder 03/21/2010   Obsessive-compulsive disorder 03/07/2010   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   Obesity due to excess calories 02/25/2010    ONSET DATE: November 2024  REFERRING DIAG:  Diagnosis  G45.9 (ICD-10-CM) - TIA (transient ischemic attack)    THERAPY DIAG:  Muscle weakness (generalized)  Unsteadiness on feet  Difficulty in walking, not elsewhere classified  Other lack of coordination  Other abnormalities of gait and mobility  Rationale for Evaluation and Treatment: Rehabilitation  SUBJECTIVE:  SUBJECTIVE STATEMENT: Pt reports pain currently, no updates. No falls since last week. Reports that he is still using RW at home at the request of group home.   Pt accompanied by:  self  PERTINENT HISTORY:  From eval  Pt is a 63 year old male presenting to physical therapy for issues concerning balance and strength. At baseline, pt has a history of intellectual disabilities and lives in a group home. Pt is accompanied by his caretaker where they report he had a "slight stroke" early November where he spent a couple of days at [facility] on the third floor.  According to ED notes from his stay (11/9-11/11), pt was brought in after a syncopal episode where they found no evidence of stroke on the CT, but significant vertebral artery stenosis. Prior to this recent admission, he was living in a group home and independent with most ADL's including getting himself dressed and bathing. Since then, he now requires assistance with basic ADL's and uses a two wheeled rolling walker for ambulation. He reports his left side is now "weaker" since his hospital stay. In the last six months, he has had two falls with both taking place in the bathroom. His caretaker reports one fall was when he was attempting to get out of the shower and hit his head on the sink. He has had two hospital admissions over the last year both related to falls. His typical day consists of going to school at the group home and watching television. He enjoys looking at antique shops in his free time and enjoys home improvement shows.   PMH: sick sinus syndrome, vertebral artery stenosis, pacemaker implant, I/DD, hypertension, and seizure disorder. Pt is referred to OPPT after recent increase in falls, syncope, concern for TIA. According to ED notes from his stay (11/9-11/11), pt was brought in after a syncopal episode where they found no evidence of stroke on the CT, but significant vertebral artery stenosis. While in the ED, pacemaker interrogated, ruled out. At baseline pt was living in a group home and independent with most ADL including getting himself dressed and bathing. Since then, he now requires assistance with basic ADL and uses a two wheeled rolling walker for ambulation. Several ED visits since October due to falls. Pt enjoys looking at antique shops in his free time and watching home improvement shows.    PAIN:  Are you having pain? No and pt reports achy feeling in the morning   PRECAUTIONS: Fall  WEIGHT BEARING RESTRICTIONS: No  FALLS: Has patient fallen in last 6 months? Yes. Number  of falls 2 Pt's caretaker reports one fall when trying to get out of the shower where he hit his head on the sink.   LIVING ENVIRONMENT: Lives with: lives in an adult home Lives in: Group home Stairs: No  Has following equipment at home: Dan Humphreys - 2 wheeled and shower chair rolling wl and shower chair   PLOF: Needs assistance with ADLs   PATIENT GOALS: work on stairs, walk like he used to  OBJECTIVE:  Note: Objective measures were completed at Evaluation unless otherwise noted.  DIAGNOSTIC FINDINGS:   MRI HEAD WITHOUT CONTRAST- 12/18/2022  COMPARISON:  Head CT December 16, 2022.   FINDINGS: Brain: Scattered foci no acute infarction, hemorrhage, hydrocephalus, extra-axial collection or mass lesion. Scattered foci T2 hyperintensity are seen within the white matter of the cerebral hemispheres, nonspecific. Mild parenchymal volume loss.   IMPRESSION: 1. No acute intracranial abnormality. 2. Scattered foci of T2 hyperintensity within  the white matter of the cerebral hemispheres, nonspecific but may represent mild chronic microvascular ischemic changes. 3. Mild parenchymal volume loss.    VITALS BP: 107/67 mmHg in seated HR: 70 bpm   COGNITION: Overall cognitive status: History of cognitive impairments - at baseline   SENSATION: Not tested  POSTURE: forward head and increased thoracic kyphosis   LOWER EXTREMITY MMT:    MMT Right Eval Left Eval  Hip flexion 4 4-  Hip extension    Hip abduction 4-** 4-**  Hip adduction 4* 4*  Hip internal rotation    Hip external rotation    Knee flexion 3+ 3  Knee extension 3+ 3  Ankle dorsiflexion 4 3+  Ankle plantarflexion    Ankle inversion    Ankle eversion    (Blank rows = not tested) **Hip abduction and adduction tested in seated  TRANSFERS: Assistive device utilized: None  Sit to stand: Complete Independence Stand to sit: Complete Independence Chair to chair: Complete Independence   STAIRS: Assess at next  visit   GAIT: Gait pattern: two point step to with rolling walker, decreased stride length, Right foot flat, Left foot flat, trunk flexed, narrow BOS, poor foot clearance- Right, and poor foot clearance- Left Distance walked: >500 ft Assistive device utilized: Environmental consultant - 2 wheeled Level of assistance: SBA for safety  Comments: forward flexed trunk, increased adduction of LE, path deviation to the left, when completing turns pt lifts walker off the ground, narrow BOS, left lateral lean , genuvarus of right LE dynamic in mid stance , trendelenburg on right hip for ligamentous lateral stability in midstance, gait deviations dramatically more deviated after 4 minutes of ambulation resultant cross-over and catching of toes on contralateral heel in swing phase bilaterally   FUNCTIONAL TESTS:  5 times sit to stand: 29.3 with UE on thigh   Timed up and go (TUG): 27.96 with two wheeled walker, 34.21 without AD 6 minute walk test: 520 ft with rolling walker, increased fatigue  Berg Balance Scale:.36/56  PATIENT SURVEYS:  FOTO 59 - completed with assistance from SPT and caretaker   TODAY'S TREATMENT:                                                                                                                              DATE: 03/01/23  PT instructed pt in goal assessment for progress note. See below for details   PATIENT EDUCATION: Education details: exercise technique Person educated: Patient and Caregiver   Education method: Explanation Education comprehension: verbalized understanding and needs further education  HOME EXERCISE PROGRAM: Access Code: [email or web address] URL: [email or web address] Date: 01/08/2023 Prepared by: Grier Rocher  Exercises - Standing March with Counter Support  - 1 x daily - 7 x weekly - 3 sets - 10 reps - 2 hold - Standing Hip Abduction with Counter Support  - 1 x daily - 7 x weekly - 3 sets - 10 reps - 3 hold - Sit to Stand with  Arms Crossed  - 1 x daily  - 7 x weekly - 3 sets - 10 reps  GOALS: Goals reviewed with patient? Yes  SHORT TERM GOALS: Target date: 02/13/2023  Patient will be independent in home exercise program to improve strength/mobility for better functional independence with ADLs. Baseline: To be provided next visit  Goal status: INITIAL  2.  N/A Baseline:  Goal status: INITIAL    LONG TERM GOALS: Target date: 03/27/23  Patient will increase FOTO score to equal to or greater than   71  to demonstrate statistically significant improvement in mobility and quality of life.  Baseline: 58 1/23: 54 (completed by pt with mental handicap)  Goal status: INITIAL  2.  Patient (> 56 years old) will complete five times sit to stand test in < 15 seconds indicating an increased LE strength and improved balance. Baseline: 29.33 1/23. 20.66sec  Goal status: in progress   3.  Patient will increase six minute walk test distance to >1000 for progression to community ambulator and improve gait ability Baseline: 520 ft with walker 1/23: 785ft. With RW  Goal status: In progress   4.  Patient will reduce timed up and go to <15 seconds to reduce fall risk and demonstrate improved transfer/gait ability. Baseline: 27.96 with walker, 34.21 without AD 1/23: 23.59 with with RW 18.87 without AD Goal status: in progress   5.  Patient will increase Berg Balance score by > 6 points to demonstrate decreased fall risk during functional activities. Baseline: 36 1/23: 40 Goal status: in progress   6.  Patient will ascend/descend 5 stairs with least restrictive assistive device without loss of balance to improve functional mobility.   Baseline: Assess next visit  1/23: supervision assist with BUE support on bil rails.  Goal status: in progress  ASSESSMENT:  CLINICAL IMPRESSION: Pt arrives motivated to participate. Instructed in goal assessment. Noted to have reduced fall risk and improved safety with funtional movement with reduced time on  Tug with and without AD. Increased distance in 6 min walk test with RW, improved berg balance scale by 4 points, and improved independence on stairs. Was noted to decrease on FOTO, but pt with mental handicap; questioning validity of responses without caregiver present. The pt will continue to benefit from skilled physical therapy decrease risk of falls, improve quality of life, and maximize function.     OBJECTIVE IMPAIRMENTS: Abnormal gait, decreased activity tolerance, decreased balance, decreased cognition, decreased coordination, decreased endurance, decreased knowledge of use of DME, decreased mobility, difficulty walking, decreased strength, decreased safety awareness, and improper body mechanics.   ACTIVITY LIMITATIONS: carrying, lifting, bending, standing, squatting, stairs, bathing, toileting, and dressing  PARTICIPATION LIMITATIONS: meal prep, cleaning, laundry, medication management, personal finances, driving, shopping, community activity, occupation, and yard work  PERSONAL FACTORS: Behavior pattern and 3+ comorbidities: sick sinus syndrome, vertebral artery stenosis, pacemaker implant, developmental delay disorder, hypertension, and seizure disorder.  are also affecting patient's functional outcome.  REHAB POTENTIAL: Good  CLINICAL DECISION MAKING: Evolving/moderate complexity  EVALUATION COMPLEXITY: Moderate  PLAN:  PT FREQUENCY: 1-2x/week  PT DURATION: 12 weeks  PLANNED INTERVENTIONS: 97110-Therapeutic exercises, 97530- Therapeutic activity, 97112- Neuromuscular re-education, 97535- Self Care, [id number]- Manual therapy, [phone]- Gait training, [phone]- Orthotic Fit/training, [phone]- Electrical stimulation (unattended), Patient/Family education, Balance training, Stair training, Joint mobilization, Cryotherapy, and Moist heat  PLAN FOR NEXT SESSION:   Expand HEP,  Continue LE strengthening dynamic balance and gait without AD     Golden Pop, PT 03/01/2023, 9:32  AM   Physical Therapist - [facility]  9:32 AM 03/01/23

## 2023-03-01 NOTE — Therapy (Signed)
Occupational Therapy Treatment Note   [name] MRN: [id number] DOB:Nov 23, 1960, 63 y.o., male Today's Date: 03/01/2023  PCP: Billee Cashing, MD REFERRING PROVIDER: Terisa Starr, MD  END OF SESSION:  OT End of Session - 03/01/23 0841     Visit Number 15    Number of Visits 24    Date for OT Re-Evaluation 03/27/23    OT Start Time 0820    OT Stop Time 0905    OT Time Calculation (min) 45 min    Activity Tolerance Patient tolerated treatment well    Behavior During Therapy [facility] for tasks assessed/performed             Past Medical History:  Diagnosis Date   Depression    Hypertension    MGUS (monoclonal gammopathy of unknown significance)    Pervasive developmental disorder    Seizures (HCC)    SSS (sick sinus syndrome) (HCC)    PPM placed   Past Surgical History:  Procedure Laterality Date   PACEMAKER IMPLANT     Patient Active Problem List   Diagnosis Date Noted   Vertebral artery stenosis 12/18/2022   Left-sided weakness 12/17/2022   MGUS (monoclonal gammopathy of unknown significance) 07/07/2021   Multifocal pneumonia 12/20/2018   Seizure disorder (HCC) 12/20/2018   COVID-19 virus infection 12/20/2018   Acute respiratory failure with hypoxia (HCC) 12/20/2018   Essential hypertension 12/20/2018   Hyponatremia 12/20/2018   Pancytopenia (HCC) 12/20/2018   Developmental delay disorder 12/20/2018   Pneumonia due to COVID-19 virus 12/20/2018   CKD (chronic kidney disease) stage 3, GFR 30-59 ml/min (HCC) 12/20/2018   Age-related nuclear cataract of both eyes 07/18/2017   Early dry stage nonexudative age-related macular degeneration of both eyes 07/18/2017   Myopia with astigmatism and presbyopia, bilateral 07/18/2017   Onychomycosis 06/29/2016   Risk for falls 02/10/2015   Pacemaker 11/06/2014   Septic shock (HCC) 11/06/2014   Dysarthria 10/31/2014   Altered mental status 10/30/2014   Thrombocytopenia (HCC) 10/08/2014   Medication  monitoring encounter 04/24/2014   Tremor 04/24/2014   Unsteadiness on feet 04/24/2014   History of nonmelanoma skin cancer 07/22/2013   Colon polyps 07/09/2013   Enuresis, primary, functional 05/09/2013   Gait disturbance 04/17/2013   Hemorrhoid 04/17/2013   Intention tremor 04/17/2013   Actinic keratosis 05/27/2010   Major depressive disorder, single episode, severe, with psychotic behavior (HCC) 03/21/2010   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   Pervasive developmental disorder 03/21/2010   Obsessive-compulsive disorder 03/07/2010   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   Obesity due to excess calories 02/25/2010    ONSET DATE: 12/28/23  REFERRING DIAG: Left sided weakness  THERAPY DIAG:  Muscle weakness (generalized)  Rationale for Evaluation and Treatment: Rehabilitation  SUBJECTIVE:   SUBJECTIVE STATEMENT:  Pt. reports that he is doing well today, but is not sure if his work will be open today because of the weather.   Pt accompanied by:  Self  PERTINENT HISTORY:   Pt. is a 63 y.o. male who was admitted to the hospital with Left sided weakness, and left sided weakness, vertebral artery stenosis, sick sinus syndrome, Essential tremor, Pervasive Developmental Delay Disorder, Pacemaker implant s/p 6-7 years per caregiver. Pt. Resides at [facility], and attends [employer].   PRECAUTIONS: ICD/Pacemaker 6-7 years, Fall, Pureed diet, with sipping cup  WEIGHT BEARING RESTRICTIONS: No  PAIN:  Are you having pain? No  FALLS: Has patient fallen in last 6 months? Yes. Number of falls 2  LIVING ENVIRONMENT: Lives with: [name] Lives in: No steps to enter Has following equipment at home: Walker - 2 wheeled and shower chair  PLOF: Independent with basic ADLs  PATIENT GOALS:    To be able to walk like everyone else  OBJECTIVE:  Note: Objective measures were completed at Evaluation unless otherwise noted.  HAND DOMINANCE:  Left  ADLs:  Transfers/ambulation related to ADLs: Eating: Independent uses a sipping cup, pureed diet Grooming: Independent with brushing teeth, caregiver assist with shaving UB Dressing: MinA LB Dressing: Supervision Toileting: Caregiver assist Bathing: Caregiver assist Tub Shower transfers: MinA Equipment: none  IADLs: Shopping: N/A Light housekeeping:  Caregiver Assist now Meal Prep: Pureed/ Group Home prepares all meals Community mobility:  Relies on transportation services Medication management: Caregivers provide medication management Financial management: Caregivers Assist Handwriting: 50% legible  MOBILITY STATUS: Needs Assist: RW and Hx of falls  POSTURE COMMENTS:  Leans to the left in supported  sitting   ACTIVITY TOLERANCE: Activity tolerance: Limited  FUNCTIONAL OUTCOME MEASURES: FOTO: 73  UPPER EXTREMITY ROM:    Active ROM Right eval Right 02/13/23 Left eval Right 02/13/23  Shoulder flexion 74(100) 95(110) 80(122) 94(125)  Shoulder abduction 73(94) 84(98) 70(98) 90(110)  Shoulder adduction      Shoulder extension      Shoulder internal rotation      Shoulder external rotation      Elbow flexion [facility] WFL  Elbow extension [facility] WFL  Wrist flexion [facility] WFL  Wrist extension [facility] WFL  Wrist ulnar deviation      Wrist radial deviation      Wrist pronation      Wrist supination      (Blank rows = not tested)  UPPER EXTREMITY MMT:     MMT Right eval Right 02/13/23 Left eval Left 02/13/23  Shoulder flexion 3-/5 3/5 3-/5 3/5  Shoulder abduction 3-/5 3/5 3-/5 3/5  Shoulder adduction      Shoulder extension      Shoulder internal rotation      Shoulder external rotation      Middle trapezius      Lower trapezius      Elbow flexion 4/5 4/5 4/5 4/5  Elbow extension 4-/5 4/5 4-/5 4/5  Wrist flexion 4-/5 4/5 4-/5 4/5  Wrist extension 4-/5 4/5 4-/5 4/5  Wrist ulnar deviation      Wrist radial deviation      Wrist  pronation      Wrist supination      (Blank rows = not tested)  HAND FUNCTION: Grip strength: Right: 38 lbs; Left: 27 lbs, Lateral pinch: Right: 13 lbs, Left: 14 lbs, and 3 point pinch: Right: 16 lbs, Left: 13 lbs  02/13/2023:  Grip strength: Right: 30 lbs; Left: 27 lbs, Lateral pinch: Right: 16 lbs, Left: 14 lbs, and 3 point pinch: Right: 18 lbs, Left: 14 lbs   COORDINATION: 9 Hole Peg test: Right: 55 sec; Left: 47 sec  02/13/2023:  9 Hole Peg test: Right: 1 min.; Left: 1 min. & 6 sec  SENSATION: Light Touch: TBD Proprioception: TBD  EDEMA: N/A  MUSCLE TONE: Ataxia  COGNITION: Overall cognitive status: Within functional limits for tasks assessed   Caregiver reports becomes agitated more easily    VISION ASSESSMENT:   Glasses  PERCEPTION: Intact  PRAXIS: Impaired: Motor planning   TODAY'S TREATMENT:  DATE: 03/01/2023    Therapeutic Ex.:  Pt. worked on the [employer] for 8 min. with constant monitoring of the BUEs. Pt. performed gross gripping with a gross grip strengthener. Pt. worked on sustaining left grip while grasping pegs and reaching at various heights. The gripper was set to 17.9# of grip strength resistance x's 2 trials.  Neuromuscular re-education:  Pt. worked on [facility] skills grasping 1" sticks, 1/4" collars, and 1/4" washers. Pt. worked on storing the objects in the palm, and translatory skills moving the items from the palm of the hand to the tip of the 2nd digit, and thumb in preparation for placing them into the board. Pt. Worked on removing them while alternating removing the vertical 1" sticks and placing them intot he shallow dish to the right and left of the board.          PATIENT EDUCATION: Education details:  UE strengthening, [facility] Person educated: Patient and Child psychotherapist: Explanation,  Demonstration, and Verbal cues Education comprehension: verbalized understanding, returned demonstration, verbal cues required, and needs further education  HOME EXERCISE PROGRAM:   To continue to assess, and provide as needed   GOALS: Goals reviewed with patient? Yes  SHORT TERM GOALS: Target date: 02/13/2023    Pt. Will require supervision with HEPs for BE UEs. Baseline: 02/13/23:  Eval: No current HEP Goal status: INITIAL  LONG TERM GOALS: Target date: 03/27/2023    Pt. Will assume upright sitting with minimal cues for the duration of a 15 min. Task.  Baseline: 02/13/2023: Pt. Is improving with upright midline sitting, and presents with less left sided leaning. Eval: Pt. with positive left sided leaning. Goal status:  Progressing/Ongoing   2.  Pt. Will increase bilateral shoulder flexion ROM by 10 degrees to assist with bathing/washing his hair Baseline: 02/13/2023: shoulder flexion: Right: 95(110) Left: 94(125) Eval: shoulder flexion: Right: 74(100) Left: 16(109)  Goal status: Progressing/Ongoing  3.  Pt. Will improve bilateral shoulder abduction ROM by 10 degrees to assist with UE dressing skills. Baseline: 02/13/2023: shoulder abduction: Right: 84(95), Left: 90(110) Eval: shoulder abduction: Right: 73(94), Left: 70(98) Goal status: INITIAL  4.  Pt. Will improve left grip strength by 5# to be able to hold items securely Baseline: 02/13/2023: Right: 30# Left: 27# Eval: Right: 38# Left: 27# Goal status:  Ongoing  5.  Pt. Will improve [facility] skills by 10 sec to be able to independently, and efficiently manipulate small items. Baseline: 02/13/23: Left 1 min. & 6 sec. Eval: Left 55 sec Goal status Ongoing  6.  Pt. will increase FOTO score by 2 points to reflect Pt. perceived improvement with assessment specific ADL/IADL's.  Baseline: 02/13/23: 57 Eval: 73 Goal status: Ongoing  ASSESSMENT:  CLINICAL IMPRESSION:  Pt. continues to tolerate UE exercise on level 4.0 on the [employer]  without difficulty. Pt. was able to manipulate, and complete all of the bolts on the bolt board. Pt. was able to efficiently grasp the 1" sticks, however had more difficulty grasping the 1/4" collars from a horizontal position in the shallow dish. Pt. is able to grasp, and store one object at a time consistently with one hand. Pt. presents with difficulty performing translatory movements with the hand for more than 1 object at a time. Pt. continues to engage both hands in the task to assist the opposite hand when performing translatory movements. Pt. requires increased time, and multiple cues to complete the [facility] tasks. Pt. continues to benefit from OT services for ADL training, A/E training,  UE there. Ex., neuromuscular re-education, and pt./caregiver education.  PERFORMANCE DEFICITS: in functional skills including ADLs, IADLs, coordination, dexterity, ROM, strength, Fine motor control, Gross motor control, balance, and UE functional use, cognitive skills including problem solving and safety awareness, and psychosocial skills including coping strategies, environmental adaptation, interpersonal interactions, and routines and behaviors.   IMPAIRMENTS: are limiting patient from ADLs, IADLs, work, play, leisure, and social participation.   CO-MORBIDITIES: has co-morbidities such as Pervasive Developmental Delay Disorder  that affects occupational performance. Patient will benefit from skilled OT to address above impairments and improve overall function.  MODIFICATION OR ASSISTANCE TO COMPLETE EVALUATION: Min-Moderate modification of tasks or assist with assess necessary to complete an evaluation.  OT OCCUPATIONAL PROFILE AND HISTORY: Detailed assessment: Review of records and additional review of physical, cognitive, psychosocial history related to current functional performance.  CLINICAL DECISION MAKING: Moderate - several treatment options, min-mod task modification necessary  REHAB POTENTIAL:  Good  EVALUATION COMPLEXITY: Moderate    PLAN:  OT FREQUENCY: 2x/week  OT DURATION: 12 weeks  PLANNED INTERVENTIONS: 97535 self care/ADL training, 16109 therapeutic exercise, 97530 therapeutic activity, 97112 neuromuscular re-education, 97140 manual therapy, 97010 moist heat, 97034 contrast bath, balance training, patient/family education, and DME and/or AE instructions  RECOMMENDED OTHER SERVICES: PT & ST  CONSULTED AND AGREED WITH PLAN OF CARE: Patient and family member/caregiver  PLAN FOR NEXT SESSION: see above  Olegario Messier, MS, OTR/L   03/01/2023, 8:44 AM

## 2023-03-06 ENCOUNTER — Encounter: Payer: Self-pay | Admitting: Physical Therapy

## 2023-03-06 ENCOUNTER — Ambulatory Visit: Payer: Medicare Other | Admitting: Occupational Therapy

## 2023-03-06 ENCOUNTER — Ambulatory Visit: Payer: Medicare Other | Admitting: Physical Therapy

## 2023-03-06 DIAGNOSIS — M6281 Muscle weakness (generalized): Secondary | ICD-10-CM

## 2023-03-06 DIAGNOSIS — R2689 Other abnormalities of gait and mobility: Secondary | ICD-10-CM

## 2023-03-06 DIAGNOSIS — R262 Difficulty in walking, not elsewhere classified: Secondary | ICD-10-CM

## 2023-03-06 DIAGNOSIS — R2681 Unsteadiness on feet: Secondary | ICD-10-CM

## 2023-03-06 NOTE — Therapy (Signed)
OUTPATIENT PHYSICAL THERAPY NEURO TREATMENT     [name] MRN: [id number] DOB:December 31, 1960, 63 y.o., male Today's Date: 03/06/2023   PCP: Domenic Schwab. Danae Chen, MD REFERRING PROVIDER: Rodney Cruise, MD  END OF SESSION:  PT End of Session - 03/06/23 1144     Visit Number 11    Number of Visits 24    Date for PT Re-Evaluation 03/27/23    PT Start Time 1145    PT Stop Time 1227    PT Time Calculation (min) 42 min    Equipment Utilized During Treatment Gait belt    Activity Tolerance Patient tolerated treatment well    Behavior During Therapy WFL for tasks assessed/performed                 Past Medical History:  Diagnosis Date   Depression    Hypertension    MGUS (monoclonal gammopathy of unknown significance)    Pervasive developmental disorder    Seizures (HCC)    SSS (sick sinus syndrome) (HCC)    PPM placed   Past Surgical History:  Procedure Laterality Date   PACEMAKER IMPLANT     Patient Active Problem List   Diagnosis Date Noted   Vertebral artery stenosis 12/18/2022   Left-sided weakness 12/17/2022   MGUS (monoclonal gammopathy of unknown significance) 07/07/2021   Multifocal pneumonia 12/20/2018   Seizure disorder (HCC) 12/20/2018   COVID-19 virus infection 12/20/2018   Acute respiratory failure with hypoxia (HCC) 12/20/2018   Essential hypertension 12/20/2018   Hyponatremia 12/20/2018   Pancytopenia (HCC) 12/20/2018   Developmental delay disorder 12/20/2018   Pneumonia due to COVID-19 virus 12/20/2018   CKD (chronic kidney disease) stage 3, GFR 30-59 ml/min (HCC) 12/20/2018   Age-related nuclear cataract of both eyes 07/18/2017   Early dry stage nonexudative age-related macular degeneration of both eyes 07/18/2017   Myopia with astigmatism and presbyopia, bilateral 07/18/2017   Onychomycosis 06/29/2016   Risk for falls 02/10/2015   Pacemaker 11/06/2014   Septic shock (HCC) 11/06/2014   Dysarthria 10/31/2014   Altered mental  status 10/30/2014   Thrombocytopenia (HCC) 10/08/2014   Medication monitoring encounter 04/24/2014   Tremor 04/24/2014   Unsteadiness on feet 04/24/2014   History of nonmelanoma skin cancer 07/22/2013   Colon polyps 07/09/2013   Enuresis, primary, functional 05/09/2013   Gait disturbance 04/17/2013   Hemorrhoid 04/17/2013   Intention tremor 04/17/2013   Actinic keratosis 05/27/2010   Major depressive disorder, single episode, severe, with psychotic behavior (HCC) 03/21/2010   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   Pervasive developmental disorder 03/21/2010   Obsessive-compulsive disorder 03/07/2010   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   Obesity due to excess calories 02/25/2010    ONSET DATE: November 2024  REFERRING DIAG:  Diagnosis  G45.9 (ICD-10-CM) - TIA (transient ischemic attack)    THERAPY DIAG:  Muscle weakness (generalized)  Unsteadiness on feet  Difficulty in walking, not elsewhere classified  Other abnormalities of gait and mobility  Rationale for Evaluation and Treatment: Rehabilitation  SUBJECTIVE:  SUBJECTIVE STATEMENT: Pt reports pain currently, no updates. No falls since last week.   Pt accompanied by:  self  PERTINENT HISTORY:  From eval  Pt is a 63 year old male presenting to physical therapy for issues concerning balance and strength. At baseline, pt has a history of intellectual disabilities and lives in a group home. Pt is accompanied by his caretaker where they report he had a "slight stroke" early November where he spent a couple of days at [facility] on the third floor. According to ED notes from his stay (11/9-11/11), pt was brought in after a syncopal episode where they found no evidence of stroke on the CT, but significant vertebral artery stenosis. Prior to this  recent admission, he was living in a group home and independent with most ADL's including getting himself dressed and bathing. Since then, he now requires assistance with basic ADL's and uses a two wheeled rolling walker for ambulation. He reports his left side is now "weaker" since his hospital stay. In the last six months, he has had two falls with both taking place in the bathroom. His caretaker reports one fall was when he was attempting to get out of the shower and hit his head on the sink. He has had two hospital admissions over the last year both related to falls. His typical day consists of going to school at the group home and watching television. He enjoys looking at antique shops in his free time and enjoys home improvement shows.   PMH: sick sinus syndrome, vertebral artery stenosis, pacemaker implant, I/DD, hypertension, and seizure disorder. Pt is referred to OPPT after recent increase in falls, syncope, concern for TIA. According to ED notes from his stay (11/9-11/11), pt was brought in after a syncopal episode where they found no evidence of stroke on the CT, but significant vertebral artery stenosis. While in the ED, pacemaker interrogated, ruled out. At baseline pt was living in a group home and independent with most ADL including getting himself dressed and bathing. Since then, he now requires assistance with basic ADL and uses a two wheeled rolling walker for ambulation. Several ED visits since October due to falls. Pt enjoys looking at antique shops in his free time and watching home improvement shows.    PAIN:  Are you having pain? No and pt reports achy feeling in the morning   PRECAUTIONS: Fall  WEIGHT BEARING RESTRICTIONS: No  FALLS: Has patient fallen in last 6 months? Yes. Number of falls 2 Pt's caretaker reports one fall when trying to get out of the shower where he hit his head on the sink.   LIVING ENVIRONMENT: Lives with: lives in an adult home Lives in: Group  home Stairs: No  Has following equipment at home: Dan Humphreys - 2 wheeled and shower chair rolling wl and shower chair   PLOF: Needs assistance with ADLs   PATIENT GOALS: work on stairs, walk like he used to  OBJECTIVE:  Note: Objective measures were completed at Evaluation unless otherwise noted.  DIAGNOSTIC FINDINGS:   MRI HEAD WITHOUT CONTRAST- 12/18/2022  COMPARISON:  Head CT December 16, 2022.   FINDINGS: Brain: Scattered foci no acute infarction, hemorrhage, hydrocephalus, extra-axial collection or mass lesion. Scattered foci T2 hyperintensity are seen within the white matter of the cerebral hemispheres, nonspecific. Mild parenchymal volume loss.   IMPRESSION: 1. No acute intracranial abnormality. 2. Scattered foci of T2 hyperintensity within the white matter of the cerebral hemispheres, nonspecific but may represent mild chronic microvascular ischemic  changes. 3. Mild parenchymal volume loss.    VITALS BP: 107/67 mmHg in seated HR: 70 bpm   COGNITION: Overall cognitive status: History of cognitive impairments - at baseline   SENSATION: Not tested  POSTURE: forward head and increased thoracic kyphosis   LOWER EXTREMITY MMT:    MMT Right Eval Left Eval  Hip flexion 4 4-  Hip extension    Hip abduction 4-** 4-**  Hip adduction 4* 4*  Hip internal rotation    Hip external rotation    Knee flexion 3+ 3  Knee extension 3+ 3  Ankle dorsiflexion 4 3+  Ankle plantarflexion    Ankle inversion    Ankle eversion    (Blank rows = not tested) **Hip abduction and adduction tested in seated  TRANSFERS: Assistive device utilized: None  Sit to stand: Complete Independence Stand to sit: Complete Independence Chair to chair: Complete Independence   STAIRS: Assess at next visit   GAIT: Gait pattern: two point step to with rolling walker, decreased stride length, Right foot flat, Left foot flat, trunk flexed, narrow BOS, poor foot clearance- Right, and poor foot  clearance- Left Distance walked: >500 ft Assistive device utilized: Environmental consultant - 2 wheeled Level of assistance: SBA for safety  Comments: forward flexed trunk, increased adduction of LE, path deviation to the left, when completing turns pt lifts walker off the ground, narrow BOS, left lateral lean , genuvarus of right LE dynamic in mid stance , trendelenburg on right hip for ligamentous lateral stability in midstance, gait deviations dramatically more deviated after 4 minutes of ambulation resultant cross-over and catching of toes on contralateral heel in swing phase bilaterally   FUNCTIONAL TESTS:  5 times sit to stand: 29.3 with UE on thigh   Timed up and go (TUG): 27.96 with two wheeled walker, 34.21 without AD 6 minute walk test: 520 ft with rolling walker, increased fatigue  Berg Balance Scale:.36/56  PATIENT SURVEYS:  FOTO 74 - completed with assistance from SPT and caretaker   TODAY'S TREATMENT:                                                                                                                              DATE: 03/06/23  STS x 10 no UE assist    TA: STS: 1x10,  Ambulation with 2 WW x 456 ft cues for posture and proximity to RW  STS x 8 reps no UE assist  Ambulation with 2 WW x 308 ft cues for posture and proximity to RW   TE 5# ankle weights donned each LE: Seated march 3x10 LAQ 2 x 10 with 5# AW, cues for full ROM  Step taps intermittent UE assist 2 x 10 reps  Knee flexion standing 2 x 10 reps  Hip abduction standing 2 x 10 ea LE  TA STS x 6 reps  Ambulation x 155 ft with RW, cues for proximity to walker    PATIENT EDUCATION: Education details:  exercise technique Person educated: Patient and Caregiver   Education method: Explanation Education comprehension: verbalized understanding and needs further education  HOME EXERCISE PROGRAM: Access Code: [email or web address] URL: [email or web address] Date: 01/08/2023 Prepared by: Grier Rocher  Exercises - Standing March with Counter Support  - 1 x daily - 7 x weekly - 3 sets - 10 reps - 2 hold - Standing Hip Abduction with Counter Support  - 1 x daily - 7 x weekly - 3 sets - 10 reps - 3 hold - Sit to Stand with Arms Crossed  - 1 x daily - 7 x weekly - 3 sets - 10 reps  GOALS: Goals reviewed with patient? Yes  SHORT TERM GOALS: Target date: 02/13/2023  Patient will be independent in home exercise program to improve strength/mobility for better functional independence with ADLs. Baseline: To be provided next visit  Goal status: INITIAL  2.  N/A Baseline:  Goal status: INITIAL    LONG TERM GOALS: Target date: 03/27/23  Patient will increase FOTO score to equal to or greater than   71  to demonstrate statistically significant improvement in mobility and quality of life.  Baseline: 58 1/23: 54 (completed by pt with mental handicap)  Goal status: INITIAL  2.  Patient (> 7 years old) will complete five times sit to stand test in < 15 seconds indicating an increased LE strength and improved balance. Baseline: 29.33 1/23. 20.66sec  Goal status: in progress   3.  Patient will increase six minute walk test distance to >1000 for progression to community ambulator and improve gait ability Baseline: 520 ft with walker 1/23: 723ft. With RW  Goal status: In progress   4.  Patient will reduce timed up and go to <15 seconds to reduce fall risk and demonstrate improved transfer/gait ability. Baseline: 27.96 with walker, 34.21 without AD 1/23: 23.59 with with RW 18.87 without AD Goal status: in progress   5.  Patient will increase Berg Balance score by > 6 points to demonstrate decreased fall risk during functional activities. Baseline: 36 1/23: 40 Goal status: in progress   6.  Patient will ascend/descend 5 stairs with least restrictive assistive device without loss of balance to improve functional mobility.   Baseline: Assess next visit  1/23: supervision assist  with BUE support on bil rails.  Goal status: in progress  ASSESSMENT:  CLINICAL IMPRESSION: Pt arrives motivated to participate. Pt continued with functional strengthening and ambulation with cues to improve performance throughout session. Pt follows instructions well but sometimes requires multiple repetitions of instruction to ensure proper performance. The pt will continue to benefit from skilled physical therapy decrease risk of falls, improve quality of life, and maximize function.     OBJECTIVE IMPAIRMENTS: Abnormal gait, decreased activity tolerance, decreased balance, decreased cognition, decreased coordination, decreased endurance, decreased knowledge of use of DME, decreased mobility, difficulty walking, decreased strength, decreased safety awareness, and improper body mechanics.   ACTIVITY LIMITATIONS: carrying, lifting, bending, standing, squatting, stairs, bathing, toileting, and dressing  PARTICIPATION LIMITATIONS: meal prep, cleaning, laundry, medication management, personal finances, driving, shopping, community activity, occupation, and yard work  PERSONAL FACTORS: Behavior pattern and 3+ comorbidities: sick sinus syndrome, vertebral artery stenosis, pacemaker implant, developmental delay disorder, hypertension, and seizure disorder.  are also affecting patient's functional outcome.  REHAB POTENTIAL: Good  CLINICAL DECISION MAKING: Evolving/moderate complexity  EVALUATION COMPLEXITY: Moderate  PLAN:  PT FREQUENCY: 1-2x/week  PT DURATION: 12 weeks  PLANNED INTERVENTIONS: 97110-Therapeutic exercises, 97530- Therapeutic activity, [id number]- Neuromuscular re-education, 97535- Self  Care, 16109- Manual therapy, [phone]- Gait training, [phone]- Orthotic Fit/training, [phone]- Electrical stimulation (unattended), Patient/Family education, Balance training, Stair training, Joint mobilization, Cryotherapy, and Moist heat  PLAN FOR NEXT SESSION:   Expand HEP,  Continue LE strengthening  dynamic balance and gait without AD     Norman Herrlich, PT 03/06/2023, 1:12 PM   Physical Therapist - [facility]  [facility]  1:12 PM 03/06/23

## 2023-03-06 NOTE — Therapy (Signed)
Occupational Therapy Treatment Note   [name] MRN: [id number] DOB:05-02-1960, 63 y.o., male Today's Date: 03/06/2023  PCP: Billee Cashing, MD REFERRING PROVIDER: Terisa Starr, MD  END OF SESSION:  OT End of Session - 03/06/23 1125     Visit Number 16    Number of Visits 24    Date for OT Re-Evaluation 03/27/23    OT Start Time 1100    OT Stop Time 1145    OT Time Calculation (min) 45 min    Activity Tolerance Patient tolerated treatment well    Behavior During Therapy WFL for tasks assessed/performed             Past Medical History:  Diagnosis Date   Depression    Hypertension    MGUS (monoclonal gammopathy of unknown significance)    Pervasive developmental disorder    Seizures (HCC)    SSS (sick sinus syndrome) (HCC)    PPM placed   Past Surgical History:  Procedure Laterality Date   PACEMAKER IMPLANT     Patient Active Problem List   Diagnosis Date Noted   Vertebral artery stenosis 12/18/2022   Left-sided weakness 12/17/2022   MGUS (monoclonal gammopathy of unknown significance) 07/07/2021   Multifocal pneumonia 12/20/2018   Seizure disorder (HCC) 12/20/2018   COVID-19 virus infection 12/20/2018   Acute respiratory failure with hypoxia (HCC) 12/20/2018   Essential hypertension 12/20/2018   Hyponatremia 12/20/2018   Pancytopenia (HCC) 12/20/2018   Developmental delay disorder 12/20/2018   Pneumonia due to COVID-19 virus 12/20/2018   CKD (chronic kidney disease) stage 3, GFR 30-59 ml/min (HCC) 12/20/2018   Age-related nuclear cataract of both eyes 07/18/2017   Early dry stage nonexudative age-related macular degeneration of both eyes 07/18/2017   Myopia with astigmatism and presbyopia, bilateral 07/18/2017   Onychomycosis 06/29/2016   Risk for falls 02/10/2015   Pacemaker 11/06/2014   Septic shock (HCC) 11/06/2014   Dysarthria 10/31/2014   Altered mental status 10/30/2014   Thrombocytopenia (HCC) 10/08/2014   Medication  monitoring encounter 04/24/2014   Tremor 04/24/2014   Unsteadiness on feet 04/24/2014   History of nonmelanoma skin cancer 07/22/2013   Colon polyps 07/09/2013   Enuresis, primary, functional 05/09/2013   Gait disturbance 04/17/2013   Hemorrhoid 04/17/2013   Intention tremor 04/17/2013   Actinic keratosis 05/27/2010   Major depressive disorder, single episode, severe, with psychotic behavior (HCC) 03/21/2010   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   Pervasive developmental disorder 03/21/2010   Obsessive-compulsive disorder 03/07/2010   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   Obesity due to excess calories 02/25/2010    ONSET DATE: 12/28/23  REFERRING DIAG: Left sided weakness  THERAPY DIAG:  Muscle weakness (generalized)  Rationale for Evaluation and Treatment: Rehabilitation  SUBJECTIVE:   SUBJECTIVE STATEMENT:  Pt. reports doing well today  Pt accompanied by:  Self  PERTINENT HISTORY:   Pt. is a 63 y.o. male who was admitted to the hospital with Left sided weakness, and left sided weakness, vertebral artery stenosis, sick sinus syndrome, Essential tremor, Pervasive Developmental Delay Disorder, Pacemaker implant s/p 6-7 years per caregiver. Pt. Resides at [facility], and attends [facility].   PRECAUTIONS: ICD/Pacemaker 6-7 years, Fall, Pureed diet, with sipping cup  WEIGHT BEARING RESTRICTIONS: No  PAIN:  Are you having pain? No  FALLS: Has patient fallen in last 6 months? Yes. Number of falls 2  LIVING ENVIRONMENT: Lives with: [name] Lives in: No steps to enter Has following equipment at  home: Walker - 2 wheeled and shower chair  PLOF: Independent with basic ADLs  PATIENT GOALS:    To be able to walk like everyone else  OBJECTIVE:  Note: Objective measures were completed at Evaluation unless otherwise noted.  HAND DOMINANCE: Left  ADLs:  Transfers/ambulation related to ADLs: Eating: Independent uses a sipping  cup, pureed diet Grooming: Independent with brushing teeth, caregiver assist with shaving UB Dressing: MinA LB Dressing: Supervision Toileting: Caregiver assist Bathing: Caregiver assist Tub Shower transfers: MinA Equipment: none  IADLs: Shopping: N/A Light housekeeping:  Caregiver Assist now Meal Prep: Pureed/ Group Home prepares all meals Community mobility:  Relies on transportation services Medication management: Caregivers provide medication management Financial management: Caregivers Assist Handwriting: 50% legible  MOBILITY STATUS: Needs Assist: RW and Hx of falls  POSTURE COMMENTS:  Leans to the left in supported  sitting   ACTIVITY TOLERANCE: Activity tolerance: Limited  FUNCTIONAL OUTCOME MEASURES: FOTO: 73  UPPER EXTREMITY ROM:    Active ROM Right eval Right 02/13/23 Left eval Right 02/13/23  Shoulder flexion 74(100) 95(110) 80(122) 94(125)  Shoulder abduction 73(94) 84(98) 70(98) 90(110)  Shoulder adduction      Shoulder extension      Shoulder internal rotation      Shoulder external rotation      Elbow flexion [facility] WFL  Elbow extension [facility] WFL  Wrist flexion [facility] WFL  Wrist extension [facility] WFL  Wrist ulnar deviation      Wrist radial deviation      Wrist pronation      Wrist supination      (Blank rows = not tested)  UPPER EXTREMITY MMT:     MMT Right eval Right 02/13/23 Left eval Left 02/13/23  Shoulder flexion 3-/5 3/5 3-/5 3/5  Shoulder abduction 3-/5 3/5 3-/5 3/5  Shoulder adduction      Shoulder extension      Shoulder internal rotation      Shoulder external rotation      Middle trapezius      Lower trapezius      Elbow flexion 4/5 4/5 4/5 4/5  Elbow extension 4-/5 4/5 4-/5 4/5  Wrist flexion 4-/5 4/5 4-/5 4/5  Wrist extension 4-/5 4/5 4-/5 4/5  Wrist ulnar deviation      Wrist radial deviation      Wrist pronation      Wrist supination      (Blank rows = not tested)  HAND FUNCTION: Grip  strength: Right: 38 lbs; Left: 27 lbs, Lateral pinch: Right: 13 lbs, Left: 14 lbs, and 3 point pinch: Right: 16 lbs, Left: 13 lbs  02/13/2023:  Grip strength: Right: 30 lbs; Left: 27 lbs, Lateral pinch: Right: 16 lbs, Left: 14 lbs, and 3 point pinch: Right: 18 lbs, Left: 14 lbs   COORDINATION: 9 Hole Peg test: Right: 55 sec; Left: 47 sec  02/13/2023:  9 Hole Peg test: Right: 1 min.; Left: 1 min. & 6 sec  SENSATION: Light Touch: TBD Proprioception: TBD  EDEMA: N/A  MUSCLE TONE: Ataxia  COGNITION: Overall cognitive status: Within functional limits for tasks assessed   Caregiver reports becomes agitated more easily    VISION ASSESSMENT:   Glasses  PERCEPTION: Intact  PRAXIS: Impaired: Motor planning   TODAY'S TREATMENT:  DATE: 03/06/2023    Therapeutic Activities:  Pt. worked on grasping, 2" large pegs from a container, and placing them on the [employer] placed at a tabletop surface. Pt. worked on the accuracy of following large complex design patterns. Pt. Worked on removing the pegs by grasping, and storing 3 pegs in the hand in preparation for placing them into the container. A speed component was added to further increase the challenge.complexity of the task.           PATIENT EDUCATION: Education details:  UE strengthening, FMC Person educated: Patient and Child psychotherapist: Explanation, Demonstration, and Verbal cues Education comprehension: verbalized understanding, returned demonstration, verbal cues required, and needs further education  HOME EXERCISE PROGRAM:   To continue to assess, and provide as needed   GOALS: Goals reviewed with patient? Yes  SHORT TERM GOALS: Target date: 02/13/2023    Pt. Will require supervision with HEPs for BE UEs. Baseline: 02/13/23:  Eval: No current HEP Goal status:  INITIAL  LONG TERM GOALS: Target date: 03/27/2023    Pt. Will assume upright sitting with minimal cues for the duration of a 15 min. Task.  Baseline: 02/13/2023: Pt. Is improving with upright midline sitting, and presents with less left sided leaning. Eval: Pt. with positive left sided leaning. Goal status:  Progressing/Ongoing   2.  Pt. Will increase bilateral shoulder flexion ROM by 10 degrees to assist with bathing/washing his hair Baseline: 02/13/2023: shoulder flexion: Right: 95(110) Left: 94(125) Eval: shoulder flexion: Right: 74(100) Left: 26(948)  Goal status: Progressing/Ongoing  3.  Pt. Will improve bilateral shoulder abduction ROM by 10 degrees to assist with UE dressing skills. Baseline: 02/13/2023: shoulder abduction: Right: 84(95), Left: 90(110) Eval: shoulder abduction: Right: 73(94), Left: 70(98) Goal status: INITIAL  4.  Pt. Will improve left grip strength by 5# to be able to hold items securely Baseline: 02/13/2023: Right: 30# Left: 27# Eval: Right: 38# Left: 27# Goal status:  Ongoing  5.  Pt. Will improve [facility] skills by 10 sec to be able to independently, and efficiently manipulate small items. Baseline: 02/13/23: Left 1 min. & 6 sec. Eval: Left 55 sec Goal status Ongoing  6.  Pt. will increase FOTO score by 2 points to reflect Pt. perceived improvement with assessment specific ADL/IADL's.  Baseline: 02/13/23: 57 Eval: 73 Goal status: Ongoing  ASSESSMENT:  CLINICAL IMPRESSION:  Pt. was able to complete UE reciprocal motion on the SciFit at level 2.0 for 8 min. No rest breaks were required. Pt. was able to grasp, flip, and place the pegs from the container, and place them onto the pegboard. Pt. required minimal verbal cues for accuracy following the design patter. Pt. With cues Pt. Was able to initiate making corrections for the misidentified pegs. Pt. continues to benefit from OT services for ADL training, A/E training, UE there. Ex., neuromuscular re-education, and  pt./caregiver education.  PERFORMANCE DEFICITS: in functional skills including ADLs, IADLs, coordination, dexterity, ROM, strength, Fine motor control, Gross motor control, balance, and UE functional use, cognitive skills including problem solving and safety awareness, and psychosocial skills including coping strategies, environmental adaptation, interpersonal interactions, and routines and behaviors.   IMPAIRMENTS: are limiting patient from ADLs, IADLs, work, play, leisure, and social participation.   CO-MORBIDITIES: has co-morbidities such as Pervasive Developmental Delay Disorder  that affects occupational performance. Patient will benefit from skilled OT to address above impairments and improve overall function.  MODIFICATION OR ASSISTANCE TO COMPLETE EVALUATION: Min-Moderate modification of tasks or assist with assess  necessary to complete an evaluation.  OT OCCUPATIONAL PROFILE AND HISTORY: Detailed assessment: Review of records and additional review of physical, cognitive, psychosocial history related to current functional performance.  CLINICAL DECISION MAKING: Moderate - several treatment options, min-mod task modification necessary  REHAB POTENTIAL: Good  EVALUATION COMPLEXITY: Moderate    PLAN:  OT FREQUENCY: 2x/week  OT DURATION: 12 weeks  PLANNED INTERVENTIONS: 97535 self care/ADL training, 16109 therapeutic exercise, 97530 therapeutic activity, 97112 neuromuscular re-education, 97140 manual therapy, 97010 moist heat, 97034 contrast bath, balance training, patient/family education, and DME and/or AE instructions  RECOMMENDED OTHER SERVICES: PT & ST  CONSULTED AND AGREED WITH PLAN OF CARE: Patient and family member/caregiver  PLAN FOR NEXT SESSION: see above  Olegario Messier, MS, OTR/L   03/06/2023, 11:28 AM

## 2023-03-08 ENCOUNTER — Ambulatory Visit: Payer: Medicare Other | Admitting: Occupational Therapy

## 2023-03-08 DIAGNOSIS — M6281 Muscle weakness (generalized): Secondary | ICD-10-CM | POA: Diagnosis not present

## 2023-03-08 DIAGNOSIS — R278 Other lack of coordination: Secondary | ICD-10-CM

## 2023-03-08 NOTE — Therapy (Signed)
Occupational Therapy Treatment Note   [name] MRN: [id number] DOB:10/01/1960, 63 y.o., male Today's Date: 03/08/2023  PCP: Billee Cashing, MD REFERRING PROVIDER: Terisa Starr, MD  END OF SESSION:  OT End of Session - 03/08/23 0952     Visit Number 17    Date for OT Re-Evaluation 03/27/23    OT Start Time 0930    OT Stop Time 1015    OT Time Calculation (min) 45 min    Activity Tolerance Patient tolerated treatment well    Behavior During Therapy [facility] for tasks assessed/performed             Past Medical History:  Diagnosis Date   Depression    Hypertension    MGUS (monoclonal gammopathy of unknown significance)    Pervasive developmental disorder    Seizures (HCC)    SSS (sick sinus syndrome) (HCC)    PPM placed   Past Surgical History:  Procedure Laterality Date   PACEMAKER IMPLANT     Patient Active Problem List   Diagnosis Date Noted   Vertebral artery stenosis 12/18/2022   Left-sided weakness 12/17/2022   MGUS (monoclonal gammopathy of unknown significance) 07/07/2021   Multifocal pneumonia 12/20/2018   Seizure disorder (HCC) 12/20/2018   COVID-19 virus infection 12/20/2018   Acute respiratory failure with hypoxia (HCC) 12/20/2018   Essential hypertension 12/20/2018   Hyponatremia 12/20/2018   Pancytopenia (HCC) 12/20/2018   Developmental delay disorder 12/20/2018   Pneumonia due to COVID-19 virus 12/20/2018   CKD (chronic kidney disease) stage 3, GFR 30-59 ml/min (HCC) 12/20/2018   Age-related nuclear cataract of both eyes 07/18/2017   Early dry stage nonexudative age-related macular degeneration of both eyes 07/18/2017   Myopia with astigmatism and presbyopia, bilateral 07/18/2017   Onychomycosis 06/29/2016   Risk for falls 02/10/2015   Pacemaker 11/06/2014   Septic shock (HCC) 11/06/2014   Dysarthria 10/31/2014   Altered mental status 10/30/2014   Thrombocytopenia (HCC) 10/08/2014   Medication monitoring encounter  04/24/2014   Tremor 04/24/2014   Unsteadiness on feet 04/24/2014   History of nonmelanoma skin cancer 07/22/2013   Colon polyps 07/09/2013   Enuresis, primary, functional 05/09/2013   Gait disturbance 04/17/2013   Hemorrhoid 04/17/2013   Intention tremor 04/17/2013   Actinic keratosis 05/27/2010   Major depressive disorder, single episode, severe, with psychotic behavior (HCC) 03/21/2010   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   Pervasive developmental disorder 03/21/2010   Obsessive-compulsive disorder 03/07/2010   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   Obesity due to excess calories 02/25/2010    ONSET DATE: 12/28/23  REFERRING DIAG: Left sided weakness  THERAPY DIAG:  Muscle weakness (generalized)  Other lack of coordination  Rationale for Evaluation and Treatment: Rehabilitation  SUBJECTIVE:   SUBJECTIVE STATEMENT:  Pt. reports  that Frank Conley had to go to the office, and that Frank Conley pick him up after therapy.  Pt accompanied by:  Self  PERTINENT HISTORY:   Pt. is a 63 y.o. male who was admitted to the hospital with Left sided weakness, and left sided weakness, vertebral artery stenosis, sick sinus syndrome, Essential tremor, Pervasive Developmental Delay Disorder, Pacemaker implant s/p 6-7 years per caregiver. Pt. Resides at [facility], and attends [facility].   PRECAUTIONS: ICD/Pacemaker 6-7 years, Fall, Pureed diet, with sipping cup  WEIGHT BEARING RESTRICTIONS: No  PAIN:  Are you having pain? No  FALLS: Has patient fallen in last 6 months? Yes. Number of falls 2  LIVING ENVIRONMENT: Lives with: Frank Conley  Group Home Lives in: No steps to enter Has following equipment at home: Walker - 2 wheeled and shower chair  PLOF: Independent with basic ADLs  PATIENT GOALS:    To be able to walk like everyone else  OBJECTIVE:  Note: Objective measures were completed at Evaluation unless otherwise noted.  HAND DOMINANCE:  Left  ADLs:  Transfers/ambulation related to ADLs: Eating: Independent uses a sipping cup, pureed diet Grooming: Independent with brushing teeth, caregiver assist with shaving UB Dressing: MinA LB Dressing: Supervision Toileting: Caregiver assist Bathing: Caregiver assist Tub Shower transfers: MinA Equipment: none  IADLs: Shopping: N/A Light housekeeping:  Caregiver Assist now Meal Prep: Pureed/ Group Home prepares all meals Community mobility:  Relies on transportation services Medication management: Caregivers provide medication management Financial management: Caregivers Assist Handwriting: 50% legible  MOBILITY STATUS: Needs Assist: RW and Hx of falls  POSTURE COMMENTS:  Leans to the left in supported  sitting   ACTIVITY TOLERANCE: Activity tolerance: Limited  FUNCTIONAL OUTCOME MEASURES: FOTO: 73  UPPER EXTREMITY ROM:    Active ROM Right eval Right 02/13/23 Left eval Right 02/13/23  Shoulder flexion 74(100) 95(110) 80(122) 94(125)  Shoulder abduction 73(94) 84(98) 70(98) 90(110)  Shoulder adduction      Shoulder extension      Shoulder internal rotation      Shoulder external rotation      Elbow flexion [facility] WFL  Elbow extension [facility] WFL  Wrist flexion [facility] WFL  Wrist extension [facility] WFL  Wrist ulnar deviation      Wrist radial deviation      Wrist pronation      Wrist supination      (Blank rows = not tested)  UPPER EXTREMITY MMT:     MMT Right eval Right 02/13/23 Left eval Left 02/13/23  Shoulder flexion 3-/5 3/5 3-/5 3/5  Shoulder abduction 3-/5 3/5 3-/5 3/5  Shoulder adduction      Shoulder extension      Shoulder internal rotation      Shoulder external rotation      Middle trapezius      Lower trapezius      Elbow flexion 4/5 4/5 4/5 4/5  Elbow extension 4-/5 4/5 4-/5 4/5  Wrist flexion 4-/5 4/5 4-/5 4/5  Wrist extension 4-/5 4/5 4-/5 4/5  Wrist ulnar deviation      Wrist radial deviation      Wrist  pronation      Wrist supination      (Blank rows = not tested)  HAND FUNCTION: Grip strength: Right: 38 lbs; Left: 27 lbs, Lateral pinch: Right: 13 lbs, Left: 14 lbs, and 3 point pinch: Right: 16 lbs, Left: 13 lbs  02/13/2023:  Grip strength: Right: 30 lbs; Left: 27 lbs, Lateral pinch: Right: 16 lbs, Left: 14 lbs, and 3 point pinch: Right: 18 lbs, Left: 14 lbs   COORDINATION: 9 Hole Peg test: Right: 55 sec; Left: 47 sec  02/13/2023:  9 Hole Peg test: Right: 1 min.; Left: 1 min. & 6 sec  SENSATION: Light Touch: TBD Proprioception: TBD  EDEMA: N/A  MUSCLE TONE: Ataxia  COGNITION: Overall cognitive status: Within functional limits for tasks assessed   Caregiver reports becomes agitated more easily    VISION ASSESSMENT:   Glasses  PERCEPTION: Intact  PRAXIS: Impaired: Motor planning   TODAY'S TREATMENT:  DATE: 03/08/2023   Therapeutic Ex:  Pt. worked on the [employer] for 8 min. On level 4.0. with constant monitoring of the BUEs. Pt. worked on changing, and alternating forward reverse position. Pt. performed gross gripping with a gross grip strengthener. Pt. worked on sustaining grip while grasping pegs and reaching at various heights. The gripper was set to 11.2# of grip strength resistance.   Neuromuscular re-ed:  Pt. worked on grasping 1" resistive cubes alternating thumb opposition to the tip of the 2nd digit with the board placed at  flat at the tabletop surface. Pt. worked on pressing the cubes back into place while alternating isolated 2nd through 5th digit extension. Pt. worked on alternating between the 2 colors of cubes. Pt. performed [facility] tasks using the Grooved pegboard. Pt. worked on grasping the grooved pegs from a horizontal position, and moving the pegs to a vertical position in the hand to prepare for placing them in the grooved slot.             PATIENT EDUCATION: Education details:  UE strengthening, FMC Person educated: Patient and Child psychotherapist: Explanation, Demonstration, and Verbal cues Education comprehension: verbalized understanding, returned demonstration, verbal cues required, and needs further education  HOME EXERCISE PROGRAM:   To continue to assess, and provide as needed   GOALS: Goals reviewed with patient? Yes  SHORT TERM GOALS: Target date: 02/13/2023    Pt. Will require supervision with HEPs for BE UEs. Baseline: 02/13/23:  Eval: No current HEP Goal status: INITIAL  LONG TERM GOALS: Target date: 03/27/2023    Pt. Will assume upright sitting with minimal cues for the duration of a 15 min. Task.  Baseline: 02/13/2023: Pt. Is improving with upright midline sitting, and presents with less left sided leaning. Eval: Pt. with positive left sided leaning. Goal status:  Progressing/Ongoing   2.  Pt. Will increase bilateral shoulder flexion ROM by 10 degrees to assist with bathing/washing his hair Baseline: 02/13/2023: shoulder flexion: Right: 95(110) Left: 94(125) Eval: shoulder flexion: Right: 74(100) Left: 84(132)  Goal status: Progressing/Ongoing  3.  Pt. Will improve bilateral shoulder abduction ROM by 10 degrees to assist with UE dressing skills. Baseline: 02/13/2023: shoulder abduction: Right: 84(95), Left: 90(110) Eval: shoulder abduction: Right: 73(94), Left: 70(98) Goal status: INITIAL  4.  Pt. Will improve left grip strength by 5# to be able to hold items securely Baseline: 02/13/2023: Right: 30# Left: 27# Eval: Right: 38# Left: 27# Goal status:  Ongoing  5.  Pt. Will improve [facility] skills by 10 sec to be able to independently, and efficiently manipulate small items. Baseline: 02/13/23: Left 1 min. & 6 sec. Eval: Left 55 sec Goal status Ongoing  6.  Pt. will increase FOTO score by 2 points to reflect Pt. perceived improvement with assessment specific ADL/IADL's.   Baseline: 02/13/23: 57 Eval: 73 Goal status: Ongoing  ASSESSMENT:  CLINICAL IMPRESSION:  Pt. was able to complete UE reciprocal motion on the SciFit at level 2.0 for 8 min. rest breaks were required. Pt. required cues initially for vertical placement, and positioning of the grip strengthener in his hand. After initial cues, Pt. was able to hold, and sustain the gripper in a vertical position in preparation for moving the pegs.  Pt. was able to maintain a 3pt. grasp while removing the the cubes from the resistive velcro board, and isolate the 2nd digit extension when pressing them into place. Pt. was able to alternate cube colors after initial cuing. Pt. continues to benefit from  OT services for ADL training, A/E training, UE there. Ex., neuromuscular re-education, and Pt./caregiver education.  PERFORMANCE DEFICITS: in functional skills including ADLs, IADLs, coordination, dexterity, ROM, strength, Fine motor control, Gross motor control, balance, and UE functional use, cognitive skills including problem solving and safety awareness, and psychosocial skills including coping strategies, environmental adaptation, interpersonal interactions, and routines and behaviors.   IMPAIRMENTS: are limiting patient from ADLs, IADLs, work, play, leisure, and social participation.   CO-MORBIDITIES: has co-morbidities such as Pervasive Developmental Delay Disorder  that affects occupational performance. Patient will benefit from skilled OT to address above impairments and improve overall function.  MODIFICATION OR ASSISTANCE TO COMPLETE EVALUATION: Min-Moderate modification of tasks or assist with assess necessary to complete an evaluation.  OT OCCUPATIONAL PROFILE AND HISTORY: Detailed assessment: Review of records and additional review of physical, cognitive, psychosocial history related to current functional performance.  CLINICAL DECISION MAKING: Moderate - several treatment options, min-mod task modification  necessary  REHAB POTENTIAL: Good  EVALUATION COMPLEXITY: Moderate    PLAN:  OT FREQUENCY: 2x/week  OT DURATION: 12 weeks  PLANNED INTERVENTIONS: 97535 self care/ADL training, 16109 therapeutic exercise, 97530 therapeutic activity, 97112 neuromuscular re-education, 97140 manual therapy, 97010 moist heat, 97034 contrast bath, balance training, patient/family education, and DME and/or AE instructions  RECOMMENDED OTHER SERVICES: PT & ST  CONSULTED AND AGREED WITH PLAN OF CARE: Patient and family member/caregiver  PLAN FOR NEXT SESSION: see above  Olegario Messier, MS, OTR/L   03/08/2023, 9:58 AM

## 2023-03-13 ENCOUNTER — Ambulatory Visit
Admission: RE | Admit: 2023-03-13 | Discharge: 2023-03-13 | Disposition: A | Discharge status: Home / Self Care | Payer: Medicare Other | Source: Ambulatory Visit | Attending: Family Medicine | Admitting: Family Medicine

## 2023-03-13 ENCOUNTER — Ambulatory Visit: Payer: Medicare Other | Attending: Student | Admitting: Occupational Therapy

## 2023-03-13 ENCOUNTER — Ambulatory Visit: Payer: Medicare Other

## 2023-03-13 DIAGNOSIS — R2681 Unsteadiness on feet: Secondary | ICD-10-CM | POA: Diagnosis not present

## 2023-03-13 DIAGNOSIS — R262 Difficulty in walking, not elsewhere classified: Secondary | ICD-10-CM

## 2023-03-13 DIAGNOSIS — R1313 Dysphagia, pharyngeal phase: Secondary | ICD-10-CM | POA: Insufficient documentation

## 2023-03-13 DIAGNOSIS — R053 Chronic cough: Secondary | ICD-10-CM | POA: Insufficient documentation

## 2023-03-13 DIAGNOSIS — M6281 Muscle weakness (generalized): Secondary | ICD-10-CM

## 2023-03-13 DIAGNOSIS — R278 Other lack of coordination: Secondary | ICD-10-CM | POA: Insufficient documentation

## 2023-03-13 DIAGNOSIS — R2689 Other abnormalities of gait and mobility: Secondary | ICD-10-CM | POA: Insufficient documentation

## 2023-03-13 NOTE — Progress Notes (Addendum)
 Modified Barium Swallow Study  Patient Details  Name: Frank Conley MRN: [id number] Date of Birth: 11-Mar-1960  Today's Date: 03/13/2023  Modified Barium Swallow completed.  Full report located under Chart Review in the Imaging Section.  History of Present Illness Pt is a 63 year old male with a past medical history of mental disability(Pervasive developmental disorder) who lives in a Group Home; also has a history of essential hypertension, seizure disorder, MGUS, sick sinus syndrome status post pacemaker, Covid.  Per chart note in 2020 when seen by ST services, it was indicated then that: Staff of his group home reported that he is an impulsive eater, that they give him finely chopped food and supervision..  Pt was seen for a MBSS in 2020 which revealed: MBS with mild dysphagia largely due to suspected osteophytes, which he compensated for with adequate airway protection..  Pt was seen for BSE in 2024 w/ similar presentation as in 2020: When allowed to self-feed consecutive straw sips of thin liquids, pt appears impulsive and coughed immediately after the swallow. No s/s of dysphagia noted with purees or solids. Provided education re: use of a Provale cup to control pt's rate. Trialed thin liquids via Provale cup with no further signs concerning for aspiration..  The recommendation in 12/2022 was for a Dysphagia level 3 diet w/ thin liquids via Dysphagia drink cup(for flow control); Supervision w/ po intake.  No further ST services as pt appeared at his Baseline then.   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 Suspect the impact of mild impedence of flow and clearance in this Cervical area directly impacts the oropharyngeal phases of swallowing. No aspiration was noted to occur; min laryngeal penetration occurred 1x w/ no f/u  occurence nor buildup of residue in the vestibule as trials continued.   Oral stage is characterized by adequate lip closure, bolus preparation and containment, and anterior to posterior transit. Mastication attention was [facility] but min slow/prolonged w/ the increased texture of the solid trial. Swallow initiation appeared to occur at the level of the valleculae for the majority of trials; posterior laryngeal surface of epiglottis w/ thin liquids.  Pharyngeal stage is noted for adequate tongue base retraction and laryngeal excursion w/ grossly functional hyoid exursion and functional pharyngeal constriction. Epiglottic deflection is [facility] w/ heavier consistencies; partial>complete w/ liquids. There was No aspiration noted during this study; min laryngeal penetration x1(w/ throat clear) w/ initial thin liquid trial but no further occurence noted thereafter and no buildup of any material in the vestubule noted. There was mild-mod valleculae>aeryepiglottic folds>pyriform sinus residue w/ all trial consistencies, greater with thicker and solid consistencies. This was much reduced by Dry swallowing b/t trials. Cueing pt to Dry swallow was fairly effective but TIME b/t trials for involuntary swallowing was most effective. Pharyngeal stripping wave appeared functional.  NOTED: Partial obstruction of flow through, and just below, the PE Segment noted - suspect impact from the impedence of bolus flow/clearance through and around the Cervical Osteophytes in the Cervical Esophagus. W/ TIME, f/u Dry swallows(sometimes Cued), the pharyngeal residue was much reduced. Bolus clearance through the viewable Esophagus appeared wfl.    Consistencies tested in setting of pt's BASELINE Dysphagia and Recommendations from prior MBSS were thin liquids x3 cup sips(no tsp, no straw), nectar liquids x2 cup sips(no tsp, no straw), honey x1 tsp, pudding x1 tsp, regular solid (1/2 graham cracker with pudding).  Recommend patient continue  previously recommended diet consistency of mech soft diet with thin liquids - CUP sips; moistened foods. Dry swallow/TIME b/t bites/sips; alternating solids and liquids. He would benefit from Supervision at meals/oral intake for verbal cues to support follow through w/ general precautions. Pt appears close to/at his Baseline per prior MBSS. Factors that may increase risk of adverse event in presence of aspiration Frank Conley 2021): Reduced cognitive function   Swallow Evaluation Recommendations Recommendations: PO diet PO Diet Recommendation: Dysphagia 3 (Mechanical soft);Thin liquids (Level 0) - well-moistened foods(extra condiments) in small bites, cut small Liquid Administration via: Cup; No straw Medication Administration: Whole meds with puree vs need to Crush in puree(per NSG) Supervision: Patient able to self-feed; Full supervision/cueing for follow through w/ swallowing strategies Swallowing strategies: Minimize environmental distractions; Slow rate of eating for dry swallowing(involuntarily) b/t bites; Small bites/sips; Multiple dry swallows after each bite/sip; Follow solids with liquids -- JUST TAKE TIME BETWEEN BITES AND SIPS TO CLEAR ANY RESIDUE! Postural changes: Position pt fully upright for meals; Stay upright 30-60 min after meals; Out of bed for meals Oral care recommendations: Oral care BID (2x/day); Oral care before PO; Staff/trained caregiver to provide oral care (support)        Comer Portugal, MS, CCC-SLP Speech Language Pathologist Rehab Services; [facility] [phone] (ascom) [name] 03/13/2023,6:05 PM

## 2023-03-13 NOTE — Therapy (Signed)
 OUTPATIENT PHYSICAL THERAPY NEURO TREATMENT     Patient Name: KVEON CASANAS MRN: [id number] DOB:January 01, 1961, 63 y.o., male Today's Date: 03/13/2023   PCP: Guido PARAS. Idella, MD REFERRING PROVIDER: Lang FORBES Darner, MD  END OF SESSION:  PT End of Session - 03/13/23 0850     Visit Number 12    Number of Visits 24    Date for PT Re-Evaluation 03/27/23    PT Start Time 0850    PT Stop Time 0929    PT Time Calculation (min) 39 min    Equipment Utilized During Treatment Gait belt    Activity Tolerance Patient tolerated treatment well    Behavior During Therapy WFL for tasks assessed/performed                 Past Medical History:  Diagnosis Date   Depression    Hypertension    MGUS (monoclonal gammopathy of unknown significance)    Pervasive developmental disorder    Seizures (HCC)    SSS (sick sinus syndrome) (HCC)    PPM placed   Past Surgical History:  Procedure Laterality Date   PACEMAKER IMPLANT     Patient Active Problem List   Diagnosis Date Noted   Vertebral artery stenosis 12/18/2022   Left-sided weakness 12/17/2022   MGUS (monoclonal gammopathy of unknown significance) 07/07/2021   Multifocal pneumonia 12/20/2018   Seizure disorder (HCC) 12/20/2018   COVID-19 virus infection 12/20/2018   Acute respiratory failure with hypoxia (HCC) 12/20/2018   Essential hypertension 12/20/2018   Hyponatremia 12/20/2018   Pancytopenia (HCC) 12/20/2018   Developmental delay disorder 12/20/2018   Pneumonia due to COVID-19 virus 12/20/2018   CKD (chronic kidney disease) stage 3, GFR 30-59 ml/min (HCC) 12/20/2018   Age-related nuclear cataract of both eyes 07/18/2017   Early dry stage nonexudative age-related macular degeneration of both eyes 07/18/2017   Myopia with astigmatism and presbyopia, bilateral 07/18/2017   Onychomycosis 06/29/2016   Risk for falls 02/10/2015   Pacemaker 11/06/2014   Septic shock (HCC) 11/06/2014   Dysarthria 10/31/2014   Altered mental  status 10/30/2014   Thrombocytopenia (HCC) 10/08/2014   Medication monitoring encounter 04/24/2014   Tremor 04/24/2014   Unsteadiness on feet 04/24/2014   History of nonmelanoma skin cancer 07/22/2013   Colon polyps 07/09/2013   Enuresis, primary, functional 05/09/2013   Gait disturbance 04/17/2013   Hemorrhoid 04/17/2013   Intention tremor 04/17/2013   Actinic keratosis 05/27/2010   Major depressive disorder, single episode, severe, with psychotic behavior (HCC) 03/21/2010   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   Pervasive developmental disorder 03/21/2010   Obsessive-compulsive disorder 03/07/2010   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   Obesity due to excess calories 02/25/2010    ONSET DATE: November 2024  REFERRING DIAG:  Diagnosis  G45.9 (ICD-10-CM) - TIA (transient ischemic attack)    THERAPY DIAG:  Muscle weakness (generalized)  Difficulty in walking, not elsewhere classified  Unsteadiness on feet  Rationale for Evaluation and Treatment: Rehabilitation  SUBJECTIVE:  SUBJECTIVE STATEMENT: Pt reports no pain. He reports no recent falls. He said he had regular check-up for his feet would like PT to check his feet.  Pt accompanied by:  self  PERTINENT HISTORY:  From eval  Pt is a 63 year old male presenting to physical therapy for issues concerning balance and strength. At baseline, pt has a history of intellectual disabilities and lives in a group home. Pt is accompanied by his caretaker where they report he had a slight stroke early November where he spent a couple of days at [facility] on the third floor. According to ED notes from his stay (11/9-11/11), pt was brought in after a syncopal episode where they found no evidence of stroke on the CT, but significant vertebral artery  stenosis. Prior to this recent admission, he was living in a group home and independent with most ADL's including getting himself dressed and bathing. Since then, he now requires assistance with basic ADL's and uses a two wheeled rolling walker for ambulation. He reports his left side is now weaker since his hospital stay. In the last six months, he has had two falls with both taking place in the bathroom. His caretaker reports one fall was when he was attempting to get out of the shower and hit his head on the sink. He has had two hospital admissions over the last year both related to falls. His typical day consists of going to school at the group home and watching television. He enjoys looking at antique shops in his free time and enjoys home improvement shows.   PMH: sick sinus syndrome, vertebral artery stenosis, pacemaker implant, I/DD, hypertension, and seizure disorder. Pt is referred to OPPT after recent increase in falls, syncope, concern for TIA. According to ED notes from his stay (11/9-11/11), pt was brought in after a syncopal episode where they found no evidence of stroke on the CT, but significant vertebral artery stenosis. While in the ED, pacemaker interrogated, ruled out. At baseline pt was living in a group home and independent with most ADL including getting himself dressed and bathing. Since then, he now requires assistance with basic ADL and uses a two wheeled rolling walker for ambulation. Several ED visits since October due to falls. Pt enjoys looking at antique shops in his free time and watching home improvement shows.    PAIN:  Are you having pain? No and pt reports achy feeling in the morning   PRECAUTIONS: Fall  WEIGHT BEARING RESTRICTIONS: No  FALLS: Has patient fallen in last 6 months? Yes. Number of falls 2 Pt's caretaker reports one fall when trying to get out of the shower where he hit his head on the sink.   LIVING ENVIRONMENT: Lives with: lives in an adult  home Lives in: Group home Stairs: No  Has following equipment at home: Vannie - 2 wheeled and shower chair rolling wl and shower chair   PLOF: Needs assistance with ADLs   PATIENT GOALS: work on stairs, walk like he used to  OBJECTIVE:  Note: Objective measures were completed at Evaluation unless otherwise noted.  DIAGNOSTIC FINDINGS:   MRI HEAD WITHOUT CONTRAST- 12/18/2022  COMPARISON:  Head CT December 16, 2022.   FINDINGS: Brain: Scattered foci no acute infarction, hemorrhage, hydrocephalus, extra-axial collection or mass lesion. Scattered foci T2 hyperintensity are seen within the white matter of the cerebral hemispheres, nonspecific. Mild parenchymal volume loss.   IMPRESSION: 1. No acute intracranial abnormality. 2. Scattered foci of T2 hyperintensity within the white  matter of the cerebral hemispheres, nonspecific but may represent mild chronic microvascular ischemic changes. 3. Mild parenchymal volume loss.    VITALS BP: 107/67 mmHg in seated HR: 70 bpm   COGNITION: Overall cognitive status: History of cognitive impairments - at baseline   SENSATION: Not tested  POSTURE: forward head and increased thoracic kyphosis   LOWER EXTREMITY MMT:    MMT Right Eval Left Eval  Hip flexion 4 4-  Hip extension    Hip abduction 4-** 4-**  Hip adduction 4* 4*  Hip internal rotation    Hip external rotation    Knee flexion 3+ 3  Knee extension 3+ 3  Ankle dorsiflexion 4 3+  Ankle plantarflexion    Ankle inversion    Ankle eversion    (Blank rows = not tested) **Hip abduction and adduction tested in seated  TRANSFERS: Assistive device utilized: None  Sit to stand: Complete Independence Stand to sit: Complete Independence Chair to chair: Complete Independence   STAIRS: Assess at next visit   GAIT: Gait pattern: two point step to with rolling walker, decreased stride length, Right foot flat, Left foot flat, trunk flexed, narrow BOS, poor foot clearance-  Right, and poor foot clearance- Left Distance walked: >500 ft Assistive device utilized: Environmental Consultant - 2 wheeled Level of assistance: SBA for safety  Comments: forward flexed trunk, increased adduction of LE, path deviation to the left, when completing turns pt lifts walker off the ground, narrow BOS, left lateral lean , genuvarus of right LE dynamic in mid stance , trendelenburg on right hip for ligamentous lateral stability in midstance, gait deviations dramatically more deviated after 4 minutes of ambulation resultant cross-over and catching of toes on contralateral heel in swing phase bilaterally   FUNCTIONAL TESTS:  5 times sit to stand: 29.3 with UE on thigh   Timed up and go (TUG): 27.96 with two wheeled walker, 34.21 without AD 6 minute walk test: 520 ft with rolling walker, increased fatigue  Berg Balance Scale:.36/56  PATIENT SURVEYS:  FOTO 42 - completed with assistance from SPT and caretaker   TODAY'S TREATMENT:                                                                                                                              DATE: 03/13/23  Self care/Home management: Integumentary assessment: Pt with skin ulcer R foot, head of first metatarsal, circular appearing, appears 1 inch diameter, no bleeding but red center, surrounding skin appears healing. PT does not have appropriate bandage for pt's ulcer in clinic. Pt reports to PT they do change his bandages at his care home. Pt being followed by physician for this.  Pt tries to pick at ulcer during session. PT provided instruction not to do this/reasoning. Pt verbalized understanding  PT provides education/instruction on importance of use of RW at all times for gait as pt asks if he can ambulate without.    TA: promote strength for transfers/gait  With 5# ankle weights donned: LAQ 1 x 12, 1x10 each LE cues for full ROM, use of TC Seated march use of bouncing ball as cue for knee lift 1x10, 1x15 each LE Seated LTL step onto  balance pod 16 alt LE  Seated heel raise 2x10 bilat Seated toe raise 2x10 bilat  Seated adductor squeeze with green pball 2x20 bilat LE Red tband hamstring curls 10x each LE - some difficulty with technique RTB seated hib abduction 5x each LE- difficulty with cuing/technique   Gait training: Ambulation with 2 WW x 148 ft cues, cues for obstacle navigation/proximity to RW   PATIENT EDUCATION: Education details: exercise technique Person educated: Patient and Caregiver   Education method: Explanation Education comprehension: verbalized understanding and needs further education  HOME EXERCISE PROGRAM: Access Code: [email or web address] URL: [email or web address] Date: 01/08/2023 Prepared by: Massie Dollar  Exercises - Standing March with Counter Support  - 1 x daily - 7 x weekly - 3 sets - 10 reps - 2 hold - Standing Hip Abduction with Counter Support  - 1 x daily - 7 x weekly - 3 sets - 10 reps - 3 hold - Sit to Stand with Arms Crossed  - 1 x daily - 7 x weekly - 3 sets - 10 reps  GOALS: Goals reviewed with patient? Yes  SHORT TERM GOALS: Target date: 02/13/2023  Patient will be independent in home exercise program to improve strength/mobility for better functional independence with ADLs. Baseline: To be provided next visit  Goal status: INITIAL  2.  N/A Baseline:  Goal status: INITIAL    LONG TERM GOALS: Target date: 03/27/23  Patient will increase FOTO score to equal to or greater than   71  to demonstrate statistically significant improvement in mobility and quality of life.  Baseline: 58 1/23: 54 (completed by pt with mental handicap)  Goal status: INITIAL  2.  Patient (> 46 years old) will complete five times sit to stand test in < 15 seconds indicating an increased LE strength and improved balance. Baseline: 29.33 1/23. 20.66sec  Goal status: in progress   3.  Patient will increase six minute walk test distance to >1000 for progression to community  ambulator and improve gait ability Baseline: 520 ft with walker 1/23: 755ft. With RW  Goal status: In progress   4.  Patient will reduce timed up and go to <15 seconds to reduce fall risk and demonstrate improved transfer/gait ability. Baseline: 27.96 with walker, 34.21 without AD 1/23: 23.59 with with RW 18.87 without AD Goal status: in progress   5.  Patient will increase Berg Balance score by > 6 points to demonstrate decreased fall risk during functional activities. Baseline: 36 1/23: 40 Goal status: in progress   6.  Patient will ascend/descend 5 stairs with least restrictive assistive device without loss of balance to improve functional mobility.   Baseline: Assess next visit  1/23: supervision assist with BUE support on bil rails.  Goal status: in progress  ASSESSMENT:  CLINICAL IMPRESSION: Session somewhat limited to more seated therex as pt reports he has been following up with physician for R foot ulcer. PT assesses in session, appears well healing, non-bleeding, but still present, and pt does not currently have covered with band-aid, only socks. Pt reported to PT he does have caregivers provide bandaging for his ulcer at home/they change it. Pt without pain during session and was able to ambulate short distance comfortably. The pt will continue to benefit from skilled physical therapy decrease  risk of falls, improve quality of life, and maximize function.     OBJECTIVE IMPAIRMENTS: Abnormal gait, decreased activity tolerance, decreased balance, decreased cognition, decreased coordination, decreased endurance, decreased knowledge of use of DME, decreased mobility, difficulty walking, decreased strength, decreased safety awareness, and improper body mechanics.   ACTIVITY LIMITATIONS: carrying, lifting, bending, standing, squatting, stairs, bathing, toileting, and dressing  PARTICIPATION LIMITATIONS: meal prep, cleaning, laundry, medication management, personal finances,  driving, shopping, community activity, occupation, and yard work  PERSONAL FACTORS: Behavior pattern and 3+ comorbidities: sick sinus syndrome, vertebral artery stenosis, pacemaker implant, developmental delay disorder, hypertension, and seizure disorder.  are also affecting patient's functional outcome.  REHAB POTENTIAL: Good  CLINICAL DECISION MAKING: Evolving/moderate complexity  EVALUATION COMPLEXITY: Moderate  PLAN:  PT FREQUENCY: 1-2x/week  PT DURATION: 12 weeks  PLANNED INTERVENTIONS: 97110-Therapeutic exercises, 97530- Therapeutic activity, 97112- Neuromuscular re-education, 97535- Self Care, [id number]- Manual therapy, [phone]- Gait training, [phone]- Orthotic Fit/training, [phone]- Electrical stimulation (unattended), Patient/Family education, Balance training, Stair training, Joint mobilization, Cryotherapy, and Moist heat  PLAN FOR NEXT SESSION:   Expand HEP,  Continue LE strengthening dynamic balance and gait without AD     Darryle JONELLE Patten, PT 03/13/2023, 12:10 PM   Physical Therapist - [facility]  12:10 PM 03/13/23

## 2023-03-13 NOTE — Therapy (Signed)
 Occupational Therapy Treatment Note   [name] MRN: [id number] DOB:May 23, 1960, 63 y.o., male Today's Date: 03/13/2023  PCP: Idella Guido PARAS, MD REFERRING PROVIDER: Zoraida Cadet, MD  END OF SESSION:  OT End of Session - 03/13/23 1157     Visit Number 18    Number of Visits 24    Date for OT Re-Evaluation 03/27/23    OT Start Time 0815    OT Stop Time 0845    OT Time Calculation (min) 30 min    Activity Tolerance Patient tolerated treatment well    Behavior During Therapy [facility] for tasks assessed/performed             Past Medical History:  Diagnosis Date   Depression    Hypertension    MGUS (monoclonal gammopathy of unknown significance)    Pervasive developmental disorder    Seizures (HCC)    SSS (sick sinus syndrome) (HCC)    PPM placed   Past Surgical History:  Procedure Laterality Date   PACEMAKER IMPLANT     Patient Active Problem List   Diagnosis Date Noted   Vertebral artery stenosis 12/18/2022   Left-sided weakness 12/17/2022   MGUS (monoclonal gammopathy of unknown significance) 07/07/2021   Multifocal pneumonia 12/20/2018   Seizure disorder (HCC) 12/20/2018   COVID-19 virus infection 12/20/2018   Acute respiratory failure with hypoxia (HCC) 12/20/2018   Essential hypertension 12/20/2018   Hyponatremia 12/20/2018   Pancytopenia (HCC) 12/20/2018   Developmental delay disorder 12/20/2018   Pneumonia due to COVID-19 virus 12/20/2018   CKD (chronic kidney disease) stage 3, GFR 30-59 ml/min (HCC) 12/20/2018   Age-related nuclear cataract of both eyes 07/18/2017   Early dry stage nonexudative age-related macular degeneration of both eyes 07/18/2017   Myopia with astigmatism and presbyopia, bilateral 07/18/2017   Onychomycosis 06/29/2016   Risk for falls 02/10/2015   Pacemaker 11/06/2014   Septic shock (HCC) 11/06/2014   Dysarthria 10/31/2014   Altered mental status 10/30/2014   Thrombocytopenia (HCC) 10/08/2014   Medication  monitoring encounter 04/24/2014   Tremor 04/24/2014   Unsteadiness on feet 04/24/2014   History of nonmelanoma skin cancer 07/22/2013   Colon polyps 07/09/2013   Enuresis, primary, functional 05/09/2013   Gait disturbance 04/17/2013   Hemorrhoid 04/17/2013   Intention tremor 04/17/2013   Actinic keratosis 05/27/2010   Major depressive disorder, single episode, severe, with psychotic behavior (HCC) 03/21/2010   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   Pervasive developmental disorder 03/21/2010   Obsessive-compulsive disorder 03/07/2010   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   Obesity due to excess calories 02/25/2010    ONSET DATE: 12/28/23  REFERRING DIAG: Left sided weakness  THERAPY DIAG:  Muscle weakness (generalized)  Rationale for Evaluation and Treatment: Rehabilitation  SUBJECTIVE:   SUBJECTIVE STATEMENT:  Pt. Was late for the therapy session this morning  Pt accompanied by:  Self  PERTINENT HISTORY:   Pt. is a 63 y.o. male who was admitted to the hospital with Left sided weakness, and left sided weakness, vertebral artery stenosis, sick sinus syndrome, Essential tremor, Pervasive Developmental Delay Disorder, Pacemaker implant s/p 6-7 years per caregiver. Pt. Resides at [facility], and attends [facility].   PRECAUTIONS: ICD/Pacemaker 6-7 years, Fall, Pureed diet, with sipping cup  WEIGHT BEARING RESTRICTIONS: No  PAIN:  Are you having pain? No  FALLS: Has patient fallen in last 6 months? Yes. Number of falls 2  LIVING ENVIRONMENT: Lives with: [facility] Home Lives in: No steps to enter  Has following equipment at home: Walker - 2 wheeled and shower chair  PLOF: Independent with basic ADLs  PATIENT GOALS:    To be able to walk like everyone else  OBJECTIVE:  Note: Objective measures were completed at Evaluation unless otherwise noted.  HAND DOMINANCE: Left  ADLs:  Transfers/ambulation related to ADLs: Eating:  Independent uses a sipping cup, pureed diet Grooming: Independent with brushing teeth, caregiver assist with shaving UB Dressing: MinA LB Dressing: Supervision Toileting: Caregiver assist Bathing: Caregiver assist Tub Shower transfers: MinA Equipment: none  IADLs: Shopping: N/A Light housekeeping:  Caregiver Assist now Meal Prep: Pureed/ Group Home prepares all meals Community mobility:  Relies on transportation services Medication management: Caregivers provide medication management Financial management: Caregivers Assist Handwriting: 50% legible  MOBILITY STATUS: Needs Assist: RW and Hx of falls  POSTURE COMMENTS:  Leans to the left in supported  sitting   ACTIVITY TOLERANCE: Activity tolerance: Limited  FUNCTIONAL OUTCOME MEASURES: FOTO: 73  UPPER EXTREMITY ROM:    Active ROM Right eval Right 02/13/23 Left eval Right 02/13/23  Shoulder flexion 74(100) 95(110) 80(122) 94(125)  Shoulder abduction 73(94) 84(98) 70(98) 90(110)  Shoulder adduction      Shoulder extension      Shoulder internal rotation      Shoulder external rotation      Elbow flexion [facility] WFL  Elbow extension [facility] WFL  Wrist flexion [facility] WFL  Wrist extension [facility] WFL  Wrist ulnar deviation      Wrist radial deviation      Wrist pronation      Wrist supination      (Blank rows = not tested)  UPPER EXTREMITY MMT:     MMT Right eval Right 02/13/23 Left eval Left 02/13/23  Shoulder flexion 3-/5 3/5 3-/5 3/5  Shoulder abduction 3-/5 3/5 3-/5 3/5  Shoulder adduction      Shoulder extension      Shoulder internal rotation      Shoulder external rotation      Middle trapezius      Lower trapezius      Elbow flexion 4/5 4/5 4/5 4/5  Elbow extension 4-/5 4/5 4-/5 4/5  Wrist flexion 4-/5 4/5 4-/5 4/5  Wrist extension 4-/5 4/5 4-/5 4/5  Wrist ulnar deviation      Wrist radial deviation      Wrist pronation      Wrist supination      (Blank rows = not  tested)  HAND FUNCTION: Grip strength: Right: 38 lbs; Left: 27 lbs, Lateral pinch: Right: 13 lbs, Left: 14 lbs, and 3 point pinch: Right: 16 lbs, Left: 13 lbs  02/13/2023:  Grip strength: Right: 30 lbs; Left: 27 lbs, Lateral pinch: Right: 16 lbs, Left: 14 lbs, and 3 point pinch: Right: 18 lbs, Left: 14 lbs   COORDINATION: 9 Hole Peg test: Right: 55 sec; Left: 47 sec  02/13/2023:  9 Hole Peg test: Right: 1 min.; Left: 1 min. & 6 sec  SENSATION: Light Touch: TBD Proprioception: TBD  EDEMA: N/A  MUSCLE TONE: Ataxia  COGNITION: Overall cognitive status: Within functional limits for tasks assessed   Caregiver reports becomes agitated more easily    VISION ASSESSMENT:   Glasses  PERCEPTION: Intact  PRAXIS: Impaired: Motor planning   TODAY'S TREATMENT:  DATE: 2.05/2023   Therapeutic Ex:  Pt. worked on the [employer] for 8 min. on level 4.0. with constant monitoring of the BUEs. Pt. worked on changing, and alternating forward reverse position. Pt. performed gross gripping with a gross grip strengthener. Pt. worked on sustaining grip while grasping pegs and reaching at various heights. The gripper was set to 17.9# of grip strength resistance for 5 pegs, and modified to 11.2#. Left Lateral, and 3pt. Pinch strengthening using yellow, red, green, and blue, and black level resistive clips  PATIENT EDUCATION: Education details:  UE strengthening, FMC Person educated: Patient and Caregiver Administrator: Explanation, Demonstration, and Verbal cues Education comprehension: verbalized understanding, returned demonstration, verbal cues required, and needs further education  HOME EXERCISE PROGRAM:   To continue to assess, and provide as needed   GOALS: Goals reviewed with patient? Yes  SHORT TERM GOALS: Target date: 02/13/2023    Pt. Will require  supervision with HEPs for BE UEs. Baseline: 02/13/23:  Eval: No current HEP Goal status: INITIAL  LONG TERM GOALS: Target date: 03/27/2023    Pt. Will assume upright sitting with minimal cues for the duration of a 15 min. Task.  Baseline: 02/13/2023: Pt. Is improving with upright midline sitting, and presents with less left sided leaning. Eval: Pt. with positive left sided leaning. Goal status:  Progressing/Ongoing   2.  Pt. Will increase bilateral shoulder flexion ROM by 10 degrees to assist with bathing/washing his hair Baseline: 02/13/2023: shoulder flexion: Right: 95(110) Left: 94(125) Eval: shoulder flexion: Right: 74(100) Left: 19(877)  Goal status: Progressing/Ongoing  3.  Pt. Will improve bilateral shoulder abduction ROM by 10 degrees to assist with UE dressing skills. Baseline: 02/13/2023: shoulder abduction: Right: 84(95), Left: 90(110) Eval: shoulder abduction: Right: 73(94), Left: 70(98) Goal status: INITIAL  4.  Pt. Will improve left grip strength by 5# to be able to hold items securely Baseline: 02/13/2023: Right: 30# Left: 27# Eval: Right: 38# Left: 27# Goal status:  Ongoing  5.  Pt. Will improve [facility] skills by 10 sec to be able to independently, and efficiently manipulate small items. Baseline: 02/13/23: Left 1 min. & 6 sec. Eval: Left 55 sec Goal status Ongoing  6.  Pt. will increase FOTO score by 2 points to reflect Pt. perceived improvement with assessment specific ADL/IADL's.  Baseline: 02/13/23: 57 Eval: 73 Goal status: Ongoing  ASSESSMENT:  CLINICAL IMPRESSION:  Pt. was able to complete UE reciprocal motion on the SciFit at level at 4.0 for 8 min. rest breaks were required. Pt. required cues initially for vertical placement, and positioning of the grip strengthener in his hand. Pt. required fewer cues for grip position in the hand, and  was able to hold, and sustain the gripper in a vertical position in preparation for moving the pegs. The grip strength resistance was  modified from 17.9# to 11.2# after 5 pegs. Pt. required cues for lateral, and 3pt. hand position on resistive clips. Pt. continues to benefit from OT services for ADL training, A/E training, UE there. Ex., neuromuscular re-education, and Pt./caregiver education.  PERFORMANCE DEFICITS: in functional skills including ADLs, IADLs, coordination, dexterity, ROM, strength, Fine motor control, Gross motor control, balance, and UE functional use, cognitive skills including problem solving and safety awareness, and psychosocial skills including coping strategies, environmental adaptation, interpersonal interactions, and routines and behaviors.   IMPAIRMENTS: are limiting patient from ADLs, IADLs, work, play, leisure, and social participation.   CO-MORBIDITIES: has co-morbidities such as Pervasive Developmental Delay Disorder  that affects occupational  performance. Patient will benefit from skilled OT to address above impairments and improve overall function.  MODIFICATION OR ASSISTANCE TO COMPLETE EVALUATION: Min-Moderate modification of tasks or assist with assess necessary to complete an evaluation.  OT OCCUPATIONAL PROFILE AND HISTORY: Detailed assessment: Review of records and additional review of physical, cognitive, psychosocial history related to current functional performance.  CLINICAL DECISION MAKING: Moderate - several treatment options, min-mod task modification necessary  REHAB POTENTIAL: Good  EVALUATION COMPLEXITY: Moderate    PLAN:  OT FREQUENCY: 2x/week  OT DURATION: 12 weeks  PLANNED INTERVENTIONS: 97535 self care/ADL training, 02889 therapeutic exercise, 97530 therapeutic activity, 97112 neuromuscular re-education, 97140 manual therapy, 97010 moist heat, 97034 contrast bath, balance training, patient/family education, and DME and/or AE instructions  RECOMMENDED OTHER SERVICES: PT & ST  CONSULTED AND AGREED WITH PLAN OF CARE: Patient and family member/caregiver  PLAN FOR NEXT  SESSION: see above  Richardson Otter, MS, OTR/L   03/13/2023, 11:59 AM

## 2023-03-15 ENCOUNTER — Ambulatory Visit: Payer: Medicare Other | Admitting: Occupational Therapy

## 2023-03-15 DIAGNOSIS — M6281 Muscle weakness (generalized): Secondary | ICD-10-CM | POA: Diagnosis not present

## 2023-03-15 DIAGNOSIS — R278 Other lack of coordination: Secondary | ICD-10-CM

## 2023-03-15 NOTE — Therapy (Signed)
 Occupational Therapy Treatment Note   [name] MRN: [id number] DOB:Mar 08, 1960, 63 y.o., male Today's Date: 03/15/2023  PCP: Idella Guido PARAS, MD REFERRING PROVIDER: Zoraida Cadet, MD  END OF SESSION:  OT End of Session - 03/15/23 9177     Visit Number 19    Number of Visits 24    Date for OT Re-Evaluation 03/27/23    OT Start Time 0805    OT Stop Time 0845    OT Time Calculation (min) 40 min    Activity Tolerance Patient tolerated treatment well    Behavior During Therapy [facility] for tasks assessed/performed             Past Medical History:  Diagnosis Date   Depression    Hypertension    MGUS (monoclonal gammopathy of unknown significance)    Pervasive developmental disorder    Seizures (HCC)    SSS (sick sinus syndrome) (HCC)    PPM placed   Past Surgical History:  Procedure Laterality Date   PACEMAKER IMPLANT     Patient Active Problem List   Diagnosis Date Noted   Vertebral artery stenosis 12/18/2022   Left-sided weakness 12/17/2022   MGUS (monoclonal gammopathy of unknown significance) 07/07/2021   Multifocal pneumonia 12/20/2018   Seizure disorder (HCC) 12/20/2018   COVID-19 virus infection 12/20/2018   Acute respiratory failure with hypoxia (HCC) 12/20/2018   Essential hypertension 12/20/2018   Hyponatremia 12/20/2018   Pancytopenia (HCC) 12/20/2018   Developmental delay disorder 12/20/2018   Pneumonia due to COVID-19 virus 12/20/2018   CKD (chronic kidney disease) stage 3, GFR 30-59 ml/min (HCC) 12/20/2018   Age-related nuclear cataract of both eyes 07/18/2017   Early dry stage nonexudative age-related macular degeneration of both eyes 07/18/2017   Myopia with astigmatism and presbyopia, bilateral 07/18/2017   Onychomycosis 06/29/2016   Risk for falls 02/10/2015   Pacemaker 11/06/2014   Septic shock (HCC) 11/06/2014   Dysarthria 10/31/2014   Altered mental status 10/30/2014   Thrombocytopenia (HCC) 10/08/2014   Medication  monitoring encounter 04/24/2014   Tremor 04/24/2014   Unsteadiness on feet 04/24/2014   History of nonmelanoma skin cancer 07/22/2013   Colon polyps 07/09/2013   Enuresis, primary, functional 05/09/2013   Gait disturbance 04/17/2013   Hemorrhoid 04/17/2013   Intention tremor 04/17/2013   Actinic keratosis 05/27/2010   Major depressive disorder, single episode, severe, with psychotic behavior (HCC) 03/21/2010   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   Pervasive developmental disorder 03/21/2010   Obsessive-compulsive disorder 03/07/2010   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   Obesity due to excess calories 02/25/2010    ONSET DATE: 12/28/23  REFERRING DIAG: Left sided weakness  THERAPY DIAG:  Muscle weakness (generalized)  Other lack of coordination  Rationale for Evaluation and Treatment: Rehabilitation  SUBJECTIVE:   SUBJECTIVE STATEMENT:  Pt. reports that he had to rush to get here this morning. Pt accompanied by:  Self  PERTINENT HISTORY:   Pt. is a 63 y.o. male who was admitted to the hospital with Left sided weakness, and left sided weakness, vertebral artery stenosis, sick sinus syndrome, Essential tremor, Pervasive Developmental Delay Disorder, Pacemaker implant s/p 6-7 years per caregiver. Pt. Resides at [facility], and attends [facility].   PRECAUTIONS: ICD/Pacemaker 6-7 years, Fall, Pureed diet, with sipping cup  WEIGHT BEARING RESTRICTIONS: No  PAIN:  Are you having pain? No  FALLS: Has patient fallen in last 6 months? Yes. Number of falls 2  LIVING ENVIRONMENT: Lives with: [facility]  Home Lives in: No steps to enter Has following equipment at home: Walker - 2 wheeled and shower chair  PLOF: Independent with basic ADLs  PATIENT GOALS:    To be able to walk like everyone else  OBJECTIVE:  Note: Objective measures were completed at Evaluation unless otherwise noted.  HAND DOMINANCE:  Left  ADLs:  Transfers/ambulation related to ADLs: Eating: Independent uses a sipping cup, pureed diet Grooming: Independent with brushing teeth, caregiver assist with shaving UB Dressing: MinA LB Dressing: Supervision Toileting: Caregiver assist Bathing: Caregiver assist Tub Shower transfers: MinA Equipment: none  IADLs: Shopping: N/A Light housekeeping:  Caregiver Assist now Meal Prep: Pureed/ Group Home prepares all meals Community mobility:  Relies on transportation services Medication management: Caregivers provide medication management Financial management: Caregivers Assist Handwriting: 50% legible  MOBILITY STATUS: Needs Assist: RW and Hx of falls  POSTURE COMMENTS:  Leans to the left in supported  sitting   ACTIVITY TOLERANCE: Activity tolerance: Limited  FUNCTIONAL OUTCOME MEASURES: FOTO: 73  UPPER EXTREMITY ROM:    Active ROM Right eval Right 02/13/23 Left eval Right 02/13/23  Shoulder flexion 74(100) 95(110) 80(122) 94(125)  Shoulder abduction 73(94) 84(98) 70(98) 90(110)  Shoulder adduction      Shoulder extension      Shoulder internal rotation      Shoulder external rotation      Elbow flexion [facility] WFL  Elbow extension [facility] WFL  Wrist flexion [facility] WFL  Wrist extension [facility] WFL  Wrist ulnar deviation      Wrist radial deviation      Wrist pronation      Wrist supination      (Blank rows = not tested)  UPPER EXTREMITY MMT:     MMT Right eval Right 02/13/23 Left eval Left 02/13/23  Shoulder flexion 3-/5 3/5 3-/5 3/5  Shoulder abduction 3-/5 3/5 3-/5 3/5  Shoulder adduction      Shoulder extension      Shoulder internal rotation      Shoulder external rotation      Middle trapezius      Lower trapezius      Elbow flexion 4/5 4/5 4/5 4/5  Elbow extension 4-/5 4/5 4-/5 4/5  Wrist flexion 4-/5 4/5 4-/5 4/5  Wrist extension 4-/5 4/5 4-/5 4/5  Wrist ulnar deviation      Wrist radial deviation      Wrist  pronation      Wrist supination      (Blank rows = not tested)  HAND FUNCTION: Grip strength: Right: 38 lbs; Left: 27 lbs, Lateral pinch: Right: 13 lbs, Left: 14 lbs, and 3 point pinch: Right: 16 lbs, Left: 13 lbs  02/13/2023:  Grip strength: Right: 30 lbs; Left: 27 lbs, Lateral pinch: Right: 16 lbs, Left: 14 lbs, and 3 point pinch: Right: 18 lbs, Left: 14 lbs   COORDINATION: 9 Hole Peg test: Right: 55 sec; Left: 47 sec  02/13/2023:  9 Hole Peg test: Right: 1 min.; Left: 1 min. & 6 sec  SENSATION: Light Touch: TBD Proprioception: TBD  EDEMA: N/A  MUSCLE TONE: Ataxia  COGNITION: Overall cognitive status: Within functional limits for tasks assessed   Caregiver reports becomes agitated more easily    VISION ASSESSMENT:   Glasses  PERCEPTION: Intact  PRAXIS: Impaired: Motor planning   TODAY'S TREATMENT:  DATE: 03/15/2023   Therapeutic Ex:  -UE strengthening using the SciFit for 8 min. on level 4.0. with constant monitoring of the BUEs. Pt. performed gross gripping with a gross grip strengthener. Pt. worked on sustaining grip while grasping pegs and reaching at various heights. The gripper was set to 17.9# of grip strength resistance to clear 100% of the pegs.   Neuromuscular re-education:  -facilitated left hand [facility] skills grasping 1 flat discs, and stacking them.  Therapeutic activities:  -facilitated LUE forward functional reach with a 1# cuff weight using a [employer] game placed at progressively higher elevated surfaces.  -LUE diagonal reaching patterns moving objects from a  lower surface on the right to crossing midline to placing them at an elevated surface on the left with 1# cuff weight.    PATIENT EDUCATION: Education details:  UE strengthening, FMC Person educated: Patient and Child Psychotherapist: Explanation,  Demonstration, and Verbal cues Education comprehension: verbalized understanding, returned demonstration, verbal cues required, and needs further education  HOME EXERCISE PROGRAM:   To continue to assess, and provide as needed   GOALS: Goals reviewed with patient? Yes  SHORT TERM GOALS: Target date: 02/13/2023    Pt. Will require supervision with HEPs for BE UEs. Baseline: 02/13/23:  Eval: No current HEP Goal status: INITIAL  LONG TERM GOALS: Target date: 03/27/2023    Pt. Will assume upright sitting with minimal cues for the duration of a 15 min. Task.  Baseline: 02/13/2023: Pt. Is improving with upright midline sitting, and presents with less left sided leaning. Eval: Pt. with positive left sided leaning. Goal status:  Progressing/Ongoing   2.  Pt. Will increase bilateral shoulder flexion ROM by 10 degrees to assist with bathing/washing his hair Baseline: 02/13/2023: shoulder flexion: Right: 95(110) Left: 94(125) Eval: shoulder flexion: Right: 74(100) Left: 19(877)  Goal status: Progressing/Ongoing  3.  Pt. Will improve bilateral shoulder abduction ROM by 10 degrees to assist with UE dressing skills. Baseline: 02/13/2023: shoulder abduction: Right: 84(95), Left: 90(110) Eval: shoulder abduction: Right: 73(94), Left: 70(98) Goal status: INITIAL  4.  Pt. Will improve left grip strength by 5# to be able to hold items securely Baseline: 02/13/2023: Right: 30# Left: 27# Eval: Right: 38# Left: 27# Goal status:  Ongoing  5.  Pt. Will improve [facility] skills by 10 sec to be able to independently, and efficiently manipulate small items. Baseline: 02/13/23: Left 1 min. & 6 sec. Eval: Left 55 sec Goal status Ongoing  6.  Pt. will increase FOTO score by 2 points to reflect Pt. perceived improvement with assessment specific ADL/IADL's.  Baseline: 02/13/23: 57 Eval: 73 Goal status: Ongoing  ASSESSMENT:  CLINICAL IMPRESSION:  Pt. reports that he did not sleep good last night. Pt. tolerated the  UE exercises well today, requiring cues for task initiation. Pt. required cues, and assist for functional reaching tasks through various planes using a 1# wrist weight. Plan to take measurements, and perform progress update at the next visit.  Pt. continues to benefit from OT services for ADL training, A/E training, UE there. Ex., neuromuscular re-education, and Pt./caregiver education.  PERFORMANCE DEFICITS: in functional skills including ADLs, IADLs, coordination, dexterity, ROM, strength, Fine motor control, Gross motor control, balance, and UE functional use, cognitive skills including problem solving and safety awareness, and psychosocial skills including coping strategies, environmental adaptation, interpersonal interactions, and routines and behaviors.   IMPAIRMENTS: are limiting patient from ADLs, IADLs, work, play, leisure, and social participation.   CO-MORBIDITIES: has co-morbidities such as  Pervasive Developmental Delay Disorder  that affects occupational performance. Patient will benefit from skilled OT to address above impairments and improve overall function.  MODIFICATION OR ASSISTANCE TO COMPLETE EVALUATION: Min-Moderate modification of tasks or assist with assess necessary to complete an evaluation.  OT OCCUPATIONAL PROFILE AND HISTORY: Detailed assessment: Review of records and additional review of physical, cognitive, psychosocial history related to current functional performance.  CLINICAL DECISION MAKING: Moderate - several treatment options, min-mod task modification necessary  REHAB POTENTIAL: Good  EVALUATION COMPLEXITY: Moderate    PLAN:  OT FREQUENCY: 2x/week  OT DURATION: 12 weeks  PLANNED INTERVENTIONS: 97535 self care/ADL training, 02889 therapeutic exercise, 97530 therapeutic activity, 97112 neuromuscular re-education, 97140 manual therapy, 97010 moist heat, 97034 contrast bath, balance training, patient/family education, and DME and/or AE  instructions  RECOMMENDED OTHER SERVICES: PT & ST  CONSULTED AND AGREED WITH PLAN OF CARE: Patient and family member/caregiver  PLAN FOR NEXT SESSION: see above  Richardson Otter, MS, OTR/L   03/15/2023, 8:57 AM

## 2023-03-20 ENCOUNTER — Ambulatory Visit: Payer: Medicare Other

## 2023-03-20 ENCOUNTER — Ambulatory Visit: Payer: Medicare Other | Admitting: Physical Therapy

## 2023-03-20 DIAGNOSIS — R278 Other lack of coordination: Secondary | ICD-10-CM

## 2023-03-20 DIAGNOSIS — M6281 Muscle weakness (generalized): Secondary | ICD-10-CM

## 2023-03-20 DIAGNOSIS — R262 Difficulty in walking, not elsewhere classified: Secondary | ICD-10-CM

## 2023-03-20 DIAGNOSIS — R2689 Other abnormalities of gait and mobility: Secondary | ICD-10-CM

## 2023-03-20 DIAGNOSIS — R2681 Unsteadiness on feet: Secondary | ICD-10-CM

## 2023-03-20 NOTE — Therapy (Signed)
OUTPATIENT PHYSICAL THERAPY NEURO TREATMENT     [name] MRN: [id number] DOB:[birth year], 63 y.o., male Today's Date: 03/20/2023   PCP: Domenic Schwab. Frank Chen, MD REFERRING PROVIDER: Rodney Cruise, MD  END OF SESSION:  PT End of Session - 03/20/23 0818     Visit Number 13    Number of Visits 24    Date for PT Re-Evaluation 03/27/23    PT Start Time 0812    PT Stop Time 0845    PT Time Calculation (min) 33 min    Equipment Utilized During Treatment Gait belt    Activity Tolerance Patient tolerated treatment well    Behavior During Therapy WFL for tasks assessed/performed                 Past Medical History:  Diagnosis Date   Depression    Hypertension    MGUS (monoclonal gammopathy of unknown significance)    Pervasive developmental disorder    Seizures (HCC)    SSS (sick sinus syndrome) (HCC)    PPM placed   Past Surgical History:  Procedure Laterality Date   PACEMAKER IMPLANT     Patient Active Problem List   Diagnosis Date Noted   Vertebral artery stenosis 12/18/2022   Left-sided weakness 12/17/2022   MGUS (monoclonal gammopathy of unknown significance) 07/07/2021   Multifocal pneumonia 12/20/2018   Seizure disorder (HCC) 12/20/2018   COVID-19 virus infection 12/20/2018   Acute respiratory failure with hypoxia (HCC) 12/20/2018   Essential hypertension 12/20/2018   Hyponatremia 12/20/2018   Pancytopenia (HCC) 12/20/2018   Developmental delay disorder 12/20/2018   Pneumonia due to COVID-19 virus 12/20/2018   CKD (chronic kidney disease) stage 3, GFR 30-59 ml/min (HCC) 12/20/2018   Age-related nuclear cataract of both eyes 07/18/2017   Early dry stage nonexudative age-related macular degeneration of both eyes 07/18/2017   Myopia with astigmatism and presbyopia, bilateral 07/18/2017   Onychomycosis 06/29/2016   Risk for falls 02/10/2015   Pacemaker 11/06/2014   Septic shock (HCC) 11/06/2014   Dysarthria 10/31/2014   Altered mental  status 10/30/2014   Thrombocytopenia (HCC) 10/08/2014   Medication monitoring encounter 04/24/2014   Tremor 04/24/2014   Unsteadiness on feet 04/24/2014   History of nonmelanoma skin cancer 07/22/2013   Colon polyps 07/09/2013   Enuresis, primary, functional 05/09/2013   Gait disturbance 04/17/2013   Hemorrhoid 04/17/2013   Intention tremor 04/17/2013   Actinic keratosis 05/27/2010   Major depressive disorder, single episode, severe, with psychotic behavior (HCC) 03/21/2010   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   Pervasive developmental disorder 03/21/2010   Obsessive-compulsive disorder 03/07/2010   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   Obesity due to excess calories 02/25/2010    ONSET DATE: November 2024  REFERRING DIAG:  Diagnosis  G45.9 (ICD-10-CM) - TIA (transient ischemic attack)    THERAPY DIAG:  Muscle weakness (generalized)  Other lack of coordination  Difficulty in walking, not elsewhere classified  Unsteadiness on feet  Other abnormalities of gait and mobility  Rationale for Evaluation and Treatment: Rehabilitation  SUBJECTIVE:  SUBJECTIVE STATEMENT: Pt report to PT late for scheduled treatment. States that transport had to pick up additional people, causing him to be late. Also reports that the wound on foot is getting better. Wound Was not visualized by this PT. No other medical updates.  Pt reports that he is not using the RW much at home anymore.   Pt accompanied by:  self  PERTINENT HISTORY:  From eval  Pt is a 63 year old male presenting to physical therapy for issues concerning balance and strength. At baseline, pt has a history of intellectual disabilities and lives in a group home. Pt is accompanied by his caretaker where they report he had a "slight stroke"  early November where he spent a couple of days at [facility] on the third floor. According to ED notes from his stay (11/9-11/11), pt was brought in after a syncopal episode where they found no evidence of stroke on the CT, but significant vertebral artery stenosis. Prior to this recent admission, he was living in a group home and independent with most ADL's including getting himself dressed and bathing. Since then, he now requires assistance with basic ADL's and uses a two wheeled rolling walker for ambulation. He reports his left side is now "weaker" since his hospital stay. In the last six months, he has had two falls with both taking place in the bathroom. His caretaker reports one fall was when he was attempting to get out of the shower and hit his head on the sink. He has had two hospital admissions over the last year both related to falls. His typical day consists of going to school at the group home and watching television. He enjoys looking at antique shops in his free time and enjoys home improvement shows.   PMH: sick sinus syndrome, vertebral artery stenosis, pacemaker implant, I/DD, hypertension, and seizure disorder. Pt is referred to OPPT after recent increase in falls, syncope, concern for TIA. According to ED notes from his stay (11/9-11/11), pt was brought in after a syncopal episode where they found no evidence of stroke on the CT, but significant vertebral artery stenosis. While in the ED, pacemaker interrogated, ruled out. At baseline pt was living in a group home and independent with most ADL including getting himself dressed and bathing. Since then, he now requires assistance with basic ADL and uses a two wheeled rolling walker for ambulation. Several ED visits since October due to falls. Pt enjoys looking at antique shops in his free time and watching home improvement shows.    PAIN:  Are you having pain? No and pt reports achy feeling in the morning   PRECAUTIONS: Fall  WEIGHT BEARING  RESTRICTIONS: No  FALLS: Has patient fallen in last 6 months? Yes. Number of falls 2 Pt's caretaker reports one fall when trying to get out of the shower where he hit his head on the sink.   LIVING ENVIRONMENT: Lives with: lives in an adult home Lives in: Group home Stairs: No  Has following equipment at home: Dan Humphreys - 2 wheeled and shower chair rolling wl and shower chair   PLOF: Needs assistance with ADLs   PATIENT GOALS: work on stairs, walk like he used to  OBJECTIVE:  Note: Objective measures were completed at Evaluation unless otherwise noted.  DIAGNOSTIC FINDINGS:   MRI HEAD WITHOUT CONTRAST- 12/18/2022  COMPARISON:  Head CT December 16, 2022.   FINDINGS: Brain: Scattered foci no acute infarction, hemorrhage, hydrocephalus, extra-axial collection or mass lesion. Scattered foci T2  hyperintensity are seen within the white matter of the cerebral hemispheres, nonspecific. Mild parenchymal volume loss.   IMPRESSION: 1. No acute intracranial abnormality. 2. Scattered foci of T2 hyperintensity within the white matter of the cerebral hemispheres, nonspecific but may represent mild chronic microvascular ischemic changes. 3. Mild parenchymal volume loss.    VITALS BP: 107/67 mmHg in seated HR: 70 bpm   COGNITION: Overall cognitive status: History of cognitive impairments - at baseline   SENSATION: Not tested  POSTURE: forward head and increased thoracic kyphosis   LOWER EXTREMITY MMT:    MMT Right Eval Left Eval  Hip flexion 4 4-  Hip extension    Hip abduction 4-** 4-**  Hip adduction 4* 4*  Hip internal rotation    Hip external rotation    Knee flexion 3+ 3  Knee extension 3+ 3  Ankle dorsiflexion 4 3+  Ankle plantarflexion    Ankle inversion    Ankle eversion    (Blank rows = not tested) **Hip abduction and adduction tested in seated  TRANSFERS: Assistive device utilized: None  Sit to stand: Complete Independence Stand to sit: Complete  Independence Chair to chair: Complete Independence   STAIRS: Assess at next visit   GAIT: Gait pattern: two point step to with rolling walker, decreased stride length, Right foot flat, Left foot flat, trunk flexed, narrow BOS, poor foot clearance- Right, and poor foot clearance- Left Distance walked: >500 ft Assistive device utilized: Environmental consultant - 2 wheeled Level of assistance: SBA for safety  Comments: forward flexed trunk, increased adduction of LE, path deviation to the left, when completing turns pt lifts walker off the ground, narrow BOS, left lateral lean , genuvarus of right LE dynamic in mid stance , trendelenburg on right hip for ligamentous lateral stability in midstance, gait deviations dramatically more deviated after 4 minutes of ambulation resultant cross-over and catching of toes on contralateral heel in swing phase bilaterally   FUNCTIONAL TESTS:  5 times sit to stand: 29.3 with UE on thigh   Timed up and go (TUG): 27.96 with two wheeled walker, 34.21 without AD 6 minute walk test: 520 ft with rolling walker, increased fatigue  Berg Balance Scale:.36/56  PATIENT SURVEYS:  FOTO 26 - completed with assistance from SPT and caretaker   TODAY'S TREATMENT:                                                                                                                              DATE: 03/20/23   Gait with RW x 262ft with supervision assist. Noted to have flexed posture and increasingly poor position of RW with fatigue.   Gait without AD x 128ft CGA for safety. No LOB  Forward reverse gait with no AD 7ft x 3  Side stepping R and L at rail. 31ft x 5 bil cues for reduced trunk rotation Standing HS curl x 10 bil  LUE overhead reach standing at rail x 10  Figure 8  walking at 6 ft x 4 laps  Mini squat with UE support x 10   Throughout session, PT provided CGA for all standing activities for improved safety and reduced fall risk.    PATIENT EDUCATION: Education details:  exercise technique Person educated: Patient and Caregiver   Education method: Explanation Education comprehension: verbalized understanding and needs further education  HOME EXERCISE PROGRAM: Access Code: [email or web address] URL: [email or web address] Date: 03/20/2023 Prepared by: Grier Rocher  Exercises - Standing March with Counter Support  - 1 x daily - 7 x weekly - 3 sets - 10 reps - 2 hold - Standing Hip Abduction with Counter Support  - 1 x daily - 7 x weekly - 3 sets - 10 reps - 3 hold - Sit to Stand with Arms Crossed  - 1 x daily - 7 x weekly - 3 sets - 10 reps - Standing Knee Flexion with Counter Support  - 1 x daily - 7 x weekly - 3 sets - 10 reps - Side Stepping with Counter Support  - 1 x daily - 7 x weekly - 3 sets - 10 reps - Backward Walking with Counter Support  - 1 x daily - 7 x weekly - 3 sets - 10 reps - Figure-8 Walking Around [place]  - 1 x daily - 7 x weekly - 3 sets - 10 reps - Mini Squat with Counter Support  - 1 x daily - 7 x weekly - 3 sets - 10 reps  GOALS: Goals reviewed with patient? Yes  SHORT TERM GOALS: Target date: 02/13/2023  Patient will be independent in home exercise program to improve strength/mobility for better functional independence with ADLs. Baseline: To be provided next visit  Goal status: INITIAL  2.  N/A Baseline:  Goal status: INITIAL    LONG TERM GOALS: Target date: 03/27/23  Patient will increase FOTO score to equal to or greater than   71  to demonstrate statistically significant improvement in mobility and quality of life.  Baseline: 58 1/23: 54 (completed by pt with mental handicap)  Goal status: INITIAL  2.  Patient (> 33 years old) will complete five times sit to stand test in < 15 seconds indicating an increased LE strength and improved balance. Baseline: 29.33 1/23. 20.66sec  Goal status: in progress   3.  Patient will increase six minute walk test distance to >1000 for progression to community ambulator and  improve gait ability Baseline: 520 ft with walker 1/23: 735ft. With RW  Goal status: In progress   4.  Patient will reduce timed up and go to <15 seconds to reduce fall risk and demonstrate improved transfer/gait ability. Baseline: 27.96 with walker, 34.21 without AD 1/23: 23.59 with with RW 18.87 without AD Goal status: in progress   5.  Patient will increase Berg Balance score by > 6 points to demonstrate decreased fall risk during functional activities. Baseline: 36 1/23: 40 Goal status: in progress   6.  Patient will ascend/descend 5 stairs with least restrictive assistive device without loss of balance to improve functional mobility.   Baseline: Assess next visit  1/23: supervision assist with BUE support on bil rails.  Goal status: in progress  ASSESSMENT:  CLINICAL IMPRESSION: Session slightly limited by late arrival. Pt reports that wound on foot is healing well. Not seen by this PT. PT treatment focused on gait without AD to improve safety with mobility in home, given that pt reports that he is not using the RW consistently at home anymore.  HEP expanded to including dynamic gait training with reduced UE support.  The pt will continue to benefit from skilled physical therapy decrease risk of falls, improve quality of life, and maximize function.     OBJECTIVE IMPAIRMENTS: Abnormal gait, decreased activity tolerance, decreased balance, decreased cognition, decreased coordination, decreased endurance, decreased knowledge of use of DME, decreased mobility, difficulty walking, decreased strength, decreased safety awareness, and improper body mechanics.   ACTIVITY LIMITATIONS: carrying, lifting, bending, standing, squatting, stairs, bathing, toileting, and dressing  PARTICIPATION LIMITATIONS: meal prep, cleaning, laundry, medication management, personal finances, driving, shopping, community activity, occupation, and yard work  PERSONAL FACTORS: Behavior pattern and 3+  comorbidities: sick sinus syndrome, vertebral artery stenosis, pacemaker implant, developmental delay disorder, hypertension, and seizure disorder.  are also affecting patient's functional outcome.  REHAB POTENTIAL: Good  CLINICAL DECISION MAKING: Evolving/moderate complexity  EVALUATION COMPLEXITY: Moderate  PLAN:  PT FREQUENCY: 1-2x/week  PT DURATION: 12 weeks  PLANNED INTERVENTIONS: 97110-Therapeutic exercises, 97530- Therapeutic activity, 97112- Neuromuscular re-education, 97535- Self Care, 16109- Manual therapy, [phone]- Gait training, [phone]- Orthotic Fit/training, [phone]- Electrical stimulation (unattended), Patient/Family education, Balance training, Stair training, Joint mobilization, Cryotherapy, and Moist heat  PLAN FOR NEXT SESSION:    Continue LE strengthening dynamic balance and gait without AD     Golden Pop, PT DPT 03/20/2023, 8:22 AM   Physical Therapist - [facility]  8:22 AM 03/20/23

## 2023-03-20 NOTE — Therapy (Signed)
 Occupational Therapy Progress and Treatment Note Reporting period beginning 02/13/23-03/20/23   [name] MRN: [id number] DOB:[birth year], 63 y.o., male Today's Date: 03/20/2023  PCP: Billee Cashing, MD REFERRING PROVIDER: Terisa Starr, MD  END OF SESSION:  OT End of Session - 03/20/23 1029     Visit Number 20    Number of Visits 24    Date for OT Re-Evaluation 03/27/23    OT Start Time 0935    OT Stop Time 1015    OT Time Calculation (min) 40 min    Activity Tolerance Patient tolerated treatment well    Behavior During Therapy [facility] for tasks assessed/performed            Past Medical History:  Diagnosis Date   Depression    Hypertension    MGUS (monoclonal gammopathy of unknown significance)    Pervasive developmental disorder    Seizures (HCC)    SSS (sick sinus syndrome) (HCC)    PPM placed   Past Surgical History:  Procedure Laterality Date   PACEMAKER IMPLANT     Patient Active Problem List   Diagnosis Date Noted   Vertebral artery stenosis 12/18/2022   Left-sided weakness 12/17/2022   MGUS (monoclonal gammopathy of unknown significance) 07/07/2021   Multifocal pneumonia 12/20/2018   Seizure disorder (HCC) 12/20/2018   COVID-19 virus infection 12/20/2018   Acute respiratory failure with hypoxia (HCC) 12/20/2018   Essential hypertension 12/20/2018   Hyponatremia 12/20/2018   Pancytopenia (HCC) 12/20/2018   Developmental delay disorder 12/20/2018   Pneumonia due to COVID-19 virus 12/20/2018   CKD (chronic kidney disease) stage 3, GFR 30-59 ml/min (HCC) 12/20/2018   Age-related nuclear cataract of both eyes 07/18/2017   Early dry stage nonexudative age-related macular degeneration of both eyes 07/18/2017   Myopia with astigmatism and presbyopia, bilateral 07/18/2017   Onychomycosis 06/29/2016   Risk for falls 02/10/2015   Pacemaker 11/06/2014   Septic shock (HCC) 11/06/2014   Dysarthria 10/31/2014   Altered mental status 10/30/2014    Thrombocytopenia (HCC) 10/08/2014   Medication monitoring encounter 04/24/2014   Tremor 04/24/2014   Unsteadiness on feet 04/24/2014   History of nonmelanoma skin cancer 07/22/2013   Colon polyps 07/09/2013   Enuresis, primary, functional 05/09/2013   Gait disturbance 04/17/2013   Hemorrhoid 04/17/2013   Intention tremor 04/17/2013   Actinic keratosis 05/27/2010   Major depressive disorder, single episode, severe, with psychotic behavior (HCC) 03/21/2010   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   Pervasive developmental disorder 03/21/2010   Obsessive-compulsive disorder 03/07/2010   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 reports doing well this morning. Pt accompanied by:  Self  PERTINENT HISTORY:   Pt. is a 63 y.o. male who was admitted to the hospital with Left sided weakness, and left sided weakness, vertebral artery stenosis, sick sinus syndrome, Essential tremor, Pervasive Developmental Delay Disorder, Pacemaker implant s/p 6-7 years per caregiver. Pt. Resides at [facility], and attends [facility].   PRECAUTIONS: ICD/Pacemaker 6-7 years, Fall, Pureed diet, with sipping cup  WEIGHT BEARING RESTRICTIONS: No  PAIN:  Are you having pain? No  FALLS: Has patient fallen in last 6 months? Yes. Number of falls 2  LIVING ENVIRONMENT: Lives with: [name] Lives in:  No steps to enter Has following equipment at home: Walker - 2 wheeled and shower chair  PLOF: Independent with basic ADLs  PATIENT GOALS:    To be able to walk like everyone else  OBJECTIVE:  Note: Objective measures were completed at Evaluation unless otherwise noted.  HAND DOMINANCE:  Left  ADLs:  Transfers/ambulation related to ADLs: Eating: Independent uses a sipping cup, pureed diet Grooming: Independent with brushing teeth, caregiver assist with shaving UB Dressing: MinA LB Dressing: Supervision Toileting: Caregiver assist Bathing: Caregiver assist Tub Shower transfers: MinA Equipment: none  IADLs: Shopping: N/A Light housekeeping:  Caregiver Assist now Meal Prep: Pureed/ Group Home prepares all meals Community mobility:  Relies on transportation services Medication management: Caregivers provide medication management Financial management: Caregivers Assist Handwriting: 50% legible  MOBILITY STATUS: Needs Assist: RW and Hx of falls  POSTURE COMMENTS:  Leans to the left in supported  sitting   ACTIVITY TOLERANCE: Activity tolerance: Limited  FUNCTIONAL OUTCOME MEASURES: FOTO: 73 03/20/23: Discontinue FOTO  UPPER EXTREMITY ROM:    Active ROM Right eval Right 02/13/23 Right 03/20/23 Left eval Left 02/13/23 Left 03/20/23  Shoulder flexion 74(100) 95(110) 102 (123) 80(122) 94(125) 105 (124)  Shoulder abduction 73(94) 84(98) 90 (112) 70(98) 90(110) 102 (105)  Shoulder adduction        Shoulder extension        Shoulder internal rotation        Shoulder external rotation        Elbow flexion [facility]  [facility] WFL   Elbow extension [facility]  [facility] WFL   Wrist flexion [facility]  [facility] WFL   Wrist extension [facility]  [facility] WFL   Wrist ulnar deviation        Wrist radial deviation        Wrist pronation        Wrist supination        (Blank rows = not tested)  UPPER EXTREMITY MMT:     MMT Right eval Right 02/13/23 Left eval Left 02/13/23  Shoulder flexion 3-/5 3/5 3-/5 3/5  Shoulder abduction 3-/5 3/5 3-/5 3/5  Shoulder adduction      Shoulder extension      Shoulder internal rotation      Shoulder external rotation      Middle trapezius      Lower trapezius      Elbow flexion 4/5 4/5 4/5 4/5  Elbow extension 4-/5 4/5 4-/5 4/5  Wrist flexion 4-/5  4/5 4-/5 4/5  Wrist extension 4-/5 4/5 4-/5 4/5  Wrist ulnar deviation      Wrist radial deviation      Wrist pronation      Wrist supination      (Blank rows = not tested)  HAND FUNCTION: Grip strength: Right: 38 lbs; Left: 27 lbs, Lateral pinch: Right: 13 lbs, Left: 14 lbs, and 3 point pinch: Right: 16 lbs, Left: 13 lbs  02/13/2023:  Grip strength: Right: 30 lbs; Left: 27 lbs, Lateral pinch: Right: 16 lbs, Left: 14 lbs, and 3 point pinch: Right: 18 lbs, Left: 14 lbs  03/20/23: Grip strength: Right: 35 lbs, Left: 30 lbs, Lateral pinch: Right: 14 lbs, Left: 12 lbs, 3 point pinch: Right: 15 lbs, Left: 13 lbs   COORDINATION: 9 Hole Peg test: Right: 55 sec; Left: 47 sec  02/13/2023:  9 Hole Peg test: Right: 1 min.; Left: 1 min. & 6 sec  03/20/23: 9 Hole Peg Test: Right: 47 sec,  Left: 54 sec  SENSATION: Light Touch: TBD Proprioception: TBD  EDEMA: N/A  MUSCLE TONE: Ataxia  COGNITION: Overall cognitive status: Within functional limits for tasks assessed   Caregiver reports becomes agitated more easily   VISION ASSESSMENT:  Glasses  PERCEPTION: Intact  PRAXIS: Impaired: Motor planning   TODAY'S TREATMENT:                                                                                                                              DATE: 03/20/2023  Therapeutic Exercise: -UE strengthening using the SciFit for 8 min. on level 4.0. with constant monitoring of the BUEs and vc for maintaining attention to task.  -Passive stretching completed for L/R shoulder flex, abd, ER, active IR, working to increase flexibility for UB ADLs and overhead reach  Therapeutic Activity: Objective measures taken and goals updated for progress assessment.  PATIENT EDUCATION: Education details:  progress towards goals Person educated: Patient Education method: Explanation and Verbal cues Education comprehension: verbalized understanding and needs further education  HOME EXERCISE PROGRAM: To  continue to assess, and provide as needed   GOALS: Goals reviewed with patient? Yes  SHORT TERM GOALS: Target date: 02/13/2023   Pt. Will require supervision with HEPs for BE UEs. Baseline: 02/13/23:  Eval: No current HEP; 03/20/23: No current HEP Goal status: ongoing  LONG TERM GOALS: Target date: 03/27/2023  Pt. Will assume upright sitting with minimal cues for the duration of a 15 min. Task.  Baseline: 03/20/23: Less left sided leaning; 02/13/2023: Pt. Is improving with upright midline sitting, and presents with less left sided leaning. Eval: Pt. with positive left sided leaning. Goal status:  Progressing/Ongoing   2.  Pt. Will increase bilateral shoulder flexion ROM by 10 degrees to assist with bathing/washing his hair Baseline: 03/20/23: shoulder flexion: Right: 102 (123), Left: 105 (124); 02/13/2023: shoulder flexion: Right: 95(110) Left: 94(125) Eval: shoulder flexion: Right: 74(100) Left: 16(109)  Goal status: Progressing/Ongoing  3.  Pt. Will improve bilateral shoulder abduction ROM by 10 degrees to assist with UE dressing skills. Baseline: 03/20/23: shoulder abduction: Right: 90 (112), Left: 102 (105); 02/13/2023: shoulder abduction: Right: 84(95), Left: 90(110) Eval: shoulder abduction: Right: 73(94), Left: 70(98) Goal status: Progressing/Ongoing  4.  Pt. Will improve left grip strength by 5# to be able to hold items securely Baseline: 03/20/23: Right: 35#, Left: 30#; 02/13/2023: Right: 30# Left: 27# Eval: Right: 38# Left: 27# Goal status:  Ongoing  5.  Pt. Will improve [facility] skills by 10 sec to be able to independently, and efficiently manipulate small items. Baseline: 03/20/23: Left: 54 sec, Right: 47 sec; 02/13/23: Left 1 min. & 6 sec. Eval: Left 55 sec Goal status Improved/Ongoing  6.  Pt. will increase FOTO score by 2 points to reflect Pt. perceived improvement with assessment specific ADL/IADL's.  Baseline: 02/13/23: 57 Eval: 73 Goal status: FOTO  discontinued  ASSESSMENT:  CLINICAL IMPRESSION: Pt seen for 20th visit progress update.  Objective measures show improvement from 10th visit  to 20th visit in bilat grip strength, R/L passive and AROM at the shoulders, and 9 hole peg test bilaterally.  Pt tolerated passive stretching to bilat shoulders well this date and does continue to be limited in both flexion and abd bilaterally in the shoulders, limiting overhead reach and flexibility for ADLs.  Pt continues to present with weakness in bilat hands, limiting ability to securely hold ADL supplies in either hand.  Pt. continues to benefit from OT services for ADL training, A/E training, UE there. Ex., neuromuscular re-education, and Pt./caregiver education.  PERFORMANCE DEFICITS: in functional skills including ADLs, IADLs, coordination, dexterity, ROM, strength, Fine motor control, Gross motor control, balance, and UE functional use, cognitive skills including problem solving and safety awareness, and psychosocial skills including coping strategies, environmental adaptation, interpersonal interactions, and routines and behaviors.   IMPAIRMENTS: are limiting patient from ADLs, IADLs, work, play, leisure, and social participation.   CO-MORBIDITIES: has co-morbidities such as Pervasive Developmental Delay Disorder  that affects occupational performance. Patient will benefit from skilled OT to address above impairments and improve overall function.  MODIFICATION OR ASSISTANCE TO COMPLETE EVALUATION: Min-Moderate modification of tasks or assist with assess necessary to complete an evaluation.  OT OCCUPATIONAL PROFILE AND HISTORY: Detailed assessment: Review of records and additional review of physical, cognitive, psychosocial history related to current functional performance.  CLINICAL DECISION MAKING: Moderate - several treatment options, min-mod task modification necessary  REHAB POTENTIAL: Good  EVALUATION COMPLEXITY: Moderate    PLAN:  OT  FREQUENCY: 2x/week  OT DURATION: 12 weeks  PLANNED INTERVENTIONS: 97535 self care/ADL training, 40981 therapeutic exercise, 97530 therapeutic activity, 97112 neuromuscular re-education, 97140 manual therapy, 97010 moist heat, 97034 contrast bath, balance training, patient/family education, and DME and/or AE instructions  RECOMMENDED OTHER SERVICES: PT & ST  CONSULTED AND AGREED WITH PLAN OF CARE: Patient and family member/caregiver  PLAN FOR NEXT SESSION: see above  Danelle Earthly, MS, OTR/L   03/20/2023, 10:30 AM

## 2023-03-22 ENCOUNTER — Encounter: Payer: Self-pay | Admitting: Occupational Therapy

## 2023-03-22 ENCOUNTER — Ambulatory Visit: Payer: Medicare Other | Admitting: Physical Therapy

## 2023-03-22 ENCOUNTER — Ambulatory Visit: Payer: Medicare Other | Admitting: Occupational Therapy

## 2023-03-22 DIAGNOSIS — R278 Other lack of coordination: Secondary | ICD-10-CM

## 2023-03-22 DIAGNOSIS — M6281 Muscle weakness (generalized): Secondary | ICD-10-CM

## 2023-03-22 DIAGNOSIS — R2681 Unsteadiness on feet: Secondary | ICD-10-CM

## 2023-03-22 DIAGNOSIS — R262 Difficulty in walking, not elsewhere classified: Secondary | ICD-10-CM

## 2023-03-22 DIAGNOSIS — R2689 Other abnormalities of gait and mobility: Secondary | ICD-10-CM

## 2023-03-22 NOTE — Therapy (Signed)
 OUTPATIENT PHYSICAL THERAPY NEURO TREATMENT/ DISCHARGE     [name] MRN: [id number] DOB:[birth year], 63 y.o., male Today's Date: 03/26/2023   PCP: Domenic Schwab. Danae Chen, MD REFERRING PROVIDER: Rodney Cruise, MD  END OF SESSION:  PT End of Session - 03/26/23 1101     Visit Number 15    Number of Visits 24    Date for PT Re-Evaluation 03/27/23    Progress Note Due on Visit 20    PT Start Time 1100    PT Stop Time 1138    PT Time Calculation (min) 38 min    Equipment Utilized During Treatment Gait belt    Activity Tolerance Patient tolerated treatment well    Behavior During Therapy WFL for tasks assessed/performed                  Past Medical History:  Diagnosis Date   Depression    Hypertension    MGUS (monoclonal gammopathy of unknown significance)    Pervasive developmental disorder    Seizures (HCC)    SSS (sick sinus syndrome) (HCC)    PPM placed   Past Surgical History:  Procedure Laterality Date   PACEMAKER IMPLANT     Patient Active Problem List   Diagnosis Date Noted   Vertebral artery stenosis 12/18/2022   Left-sided weakness 12/17/2022   MGUS (monoclonal gammopathy of unknown significance) 07/07/2021   Multifocal pneumonia 12/20/2018   Seizure disorder (HCC) 12/20/2018   COVID-19 virus infection 12/20/2018   Acute respiratory failure with hypoxia (HCC) 12/20/2018   Essential hypertension 12/20/2018   Hyponatremia 12/20/2018   Pancytopenia (HCC) 12/20/2018   Developmental delay disorder 12/20/2018   Pneumonia due to COVID-19 virus 12/20/2018   CKD (chronic kidney disease) stage 3, GFR 30-59 ml/min (HCC) 12/20/2018   Age-related nuclear cataract of both eyes 07/18/2017   Early dry stage nonexudative age-related macular degeneration of both eyes 07/18/2017   Myopia with astigmatism and presbyopia, bilateral 07/18/2017   Onychomycosis 06/29/2016   Risk for falls 02/10/2015   Pacemaker 11/06/2014   Septic shock (HCC)  11/06/2014   Dysarthria 10/31/2014   Altered mental status 10/30/2014   Thrombocytopenia (HCC) 10/08/2014   Medication monitoring encounter 04/24/2014   Tremor 04/24/2014   Unsteadiness on feet 04/24/2014   History of nonmelanoma skin cancer 07/22/2013   Colon polyps 07/09/2013   Enuresis, primary, functional 05/09/2013   Gait disturbance 04/17/2013   Hemorrhoid 04/17/2013   Intention tremor 04/17/2013   Actinic keratosis 05/27/2010   Major depressive disorder, single episode, severe, with psychotic behavior (HCC) 03/21/2010   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   Pervasive developmental disorder 03/21/2010   Obsessive-compulsive disorder 03/07/2010   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   Obesity due to excess calories 02/25/2010    ONSET DATE: November 2024  REFERRING DIAG:  Diagnosis  G45.9 (ICD-10-CM) - TIA (transient ischemic attack)    THERAPY DIAG:  Other lack of coordination  Muscle weakness (generalized)  Difficulty in walking, not elsewhere classified  Unsteadiness on feet  Rationale for Evaluation and Treatment: Rehabilitation  SUBJECTIVE:  SUBJECTIVE STATEMENT: Patient caregiver aware today is last day.   No pain reported by pt.   Pt accompanied by:  self  PERTINENT HISTORY:  From eval  Pt is a 63 year old male presenting to physical therapy for issues concerning balance and strength. At baseline, pt has a history of intellectual disabilities and lives in a group home. Pt is accompanied by his caretaker where they report he had a "slight stroke" early November where he spent a couple of days at [facility] on the third floor. According to ED notes from his stay (11/9-11/11), pt was brought in after a syncopal episode where they found no evidence of stroke on the CT, but  significant vertebral artery stenosis. Prior to this recent admission, he was living in a group home and independent with most ADL's including getting himself dressed and bathing. Since then, he now requires assistance with basic ADL's and uses a two wheeled rolling walker for ambulation. He reports his left side is now "weaker" since his hospital stay. In the last six months, he has had two falls with both taking place in the bathroom. His caretaker reports one fall was when he was attempting to get out of the shower and hit his head on the sink. He has had two hospital admissions over the last year both related to falls. His typical day consists of going to school at the group home and watching television. He enjoys looking at antique shops in his free time and enjoys home improvement shows.   PMH: sick sinus syndrome, vertebral artery stenosis, pacemaker implant, I/DD, hypertension, and seizure disorder. Pt is referred to OPPT after recent increase in falls, syncope, concern for TIA. According to ED notes from his stay (11/9-11/11), pt was brought in after a syncopal episode where they found no evidence of stroke on the CT, but significant vertebral artery stenosis. While in the ED, pacemaker interrogated, ruled out. At baseline pt was living in a group home and independent with most ADL including getting himself dressed and bathing. Since then, he now requires assistance with basic ADL and uses a two wheeled rolling walker for ambulation. Several ED visits since October due to falls. Pt enjoys looking at antique shops in his free time and watching home improvement shows.    PAIN:  Are you having pain? No and pt reports achy feeling in the morning   PRECAUTIONS: Fall  WEIGHT BEARING RESTRICTIONS: No  FALLS: Has patient fallen in last 6 months? Yes. Number of falls 2 Pt's caretaker reports one fall when trying to get out of the shower where he hit his head on the sink.   LIVING ENVIRONMENT: Lives  with: lives in an adult home Lives in: Group home Stairs: No  Has following equipment at home: Dan Humphreys - 2 wheeled and shower chair rolling wl and shower chair   PLOF: Needs assistance with ADLs   PATIENT GOALS: work on stairs, walk like he used to  OBJECTIVE:  Note: Objective measures were completed at Evaluation unless otherwise noted.  DIAGNOSTIC FINDINGS:   MRI HEAD WITHOUT CONTRAST- 12/18/2022  COMPARISON:  Head CT December 16, 2022.   FINDINGS: Brain: Scattered foci no acute infarction, hemorrhage, hydrocephalus, extra-axial collection or mass lesion. Scattered foci T2 hyperintensity are seen within the white matter of the cerebral hemispheres, nonspecific. Mild parenchymal volume loss.   IMPRESSION: 1. No acute intracranial abnormality. 2. Scattered foci of T2 hyperintensity within the white matter of the cerebral hemispheres, nonspecific but may represent mild  chronic microvascular ischemic changes. 3. Mild parenchymal volume loss.    VITALS BP: 107/67 mmHg in seated HR: 70 bpm   COGNITION: Overall cognitive status: History of cognitive impairments - at baseline   SENSATION: Not tested  POSTURE: forward head and increased thoracic kyphosis   LOWER EXTREMITY MMT:    MMT Right Eval Left Eval  Hip flexion 4 4-  Hip extension    Hip abduction 4-** 4-**  Hip adduction 4* 4*  Hip internal rotation    Hip external rotation    Knee flexion 3+ 3  Knee extension 3+ 3  Ankle dorsiflexion 4 3+  Ankle plantarflexion    Ankle inversion    Ankle eversion    (Blank rows = not tested) **Hip abduction and adduction tested in seated  TRANSFERS: Assistive device utilized: None  Sit to stand: Complete Independence Stand to sit: Complete Independence Chair to chair: Complete Independence   STAIRS: Assess at next visit   GAIT: Gait pattern: two point step to with rolling walker, decreased stride length, Right foot flat, Left foot flat, trunk flexed, narrow  BOS, poor foot clearance- Right, and poor foot clearance- Left Distance walked: >500 ft Assistive device utilized: Environmental consultant - 2 wheeled Level of assistance: SBA for safety  Comments: forward flexed trunk, increased adduction of LE, path deviation to the left, when completing turns pt lifts walker off the ground, narrow BOS, left lateral lean , genuvarus of right LE dynamic in mid stance , trendelenburg on right hip for ligamentous lateral stability in midstance, gait deviations dramatically more deviated after 4 minutes of ambulation resultant cross-over and catching of toes on contralateral heel in swing phase bilaterally   FUNCTIONAL TESTS:  5 times sit to stand: 29.3 with UE on thigh   Timed up and go (TUG): 27.96 with two wheeled walker, 34.21 without AD 6 minute walk test: 520 ft with rolling walker, increased fatigue  Berg Balance Scale:.36/56  PATIENT SURVEYS:  FOTO 31 - completed with assistance from SPT and caretaker   TODAY'S TREATMENT:                                                                                                                              DATE: 03/26/23  Physical therapy treatment session today consisted of completing assessment of goals and administration of testing as demonstrated and documented in flow sheet, treatment, and goals section of this note.    Throughout session, PT provided CGA for all standing activities for improved safety and reduced fall risk.    PATIENT EDUCATION: Education details: exercise technique Person educated: Patient and Caregiver   Education method: Explanation Education comprehension: verbalized understanding and needs further education  HOME EXERCISE PROGRAM: Access Code: [email or web address] URL: [email or web address] Date: 03/20/2023 Prepared by: Grier Rocher  Exercises - Standing March with Counter Support  - 1 x daily - 7 x weekly - 3 sets - 10 reps - 2 hold - Standing Hip Abduction with Counter  Support  - 1 x  daily - 7 x weekly - 3 sets - 10 reps - 3 hold - Sit to Stand with Arms Crossed  - 1 x daily - 7 x weekly - 3 sets - 10 reps - Standing Knee Flexion with Counter Support  - 1 x daily - 7 x weekly - 3 sets - 10 reps - Side Stepping with Counter Support  - 1 x daily - 7 x weekly - 3 sets - 10 reps - Backward Walking with Counter Support  - 1 x daily - 7 x weekly - 3 sets - 10 reps - Figure-8 Walking Around [place]  - 1 x daily - 7 x weekly - 3 sets - 10 reps - Mini Squat with Counter Support  - 1 x daily - 7 x weekly - 3 sets - 10 reps  GOALS: Goals reviewed with patient? Yes  SHORT TERM GOALS: Target date: 02/13/2023  Patient will be independent in home exercise program to improve strength/mobility for better functional independence with ADLs. Baseline: To be provided next visit  Goal status: INITIAL  2.  N/A Baseline:  Goal status: INITIAL    LONG TERM GOALS: Target date: 03/27/23  Patient will increase FOTO score to equal to or greater than   71  to demonstrate statistically significant improvement in mobility and quality of life.  Baseline: 58 1/23: 54 (completed by pt with mental handicap)  Goal status: deferred  2.  Patient (> 68 years old) will complete five times sit to stand test in < 15 seconds indicating an increased LE strength and improved balance. Baseline: 29.33 1/23. 20.66sec  2/17: 14.17 seconds Goal status:MET  3.  Patient will increase six minute walk test distance to >1000 for progression to community ambulator and improve gait ability Baseline: 520 ft with walker 1/23: 756ft. With RW  2/17: 990 ft without walker Goal status: In progress   4.  Patient will reduce timed up and go to <15 seconds to reduce fall risk and demonstrate improved transfer/gait ability. Baseline: 27.96 with walker, 34.21 without AD 1/23: 23.59 with with RW 18.87 without AD 2/17: 15 seconds with no AD  Goal status: MET  5.  Patient will increase Berg Balance score by > 6 points to  demonstrate decreased fall risk during functional activities. Baseline: 36 1/23: 40 2/17: 43/56 Goal status: MET  6.  Patient will ascend/descend 5 stairs with least restrictive assistive device without loss of balance to improve functional mobility.   Baseline: Assess next visit  1/23: supervision assist with BUE support on bil rails.  2/17: supervision with Bilateral railing.  Goal status: MET  ASSESSMENT:  CLINICAL IMPRESSION: Patient has met all goals and is ready for discharge.  Patients plan of care discussed with caregiver with primary therapist previous session. We will be happy to see patient in the future as needed.    OBJECTIVE IMPAIRMENTS: Abnormal gait, decreased activity tolerance, decreased balance, decreased cognition, decreased coordination, decreased endurance, decreased knowledge of use of DME, decreased mobility, difficulty walking, decreased strength, decreased safety awareness, and improper body mechanics.   ACTIVITY LIMITATIONS: carrying, lifting, bending, standing, squatting, stairs, bathing, toileting, and dressing  PARTICIPATION LIMITATIONS: meal prep, cleaning, laundry, medication management, personal finances, driving, shopping, community activity, occupation, and yard work  PERSONAL FACTORS: Behavior pattern and 3+ comorbidities: sick sinus syndrome, vertebral artery stenosis, pacemaker implant, developmental delay disorder, hypertension, and seizure disorder.  are also affecting patient's functional outcome.  REHAB POTENTIAL: Good  CLINICAL  DECISION MAKING: Evolving/moderate complexity  EVALUATION COMPLEXITY: Moderate  PLAN:  PT FREQUENCY: 1-2x/week  PT DURATION: 12 weeks  PLANNED INTERVENTIONS: 97110-Therapeutic exercises, 97530- Therapeutic activity, 97112- Neuromuscular re-education, 97535- Self Care, 16109- Manual therapy, [phone]- Gait training, [phone]- Orthotic Fit/training, [phone]- Electrical stimulation (unattended), Patient/Family education,  Balance training, Stair training, Joint mobilization, Cryotherapy, and Moist heat  PLAN FOR NEXT SESSION:    Continue LE strengthening dynamic balance and gait without AD  Communicate with caregivers to assess functional improvement in home setting.    Precious Bard, PT DPT 03/26/2023, 11:41 AM   Physical Therapist - [facility]  11:41 AM 03/26/23

## 2023-03-22 NOTE — Therapy (Signed)
 Occupational Therapy Progress and Treatment Note Reporting period beginning 02/13/23-03/20/23   [name] MRN: [id number] DOB:04-Dec-1960, 63 y.o., male Today's Date: 03/22/2023  PCP: Billee Cashing, MD REFERRING PROVIDER: Terisa Starr, MD  END OF SESSION:  OT End of Session - 03/22/23 0802     Visit Number 21    Number of Visits 24    Date for OT Re-Evaluation 03/27/23    OT Start Time 0803    OT Stop Time 0845    OT Time Calculation (min) 42 min    Activity Tolerance Patient tolerated treatment well    Behavior During Therapy [facility] for tasks assessed/performed            Past Medical History:  Diagnosis Date   Depression    Hypertension    MGUS (monoclonal gammopathy of unknown significance)    Pervasive developmental disorder    Seizures (HCC)    SSS (sick sinus syndrome) (HCC)    PPM placed   Past Surgical History:  Procedure Laterality Date   PACEMAKER IMPLANT     Patient Active Problem List   Diagnosis Date Noted   Vertebral artery stenosis 12/18/2022   Left-sided weakness 12/17/2022   MGUS (monoclonal gammopathy of unknown significance) 07/07/2021   Multifocal pneumonia 12/20/2018   Seizure disorder (HCC) 12/20/2018   COVID-19 virus infection 12/20/2018   Acute respiratory failure with hypoxia (HCC) 12/20/2018   Essential hypertension 12/20/2018   Hyponatremia 12/20/2018   Pancytopenia (HCC) 12/20/2018   Developmental delay disorder 12/20/2018   Pneumonia due to COVID-19 virus 12/20/2018   CKD (chronic kidney disease) stage 3, GFR 30-59 ml/min (HCC) 12/20/2018   Age-related nuclear cataract of both eyes 07/18/2017   Early dry stage nonexudative age-related macular degeneration of both eyes 07/18/2017   Myopia with astigmatism and presbyopia, bilateral 07/18/2017   Onychomycosis 06/29/2016   Risk for falls 02/10/2015   Pacemaker 11/06/2014   Septic shock (HCC) 11/06/2014   Dysarthria 10/31/2014   Altered mental status 10/30/2014    Thrombocytopenia (HCC) 10/08/2014   Medication monitoring encounter 04/24/2014   Tremor 04/24/2014   Unsteadiness on feet 04/24/2014   History of nonmelanoma skin cancer 07/22/2013   Colon polyps 07/09/2013   Enuresis, primary, functional 05/09/2013   Gait disturbance 04/17/2013   Hemorrhoid 04/17/2013   Intention tremor 04/17/2013   Actinic keratosis 05/27/2010   Major depressive disorder, single episode, severe, with psychotic behavior (HCC) 03/21/2010   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   Pervasive developmental disorder 03/21/2010   Obsessive-compulsive disorder 03/07/2010   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   Obesity due to excess calories 02/25/2010   ONSET DATE: 12/28/23  REFERRING DIAG: Left sided weakness  THERAPY DIAG:  Other lack of coordination  Muscle weakness (generalized)  Rationale for Evaluation and Treatment: Rehabilitation  SUBJECTIVE:   SUBJECTIVE STATEMENT: Pt. Reports feeling cold this morning. Pt accompanied by:  Self  PERTINENT HISTORY:   Pt. is a 63 y.o. male who was admitted to the hospital with Left sided weakness, and left sided weakness, vertebral artery stenosis, sick sinus syndrome, Essential tremor, Pervasive Developmental Delay Disorder, Pacemaker implant s/p 6-7 years per caregiver. Pt. Resides at [facility], and attends [facility].   PRECAUTIONS: ICD/Pacemaker 6-7 years, Fall, Pureed diet, with sipping cup  WEIGHT BEARING RESTRICTIONS: No  PAIN:  Are you having pain? No  FALLS: Has patient fallen in last 6 months? Yes. Number of falls 2  LIVING ENVIRONMENT: Lives with: [name] Lives in:  No steps to enter Has following equipment at home: Walker - 2 wheeled and shower chair  PLOF: Independent with basic ADLs  PATIENT GOALS:    To be able to walk like everyone else  OBJECTIVE:  Note: Objective measures were completed at Evaluation unless otherwise noted.  HAND DOMINANCE:  Left  ADLs:  Transfers/ambulation related to ADLs: Eating: Independent uses a sipping cup, pureed diet Grooming: Independent with brushing teeth, caregiver assist with shaving UB Dressing: MinA LB Dressing: Supervision Toileting: Caregiver assist Bathing: Caregiver assist Tub Shower transfers: MinA Equipment: none  IADLs: Shopping: N/A Light housekeeping:  Caregiver Assist now Meal Prep: Pureed/ Group Home prepares all meals Community mobility:  Relies on transportation services Medication management: Caregivers provide medication management Financial management: Caregivers Assist Handwriting: 50% legible  MOBILITY STATUS: Needs Assist: RW and Hx of falls  POSTURE COMMENTS:  Leans to the left in supported  sitting   ACTIVITY TOLERANCE: Activity tolerance: Limited  FUNCTIONAL OUTCOME MEASURES: FOTO: 73 03/20/23: Discontinue FOTO  UPPER EXTREMITY ROM:    Active ROM Right eval Right 02/13/23 Right 03/20/23 Left eval Left 02/13/23 Left 03/20/23  Shoulder flexion 74(100) 95(110) 102 (123) 80(122) 94(125) 105 (124)  Shoulder abduction 73(94) 84(98) 90 (112) 70(98) 90(110) 102 (105)  Shoulder adduction        Shoulder extension        Shoulder internal rotation        Shoulder external rotation        Elbow flexion [facility]  [facility] WFL   Elbow extension [facility]  [facility] WFL   Wrist flexion [facility]  [facility] WFL   Wrist extension [facility]  [facility] WFL   Wrist ulnar deviation        Wrist radial deviation        Wrist pronation        Wrist supination        (Blank rows = not tested)  UPPER EXTREMITY MMT:     MMT Right eval Right 02/13/23 Left eval Left 02/13/23  Shoulder flexion 3-/5 3/5 3-/5 3/5  Shoulder abduction 3-/5 3/5 3-/5 3/5  Shoulder adduction      Shoulder extension      Shoulder internal rotation      Shoulder external rotation      Middle trapezius      Lower trapezius      Elbow flexion 4/5 4/5 4/5 4/5  Elbow extension 4-/5 4/5 4-/5 4/5  Wrist flexion 4-/5  4/5 4-/5 4/5  Wrist extension 4-/5 4/5 4-/5 4/5  Wrist ulnar deviation      Wrist radial deviation      Wrist pronation      Wrist supination      (Blank rows = not tested)  HAND FUNCTION: Grip strength: Right: 38 lbs; Left: 27 lbs, Lateral pinch: Right: 13 lbs, Left: 14 lbs, and 3 point pinch: Right: 16 lbs, Left: 13 lbs  02/13/2023:  Grip strength: Right: 30 lbs; Left: 27 lbs, Lateral pinch: Right: 16 lbs, Left: 14 lbs, and 3 point pinch: Right: 18 lbs, Left: 14 lbs  03/20/23: Grip strength: Right: 35 lbs, Left: 30 lbs, Lateral pinch: Right: 14 lbs, Left: 12 lbs, 3 point pinch: Right: 15 lbs, Left: 13 lbs   COORDINATION: 9 Hole Peg test: Right: 55 sec; Left: 47 sec  02/13/2023:  9 Hole Peg test: Right: 1 min.; Left: 1 min. & 6 sec  03/20/23: 9 Hole Peg Test: Right: 47 sec,  Left: 54 sec  SENSATION: Light Touch: TBD Proprioception: TBD  EDEMA: N/A  MUSCLE TONE: Ataxia  COGNITION: Overall cognitive status: Within functional limits for tasks assessed   Caregiver reports becomes agitated more easily   VISION ASSESSMENT:  Glasses  PERCEPTION: Intact  PRAXIS: Impaired: Motor planning   TODAY'S TREATMENT:                                                                                                                              DATE: 03/22/2023  Therapeutic Exercise: -UE strengthening using the SciFit for 8 min. on level 5.0. with constant monitoring of the BUEs and vc for maintaining attention to task.  -Cane stretches using 1.5# dowel - stretching completed for L/R shoulder flex, abd, ER, active IR, working to increase flexibility for UB ADLs and overhead reach. Requires hand over hand placement and visual cues for technique.   Therapeutic Activity: - Participated in Connect 4 activity with use of 1# weight cuff for functional strengthening alternating L and R hand across 2 trials, cues for 3 point pinch grip and pulling chips to edge of table to pick up as pt fatigues.    Self Care: MIN A to adjust pants and don/doff belt in standing, assist for pulling pants up over rear and increased time to adjust belt. CGA + RW for toilet t/f, 1 minor LOB when pt turning away from RW to sit, self corrects with grab bar.   PATIENT EDUCATION: Education details:  progress towards goals Person educated: Patient Education method: Explanation and Verbal cues Education comprehension: verbalized understanding and needs further education  HOME EXERCISE PROGRAM: To continue to assess, and provide as needed   GOALS: Goals reviewed with patient? Yes  SHORT TERM GOALS: Target date: 02/13/2023   Pt. Will require supervision with HEPs for BE UEs. Baseline: 02/13/23:  Eval: No current HEP; 03/20/23: No current HEP Goal status: ongoing  LONG TERM GOALS: Target date: 03/27/2023  Pt. Will assume upright sitting with minimal cues for the duration of a 15 min. Task.  Baseline: 03/20/23: Less left sided leaning; 02/13/2023: Pt. Is improving with upright midline sitting, and presents with less left sided leaning. Eval: Pt. with positive left sided leaning. Goal status:  Progressing/Ongoing   2.  Pt. Will increase bilateral shoulder flexion ROM by 10 degrees to assist with bathing/washing his hair Baseline: 03/20/23: shoulder flexion: Right: 102 (123), Left: 105 (124); 02/13/2023: shoulder flexion: Right: 95(110) Left: 94(125) Eval: shoulder flexion: Right: 74(100) Left: 78(469)  Goal status: Progressing/Ongoing  3.  Pt. Will improve bilateral shoulder abduction ROM by 10 degrees to assist with UE dressing skills. Baseline: 03/20/23: shoulder abduction: Right: 90 (112), Left: 102 (105); 02/13/2023: shoulder abduction: Right: 84(95), Left: 90(110) Eval: shoulder abduction: Right: 73(94), Left: 70(98) Goal status: Progressing/Ongoing  4.  Pt. Will improve left grip strength by 5# to be able to hold items securely Baseline: 03/20/23: Right: 35#, Left: 30#; 02/13/2023: Right: 30# Left: 27# Eval:  Right: 38#  Left: 27# Goal status:  Ongoing  5.  Pt. Will improve [facility] skills by 10 sec to be able to independently, and efficiently manipulate small items. Baseline: 03/20/23: Left: 54 sec, Right: 47 sec; 02/13/23: Left 1 min. & 6 sec. Eval: Left 55 sec Goal status Improved/Ongoing  6.  Pt. will increase FOTO score by 2 points to reflect Pt. perceived improvement with assessment specific ADL/IADL's.  Baseline: 02/13/23: 57 Eval: 73 Goal status: FOTO discontinued  ASSESSMENT:  CLINICAL IMPRESSION: Pt tolerated increased resistance on SciFit to level 5.0 with 1 rest break. Continues to tolerate passive stretching to bilat shoulders with limitation in both flexion and abd bilaterally in the shoulders, limiting overhead reach and flexibility for ADLs. Pt continues to present with weakness in bilat hands, limiting ability to securely hold ADL supplies in either hand.  Pt. continues to benefit from OT services for ADL training, A/E training, UE there. Ex., neuromuscular re-education, and Pt./caregiver education.  PERFORMANCE DEFICITS: in functional skills including ADLs, IADLs, coordination, dexterity, ROM, strength, Fine motor control, Gross motor control, balance, and UE functional use, cognitive skills including problem solving and safety awareness, and psychosocial skills including coping strategies, environmental adaptation, interpersonal interactions, and routines and behaviors.   IMPAIRMENTS: are limiting patient from ADLs, IADLs, work, play, leisure, and social participation.   CO-MORBIDITIES: has co-morbidities such as Pervasive Developmental Delay Disorder  that affects occupational performance. Patient will benefit from skilled OT to address above impairments and improve overall function.  MODIFICATION OR ASSISTANCE TO COMPLETE EVALUATION: Min-Moderate modification of tasks or assist with assess necessary to complete an evaluation.  OT OCCUPATIONAL PROFILE AND HISTORY: Detailed assessment:  Review of records and additional review of physical, cognitive, psychosocial history related to current functional performance.  CLINICAL DECISION MAKING: Moderate - several treatment options, min-mod task modification necessary  REHAB POTENTIAL: Good  EVALUATION COMPLEXITY: Moderate    PLAN:  OT FREQUENCY: 2x/week  OT DURATION: 12 weeks  PLANNED INTERVENTIONS: 97535 self care/ADL training, 16109 therapeutic exercise, 97530 therapeutic activity, 97112 neuromuscular re-education, 97140 manual therapy, 97010 moist heat, 97034 contrast bath, balance training, patient/family education, and DME and/or AE instructions  RECOMMENDED OTHER SERVICES: PT & ST  CONSULTED AND AGREED WITH PLAN OF CARE: Patient and family member/caregiver  PLAN FOR NEXT SESSION: see above   Kathie Dike, M.S. OTR/L  03/22/23, 8:04 AM  ascom [phone]   03/22/2023, 8:04 AM

## 2023-03-22 NOTE — Therapy (Signed)
 OUTPATIENT PHYSICAL THERAPY NEURO TREATMENT     [name] MRN: [id number] DOB:[birth year], 63 y.o., male Today's Date: 03/22/2023   PCP: Domenic Schwab. Danae Chen, MD REFERRING PROVIDER: Rodney Cruise, MD  END OF SESSION:  PT End of Session - 03/22/23 0834     Visit Number 14    Number of Visits 24    Date for PT Re-Evaluation 03/27/23    Progress Note Due on Visit 20    PT Start Time 0845    PT Stop Time 0925    PT Time Calculation (min) 40 min    Equipment Utilized During Treatment Gait belt    Activity Tolerance Patient tolerated treatment well    Behavior During Therapy WFL for tasks assessed/performed                 Past Medical History:  Diagnosis Date   Depression    Hypertension    MGUS (monoclonal gammopathy of unknown significance)    Pervasive developmental disorder    Seizures (HCC)    SSS (sick sinus syndrome) (HCC)    PPM placed   Past Surgical History:  Procedure Laterality Date   PACEMAKER IMPLANT     Patient Active Problem List   Diagnosis Date Noted   Vertebral artery stenosis 12/18/2022   Left-sided weakness 12/17/2022   MGUS (monoclonal gammopathy of unknown significance) 07/07/2021   Multifocal pneumonia 12/20/2018   Seizure disorder (HCC) 12/20/2018   COVID-19 virus infection 12/20/2018   Acute respiratory failure with hypoxia (HCC) 12/20/2018   Essential hypertension 12/20/2018   Hyponatremia 12/20/2018   Pancytopenia (HCC) 12/20/2018   Developmental delay disorder 12/20/2018   Pneumonia due to COVID-19 virus 12/20/2018   CKD (chronic kidney disease) stage 3, GFR 30-59 ml/min (HCC) 12/20/2018   Age-related nuclear cataract of both eyes 07/18/2017   Early dry stage nonexudative age-related macular degeneration of both eyes 07/18/2017   Myopia with astigmatism and presbyopia, bilateral 07/18/2017   Onychomycosis 06/29/2016   Risk for falls 02/10/2015   Pacemaker 11/06/2014   Septic shock (HCC) 11/06/2014    Dysarthria 10/31/2014   Altered mental status 10/30/2014   Thrombocytopenia (HCC) 10/08/2014   Medication monitoring encounter 04/24/2014   Tremor 04/24/2014   Unsteadiness on feet 04/24/2014   History of nonmelanoma skin cancer 07/22/2013   Colon polyps 07/09/2013   Enuresis, primary, functional 05/09/2013   Gait disturbance 04/17/2013   Hemorrhoid 04/17/2013   Intention tremor 04/17/2013   Actinic keratosis 05/27/2010   Major depressive disorder, single episode, severe, with psychotic behavior (HCC) 03/21/2010   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   Pervasive developmental disorder 03/21/2010   Obsessive-compulsive disorder 03/07/2010   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   Obesity due to excess calories 02/25/2010    ONSET DATE: November 2024  REFERRING DIAG:  Diagnosis  G45.9 (ICD-10-CM) - TIA (transient ischemic attack)    THERAPY DIAG:  Other lack of coordination  Muscle weakness (generalized)  Difficulty in walking, not elsewhere classified  Unsteadiness on feet  Other abnormalities of gait and mobility  Rationale for Evaluation and Treatment: Rehabilitation  SUBJECTIVE:  SUBJECTIVE STATEMENT: Pt reports to PT following OT treatment. No reported medical updates. States that he has been losing weight over the last few months.   No pain reported by pt.   Pt accompanied by:  self  PERTINENT HISTORY:  From eval  Pt is a 63 year old male presenting to physical therapy for issues concerning balance and strength. At baseline, pt has a history of intellectual disabilities and lives in a group home. Pt is accompanied by his caretaker where they report he had a "slight stroke" early November where he spent a couple of days at [facility] on the third floor. According to ED notes from his  stay (11/9-11/11), pt was brought in after a syncopal episode where they found no evidence of stroke on the CT, but significant vertebral artery stenosis. Prior to this recent admission, he was living in a group home and independent with most ADL's including getting himself dressed and bathing. Since then, he now requires assistance with basic ADL's and uses a two wheeled rolling walker for ambulation. He reports his left side is now "weaker" since his hospital stay. In the last six months, he has had two falls with both taking place in the bathroom. His caretaker reports one fall was when he was attempting to get out of the shower and hit his head on the sink. He has had two hospital admissions over the last year both related to falls. His typical day consists of going to school at the group home and watching television. He enjoys looking at antique shops in his free time and enjoys home improvement shows.   PMH: sick sinus syndrome, vertebral artery stenosis, pacemaker implant, I/DD, hypertension, and seizure disorder. Pt is referred to OPPT after recent increase in falls, syncope, concern for TIA. According to ED notes from his stay (11/9-11/11), pt was brought in after a syncopal episode where they found no evidence of stroke on the CT, but significant vertebral artery stenosis. While in the ED, pacemaker interrogated, ruled out. At baseline pt was living in a group home and independent with most ADL including getting himself dressed and bathing. Since then, he now requires assistance with basic ADL and uses a two wheeled rolling walker for ambulation. Several ED visits since October due to falls. Pt enjoys looking at antique shops in his free time and watching home improvement shows.    PAIN:  Are you having pain? No and pt reports achy feeling in the morning   PRECAUTIONS: Fall  WEIGHT BEARING RESTRICTIONS: No  FALLS: Has patient fallen in last 6 months? Yes. Number of falls 2 Pt's caretaker  reports one fall when trying to get out of the shower where he hit his head on the sink.   LIVING ENVIRONMENT: Lives with: lives in an adult home Lives in: Group home Stairs: No  Has following equipment at home: Dan Humphreys - 2 wheeled and shower chair rolling wl and shower chair   PLOF: Needs assistance with ADLs   PATIENT GOALS: work on stairs, walk like he used to  OBJECTIVE:  Note: Objective measures were completed at Evaluation unless otherwise noted.  DIAGNOSTIC FINDINGS:   MRI HEAD WITHOUT CONTRAST- 12/18/2022  COMPARISON:  Head CT December 16, 2022.   FINDINGS: Brain: Scattered foci no acute infarction, hemorrhage, hydrocephalus, extra-axial collection or mass lesion. Scattered foci T2 hyperintensity are seen within the white matter of the cerebral hemispheres, nonspecific. Mild parenchymal volume loss.   IMPRESSION: 1. No acute intracranial abnormality. 2. Scattered foci  of T2 hyperintensity within the white matter of the cerebral hemispheres, nonspecific but may represent mild chronic microvascular ischemic changes. 3. Mild parenchymal volume loss.    VITALS BP: 107/67 mmHg in seated HR: 70 bpm   COGNITION: Overall cognitive status: History of cognitive impairments - at baseline   SENSATION: Not tested  POSTURE: forward head and increased thoracic kyphosis   LOWER EXTREMITY MMT:    MMT Right Eval Left Eval  Hip flexion 4 4-  Hip extension    Hip abduction 4-** 4-**  Hip adduction 4* 4*  Hip internal rotation    Hip external rotation    Knee flexion 3+ 3  Knee extension 3+ 3  Ankle dorsiflexion 4 3+  Ankle plantarflexion    Ankle inversion    Ankle eversion    (Blank rows = not tested) **Hip abduction and adduction tested in seated  TRANSFERS: Assistive device utilized: None  Sit to stand: Complete Independence Stand to sit: Complete Independence Chair to chair: Complete Independence   STAIRS: Assess at next visit   GAIT: Gait pattern:  two point step to with rolling walker, decreased stride length, Right foot flat, Left foot flat, trunk flexed, narrow BOS, poor foot clearance- Right, and poor foot clearance- Left Distance walked: >500 ft Assistive device utilized: Environmental consultant - 2 wheeled Level of assistance: SBA for safety  Comments: forward flexed trunk, increased adduction of LE, path deviation to the left, when completing turns pt lifts walker off the ground, narrow BOS, left lateral lean , genuvarus of right LE dynamic in mid stance , trendelenburg on right hip for ligamentous lateral stability in midstance, gait deviations dramatically more deviated after 4 minutes of ambulation resultant cross-over and catching of toes on contralateral heel in swing phase bilaterally   FUNCTIONAL TESTS:  5 times sit to stand: 29.3 with UE on thigh   Timed up and go (TUG): 27.96 with two wheeled walker, 34.21 without AD 6 minute walk test: 520 ft with rolling walker, increased fatigue  Berg Balance Scale:.36/56  PATIENT SURVEYS:  FOTO 16 - completed with assistance from SPT and caretaker   TODAY'S TREATMENT:                                                                                                                              DATE: 03/22/23  In parallel bars with UE support:  Stepping over 3 bolsters on way x 5 laps with intermittent step through/step to.  Side stepping over 3 bolsters one way x 4 bil.  Standing on airex pad 3 x 30 sec no UE support  Standing on airex pad with 1 foot on 6 inch step 4; no UE support x 12 sec bil CGA/min assist for improved posture to prevent lateral lean to the L.  Cross body reach x 15 bil to target to improve trunk rotation/weight shift over the RLE.  No UE support    Gait without AD 2 x 130ft,  mild L lateral lean cues  for improved posture with only mild improvement.  Weave through 8 cones without AD x 4. Cues for posture and attention to task to prevent kicking cones. No change in posture  instruction from PT while navigating. Cones.  Weave around 2 cones and over 2 canes x 5 laps. Cues for position of BLE prior to stepping over cane to allow full clearance of obstacles.    Throughout session, PT provided CGA for all standing activities for improved safety and reduced fall risk.    PATIENT EDUCATION: Education details: exercise technique Person educated: Patient and Caregiver   Education method: Explanation Education comprehension: verbalized understanding and needs further education  HOME EXERCISE PROGRAM: Access Code: [email or web address] URL: [email or web address] Date: 03/20/2023 Prepared by: Grier Rocher  Exercises - Standing March with Counter Support  - 1 x daily - 7 x weekly - 3 sets - 10 reps - 2 hold - Standing Hip Abduction with Counter Support  - 1 x daily - 7 x weekly - 3 sets - 10 reps - 3 hold - Sit to Stand with Arms Crossed  - 1 x daily - 7 x weekly - 3 sets - 10 reps - Standing Knee Flexion with Counter Support  - 1 x daily - 7 x weekly - 3 sets - 10 reps - Side Stepping with Counter Support  - 1 x daily - 7 x weekly - 3 sets - 10 reps - Backward Walking with Counter Support  - 1 x daily - 7 x weekly - 3 sets - 10 reps - Figure-8 Walking Around Cones  - 1 x daily - 7 x weekly - 3 sets - 10 reps - Mini Squat with Counter Support  - 1 x daily - 7 x weekly - 3 sets - 10 reps  GOALS: Goals reviewed with patient? Yes  SHORT TERM GOALS: Target date: 02/13/2023  Patient will be independent in home exercise program to improve strength/mobility for better functional independence with ADLs. Baseline: To be provided next visit  Goal status: INITIAL  2.  N/A Baseline:  Goal status: INITIAL    LONG TERM GOALS: Target date: 03/27/23  Patient will increase FOTO score to equal to or greater than   71  to demonstrate statistically significant improvement in mobility and quality of life.  Baseline: 58 1/23: 54 (completed by pt with mental handicap)   Goal status: INITIAL  2.  Patient (> 2 years old) will complete five times sit to stand test in < 15 seconds indicating an increased LE strength and improved balance. Baseline: 29.33 1/23. 20.66sec  Goal status: in progress   3.  Patient will increase six minute walk test distance to >1000 for progression to community ambulator and improve gait ability Baseline: 520 ft with walker 1/23: 765ft. With RW  Goal status: In progress   4.  Patient will reduce timed up and go to <15 seconds to reduce fall risk and demonstrate improved transfer/gait ability. Baseline: 27.96 with walker, 34.21 without AD 1/23: 23.59 with with RW 18.87 without AD Goal status: in progress   5.  Patient will increase Berg Balance score by > 6 points to demonstrate decreased fall risk during functional activities. Baseline: 36 1/23: 40 Goal status: in progress   6.  Patient will ascend/descend 5 stairs with least restrictive assistive device without loss of balance to improve functional mobility.   Baseline: Assess next visit  1/23: supervision assist with BUE support on bil rails.  Goal  status: in progress  ASSESSMENT:  CLINICAL IMPRESSION: Pt arrived motivated to participate. Put forth excellent effort throughout session. PT treatment focused on improved dynamic balance and gait training with emphasis on posture, step length and reduced lateral lean to the L. Pt able to correct posture with constant instruction from PT, but difficulty with maintaining correction without cues from PT. Has noted to demonstrate improved safety overall without AD compared to prior sessions.  The pt will continue to benefit from skilled physical therapy decrease risk of falls, improve quality of life, and maximize function.     OBJECTIVE IMPAIRMENTS: Abnormal gait, decreased activity tolerance, decreased balance, decreased cognition, decreased coordination, decreased endurance, decreased knowledge of use of DME, decreased  mobility, difficulty walking, decreased strength, decreased safety awareness, and improper body mechanics.   ACTIVITY LIMITATIONS: carrying, lifting, bending, standing, squatting, stairs, bathing, toileting, and dressing  PARTICIPATION LIMITATIONS: meal prep, cleaning, laundry, medication management, personal finances, driving, shopping, community activity, occupation, and yard work  PERSONAL FACTORS: Behavior pattern and 3+ comorbidities: sick sinus syndrome, vertebral artery stenosis, pacemaker implant, developmental delay disorder, hypertension, and seizure disorder.  are also affecting patient's functional outcome.  REHAB POTENTIAL: Good  CLINICAL DECISION MAKING: Evolving/moderate complexity  EVALUATION COMPLEXITY: Moderate  PLAN:  PT FREQUENCY: 1-2x/week  PT DURATION: 12 weeks  PLANNED INTERVENTIONS: 97110-Therapeutic exercises, 97530- Therapeutic activity, [id number]- Neuromuscular re-education, 97535- Self Care, [id number]- Manual therapy, [phone]- Gait training, [phone]- Orthotic Fit/training, [phone]- Electrical stimulation (unattended), Patient/Family education, Balance training, Stair training, Joint mobilization, Cryotherapy, and Moist heat  PLAN FOR NEXT SESSION:    Discharge assessment. Spoke with Care giver on 2/14 - she in agreement for d/c from PT due to improved progress with mobility.    Golden Pop, PT DPT 03/22/2023, 8:36 AM   Physical Therapist - [facility]  8:36 AM 03/22/23

## 2023-03-26 ENCOUNTER — Ambulatory Visit: Payer: Medicare Other

## 2023-03-26 ENCOUNTER — Ambulatory Visit: Payer: Medicare Other | Admitting: Occupational Therapy

## 2023-03-26 DIAGNOSIS — R278 Other lack of coordination: Secondary | ICD-10-CM

## 2023-03-26 DIAGNOSIS — M6281 Muscle weakness (generalized): Secondary | ICD-10-CM

## 2023-03-26 DIAGNOSIS — R2681 Unsteadiness on feet: Secondary | ICD-10-CM

## 2023-03-26 DIAGNOSIS — R262 Difficulty in walking, not elsewhere classified: Secondary | ICD-10-CM

## 2023-03-26 NOTE — Therapy (Addendum)
 Occupational Therapy Neuro Treatment/Discharge Note    [name] MRN: [id number] DOB:[birth year], 63 y.o., male Today's Date: 03/26/2023  PCP: Frank Cashing, MD REFERRING PROVIDER: Terisa Starr, MD  END OF SESSION:  OT End of Session - 03/26/23 1638     Visit Number 22    Number of Visits 24    Date for OT Re-Evaluation 03/27/23    OT Start Time 1145    OT Stop Time 1230    OT Time Calculation (min) 45 min    Activity Tolerance Patient tolerated treatment well    Behavior During Therapy [facility] for tasks assessed/performed            Past Medical History:  Diagnosis Date   Depression    Hypertension    MGUS (monoclonal gammopathy of unknown significance)    Pervasive developmental disorder    Seizures (HCC)    SSS (sick sinus syndrome) (HCC)    PPM placed   Past Surgical History:  Procedure Laterality Date   PACEMAKER IMPLANT     Patient Active Problem List   Diagnosis Date Noted   Vertebral artery stenosis 12/18/2022   Left-sided weakness 12/17/2022   MGUS (monoclonal gammopathy of unknown significance) 07/07/2021   Multifocal pneumonia 12/20/2018   Seizure disorder (HCC) 12/20/2018   COVID-19 virus infection 12/20/2018   Acute respiratory failure with hypoxia (HCC) 12/20/2018   Essential hypertension 12/20/2018   Hyponatremia 12/20/2018   Pancytopenia (HCC) 12/20/2018   Developmental delay disorder 12/20/2018   Pneumonia due to COVID-19 virus 12/20/2018   CKD (chronic kidney disease) stage 3, GFR 30-59 ml/min (HCC) 12/20/2018   Age-related nuclear cataract of both eyes 07/18/2017   Early dry stage nonexudative age-related macular degeneration of both eyes 07/18/2017   Myopia with astigmatism and presbyopia, bilateral 07/18/2017   Onychomycosis 06/29/2016   Risk for falls 02/10/2015   Pacemaker 11/06/2014   Septic shock (HCC) 11/06/2014   Dysarthria 10/31/2014   Altered mental status 10/30/2014   Thrombocytopenia (HCC) 10/08/2014    Medication monitoring encounter 04/24/2014   Tremor 04/24/2014   Unsteadiness on feet 04/24/2014   History of nonmelanoma skin cancer 07/22/2013   Colon polyps 07/09/2013   Enuresis, primary, functional 05/09/2013   Gait disturbance 04/17/2013   Hemorrhoid 04/17/2013   Intention tremor 04/17/2013   Actinic keratosis 05/27/2010   Major depressive disorder, single episode, severe, with psychotic behavior (HCC) 03/21/2010   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   Pervasive developmental disorder 03/21/2010   Obsessive-compulsive disorder 03/07/2010   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 Reports  doing okay today Pt accompanied by:  Self  PERTINENT HISTORY:   Pt. is a 63 y.o. male who was admitted to the [facility] with Left sided weakness, and left sided weakness, vertebral artery stenosis, sick sinus syndrome, Essential tremor, Pervasive Developmental Delay Disorder, Pacemaker implant s/p 6-7 years per caregiver. Pt. Resides at [facility], and attends [facility].   PRECAUTIONS: ICD/Pacemaker 6-7 years, Fall, Pureed diet, with sipping cup  WEIGHT BEARING RESTRICTIONS: No  PAIN:  Are you having pain? No  FALLS: Has patient fallen in last 6 months? Yes. Number of falls 2  LIVING ENVIRONMENT: Lives with: [facility] Lives in: No steps to enter  Has following equipment at home: Walker - 2 wheeled and shower chair  PLOF: Independent with basic ADLs  PATIENT GOALS:    To be able to walk like everyone else  OBJECTIVE:  Note: Objective measures were completed at Evaluation unless otherwise noted.  HAND DOMINANCE: Left  ADLs:  Transfers/ambulation related to  ADLs: Eating: Independent uses a sipping cup, pureed diet Grooming: Independent with brushing teeth, caregiver assist with shaving UB Dressing: MinA LB Dressing: Supervision Toileting: Caregiver assist Bathing: Caregiver assist Tub Shower transfers: MinA Equipment: none  IADLs: Shopping: N/A Light housekeeping:  Caregiver Assist now Meal Prep: Pureed/ Group Home prepares all meals Community mobility:  Relies on transportation services Medication management: Caregivers provide medication management Financial management: Caregivers Assist Handwriting: 50% legible  MOBILITY STATUS: Needs Assist: RW and Hx of falls  POSTURE COMMENTS:  Leans to the left in supported  sitting   ACTIVITY TOLERANCE: Activity tolerance: Limited  FUNCTIONAL OUTCOME MEASURES: FOTO: 73 03/20/23: Discontinue FOTO  UPPER EXTREMITY ROM:    Active ROM Right eval Right 02/13/23 Right 03/20/23 Left eval Left 02/13/23 Left 03/20/23  Shoulder flexion 74(100) 95(110) 102 (123) 80(122) 94(125) 105 (124)  Shoulder abduction 73(94) 84(98) 90 (112) 70(98) 90(110) 102 (105)  Shoulder adduction        Shoulder extension        Shoulder internal rotation        Shoulder external rotation        Elbow flexion [facility]  [facility] WFL   Elbow extension [facility]  [facility] WFL   Wrist flexion [facility]  [facility] WFL   Wrist extension [facility]  [place] WFL   Wrist ulnar deviation        Wrist radial deviation        Wrist pronation        Wrist supination        (Blank rows = not tested)  UPPER EXTREMITY MMT:     MMT Right eval Right 02/13/23 Left eval Left 02/13/23  Shoulder flexion 3-/5 3/5 3-/5 3/5  Shoulder abduction 3-/5 3/5 3-/5 3/5  Shoulder adduction      Shoulder extension      Shoulder internal rotation      Shoulder external rotation      Middle trapezius      Lower trapezius      Elbow flexion 4/5 4/5 4/5 4/5  Elbow extension 4-/5 4/5 4-/5 4/5  Wrist flexion 4-/5 4/5 4-/5 4/5  Wrist extension 4-/5 4/5 4-/5 4/5   Wrist ulnar deviation      Wrist radial deviation      Wrist pronation      Wrist supination      (Blank rows = not tested)  HAND FUNCTION: Grip strength: Right: 38 lbs; Left: 27 lbs, Lateral pinch: Right: 13 lbs, Left: 14 lbs, and 3 point pinch: Right: 16 lbs, Left: 13 lbs  02/13/2023:  Grip strength: Right: 30 lbs; Left: 27 lbs, Lateral pinch: Right: 16 lbs, Left: 14 lbs, and 3 point pinch: Right: 18 lbs, Left: 14 lbs  03/20/23: Grip strength: Right: 35 lbs, Left: 30 lbs, Lateral pinch: Right: 14 lbs, Left: 12 lbs, 3 point pinch: Right: 15 lbs, Left: 13 lbs   COORDINATION: 9 Hole Peg test: Right: 55 sec; Left: 47 sec  02/13/2023:  9 Hole Peg test: Right: 1 min.; Left: 1 min. & 6 sec  03/20/23: 9 Hole Peg Test: Right: 47 sec,  Left: 54 sec    SENSATION:  Light Touch: TBD Proprioception: TBD  EDEMA: N/A  MUSCLE TONE: Ataxia  COGNITION: Overall cognitive status: Within functional limits for tasks assessed   Caregiver reports becomes agitated more easily   VISION ASSESSMENT:  Glasses  PERCEPTION: Intact  PRAXIS: Impaired: Motor planning   TODAY'S TREATMENT:                                                                                                                              DATE: 03/26/2023   Therapeutic Exercise:  -UE strengthening using the SciFit for 8 min. on level 4.0. with constant monitoring of the BUEs and vc for maintaining attention to task.  -Progressive gross grip strengthening with 23.4# of grip strength resistive force.  Therapeutic Activities: -lateral, and 3pt. Pinch strengthening using yellow, red, green, and blue, and black level resistive clips -Facilitated translatory movements moving clips from the lateral pinch position to the 3pt. Pinch position in preparation for securely placing them on the dowel.      PATIENT EDUCATION: Education details:  progress towards goals Person educated: Patient Education method: Explanation and Verbal  cues Education comprehension: verbalized understanding and needs further education  HOME EXERCISE [facility]: To continue to assess, and provide as needed   GOALS: Goals reviewed with patient? Yes  SHORT TERM GOALS: Target date: 02/13/2023   Pt. Will require supervision with HEPs for BE UEs. Baseline: 03/26/2023: Cues required for strengthening exercises.02/13/23:  Eval: No current HEP; 03/20/23: No current HEP Goal status: Discharged  LONG TERM GOALS: Target date: 03/27/2023  Pt. Will assume upright sitting with minimal cues for the duration of a 15 min. Task.  Baseline: D/C: Improved 03/20/23: Less left sided leaning; 02/13/2023: Pt. Is improving with upright midline sitting, and presents with less left sided leaning. Eval: Pt. with positive left sided leaning. Goal status: Achieved  2.  Pt. Will increase bilateral shoulder flexion ROM by 10 degrees to assist with bathing/washing his hair Baseline: D/C Goal met 03/20/23: shoulder flexion: Right: 102 (123), Left: 105 (124); 02/13/2023: shoulder flexion: Right: 95(110) Left: 94(125) Eval: shoulder flexion: Right: 74(100) Left: 29(562)  Goal status: Achieved  3.  Pt. Will improve bilateral shoulder abduction ROM by 10 degrees to assist with UE dressing skills. Baseline: D/C: Goal met 03/20/23: shoulder abduction: Right: 90 (112), Left: 102 (105); 02/13/2023: shoulder abduction: Right: 84(95), Left: 90(110) Eval: shoulder abduction: Right: 73(94), Left: 70(98) Goal status: Achieved  4.  Pt. Will improve left grip strength by 5# to be able to hold items securely Baseline: D/C: Goal partially met 03/20/23: Right: 35#, Left: 30#; 02/13/2023: Right: 30# Left: 27# Eval: Right: 38# Left: 27# Goal status: Achieved with the right, Improved in the left.   5.  Pt. Will improve [facility] skills by 10 sec to be able to independently, and efficiently manipulate small items. Baseline: D/C: Goal partially met 03/20/23: Left: 54 sec, Right: 47 sec; 02/13/23: Left 1 min. & 6  sec. Eval: Left 55 sec Goal  status: Achieved with the left, Improved with the right  6.  Pt. will increase FOTO score by 2 points to reflect Pt. perceived improvement with assessment specific ADL/IADL's.  Baseline: 02/13/23: 57 Eval: 73 Goal status: FOTO discontinued  ASSESSMENT:  CLINICAL IMPRESSION:  Pt. tolerates UE strengthening using the SciFit to level 4.0 with no rest breaks. Pt. has made progress overall with UE functioning, ROM, strength, and [facility] skills.  Pt.'s caregiver reports the Pt. is back to baseline functioning with daily ADL/IADL functioning. Pt. is now appropriate for discharge from OT services.   PERFORMANCE DEFICITS: in functional skills including ADLs, IADLs, coordination, dexterity, ROM, strength, Fine motor control, Gross motor control, balance, and UE functional use, cognitive skills including problem solving and safety awareness, and psychosocial skills including coping strategies, environmental adaptation, interpersonal interactions, and routines and behaviors.   IMPAIRMENTS: are limiting patient from ADLs, IADLs, work, play, leisure, and social participation.   CO-MORBIDITIES: has co-morbidities such as Pervasive Developmental Delay Disorder  that affects occupational performance. Patient will benefit from skilled OT to address above impairments and improve overall function.  MODIFICATION OR ASSISTANCE TO COMPLETE EVALUATION: Min-Moderate modification of tasks or assist with assess necessary to complete an evaluation.  OT OCCUPATIONAL PROFILE AND HISTORY: Detailed assessment: Review of records and additional review of physical, cognitive, psychosocial history related to current functional performance.  CLINICAL DECISION MAKING: Moderate - several treatment options, min-mod task modification necessary  REHAB POTENTIAL: Good  EVALUATION COMPLEXITY: Moderate    PLAN:  OT FREQUENCY: 2x/week  OT DURATION: 12 weeks  PLANNED INTERVENTIONS: 97535 self care/ADL  training, 16109 therapeutic exercise, 97530 therapeutic activity, 97112 neuromuscular re-education, 97140 manual therapy, 97010 moist heat, 97034 contrast bath, balance training, patient/family education, and DME and/or AE instructions  RECOMMENDED OTHER SERVICES: PT & ST  CONSULTED AND AGREED WITH PLAN OF CARE: Patient and family member/caregiver  PLAN FOR NEXT SESSION: see above   Olegario Messier, MS, OTR/L   03/26/23, 4:43 PM

## 2023-03-28 ENCOUNTER — Ambulatory Visit: Payer: Medicare Other | Admitting: Physical Therapy

## 2023-03-28 ENCOUNTER — Ambulatory Visit: Payer: Medicare Other | Admitting: Occupational Therapy

## 2023-03-28 ENCOUNTER — Encounter: Payer: Medicare Other | Admitting: Occupational Therapy

## 2023-04-02 ENCOUNTER — Encounter: Payer: Medicare Other | Admitting: Occupational Therapy

## 2023-04-02 ENCOUNTER — Ambulatory Visit: Payer: Medicare Other

## 2023-04-04 ENCOUNTER — Ambulatory Visit: Payer: Medicare Other

## 2023-04-04 ENCOUNTER — Encounter: Payer: Medicare Other | Admitting: Occupational Therapy

## 2023-04-09 ENCOUNTER — Ambulatory Visit: Payer: Medicare Other

## 2023-04-09 ENCOUNTER — Encounter: Payer: Medicare Other | Admitting: Occupational Therapy

## 2023-04-11 ENCOUNTER — Ambulatory Visit: Payer: Medicare Other

## 2023-04-11 ENCOUNTER — Encounter: Payer: Medicare Other | Admitting: Occupational Therapy

## 2023-04-16 ENCOUNTER — Encounter: Payer: Medicare Other | Admitting: Occupational Therapy

## 2023-04-16 ENCOUNTER — Ambulatory Visit: Payer: Medicare Other

## 2023-04-18 ENCOUNTER — Ambulatory Visit: Payer: Medicare Other

## 2023-04-18 ENCOUNTER — Encounter: Payer: Medicare Other | Admitting: Occupational Therapy

## 2023-04-23 ENCOUNTER — Ambulatory Visit: Payer: Medicare Other

## 2023-04-23 ENCOUNTER — Encounter: Payer: Medicare Other | Admitting: Occupational Therapy

## 2023-04-25 ENCOUNTER — Encounter: Payer: Medicare Other | Admitting: Occupational Therapy

## 2023-04-25 ENCOUNTER — Ambulatory Visit: Payer: Medicare Other

## 2023-04-30 ENCOUNTER — Encounter: Payer: Medicare Other | Admitting: Occupational Therapy

## 2023-04-30 ENCOUNTER — Ambulatory Visit: Payer: Medicare Other

## 2023-05-02 ENCOUNTER — Encounter: Payer: Medicare Other | Admitting: Occupational Therapy

## 2023-05-02 ENCOUNTER — Ambulatory Visit: Payer: Medicare Other | Admitting: Physical Therapy

## 2023-05-07 ENCOUNTER — Encounter: Payer: Medicare Other | Admitting: Occupational Therapy

## 2023-05-07 ENCOUNTER — Ambulatory Visit: Payer: Medicare Other | Admitting: Physical Therapy

## 2023-05-09 ENCOUNTER — Encounter: Payer: Medicare Other | Admitting: Occupational Therapy

## 2023-05-09 ENCOUNTER — Ambulatory Visit: Payer: Medicare Other | Admitting: Physical Therapy

## 2023-05-14 ENCOUNTER — Encounter: Payer: Medicare Other | Admitting: Occupational Therapy

## 2023-05-14 ENCOUNTER — Ambulatory Visit: Payer: Medicare Other | Admitting: Physical Therapy

## 2023-05-16 ENCOUNTER — Ambulatory Visit: Payer: Medicare Other | Admitting: Physical Therapy

## 2023-05-16 ENCOUNTER — Encounter: Payer: Medicare Other | Admitting: Occupational Therapy

## 2023-05-21 ENCOUNTER — Encounter: Payer: Medicare Other | Admitting: Occupational Therapy

## 2023-05-21 ENCOUNTER — Ambulatory Visit: Payer: Medicare Other | Admitting: Physical Therapy

## 2023-05-23 ENCOUNTER — Ambulatory Visit: Payer: Medicare Other | Admitting: Physical Therapy

## 2023-05-23 ENCOUNTER — Encounter: Payer: Medicare Other | Admitting: Occupational Therapy

## 2023-05-28 ENCOUNTER — Encounter: Payer: Medicare Other | Admitting: Occupational Therapy

## 2023-05-28 ENCOUNTER — Ambulatory Visit: Payer: Medicare Other | Admitting: Physical Therapy

## 2023-05-30 ENCOUNTER — Ambulatory Visit: Payer: Medicare Other | Admitting: Physical Therapy

## 2023-05-30 ENCOUNTER — Encounter: Payer: Medicare Other | Admitting: Occupational Therapy

## 2023-06-04 ENCOUNTER — Encounter: Payer: Medicare Other | Admitting: Occupational Therapy

## 2023-06-04 ENCOUNTER — Ambulatory Visit: Payer: Medicare Other | Admitting: Physical Therapy

## 2023-06-06 ENCOUNTER — Encounter: Payer: Medicare Other | Admitting: Occupational Therapy

## 2023-06-06 ENCOUNTER — Ambulatory Visit: Payer: Medicare Other | Admitting: Physical Therapy

## 2023-06-11 ENCOUNTER — Ambulatory Visit: Payer: Medicare Other | Admitting: Physical Therapy

## 2023-06-11 ENCOUNTER — Encounter: Payer: Medicare Other | Admitting: Occupational Therapy

## 2023-06-13 ENCOUNTER — Encounter: Payer: Medicare Other | Admitting: Occupational Therapy

## 2023-06-13 ENCOUNTER — Ambulatory Visit: Payer: Medicare Other | Admitting: Physical Therapy

## 2023-06-18 ENCOUNTER — Ambulatory Visit: Payer: Medicare Other | Admitting: Physical Therapy

## 2023-06-18 ENCOUNTER — Encounter: Payer: Medicare Other | Admitting: Occupational Therapy

## 2023-06-20 ENCOUNTER — Encounter: Payer: Medicare Other | Admitting: Occupational Therapy

## 2023-06-20 ENCOUNTER — Ambulatory Visit: Payer: Medicare Other | Admitting: Physical Therapy

## 2023-06-25 ENCOUNTER — Ambulatory Visit: Payer: Medicare Other | Admitting: Physical Therapy

## 2023-06-25 ENCOUNTER — Encounter: Payer: Medicare Other | Admitting: Occupational Therapy

## 2023-06-27 ENCOUNTER — Ambulatory Visit: Payer: Medicare Other | Admitting: Physical Therapy

## 2023-06-27 ENCOUNTER — Encounter: Payer: Medicare Other | Admitting: Occupational Therapy

## 2023-07-04 ENCOUNTER — Encounter: Payer: Medicare Other | Admitting: Occupational Therapy

## 2023-07-04 ENCOUNTER — Ambulatory Visit: Payer: Medicare Other | Admitting: Physical Therapy

## 2023-07-09 ENCOUNTER — Encounter: Payer: Medicare Other | Admitting: Occupational Therapy

## 2023-07-09 ENCOUNTER — Ambulatory Visit: Payer: Medicare Other | Admitting: Physical Therapy

## 2023-07-11 ENCOUNTER — Ambulatory Visit: Payer: Medicare Other | Admitting: Physical Therapy

## 2023-07-11 ENCOUNTER — Encounter: Payer: Medicare Other | Admitting: Occupational Therapy

## 2023-07-14 ENCOUNTER — Other Ambulatory Visit: Payer: Self-pay

## 2023-07-14 ENCOUNTER — Emergency Department

## 2023-07-14 ENCOUNTER — Emergency Department
Admission: EM | Admit: 2023-07-14 | Discharge: 2023-07-15 | Disposition: A | Discharge status: Home / Self Care | Attending: Emergency Medicine | Admitting: Emergency Medicine

## 2023-07-14 DIAGNOSIS — Y92199 Unspecified place in other specified residential institution as the place of occurrence of the external cause: Secondary | ICD-10-CM | POA: Diagnosis not present

## 2023-07-14 DIAGNOSIS — S59901A Unspecified injury of right elbow, initial encounter: Secondary | ICD-10-CM | POA: Diagnosis present

## 2023-07-14 DIAGNOSIS — W19XXXA Unspecified fall, initial encounter: Secondary | ICD-10-CM | POA: Diagnosis not present

## 2023-07-14 DIAGNOSIS — M7989 Other specified soft tissue disorders: Secondary | ICD-10-CM | POA: Insufficient documentation

## 2023-07-14 DIAGNOSIS — N189 Chronic kidney disease, unspecified: Secondary | ICD-10-CM | POA: Diagnosis not present

## 2023-07-14 DIAGNOSIS — Z950 Presence of cardiac pacemaker: Secondary | ICD-10-CM | POA: Insufficient documentation

## 2023-07-14 DIAGNOSIS — I129 Hypertensive chronic kidney disease with stage 1 through stage 4 chronic kidney disease, or unspecified chronic kidney disease: Secondary | ICD-10-CM | POA: Insufficient documentation

## 2023-07-14 DIAGNOSIS — Z79899 Other long term (current) drug therapy: Secondary | ICD-10-CM | POA: Diagnosis not present

## 2023-07-14 DIAGNOSIS — S5001XA Contusion of right elbow, initial encounter: Secondary | ICD-10-CM | POA: Diagnosis not present

## 2023-07-14 MED ORDER — IBUPROFEN 800 MG PO TABS
800.0000 mg | ORAL_TABLET | Freq: Once | ORAL | Status: AC
  Administered 2023-07-14: 800 mg via ORAL
  Filled 2023-07-14: qty 1

## 2023-07-14 NOTE — ED Triage Notes (Signed)
 Pt presented to ED BIBA from [facility] group home with c/o fall this evening around 2215. Per caregiver at bedside, pt went to the bathroom this evening and fell. Swelling noted to right elbow, small abrasion noted as well. A&Ox4.

## 2023-07-14 NOTE — Discharge Instructions (Addendum)
You may take Tylenol 1000 mg every 6 hours as needed for pain. °

## 2023-07-14 NOTE — ED Provider Notes (Signed)
 [facility] Provider Note    Event Date/Time   First MD Initiated Contact with Patient 07/14/23 2303     (approximate)   History   Joint Swelling   HPI  [facility] is a 63 y.o. male  with history of hypertension, CKD, SS s/p pacemaker, MGUS, seizures, developmental disorder who presents to the ED after a fall at his group home.  Patient states that he went to the bathroom and something was in the floor and he was not wearing his glasses and he fell onto his right side.  C/o R elbow pain, swelling and an abrasion.  Caregiver says Tdap is UTD.  Patient denies hitting his head.  No headache, neck or back pain.  No other injury.   History provided by patient, caregiver.    Past Medical History:  Diagnosis Date   Depression    Hypertension    MGUS (monoclonal gammopathy of unknown significance)    Pervasive developmental disorder    Seizures (HCC)    SSS (sick sinus syndrome) (HCC)    PPM placed    Past Surgical History:  Procedure Laterality Date   PACEMAKER IMPLANT      MEDICATIONS:  Prior to Admission medications   Medication Sig Start Date End Date Taking? Authorizing Provider  clonazePAM  (KLONOPIN ) 0.5 MG disintegrating tablet Take 0.5 mg by mouth 3 (three) times daily. 12/13/18   [provider]  cloNIDine  (CATAPRES  - DOSED IN MG/24 HR) 0.2 mg/24hr patch Place 0.2 mg onto the skin once a week. Thursday 11/29/18   [provider]  ferrous sulfate  325 (65 FE) MG tablet Take 325 mg by mouth daily.    [provider]  lamoTRIgine  (LAMICTAL ) 100 MG tablet Take 100 mg by mouth 2 (two) times daily. 12/04/18   [provider]  lamoTRIgine  (LAMICTAL ) 25 MG tablet Take 50-75 mg by mouth See admin instructions. Take in addition to 100 mg tablet for a total morning dose of 150mg  and bedtime dose of 175mg  12/04/18   [provider]  loratadine  (CLARITIN ) 10 MG tablet Take 10 mg by mouth daily.    [provider]  Melatonin 3 MG TABS Take 3 mg by mouth every evening.    [provider]  Multiple Vitamins-Minerals (PRESERVISION AREDS 2) CAPS Take 1 capsule by mouth 2 (two) times daily.    [provider]  Omega-3 Fatty Acids (FISH OIL) 1000 MG CAPS Take 2,000 mg by mouth daily.    [provider]  polyethylene glycol (MIRALAX  / GLYCOLAX ) 17 g packet Take 17 g by mouth in the morning, at noon, and at bedtime.    [provider]  QUEtiapine  (SEROQUEL ) 100 MG tablet Take 100-150 mg by mouth See admin instructions. Take 1 tablet by mouth twice a day then 1.5 tablets at bedtime 12/04/18   [provider]  QUEtiapine  (SEROQUEL ) 25 MG tablet Take 25 mg by mouth daily as needed (severe agitation). 11/07/18   [provider]  rosuvastatin  (CRESTOR ) 20 MG tablet Take 1 tablet (20 mg total) by mouth daily. 12/19/22 03/19/23  Verlyn Goad, MD  senna (SENOKOT) 8.6 MG TABS tablet Take 2 tablets by mouth at bedtime.    [provider]    Physical Exam   Triage Vital Signs: ED Triage Vitals [07/14/23 2304]  Encounter Vitals Group     BP (!) 176/90     Systolic BP Percentile      Diastolic BP Percentile  Pulse Rate 70     Resp 18     Temp 97.6 F (36.4 C)     Temp Source Oral     SpO2 100 %     Weight      Height      Head Circumference      Peak Flow      Pain Score 0     Pain Loc      Pain Education      Exclude from Growth Chart     Most recent vital signs: Vitals:   07/14/23 2330 07/15/23 0000  BP: (!) 157/76 (!) 154/84  Pulse: 62 60  Resp:    Temp:    SpO2: 99% 99%     CONSTITUTIONAL: Alert, responds appropriately to questions.  HEAD: Normocephalic; atraumatic EYES: Conjunctivae clear, PERRL, EOMI ENT: normal nose; no rhinorrhea; moist mucous membranes; pharynx without lesions noted; no dental injury; no septal hematoma, no epistaxis; no facial deformity or bony tenderness NECK: Supple, no midline spinal  tenderness, step-off or deformity; trachea midline CARD: RRR; S1 and S2 appreciated; no murmurs, no clicks, no rubs, no gallops RESP: Normal chest excursion without splinting or tachypnea; breath sounds clear and equal bilaterally; no wheezes, no rhonchi, no rales; no hypoxia or respiratory distress CHEST:  chest wall stable, no crepitus or ecchymosis or deformity, nontender to palpation; no flail chest ABD/GI: Non-distended; soft, non-tender, no rebound, no guarding; no ecchymosis or other lesions noted PELVIS:  stable, nontender to palpation BACK:  The back appears normal; no midline spinal tenderness, step-off or deformity EXT: TTP over the right posterior elbow with ecchymosis, soft tissue swelling and small abrasion.  Keeps elbow at 45 degrees with pain with flexion and extension.  No TTP over the distal forearm, wrist, hand or proximal humerus, shoulder.  2+ right radial pulse.  Compartments soft.  Otherwise extremities non-TTP. SKIN: Normal color for age and race; warm NEURO: No facial asymmetry, normal speech, moving all extremities equally  ED Results / Procedures / Treatments   LABS: (all labs ordered are listed, but only abnormal results are displayed) Labs Reviewed - No data to display   EKG:  EKG Interpretation Date/Time:    Ventricular Rate:    PR Interval:    QRS Duration:    QT Interval:    QTC Calculation:   R Axis:      Text Interpretation:            RADIOLOGY: My personal review and interpretation of imaging: X-ray shows no fracture.  I have personally reviewed all radiology reports. DG Elbow Complete Right Result Date: 07/14/2023 CLINICAL DATA:  Fall EXAM: RIGHT ELBOW - COMPLETE 3+ VIEW COMPARISON:  None Available. 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 No acute fracture or dislocation. 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 No acute fracture or dislocation. Electronically Signed   By: Juanetta Nordmann M.D.   On:  07/14/2023 23:36     PROCEDURES:  Critical Care performed: No     Procedures    IMPRESSION / MDM / ASSESSMENT AND PLAN / ED COURSE  I reviewed the triage vital signs and the nursing notes.  Pt here after mechanical fall with R elbow injury.    DIFFERENTIAL DIAGNOSIS (includes but not limited to):   contusion, abrasion, fracture, less likley dislocation  Patient's presentation is most consistent with acute complicated illness / injury requiring diagnostic workup.  PLAN: Will obtain xray, give Ibuprofen for pain.  NVI distally.  No other injury on exam.  Not concerned for NAT at this time.   MEDICATIONS GIVEN IN ED: Medications  ibuprofen (ADVIL) tablet 800 mg (800 mg Oral Given 07/14/23 2327)  bacitracin  ointment (1 Application Topical Given 07/15/23 0006)     ED COURSE: X-ray reviewed and interpreted by myself and radiologist and shows no fracture, dislocation.  Will clean wound, apply bacitracin  and sterile nonadherent dressing, Ace wrap for compression.  Recommended RICE, Tylenol  as needed.  Patient, caregiver comfortable with this plan.   At this time, I do not feel there is any life-threatening condition present. I reviewed all nursing notes, vitals, pertinent previous records.  All lab and urine results, EKGs, imaging ordered have been independently reviewed and interpreted by myself.  I reviewed all available radiology reports from any imaging ordered this visit.  Based on my assessment, I feel the patient is safe to be discharged home without further emergent workup and can continue workup as an outpatient as needed. Discussed all findings, treatment plan as well as usual and customary return precautions.  They verbalize understanding and are comfortable with this plan.  Outpatient follow-up has been provided as needed.  All questions have been answered. none   CONSULTS:  none   OUTSIDE RECORDS REVIEWED:  Reviewed recent PCP and cardiology notes.       FINAL  CLINICAL IMPRESSION(S) / ED DIAGNOSES   Final diagnoses:  Contusion of right elbow, initial encounter     Rx / DC Orders   ED Discharge Orders     None        Note:  This document was prepared using Dragon voice recognition software and may include unintentional dictation errors.   Cathren Sween, Clover Dao, DO 07/15/23 0030

## 2023-07-15 DIAGNOSIS — S5001XA Contusion of right elbow, initial encounter: Secondary | ICD-10-CM | POA: Diagnosis not present

## 2023-07-15 MED ORDER — BACITRACIN ZINC 500 UNIT/GM EX OINT
TOPICAL_OINTMENT | Freq: Once | CUTANEOUS | Status: AC
  Administered 2023-07-15: 1 via TOPICAL
  Filled 2023-07-15: qty 0.9

## 2023-07-15 NOTE — ED Notes (Signed)
 Ace bandage applied to right arm. Pt tolerated well. NADN. Pt and caregiver educated on discharge instructions and RICE. Verbalized understanding. NADN.

## 2023-07-16 ENCOUNTER — Encounter: Payer: Medicare Other | Admitting: Occupational Therapy

## 2023-07-16 ENCOUNTER — Ambulatory Visit: Payer: Medicare Other | Admitting: Physical Therapy

## 2023-07-18 ENCOUNTER — Ambulatory Visit: Payer: Medicare Other | Admitting: Physical Therapy

## 2023-07-18 ENCOUNTER — Encounter: Payer: Medicare Other | Admitting: Occupational Therapy

## 2023-07-23 ENCOUNTER — Encounter: Payer: Medicare Other | Admitting: Occupational Therapy

## 2023-07-23 ENCOUNTER — Ambulatory Visit: Payer: Medicare Other | Admitting: Physical Therapy

## 2023-07-25 ENCOUNTER — Encounter: Payer: Medicare Other | Admitting: Occupational Therapy

## 2023-07-25 ENCOUNTER — Ambulatory Visit: Payer: Medicare Other | Admitting: Physical Therapy

## 2023-07-30 ENCOUNTER — Ambulatory Visit: Payer: Medicare Other | Admitting: Physical Therapy

## 2023-07-30 ENCOUNTER — Encounter: Payer: Medicare Other | Admitting: Occupational Therapy

## 2023-08-01 ENCOUNTER — Encounter: Payer: Medicare Other | Admitting: Occupational Therapy

## 2023-08-01 ENCOUNTER — Ambulatory Visit: Payer: Medicare Other | Admitting: Physical Therapy

## 2023-08-06 ENCOUNTER — Ambulatory Visit: Payer: Medicare Other | Admitting: Physical Therapy

## 2023-08-06 ENCOUNTER — Encounter: Payer: Medicare Other | Admitting: Occupational Therapy

## 2023-08-08 ENCOUNTER — Ambulatory Visit: Payer: Medicare Other | Admitting: Physical Therapy

## 2023-08-08 ENCOUNTER — Encounter: Payer: Medicare Other | Admitting: Occupational Therapy

## 2023-08-13 ENCOUNTER — Encounter: Payer: Medicare Other | Admitting: Occupational Therapy

## 2023-08-13 ENCOUNTER — Ambulatory Visit: Payer: Medicare Other | Admitting: Physical Therapy

## 2023-08-15 ENCOUNTER — Ambulatory Visit: Payer: Medicare Other | Admitting: Physical Therapy

## 2023-08-15 ENCOUNTER — Encounter: Payer: Medicare Other | Admitting: Occupational Therapy

## 2023-08-20 ENCOUNTER — Ambulatory Visit: Payer: Medicare Other | Admitting: Physical Therapy

## 2023-08-20 ENCOUNTER — Encounter: Payer: Medicare Other | Admitting: Occupational Therapy

## 2023-08-22 ENCOUNTER — Encounter: Payer: Medicare Other | Admitting: Occupational Therapy

## 2023-08-22 ENCOUNTER — Ambulatory Visit: Payer: Medicare Other | Admitting: Physical Therapy

## 2023-08-27 ENCOUNTER — Encounter: Payer: Medicare Other | Admitting: Occupational Therapy

## 2023-08-27 ENCOUNTER — Ambulatory Visit: Payer: Medicare Other | Admitting: Physical Therapy

## 2023-08-29 ENCOUNTER — Ambulatory Visit: Payer: Medicare Other | Admitting: Physical Therapy

## 2023-08-29 ENCOUNTER — Encounter: Payer: Medicare Other | Admitting: Occupational Therapy

## 2023-10-09 ENCOUNTER — Ambulatory Visit: Attending: Family Medicine

## 2023-10-09 DIAGNOSIS — R278 Other lack of coordination: Secondary | ICD-10-CM | POA: Insufficient documentation

## 2023-10-09 DIAGNOSIS — M6281 Muscle weakness (generalized): Secondary | ICD-10-CM | POA: Insufficient documentation

## 2023-10-09 DIAGNOSIS — R262 Difficulty in walking, not elsewhere classified: Secondary | ICD-10-CM | POA: Insufficient documentation

## 2023-10-09 DIAGNOSIS — R2681 Unsteadiness on feet: Secondary | ICD-10-CM | POA: Insufficient documentation

## 2023-10-09 DIAGNOSIS — R2689 Other abnormalities of gait and mobility: Secondary | ICD-10-CM | POA: Insufficient documentation

## 2023-10-09 NOTE — Therapy (Signed)
 OUTPATIENT PHYSICAL THERAPY NEURO EVALUATION   [name] MRN: [id number] DOB:Jan 07, 1961, 63 y.o., male Today's Date: 10/09/2023   PCP: Lizette Lango, MD  REFERRING PROVIDER: Lizette Lango, MD   END OF SESSION:  PT End of Session - 10/09/23 1709     Visit Number 1    Number of Visits 25    Date for PT Re-Evaluation 01/01/24    PT Start Time 0854    PT Stop Time 0930    PT Time Calculation (min) 36 min          Past Medical History:  Diagnosis Date   Depression    Hypertension    MGUS (monoclonal gammopathy of unknown significance)    Pervasive developmental disorder    Seizures (HCC)    SSS (sick sinus syndrome) (HCC)    PPM placed   Past Surgical History:  Procedure Laterality Date   PACEMAKER IMPLANT     Patient Active Problem List   Diagnosis Date Noted   Vertebral artery stenosis 12/18/2022   Left-sided weakness 12/17/2022   MGUS (monoclonal gammopathy of unknown significance) 07/07/2021   Multifocal pneumonia 12/20/2018   Seizure disorder (HCC) 12/20/2018   COVID-19 virus infection 12/20/2018   Acute respiratory failure with hypoxia (HCC) 12/20/2018   Essential hypertension 12/20/2018   Hyponatremia 12/20/2018   Pancytopenia (HCC) 12/20/2018   Developmental delay disorder 12/20/2018   Pneumonia due to COVID-19 virus 12/20/2018   CKD (chronic kidney disease) stage 3, GFR 30-59 ml/min (HCC) 12/20/2018   Age-related nuclear cataract of both eyes 07/18/2017   Early dry stage nonexudative age-related macular degeneration of both eyes 07/18/2017   Myopia with astigmatism and presbyopia, bilateral 07/18/2017   Onychomycosis 06/29/2016   Risk for falls 02/10/2015   Pacemaker 11/06/2014   Septic shock (HCC) 11/06/2014   Dysarthria 10/31/2014   Altered mental status 10/30/2014   Thrombocytopenia (HCC) 10/08/2014   Medication monitoring encounter 04/24/2014   Tremor 04/24/2014   Unsteadiness on feet 04/24/2014   History of nonmelanoma skin cancer  07/22/2013   Colon polyps 07/09/2013   Enuresis, primary, functional 05/09/2013   Gait disturbance 04/17/2013   Hemorrhoid 04/17/2013   Intention tremor 04/17/2013   Actinic keratosis 05/27/2010   Major depressive disorder, single episode, severe, with psychotic behavior (HCC) 03/21/2010   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   Pervasive developmental disorder 03/21/2010   Obsessive-compulsive disorder 03/07/2010   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   Obesity due to excess calories 02/25/2010    ONSET DATE: 12/2022  REFERRING DIAG: R26.81 (ICD-10-CM) - Unsteadiness on feet     THERAPY DIAG:  Muscle weakness (generalized)  Difficulty in walking, not elsewhere classified  Unsteadiness on feet  Other abnormalities of gait and mobility  Other lack of coordination  Rationale for Evaluation and Treatment: Rehabilitation  SUBJECTIVE:  SUBJECTIVE STATEMENT:  Pt is accompanied by caregiver and helping with history.  Caregiver reports that the pt is walking more now than he had been in the past, usually walking 1/4 mile every day.  Caregiver reports that the pt has a callus on the R foot that may be contributing to the pt leaning more to the L when ambulating.  Caregiver reports that they are treating it with a cream, insoles, and compression stockings.  Pt otherwise reports he is doing well and denies any pain at this time.  Pt accompanied by: caregiver  PERTINENT HISTORY:   Pt is a 63 year old male presenting to physical therapy for issues concerning balance and strength. At baseline, pt has a history of intellectual disabilities and lives in a group home. Pt is accompanied by his caretaker where they report he had a slight stroke early November 2024 where he spent a couple of days at [facility] [address] on  the third floor. According to ED notes from his stay (11/9-11/11), pt was brought in after a syncopal episode where they found no evidence of stroke on the CT, but significant vertebral artery stenosis. Prior to this recent admission, he was living in a group home and independent with most ADL's including getting himself dressed and bathing. Since then, he now requires assistance with basic ADL's and uses a two wheeled rolling walker for ambulation. He reports his left side is now weaker since his hospital stay. In the last six months, he has had two falls with both taking place in the bathroom. His caretaker reports one fall was when he was attempting to get out of the shower and hit his head on the sink. He has had two hospital admissions over the last year both related to falls. His typical day consists of going to school at the group home and watching television. He enjoys looking at antique shops in his free time and enjoys home improvement shows.    PAIN:  Are you having pain? No  PRECAUTIONS: None  RED FLAGS: None   WEIGHT BEARING RESTRICTIONS: No  FALLS: Has patient fallen in last 6 months? No  LIVING ENVIRONMENT: Lives with: lives with their family and lives in an adult home Lives in: Group home Stairs: No Has following equipment at home: Vannie - 2 wheeled and shower chair  PLOF: Needs assistance with ADLs  PATIENT GOALS: Have better postural stability and be able to walk better.  OBJECTIVE:  Note: Objective measures were completed at Evaluation unless otherwise noted.  DIAGNOSTIC FINDINGS:   MRI HEAD WITHOUT CONTRAST- 12/18/2022   COMPARISON:  Head CT December 16, 2022.   FINDINGS: Brain: Scattered foci no acute infarction, hemorrhage, hydrocephalus, extra-axial collection or mass lesion. Scattered foci T2 hyperintensity are seen within the white matter of the cerebral hemispheres, nonspecific. Mild parenchymal volume loss.   IMPRESSION: 1. No acute intracranial  abnormality. 2. Scattered foci of T2 hyperintensity within the white matter of the cerebral hemispheres, nonspecific but may represent mild chronic microvascular ischemic changes. 3. Mild parenchymal volume loss.  COGNITION: Overall cognitive status: History of cognitive impairments - at baseline   SENSATION: WFL  COORDINATION: Pt with fair coordination, mostly having deficit due to the inability to understand the command initially, but with verbal cuing is able to perform each one with slightly slower response time from norm.  POSTURE: rounded shoulders, forward head, and increased thoracic kyphosis   LOWER EXTREMITY MMT:    MMT Right Eval Left Eval  Hip flexion 4+  4-  Hip extension    Hip abduction 4 4  Hip adduction 4- 4-  Hip internal rotation    Hip external rotation    Knee flexion 4+ 4-  Knee extension 4+ 4-  Ankle dorsiflexion 5 5  Ankle plantarflexion    Ankle inversion    Ankle eversion    (Blank rows = not tested)   STAIRS: Findings: Level of Assistance: Modified independence, Stair Negotiation Technique: Step to Pattern Alternating Pattern  with Bilateral Rails, Number of Stairs: 4, Height of Stairs: 6   , and Comments: Pt with use of bilateral hand rails ascending/descending.  Pt utilized alternating gait pattern ascending, step-to pattern descending  GAIT: Findings:  Pt ambulates without the use of AD, however has reduced stride length during ambulation.  Pt with L lateral lean when ambulating and reduced foot clearance bilaterally.  Pt displays trendelenburg on the R  FUNCTIONAL TESTS:  5 times sit to stand: 38.07 sec with UE on handrails Timed up and go (TUG): 26.54 sec 6 minute walk test: TBD 10 meter walk test: 15.48 sec; 0.65 m/s Berg Balance Scale: TBD at next visit                                                                                                                                TREATMENT DATE: 10/09/23    Evaluation   Self-Care/Home Management:  Pt given education on current POC, the findings of the evaluation, and the ways in which skilled therapy can address the current deficits in balance and musculature strength.  Pt instructed on how this can improve their overall function and maintain/improve their overall quality of life.      PATIENT EDUCATION: Education details: Pt educated on role of PT and services provided during current POC, along with prognosis and information about the clinic. Person educated: Patient and Caregiver   Education method: Explanation Education comprehension: verbalized understanding  HOME EXERCISE PROGRAM: TBD at next visit  GOALS: Goals reviewed with patient? Yes  SHORT TERM GOALS: Target date: 11/06/2023  Pt will be independent with HEP in order to demonstrate increased ability to perform tasks related to occupation/hobbies. Baseline: to be given at the next visit Goal status: INITIAL    LONG TERM GOALS: Target date: 01/01/2024  1.  Patient (> 33 years old) will complete five times sit to stand test in <24 seconds indicating an increased LE strength and improved balance. Baseline: 38.07 sec Goal status: INITIAL   2.  Patient will increase Berg Balance score by > 6 points to demonstrate decreased fall risk during functional activities. Baseline: TBD at subsequent visit Goal status: INITIAL   3.  Patient will reduce timed up and go to <11 seconds to reduce fall risk and demonstrate improved transfer/gait ability. Baseline: 26.54 sec Goal status: INITIAL  4.  Patient will increase 10 meter walk test to >1.54m/s as to improve gait speed for better community ambulation and to  reduce fall risk. Baseline: 15.48 sec; 0.65 m/s Goal status: INITIAL  5.  Patient will increase six minute walk test distance to >1000 for progression to community ambulator and improve gait ability Baseline: TBD at subsequent visit Goal status:  INITIAL    ASSESSMENT:  CLINICAL IMPRESSION: Patient is a 63 y.o. male who was seen today for physical therapy evaluation and treatment for unsteadiness on feet.  Pt currently demonstrating increased fall risk based on current assessments listed above.  Pt and caregiver both educated throughout the session on prognosis and rationale for needing therapy for the limitations that the pt currently has.  Pt and caregiver participated in the establishment of the POC.   Pt will continue to benefit from skilled therapy to address his current risk of falling and other deficits.  OBJECTIVE IMPAIRMENTS:  Abnormal gait, decreased activity tolerance, decreased balance, decreased cognition, decreased coordination, decreased endurance, decreased knowledge of use of DME, decreased mobility, difficulty walking, decreased strength, decreased safety awareness, and improper body mechanics.   ACTIVITY LIMITATIONS: carrying, lifting, bending, standing, squatting, stairs, bathing, toileting, and dressing   PARTICIPATION LIMITATIONS: meal prep, cleaning, laundry, medication management, personal finances, driving, shopping, community activity, occupation, and yard work  PERSONAL FACTORS: Behavior pattern and 3+ comorbidities:  sick sinus syndrome, vertebral artery stenosis, pacemaker implant, developmental delay disorder, hypertension, and seizure disorder  are also affecting patient's functional outcome.   REHAB POTENTIAL: Good  CLINICAL DECISION MAKING: Evolving/moderate complexity  EVALUATION COMPLEXITY: Moderate  PLAN:  PT FREQUENCY: 1-2x/week  PT DURATION: 12 weeks  PLANNED INTERVENTIONS: 97110-Therapeutic exercises, 97530- Therapeutic activity, 97112- Neuromuscular re-education, 97535- Self Care, [id number]- Manual therapy, [phone]- Gait training, [phone]- Orthotic Fit/training, [phone]- Electrical stimulation (unattended), Patient/Family education, Balance training, Stair training, Joint mobilization, Cryotherapy,  and Moist heat   PLAN FOR NEXT SESSION:   BERG develop HEP LE strengthening dynamic balance and gait without AD     Fonda Simpers, PT, DPT Physical Therapist - [facility]  10/09/23, 5:23 PM

## 2023-10-11 ENCOUNTER — Ambulatory Visit: Admitting: Physical Therapy

## 2023-10-11 DIAGNOSIS — R262 Difficulty in walking, not elsewhere classified: Secondary | ICD-10-CM

## 2023-10-11 DIAGNOSIS — M6281 Muscle weakness (generalized): Secondary | ICD-10-CM | POA: Diagnosis not present

## 2023-10-11 DIAGNOSIS — R278 Other lack of coordination: Secondary | ICD-10-CM

## 2023-10-11 DIAGNOSIS — R2681 Unsteadiness on feet: Secondary | ICD-10-CM

## 2023-10-11 DIAGNOSIS — R2689 Other abnormalities of gait and mobility: Secondary | ICD-10-CM

## 2023-10-11 NOTE — Therapy (Signed)
 OUTPATIENT PHYSICAL THERAPY NEURO EVALUATION   [name] MRN: [id number] DOB:Jun 16, 1960, 63 y.o., male Today's Date: 10/11/2023   PCP: Lizette Lango, MD  REFERRING PROVIDER: Lizette Lango, MD   END OF SESSION:  PT End of Session - 10/11/23 0939     Visit Number 2    Number of Visits 25    Date for PT Re-Evaluation 01/01/24    PT Start Time 0939    PT Stop Time 1017    PT Time Calculation (min) 38 min          Past Medical History:  Diagnosis Date   Depression    Hypertension    MGUS (monoclonal gammopathy of unknown significance)    Pervasive developmental disorder    Seizures (HCC)    SSS (sick sinus syndrome) (HCC)    PPM placed   Past Surgical History:  Procedure Laterality Date   PACEMAKER IMPLANT     Patient Active Problem List   Diagnosis Date Noted   Vertebral artery stenosis 12/18/2022   Left-sided weakness 12/17/2022   MGUS (monoclonal gammopathy of unknown significance) 07/07/2021   Multifocal pneumonia 12/20/2018   Seizure disorder (HCC) 12/20/2018   COVID-19 virus infection 12/20/2018   Acute respiratory failure with hypoxia (HCC) 12/20/2018   Essential hypertension 12/20/2018   Hyponatremia 12/20/2018   Pancytopenia (HCC) 12/20/2018   Developmental delay disorder 12/20/2018   Pneumonia due to COVID-19 virus 12/20/2018   CKD (chronic kidney disease) stage 3, GFR 30-59 ml/min (HCC) 12/20/2018   Age-related nuclear cataract of both eyes 07/18/2017   Early dry stage nonexudative age-related macular degeneration of both eyes 07/18/2017   Myopia with astigmatism and presbyopia, bilateral 07/18/2017   Onychomycosis 06/29/2016   Risk for falls 02/10/2015   Pacemaker 11/06/2014   Septic shock (HCC) 11/06/2014   Dysarthria 10/31/2014   Altered mental status 10/30/2014   Thrombocytopenia (HCC) 10/08/2014   Medication monitoring encounter 04/24/2014   Tremor 04/24/2014   Unsteadiness on feet 04/24/2014   History of nonmelanoma skin cancer  07/22/2013   Colon polyps 07/09/2013   Enuresis, primary, functional 05/09/2013   Gait disturbance 04/17/2013   Hemorrhoid 04/17/2013   Intention tremor 04/17/2013   Actinic keratosis 05/27/2010   Major depressive disorder, single episode, severe, with psychotic behavior (HCC) 03/21/2010   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   Pervasive developmental disorder 03/21/2010   Obsessive-compulsive disorder 03/07/2010   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 Pt reports that he is doing well. No pain. No other updates.    Pt accompanied by: caregiver  PERTINENT HISTORY:   Pt is a 63 year old male presenting to physical therapy for issues concerning balance and strength. At baseline, pt has a history of intellectual disabilities and lives in a group home. Pt is accompanied by his caretaker where they report he had a slight stroke early November 2024 where he spent a couple of days at [facility] on the third floor. According to ED notes from his stay (11/9-11/11), pt was brought in after a syncopal episode where they found no evidence of stroke on the CT, but significant vertebral artery stenosis. Prior to this recent admission, he was living in a group home and independent with most ADL's including getting himself dressed and bathing. Since then, he now requires  assistance with basic ADL's and uses a two wheeled rolling walker for ambulation. He reports his left side is now weaker since his hospital stay. In the last six months, he has had two falls with both taking place in the bathroom. His caretaker reports one fall was when he was attempting to get out of the shower and hit his head on the sink. He has had two hospital admissions over the last year both related to falls. His typical day consists of going to school at the group home and watching television. He enjoys looking at antique shops in his free time and enjoys home improvement shows.    PAIN:  Are you having pain? No  PRECAUTIONS: None  RED FLAGS: None   WEIGHT BEARING RESTRICTIONS: No  FALLS: Has patient fallen in last 6 months? No  LIVING ENVIRONMENT: Lives with: lives with their family and lives in an adult home Lives in: Group home Stairs: No Has following equipment at home: Vannie - 2 wheeled and shower chair  PLOF: Needs assistance with ADLs  PATIENT GOALS: Have better postural stability and be able to walk better.  OBJECTIVE:  Note: Objective measures were completed at Evaluation unless otherwise noted.  DIAGNOSTIC FINDINGS:   MRI HEAD WITHOUT CONTRAST- 12/18/2022   COMPARISON:  Head CT December 16, 2022.   FINDINGS: Brain: Scattered foci no acute infarction, hemorrhage, hydrocephalus, extra-axial collection or mass lesion. Scattered foci T2 hyperintensity are seen within the white matter of the cerebral hemispheres, nonspecific. Mild parenchymal volume loss.   IMPRESSION: 1. No acute intracranial abnormality. 2. Scattered foci of T2 hyperintensity within the white matter of the cerebral hemispheres, nonspecific but may represent mild chronic microvascular ischemic changes. 3. Mild parenchymal volume loss.  COGNITION: Overall cognitive status: History of cognitive impairments - at baseline   SENSATION: WFL  COORDINATION: Pt with fair coordination,  mostly having deficit due to the inability to understand the command initially, but with verbal cuing is able to perform each one with slightly slower response time from norm.  POSTURE: rounded shoulders, forward head, and increased thoracic kyphosis   LOWER EXTREMITY MMT:    MMT Right Eval Left Eval  Hip flexion 4+ 4-  Hip extension    Hip abduction 4 4  Hip adduction 4- 4-  Hip internal rotation    Hip external rotation    Knee flexion 4+ 4-  Knee extension 4+ 4-  Ankle dorsiflexion 5 5  Ankle plantarflexion    Ankle inversion    Ankle eversion    (Blank rows = not tested)   STAIRS: Findings: Level of  Assistance: Modified independence, Stair Negotiation Technique: Step to Pattern Alternating Pattern  with Bilateral Rails, Number of Stairs: 4, Height of Stairs: 6   , and Comments: Pt with use of bilateral hand rails ascending/descending.  Pt utilized alternating gait pattern ascending, step-to pattern descending  GAIT: Findings:  Pt ambulates without the use of AD, however has reduced stride length during ambulation.  Pt with L lateral lean when ambulating and reduced foot clearance bilaterally.  Pt displays trendelenburg on the R  FUNCTIONAL TESTS:  5 times sit to stand: 38.07 sec with UE on handrails Timed up and go (TUG): 26.54 sec 6 minute walk test: TBD 10 meter walk test: 15.48 sec; 0.65 m/s Berg Balance Scale: 39/56     [facility] PT Assessment - 10/11/23 0001       Berg Balance Test   Sit to Stand Able to stand without using hands and stabilize independently    Standing Unsupported Able to stand safely 2 minutes    Sitting with Back Unsupported but Feet Supported on Floor or Stool Able to sit safely and securely 2 minutes    Stand to Sit Sits safely with minimal use of hands    Transfers Able to transfer safely, minor use of hands    Standing Unsupported with Eyes Closed Able to stand 10 seconds with supervision    Standing Unsupported with Feet Together Able to  place feet together independently and stand for 1 minute with supervision    From Standing, Reach Forward with Outstretched Arm Can reach forward >5 cm safely (2)    From Standing Position, Pick up Object from Floor Able to pick up shoe, needs supervision    From Standing Position, Turn to Look Behind Over each Shoulder Turn sideways only but maintains balance    Turn 360 Degrees Able to turn 360 degrees safely but slowly    Standing Unsupported, Alternately Place Feet on Step/Stool Able to complete >2 steps/needs minimal assist    Standing Unsupported, One Foot in Front Able to take small step independently and hold 30 seconds    Standing on One Leg Tries to lift leg/unable to hold 3 seconds but remains standing independently    Total Score 39                                                                                                                                      TREATMENT DATE: 10/11/23  Patient demonstrates increased fall risk as noted by score of   39/56 on Berg Balance Scale.  (<36= high risk for falls, close to 100%; 37-45 significant >80%; 46-51 moderate >50%; 52-55 lower >25%)   6 Min Walk Test:  Instructed patient to ambulate as quickly and as safely as possible for 6 minutes using LRAD. Patient was allowed to take standing rest breaks without stopping the test, but if the patient required a sitting rest break the clock would be  stopped and the test would be over.  Results: 536 feet using no AD with supervision assist . Results indicate that the patient has reduced endurance with ambulation compared to age matched norms.  Age Matched Norms: 77-69 yo M: 47 F: 43, 71-79 yo M: 43 F: 471, 37-89 yo M: 417 F: 392 MDC: 58.21 meters (190.98 feet) or 50 meters (ANPTA Core Set of Outcome Measures for Adults with Neurologic Conditions, 2018)   HEP education   PATIENT EDUCATION: Education details: Pt educated on role of PT and services provided during current POC, along with  prognosis and information about the clinic. Person educated: Patient and Caregiver   Education method: Explanation Education comprehension: verbalized understanding  HOME EXERCISE PROGRAM: Access Code: [email or web address] URL: [email or web address] Date: 10/11/2023 Prepared by: Massie Dollar  Exercises - Standing March with Counter Support  - 1 x daily - 5 x weekly - 3 sets - 10 reps - 2 hold - Standing Knee Flexion with Unilateral Counter Support  - 1 x daily - 5 x weekly - 3 sets - 10 reps - 2 hold - Sit to Stand with Armchair  - 1 x daily - 5 x weekly - 3 sets - 5 reps - Side Stepping with Counter Support  - 1 x daily - 5 x weekly - 3 sets - 5 reps  GOALS: Goals reviewed with patient? Yes  SHORT TERM GOALS: Target date: 11/06/2023  Pt will be independent with HEP in order to demonstrate increased ability to perform tasks related to occupation/hobbies. Baseline: to be given at the next visit Goal status: INITIAL    LONG TERM GOALS: Target date: 01/01/2024  1.  Patient (> 81 years old) will complete five times sit to stand test in <24 seconds indicating an increased LE strength and improved balance. Baseline: 38.07 sec Goal status: INITIAL   2.  Patient will increase Berg Balance score by > 6 points to demonstrate decreased fall risk during functional activities. Baseline: 39/56 Goal status: INITIAL   3.  Patient will reduce timed up and go to <11 seconds to reduce fall risk and demonstrate improved transfer/gait ability. Baseline: 26.54 sec Goal status: INITIAL  4.  Patient will increase 10 meter walk test to >1.34m/s as to improve gait speed for better community ambulation and to reduce fall risk. Baseline: 15.48 sec; 0.65 m/s Goal status: INITIAL  5.  Patient will increase six minute walk test distance to >1000 for progression to community ambulator and improve gait ability Baseline:536 ft without AD.  Goal status: INITIAL    ASSESSMENT:  CLINICAL  IMPRESSION: Patient is a 63 y.o. male who was seen today for physical therapy  treatment for unsteadiness on feet.  Pt currently demonstrating increased fall risk based on assessments completed today listed above.  HEP to address BLE strength and balance deficits with hand out provided.   Continued to demonstrate lateral trunk lean to the L. Encouraged to position in seat to prevent continuation of  L lateral bias. Pt will continue to benefit from skilled therapy to address his current risk of falling and other deficits.  OBJECTIVE IMPAIRMENTS:  Abnormal gait, decreased activity tolerance, decreased balance, decreased cognition, decreased coordination, decreased endurance, decreased knowledge of use of DME, decreased mobility, difficulty walking, decreased strength, decreased safety awareness, and improper body mechanics.   ACTIVITY LIMITATIONS: carrying, lifting, bending, standing, squatting, stairs, bathing, toileting, and dressing   PARTICIPATION LIMITATIONS: meal prep, cleaning, laundry, medication management, personal finances, driving, shopping, community activity, occupation, and yard  work  PERSONAL FACTORS: Behavior pattern and 3+ comorbidities:  sick sinus syndrome, vertebral artery stenosis, pacemaker implant, developmental delay disorder, hypertension, and seizure disorder  are also affecting patient's functional outcome.   REHAB POTENTIAL: Good  CLINICAL DECISION MAKING: Evolving/moderate complexity  EVALUATION COMPLEXITY: Moderate  PLAN:  PT FREQUENCY: 1-2x/week  PT DURATION: 12 weeks  PLANNED INTERVENTIONS: 97110-Therapeutic exercises, 97530- Therapeutic activity, [id number]- Neuromuscular re-education, 97535- Self Care, [id number]- Manual therapy, [phone]- Gait training, [phone]- Orthotic Fit/training, [phone]- Electrical stimulation (unattended), Patient/Family education, Balance training, Stair training, Joint mobilization, Cryotherapy, and Moist heat   PLAN FOR NEXT SESSION:   Review HEP   Postural strengthening.  LE strengthening dynamic balance and gait without AD    Massie Dollar PT, DPT  Physical Therapist - [facility]  [facility]  10:23 AM 10/11/23

## 2023-10-16 ENCOUNTER — Ambulatory Visit: Admitting: Physical Therapy

## 2023-10-16 DIAGNOSIS — M6281 Muscle weakness (generalized): Secondary | ICD-10-CM

## 2023-10-16 DIAGNOSIS — R2681 Unsteadiness on feet: Secondary | ICD-10-CM

## 2023-10-16 DIAGNOSIS — R2689 Other abnormalities of gait and mobility: Secondary | ICD-10-CM

## 2023-10-16 DIAGNOSIS — R262 Difficulty in walking, not elsewhere classified: Secondary | ICD-10-CM

## 2023-10-16 NOTE — Therapy (Signed)
 OUTPATIENT PHYSICAL THERAPY NEURO TREATMENT   [name] MRN: [id number] DOB:1960-02-20, 63 y.o., male Today's Date: 10/16/2023   PCP: Lizette Lango, MD  REFERRING PROVIDER: Lizette Lango, MD   END OF SESSION:  PT End of Session - 10/16/23 1453     Visit Number 3    Number of Visits 25    Date for PT Re-Evaluation 01/01/24    PT Start Time 1449    PT Stop Time 1528    PT Time Calculation (min) 39 min    Activity Tolerance Patient tolerated treatment well    Behavior During Therapy [facility] for tasks assessed/performed           Past Medical History:  Diagnosis Date   Depression    Hypertension    MGUS (monoclonal gammopathy of unknown significance)    Pervasive developmental disorder    Seizures (HCC)    SSS (sick sinus syndrome) (HCC)    PPM placed   Past Surgical History:  Procedure Laterality Date   PACEMAKER IMPLANT     Patient Active Problem List   Diagnosis Date Noted   Vertebral artery stenosis 12/18/2022   Left-sided weakness 12/17/2022   MGUS (monoclonal gammopathy of unknown significance) 07/07/2021   Multifocal pneumonia 12/20/2018   Seizure disorder (HCC) 12/20/2018   COVID-19 virus infection 12/20/2018   Acute respiratory failure with hypoxia (HCC) 12/20/2018   Essential hypertension 12/20/2018   Hyponatremia 12/20/2018   Pancytopenia (HCC) 12/20/2018   Developmental delay disorder 12/20/2018   Pneumonia due to COVID-19 virus 12/20/2018   CKD (chronic kidney disease) stage 3, GFR 30-59 ml/min (HCC) 12/20/2018   Age-related nuclear cataract of both eyes 07/18/2017   Early dry stage nonexudative age-related macular degeneration of both eyes 07/18/2017   Myopia with astigmatism and presbyopia, bilateral 07/18/2017   Onychomycosis 06/29/2016   Risk for falls 02/10/2015   Pacemaker 11/06/2014   Septic shock (HCC) 11/06/2014   Dysarthria 10/31/2014   Altered mental status 10/30/2014   Thrombocytopenia (HCC) 10/08/2014   Medication  monitoring encounter 04/24/2014   Tremor 04/24/2014   Unsteadiness on feet 04/24/2014   History of nonmelanoma skin cancer 07/22/2013   Colon polyps 07/09/2013   Enuresis, primary, functional 05/09/2013   Gait disturbance 04/17/2013   Hemorrhoid 04/17/2013   Intention tremor 04/17/2013   Actinic keratosis 05/27/2010   Major depressive disorder, single episode, severe, with psychotic behavior (HCC) 03/21/2010   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   Pervasive developmental disorder 03/21/2010   Obsessive-compulsive disorder 03/07/2010   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 Pt reports that he is doing well. No pain. No other updates.    Pt accompanied by: caregiver  PERTINENT HISTORY:   Pt is a 63 year old male presenting to physical therapy for issues concerning balance and strength. At baseline, pt has a history of intellectual disabilities and lives in a group home. Pt is accompanied by his caretaker where they report he had a slight stroke early November 2024 where he spent a couple of days at [facility] on the third floor. According to ED notes from his stay (11/9-11/11), pt was brought in after a syncopal episode where they found no evidence of stroke on the CT, but significant vertebral artery stenosis. Prior to this recent admission, he was living in a group home and independent with  most ADL's including getting himself dressed and bathing. Since then, he now requires assistance with basic ADL's and uses a two wheeled rolling walker for ambulation. He reports his left side is now weaker since his hospital stay. In the last six months, he has had two falls with both taking place in the bathroom. His caretaker reports one fall was when he was attempting to get out of the shower and hit his head on the sink. He has had two hospital admissions over the last year both related to falls. His typical day consists of going to school at the group home and watching television. He enjoys looking at antique shops in his free time and enjoys home improvement shows.    PAIN:  Are you having pain? No  PRECAUTIONS: None  RED FLAGS: None   WEIGHT BEARING RESTRICTIONS: No  FALLS: Has patient fallen in last 6 months? No  LIVING ENVIRONMENT: Lives with: lives with their family and lives in an adult home Lives in: Group home Stairs: No Has following equipment at home: Vannie - 2 wheeled and shower chair  PLOF: Needs assistance with ADLs  PATIENT GOALS: Have better postural stability and be able to walk better.  OBJECTIVE:  Note: Objective measures were completed at Evaluation unless otherwise noted.  DIAGNOSTIC FINDINGS:   MRI HEAD WITHOUT CONTRAST- 12/18/2022   COMPARISON:  Head CT December 16, 2022.   FINDINGS: Brain: Scattered foci no acute infarction, hemorrhage, hydrocephalus, extra-axial collection or mass lesion. Scattered foci T2 hyperintensity are seen within the white matter of the cerebral hemispheres, nonspecific. Mild parenchymal volume loss.   IMPRESSION: 1. No acute intracranial abnormality. 2. Scattered foci of T2 hyperintensity within the white matter of the cerebral hemispheres, nonspecific but may represent mild chronic microvascular ischemic changes. 3. Mild parenchymal volume loss.  COGNITION: Overall cognitive status: History of cognitive  impairments - at baseline   SENSATION: WFL  COORDINATION: Pt with fair coordination, mostly having deficit due to the inability to understand the command initially, but with verbal cuing is able to perform each one with slightly slower response time from norm.  POSTURE: rounded shoulders, forward head, and increased thoracic kyphosis   LOWER EXTREMITY MMT:    MMT Right Eval Left Eval  Hip flexion 4+ 4-  Hip extension    Hip abduction 4 4  Hip adduction 4- 4-  Hip internal rotation    Hip external rotation    Knee flexion 4+ 4-  Knee extension 4+ 4-  Ankle dorsiflexion 5 5  Ankle plantarflexion    Ankle inversion    Ankle eversion    (Blank rows = not tested)   STAIRS: Findings: Level of  Assistance: Modified independence, Stair Negotiation Technique: Step to Pattern Alternating Pattern  with Bilateral Rails, Number of Stairs: 4, Height of Stairs: 6   , and Comments: Pt with use of bilateral hand rails ascending/descending.  Pt utilized alternating gait pattern ascending, step-to pattern descending  GAIT: Findings:  Pt ambulates without the use of AD, however has reduced stride length during ambulation.  Pt with L lateral lean when ambulating and reduced foot clearance bilaterally.  Pt displays trendelenburg on the R  FUNCTIONAL TESTS:  5 times sit to stand: 38.07 sec with UE on handrails Timed up and go (TUG): 26.54 sec 6 minute walk test: TBD 10 meter walk test: 15.48 sec; 0.65 m/s Berg Balance Scale: 39/56     [facility] PT Assessment - 10/11/23 0001       Berg Balance Test   Sit to Stand Able to stand without using hands and stabilize independently    Standing Unsupported Able to stand safely 2 minutes    Sitting with Back Unsupported but Feet Supported on Floor or Stool Able to sit safely and securely 2 minutes    Stand to Sit Sits safely with minimal use of hands    Transfers Able to transfer safely, minor use of hands    Standing Unsupported with Eyes Closed  Able to stand 10 seconds with supervision    Standing Unsupported with Feet Together Able to place feet together independently and stand for 1 minute with supervision    From Standing, Reach Forward with Outstretched Arm Can reach forward >5 cm safely (2)    From Standing Position, Pick up Object from Floor Able to pick up shoe, needs supervision    From Standing Position, Turn to Look Behind Over each Shoulder Turn sideways only but maintains balance    Turn 360 Degrees Able to turn 360 degrees safely but slowly    Standing Unsupported, Alternately Place Feet on Step/Stool Able to complete >2 steps/needs minimal assist    Standing Unsupported, One Foot in Front Able to take small step independently and hold 30 seconds    Standing on One Leg Tries to lift leg/unable to hold 3 seconds but remains standing independently    Total Score 39                                                                                                                                      TREATMENT DATE: 10/16/23  Nustep level 2 x 6 min for B UE and LE reciprocal movement Training.   Interval LE strength and walking  Seated LAQ 2 x 10 ea LE with 3# AW  Gait with 3# AW x 170 ft  Seated march 2 x 10 with 3# AW  Gait with 3# AW x 170 ft  Standing step tap 2 x 10 ea with 3# AW  Gait with 3# AW x 170 ft  Standing hip ABD 2 x 10  ea LE Gait with 3# AW x 320 ft   Static balance   Airex stance adducted stance 2 x 1 min   PATIENT EDUCATION: Education details: Pt educated on role of PT and services provided during current POC, along with prognosis and information about the clinic. Person educated: Patient and Caregiver   Education method: Explanation Education comprehension: verbalized understanding  HOME EXERCISE PROGRAM: Access Code: [email or web address] URL: [email or web address] Date: 10/11/2023 Prepared by: Massie Dollar  Exercises - Standing March with Counter Support  - 1 x daily - 5 x weekly  - 3 sets - 10 reps - 2 hold - Standing Knee Flexion with Unilateral Counter Support  - 1 x daily - 5 x weekly - 3 sets - 10 reps - 2 hold - Sit to Stand with Armchair  - 1 x daily - 5 x weekly - 3 sets - 5 reps - Side Stepping with Counter Support  - 1 x daily - 5 x weekly - 3 sets - 5 reps  GOALS: Goals reviewed with patient? Yes  SHORT TERM GOALS: Target date: 11/06/2023  Pt will be independent with HEP in order to demonstrate increased ability to perform tasks related to occupation/hobbies. Baseline: to be given at the next visit Goal status: INITIAL    LONG TERM GOALS: Target date: 01/01/2024  1.  Patient (> 21 years old) will complete five times sit to stand test in <24 seconds indicating an increased LE strength and improved balance. Baseline: 38.07 sec Goal status: INITIAL   2.  Patient will increase Berg Balance score by > 6 points to demonstrate decreased fall risk during functional activities. Baseline: 39/56 Goal status: INITIAL   3.  Patient will reduce timed up and go to <11 seconds to reduce fall risk and demonstrate improved transfer/gait ability. Baseline: 26.54 sec Goal status: INITIAL  4.  Patient will increase 10 meter walk test to >1.37m/s as to improve gait speed for better community ambulation and to reduce fall risk. Baseline: 15.48 sec; 0.65 m/s Goal status: INITIAL  5.  Patient will increase six minute walk test distance to >1000 for progression to community ambulator and improve gait ability Baseline:536 ft without AD.  Goal status: INITIAL    ASSESSMENT:  CLINICAL IMPRESSION: Patient presents with good motivation for completion of physical therapy activities.  Patient continues with lower extremity endurance and strength training as well as some balance training.  Patient continues to demonstrate postural abnormalities with constantly into the left.  Patient is able to temporarily correct with cues but drifts back into this posture fairly quickly.   Will continue to address impairments to improve mobility, improve balance, and improve his access to his home as well as other community settings. Pt will continue to benefit from skilled physical therapy intervention to address impairments, improve QOL, and attain therapy goals.   OBJECTIVE IMPAIRMENTS:  Abnormal gait, decreased activity tolerance, decreased balance, decreased cognition, decreased coordination, decreased endurance, decreased knowledge of use of DME, decreased mobility, difficulty walking, decreased strength, decreased safety awareness, and improper body mechanics.   ACTIVITY LIMITATIONS: carrying, lifting, bending, standing, squatting, stairs, bathing, toileting, and dressing   PARTICIPATION LIMITATIONS: meal prep, cleaning, laundry, medication management, personal finances, driving, shopping, community activity, occupation, and yard work  PERSONAL FACTORS: Behavior pattern and 3+ comorbidities:  sick sinus syndrome, vertebral artery stenosis, pacemaker implant, developmental delay disorder, hypertension, and seizure disorder  are also affecting patient's functional outcome.   REHAB POTENTIAL: Good  CLINICAL DECISION MAKING: Evolving/moderate complexity  EVALUATION COMPLEXITY: Moderate  PLAN:  PT FREQUENCY: 1-2x/week  PT DURATION: 12 weeks  PLANNED INTERVENTIONS: 97110-Therapeutic exercises, 97530- Therapeutic activity, [id number]- Neuromuscular re-education, 97535- Self Care, [id number]- Manual therapy, [phone]- Gait training, [phone]- Orthotic Fit/training, [phone]- Electrical stimulation (unattended), Patient/Family education, Balance training, Stair training, Joint mobilization, Cryotherapy, and Moist heat   PLAN FOR NEXT SESSION:   Review HEP  Postural strengthening.  LE strengthening dynamic balance and gait without AD   Note: Portions of this document were prepared using Dragon voice recognition software and although reviewed may contain unintentional dictation errors in  syntax, grammar, or spelling.  Lonni KATHEE Gainer PT ,DPT Physical Therapist- [place]  [facility]   2:53 PM 10/16/23

## 2023-10-18 ENCOUNTER — Ambulatory Visit: Admitting: Physical Therapy

## 2023-10-18 DIAGNOSIS — R2689 Other abnormalities of gait and mobility: Secondary | ICD-10-CM

## 2023-10-18 DIAGNOSIS — R278 Other lack of coordination: Secondary | ICD-10-CM

## 2023-10-18 DIAGNOSIS — R2681 Unsteadiness on feet: Secondary | ICD-10-CM

## 2023-10-18 DIAGNOSIS — R262 Difficulty in walking, not elsewhere classified: Secondary | ICD-10-CM

## 2023-10-18 DIAGNOSIS — M6281 Muscle weakness (generalized): Secondary | ICD-10-CM | POA: Diagnosis not present

## 2023-10-18 NOTE — Therapy (Signed)
 OUTPATIENT PHYSICAL THERAPY NEURO TREATMENT   [name] MRN: [id number] DOB:[birth year], 63 y.o., male Today's Date: 10/18/2023   PCP: Lizette Lango, MD  REFERRING PROVIDER: Lizette Lango, MD   END OF SESSION:  PT End of Session - 10/18/23 1329     Visit Number 4    Number of Visits 25    Date for PT Re-Evaluation 01/01/24    PT Start Time 1320    PT Stop Time 1400    PT Time Calculation (min) 40 min    Activity Tolerance Patient tolerated treatment well    Behavior During Therapy WFL for tasks assessed/performed           Past Medical History:  Diagnosis Date   Depression    Hypertension    MGUS (monoclonal gammopathy of unknown significance)    Pervasive developmental disorder    Seizures (HCC)    SSS (sick sinus syndrome) (HCC)    PPM placed   Past Surgical History:  Procedure Laterality Date   PACEMAKER IMPLANT     Patient Active Problem List   Diagnosis Date Noted   Vertebral artery stenosis 12/18/2022   Left-sided weakness 12/17/2022   MGUS (monoclonal gammopathy of unknown significance) 07/07/2021   Multifocal pneumonia 12/20/2018   Seizure disorder (HCC) 12/20/2018   COVID-19 virus infection 12/20/2018   Acute respiratory failure with hypoxia (HCC) 12/20/2018   Essential hypertension 12/20/2018   Hyponatremia 12/20/2018   Pancytopenia (HCC) 12/20/2018   Developmental delay disorder 12/20/2018   Pneumonia due to COVID-19 virus 12/20/2018   CKD (chronic kidney disease) stage 3, GFR 30-59 ml/min (HCC) 12/20/2018   Age-related nuclear cataract of both eyes 07/18/2017   Early dry stage nonexudative age-related macular degeneration of both eyes 07/18/2017   Myopia with astigmatism and presbyopia, bilateral 07/18/2017   Onychomycosis 06/29/2016   Risk for falls 02/10/2015   Pacemaker 11/06/2014   Septic shock (HCC) 11/06/2014   Dysarthria 10/31/2014   Altered mental status 10/30/2014   Thrombocytopenia (HCC) 10/08/2014   Medication  monitoring encounter 04/24/2014   Tremor 04/24/2014   Unsteadiness on feet 04/24/2014   History of nonmelanoma skin cancer 07/22/2013   Colon polyps 07/09/2013   Enuresis, primary, functional 05/09/2013   Gait disturbance 04/17/2013   Hemorrhoid 04/17/2013   Intention tremor 04/17/2013   Actinic keratosis 05/27/2010   Major depressive disorder, single episode, severe, with psychotic behavior (HCC) 03/21/2010   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   Pervasive developmental disorder 03/21/2010   Obsessive-compulsive disorder 03/07/2010   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 Pt reports that he is doing well. No pain. No other updates.  Care giver reports that he has noticed abnormality of rib cage on this day.    Pt accompanied by: caregiver  PERTINENT HISTORY:   Pt is a 63 year old male presenting to physical therapy for issues concerning balance and strength. At baseline, pt has a history of intellectual disabilities and lives in a group home. Pt is accompanied by his caretaker where they report he had a slight stroke early November 2024 where he spent a couple of days at [facility] on the third floor. According to ED notes from his stay (11/9-11/11), pt was brought in after a syncopal episode where they found no evidence of stroke on the CT, but significant  vertebral artery stenosis. Prior to this recent admission, he was living in a group home and independent with most ADL's including getting himself dressed and bathing. Since then, he now requires assistance with basic ADL's and uses a two wheeled rolling walker for ambulation. He reports his left side is now weaker since his hospital stay. In the last six months, he has had two falls with both taking place in the bathroom. His caretaker reports one fall was when he was attempting to get out of the shower and hit his head on the sink. He has had two hospital admissions over the last year both related to falls. His typical day consists of going to school at the group home and watching television. He enjoys looking at antique shops in his free time and enjoys home improvement shows.    PAIN:  Are you having pain? No  PRECAUTIONS: None  RED FLAGS: None   WEIGHT BEARING RESTRICTIONS: No  FALLS: Has patient fallen in last 6 months? No  LIVING ENVIRONMENT: Lives with: lives with their family and lives in an adult home Lives in: Group home Stairs: No Has following equipment at home: Vannie - 2 wheeled and shower chair  PLOF: Needs assistance with ADLs  PATIENT GOALS: Have better postural stability and be able to walk better.  OBJECTIVE:  Note: Objective measures were completed at Evaluation unless otherwise noted.  DIAGNOSTIC FINDINGS:   MRI HEAD WITHOUT CONTRAST- 12/18/2022   COMPARISON:  Head CT December 16, 2022.   FINDINGS: Brain: Scattered foci no acute infarction, hemorrhage, hydrocephalus, extra-axial collection or mass lesion. Scattered foci T2 hyperintensity are seen within the white matter of the cerebral hemispheres, nonspecific. Mild parenchymal volume loss.   IMPRESSION: 1. No acute intracranial abnormality. 2. Scattered foci of T2 hyperintensity within the white matter of the cerebral hemispheres, nonspecific but may represent mild chronic microvascular ischemic  changes. 3. Mild parenchymal volume loss.  COGNITION: Overall cognitive status: History of cognitive impairments - at baseline   SENSATION: WFL  COORDINATION: Pt with fair coordination, mostly having deficit due to the inability to understand the command initially, but with verbal cuing is able to perform each one with slightly slower response time from norm.  POSTURE: rounded shoulders, forward head, and increased thoracic kyphosis   LOWER EXTREMITY MMT:    MMT Right Eval Left Eval  Hip flexion 4+ 4-  Hip extension    Hip abduction 4 4  Hip adduction 4- 4-  Hip internal rotation    Hip external rotation    Knee flexion 4+ 4-  Knee extension 4+ 4-  Ankle dorsiflexion 5 5  Ankle plantarflexion    Ankle inversion    Ankle  eversion    (Blank rows = not tested)   STAIRS: Findings: Level of Assistance: Modified independence, Stair Negotiation Technique: Step to Pattern Alternating Pattern  with Bilateral Rails, Number of Stairs: 4, Height of Stairs: 6   , and Comments: Pt with use of bilateral hand rails ascending/descending.  Pt utilized alternating gait pattern ascending, step-to pattern descending  GAIT: Findings:  Pt ambulates without the use of AD, however has reduced stride length during ambulation.  Pt with L lateral lean when ambulating and reduced foot clearance bilaterally.  Pt displays trendelenburg on the R  FUNCTIONAL TESTS:  5 times sit to stand: 38.07 sec with UE on handrails Timed up and go (TUG): 26.54 sec 6 minute walk test: TBD 10 meter walk test: 15.48 sec; 0.65 m/s Berg Balance Scale: 39/56     [facility] PT Assessment - 10/11/23 0001       Berg Balance Test   Sit to Stand Able to stand without using hands and stabilize independently    Standing Unsupported Able to stand safely 2 minutes    Sitting with Back Unsupported but Feet Supported on Floor or Stool Able to sit safely and securely 2 minutes    Stand to Sit Sits safely with minimal use of  hands    Transfers Able to transfer safely, minor use of hands    Standing Unsupported with Eyes Closed Able to stand 10 seconds with supervision    Standing Unsupported with Feet Together Able to place feet together independently and stand for 1 minute with supervision    From Standing, Reach Forward with Outstretched Arm Can reach forward >5 cm safely (2)    From Standing Position, Pick up Object from Floor Able to pick up shoe, needs supervision    From Standing Position, Turn to Look Behind Over each Shoulder Turn sideways only but maintains balance    Turn 360 Degrees Able to turn 360 degrees safely but slowly    Standing Unsupported, Alternately Place Feet on Step/Stool Able to complete >2 steps/needs minimal assist    Standing Unsupported, One Foot in Front Able to take small step independently and hold 30 seconds    Standing on One Leg Tries to lift leg/unable to hold 3 seconds but remains standing independently    Total Score 39                                                                                                                                      TREATMENT DATE: 10/18/23  Gait without resistance x 150 with supervision assist from PT.   Nustep hill program x 10 min,  Level 2-6 , for B UE and LE reciprocal movement Training with instruction throughout for improved postural alignment to prevent L side bias and increased ROM for RLE knee L elbow extension.  Seated row with 12.5 # 3 x 10   Gait with 5# DB in the  LUE with instruction to maintain neutral posture or lean to the R side 161ft x 2.   Seated HS curl 7.5# x 12 bil   Sit<>stand with overhead/cross body reach 2 x 6 with CGA due to 1 mild LOB to the R, requiring UE support on PT to prevent fall.   PATIENT EDUCATION: Education details: Pt educated on role of PT and services provided during current POC, along with prognosis and information about the clinic. Person educated: Patient and Caregiver   Education  method: Explanation Education comprehension: verbalized understanding  HOME EXERCISE PROGRAM: Access Code: [email or web address] URL: [email or web address] Date: 10/11/2023 Prepared by: Massie Dollar  Exercises - Standing March with Counter Support  - 1 x daily - 5 x weekly - 3 sets - 10 reps - 2 hold - Standing Knee Flexion with Unilateral Counter Support  - 1 x daily - 5 x weekly - 3 sets - 10 reps - 2 hold - Sit to Stand with Armchair  - 1 x daily - 5 x weekly - 3 sets - 5 reps - Side Stepping with Counter Support  - 1 x daily - 5 x weekly - 3 sets - 5 reps  GOALS: Goals reviewed with patient? Yes  SHORT TERM GOALS: Target date: 11/06/2023  Pt will be independent with HEP in order to demonstrate increased ability to perform tasks related to occupation/hobbies. Baseline: to be given at the next visit Goal status: INITIAL    LONG TERM GOALS: Target date: 01/01/2024  1.  Patient (> 53 years old) will complete five times sit to stand test in <24 seconds indicating an increased LE strength and improved balance. Baseline: 38.07 sec Goal status: INITIAL   2.  Patient will increase Berg Balance score by > 6 points to demonstrate decreased fall risk during functional activities. Baseline: 39/56 Goal status: INITIAL   3.  Patient will reduce timed up and go to <11 seconds to reduce fall risk and demonstrate improved transfer/gait ability. Baseline: 26.54 sec Goal status: INITIAL  4.  Patient will increase 10 meter walk test to >1.60m/s as to improve gait speed for better community ambulation and to reduce fall risk. Baseline: 15.48 sec; 0.65 m/s Goal status: INITIAL  5.  Patient will increase six minute walk test distance to >1000 for progression to community ambulator and improve gait ability Baseline:536 ft without AD.  Goal status: INITIAL    ASSESSMENT:  CLINICAL IMPRESSION: Patient presents with good motivation for completion of physical therapy activities.  Patient  continues with lower extremity endurance and strength training as well as some balance training. As well as PT focus on improved postural awareness and strength to reduce lateral trunk lean to the R.  Pt will continue to benefit from skilled physical therapy intervention to address impairments, improve QOL, and attain therapy goals.   OBJECTIVE IMPAIRMENTS:  Abnormal gait, decreased activity tolerance, decreased balance, decreased cognition, decreased coordination, decreased endurance, decreased knowledge of use of DME, decreased mobility, difficulty walking, decreased strength, decreased safety awareness, and improper body mechanics.   ACTIVITY LIMITATIONS: carrying, lifting, bending, standing, squatting, stairs, bathing, toileting, and dressing   PARTICIPATION LIMITATIONS: meal prep, cleaning, laundry, medication management, personal finances, driving, shopping, community activity, occupation, and yard work  PERSONAL FACTORS: Behavior pattern and 3+ comorbidities:  sick sinus syndrome, vertebral artery stenosis, pacemaker implant, developmental delay disorder, hypertension, and seizure disorder  are also affecting patient's functional outcome.   REHAB POTENTIAL: Good  CLINICAL DECISION MAKING: Evolving/moderate complexity  EVALUATION COMPLEXITY: Moderate  PLAN:  PT FREQUENCY: 1-2x/week  PT DURATION: 12 weeks  PLANNED INTERVENTIONS: 97110-Therapeutic exercises, 97530- Therapeutic activity, [id number]- Neuromuscular re-education, 97535- Self Care, [id number]- Manual therapy, [phone]- Gait training, [phone]- Orthotic Fit/training, [phone]- Electrical stimulation (unattended), Patient/Family education, Balance training, Stair training, Joint mobilization, Cryotherapy, and Moist heat   PLAN FOR NEXT SESSION:   Review HEP  Postural strengthening.  LE strengthening dynamic balance and gait without AD   Massie FORBES Dollar PT ,DPT Physical Therapist- [facility]   1:30  PM 10/18/23

## 2023-10-22 ENCOUNTER — Ambulatory Visit

## 2023-10-25 ENCOUNTER — Ambulatory Visit

## 2023-10-25 DIAGNOSIS — R262 Difficulty in walking, not elsewhere classified: Secondary | ICD-10-CM

## 2023-10-25 DIAGNOSIS — M6281 Muscle weakness (generalized): Secondary | ICD-10-CM | POA: Diagnosis not present

## 2023-10-25 DIAGNOSIS — R2689 Other abnormalities of gait and mobility: Secondary | ICD-10-CM

## 2023-10-25 DIAGNOSIS — R2681 Unsteadiness on feet: Secondary | ICD-10-CM

## 2023-10-25 NOTE — Therapy (Signed)
 OUTPATIENT PHYSICAL THERAPY TREATMENT   [name] MRN: [id number] DOB:1961/01/21, 63 y.o., male Today's Date: 10/25/2023   PCP: Lizette Lango, MD  REFERRING PROVIDER: Lizette Lango, MD   END OF SESSION:  PT End of Session - 10/25/23 1155     Visit Number 5    Number of Visits 25    Date for Recertification  01/01/24    Authorization Type Medicare and Medicaid    Progress Note Due on Visit 10    PT Start Time 1150    PT Stop Time 1230    PT Time Calculation (min) 40 min    Equipment Utilized During Treatment Gait belt    Activity Tolerance Patient tolerated treatment well;No increased pain    Behavior During Therapy WFL for tasks assessed/performed           Past Medical History:  Diagnosis Date   Depression    Hypertension    MGUS (monoclonal gammopathy of unknown significance)    Pervasive developmental disorder    Seizures (HCC)    SSS (sick sinus syndrome) (HCC)    PPM placed   Past Surgical History:  Procedure Laterality Date   PACEMAKER IMPLANT     Patient Active Problem List   Diagnosis Date Noted   Vertebral artery stenosis 12/18/2022   Left-sided weakness 12/17/2022   MGUS (monoclonal gammopathy of unknown significance) 07/07/2021   Multifocal pneumonia 12/20/2018   Seizure disorder (HCC) 12/20/2018   COVID-19 virus infection 12/20/2018   Acute respiratory failure with hypoxia (HCC) 12/20/2018   Essential hypertension 12/20/2018   Hyponatremia 12/20/2018   Pancytopenia (HCC) 12/20/2018   Developmental delay disorder 12/20/2018   Pneumonia due to COVID-19 virus 12/20/2018   CKD (chronic kidney disease) stage 3, GFR 30-59 ml/min (HCC) 12/20/2018   Age-related nuclear cataract of both eyes 07/18/2017   Early dry stage nonexudative age-related macular degeneration of both eyes 07/18/2017   Myopia with astigmatism and presbyopia, bilateral 07/18/2017   Onychomycosis 06/29/2016   Risk for falls 02/10/2015   Pacemaker 11/06/2014   Septic  shock (HCC) 11/06/2014   Dysarthria 10/31/2014   Altered mental status 10/30/2014   Thrombocytopenia (HCC) 10/08/2014   Medication monitoring encounter 04/24/2014   Tremor 04/24/2014   Unsteadiness on feet 04/24/2014   History of nonmelanoma skin cancer 07/22/2013   Colon polyps 07/09/2013   Enuresis, primary, functional 05/09/2013   Gait disturbance 04/17/2013   Hemorrhoid 04/17/2013   Intention tremor 04/17/2013   Actinic keratosis 05/27/2010   Major depressive disorder, single episode, severe, with psychotic behavior (HCC) 03/21/2010   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   Pervasive developmental disorder 03/21/2010   Obsessive-compulsive disorder 03/07/2010   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 Pt reports that he is doing well. No pain. No other updates.  Care giver reports that he has noticed abnormality of rib cage on this day.    Pt accompanied by: caregiver  PERTINENT HISTORY:   Pt is a 63 year old male presenting to physical therapy for issues concerning balance and strength. At baseline, pt has a history of intellectual disabilities and lives in a group home. Pt is accompanied by his caretaker where they report he had a slight stroke early November 2024 where he spent a couple of days at [facility] on the third floor. According to ED notes from his stay (11/9-11/11), pt was  brought in after a syncopal episode where they found no evidence of stroke on the CT, but significant vertebral artery stenosis. Prior to this recent admission, he was living in a group home and independent with most ADL's including getting himself dressed and bathing. Since then, he now requires assistance with basic ADL's and uses a two wheeled rolling walker for ambulation. He reports his left side is now weaker since his hospital stay. In the last six months, he has had two falls with both taking place in the bathroom. His caretaker reports one fall was when he was attempting to get out of the shower and hit his head on the sink. He has had two hospital admissions over the last year both related to falls. His typical day consists of going to school at the group home and watching television. He enjoys looking at antique shops in his free time and enjoys home improvement shows.    PAIN:  Are you having pain? No  PRECAUTIONS: None  RED FLAGS: None   WEIGHT BEARING RESTRICTIONS: No  FALLS: Has patient fallen in last 6 months? No  LIVING ENVIRONMENT: Lives with: lives with their family and lives in an adult home Lives in: Group home Stairs: No Has following equipment at home: Vannie - 2 wheeled and shower chair  PLOF: Needs assistance with ADLs  PATIENT GOALS: Have better postural stability and be able to walk better.  OBJECTIVE:  Note: Objective measures were completed at Evaluation unless otherwise noted.  DIAGNOSTIC FINDINGS:   MRI HEAD WITHOUT CONTRAST- 12/18/2022   COMPARISON:  Head CT December 16, 2022.   FINDINGS: Brain: Scattered foci no acute infarction, hemorrhage, hydrocephalus, extra-axial collection or mass lesion. Scattered foci T2 hyperintensity are seen within the white matter of the cerebral hemispheres, nonspecific. Mild parenchymal volume loss.   IMPRESSION: 1. No acute intracranial abnormality. 2. Scattered foci of T2 hyperintensity within the white  matter of the cerebral hemispheres, nonspecific but may represent mild chronic microvascular ischemic changes. 3. Mild parenchymal volume loss.  COGNITION: Overall cognitive status: History of cognitive impairments - at baseline   SENSATION: WFL  COORDINATION: Pt with fair coordination, mostly having deficit due to the inability to understand the command initially, but with verbal cuing is able to perform each one with slightly slower response time from norm.  POSTURE: rounded shoulders, forward head, and increased thoracic kyphosis   LOWER EXTREMITY MMT:    MMT Right Eval Left Eval  Hip flexion 4+ 4-  Hip extension    Hip abduction 4 4  Hip adduction 4- 4-  Hip internal rotation    Hip external rotation    Knee flexion 4+ 4-  Knee extension 4+ 4-  Ankle dorsiflexion 5 5  Ankle plantarflexion    Ankle inversion    Ankle  eversion    (Blank rows = not tested)   STAIRS: Findings: Level of Assistance: Modified independence, Stair Negotiation Technique: Step to Pattern Alternating Pattern  with Bilateral Rails, Number of Stairs: 4, Height of Stairs: 6   , and Comments: Pt with use of bilateral hand rails ascending/descending.  Pt utilized alternating gait pattern ascending, step-to pattern descending  GAIT: Findings:  Pt ambulates without the use of AD, however has reduced stride length during ambulation.  Pt with L lateral lean when ambulating and reduced foot clearance bilaterally.  Pt displays trendelenburg on the R  FUNCTIONAL TESTS:  5 times sit to stand: 38.07 sec with UE on handrails Timed up and go (TUG): 26.54 sec 6 minute walk test: TBD 10 meter walk test: 15.48 sec; 0.65 m/s Berg Balance Scale: 39/56     [facility] PT Assessment - 10/11/23 0001       Berg Balance Test   Sit to Stand Able to stand without using hands and stabilize independently    Standing Unsupported Able to stand safely 2 minutes    Sitting with Back Unsupported but Feet Supported on Floor  or Stool Able to sit safely and securely 2 minutes    Stand to Sit Sits safely with minimal use of hands    Transfers Able to transfer safely, minor use of hands    Standing Unsupported with Eyes Closed Able to stand 10 seconds with supervision    Standing Unsupported with Feet Together Able to place feet together independently and stand for 1 minute with supervision    From Standing, Reach Forward with Outstretched Arm Can reach forward >5 cm safely (2)    From Standing Position, Pick up Object from Floor Able to pick up shoe, needs supervision    From Standing Position, Turn to Look Behind Over each Shoulder Turn sideways only but maintains balance    Turn 360 Degrees Able to turn 360 degrees safely but slowly    Standing Unsupported, Alternately Place Feet on Step/Stool Able to complete >2 steps/needs minimal assist    Standing Unsupported, One Foot in Front Able to take small step independently and hold 30 seconds    Standing on One Leg Tries to lift leg/unable to hold 3 seconds but remains standing independently    Total Score 39                                                                                                                                      TREATMENT DATE: 10/25/23  Nustep hill seat 8, arms 8, level 2 x 2 minutes, level 3 x 3 minutes; close assist to safely mount, ismount  Seated row with 17.5# 1x15   STS from chair x10, minguardA LUE push of chair  Seated HS curl 7.5# x 12 bil  Seated row with 17.5# 1x20   STS from chair x10, minguardA LUE push of chair  Seated HS curl  7.5# x 12 bil  Seated ball toss catch with elastic postural resistance to Left side to promote corrective Left lean x20    363ft AMB overground with Left wrist weight @ 4lb   125ft overground with LUE 15lb KB carry (partial correction of Left lateral lean)   PATIENT EDUCATION: Education details: Pt educated on role of PT and services provided during current POC, along with prognosis and  information about the clinic. Person educated: Patient and Caregiver   Education method: Explanation Education comprehension: verbalized understanding  HOME EXERCISE PROGRAM: Access Code: [email or web address] URL: [email or web address] Date: 10/11/2023 Prepared by: Massie Dollar  Exercises - Standing March with Counter Support  - 1 x daily - 5 x weekly - 3 sets - 10 reps - 2 hold - Standing Knee Flexion with Unilateral Counter Support  - 1 x daily - 5 x weekly - 3 sets - 10 reps - 2 hold - Sit to Stand with Armchair  - 1 x daily - 5 x weekly - 3 sets - 5 reps - Side Stepping with Counter Support  - 1 x daily - 5 x weekly - 3 sets - 5 reps  GOALS: Goals reviewed with patient? Yes  SHORT TERM GOALS: Target date: 11/06/2023  Pt will be independent with HEP in order to demonstrate increased ability to perform tasks related to occupation/hobbies. Baseline: to be given at the next visit Goal status: INITIAL   LONG TERM GOALS: Target date: 01/01/2024  1.  Patient (> 46 years old) will complete five times sit to stand test in <24 seconds indicating an increased LE strength and improved balance. Baseline: 38.07 sec Goal status: INITIAL   2.  Patient will increase Berg Balance score by > 6 points to demonstrate decreased fall risk during functional activities. Baseline: 39/56 Goal status: INITIAL   3.  Patient will reduce timed up and go to <11 seconds to reduce fall risk and demonstrate improved transfer/gait ability. Baseline: 26.54 sec Goal status: INITIAL  4.  Patient will increase 10 meter walk test to >1.74m/s as to improve gait speed for better community ambulation and to reduce fall risk. Baseline: 15.48 sec; 0.65 m/s Goal status: INITIAL  5.  Patient will increase six minute walk test distance to >1000 for progression to community ambulator and improve gait ability Baseline:536 ft without AD.  Goal status: INITIAL    ASSESSMENT:  CLINICAL IMPRESSION: Continued  with core and gneeral strnegthenin interventions with other activities to improv eovergroundAMB tolerance and stability. Pt tolerates session well overall, no pain, moderate to low fatigue. Pt will continue to benefit from skilled physical therapy intervention to address impairments, improve QOL, and attain therapy goals.   OBJECTIVE IMPAIRMENTS:  Abnormal gait, decreased activity tolerance, decreased balance, decreased cognition, decreased coordination, decreased endurance, decreased knowledge of use of DME, decreased mobility, difficulty walking, decreased strength, decreased safety awareness, and improper body mechanics.   ACTIVITY LIMITATIONS: carrying, lifting, bending, standing, squatting, stairs, bathing, toileting, and dressing   PARTICIPATION LIMITATIONS: meal prep, cleaning, laundry, medication management, personal finances, driving, shopping, community activity, occupation, and yard work  PERSONAL FACTORS: Behavior pattern and 3+ comorbidities:  sick sinus syndrome, vertebral artery stenosis, pacemaker implant, developmental delay disorder, hypertension, and seizure disorder  are also affecting patient's functional outcome.   REHAB POTENTIAL: Good  CLINICAL DECISION MAKING: Evolving/moderate complexity  EVALUATION COMPLEXITY: Moderate  PLAN:  PT FREQUENCY: 1-2x/week  PT DURATION: 12 weeks  PLANNED INTERVENTIONS: 97110-Therapeutic exercises, 97530- Therapeutic activity, [id number]- Neuromuscular re-education, 97535- Self Care,  [id number]- Manual therapy, [id number]- Gait training, [id number]- Orthotic Fit/training, [id number]- Electrical stimulation (unattended), Patient/Family education, Balance training, Stair training, Joint mobilization, Cryotherapy, and Moist heat   PLAN FOR NEXT SESSION:  Postural strengthening.  LE strengthening dynamic balance and gait without AD   11:58 AM, 10/25/23 Peggye JAYSON Linear, PT, DPT Physical Therapist - [place] [facility]  Outpatient  Physical Therapy- Main Campus [phone]

## 2023-10-30 ENCOUNTER — Ambulatory Visit

## 2023-10-30 DIAGNOSIS — R262 Difficulty in walking, not elsewhere classified: Secondary | ICD-10-CM

## 2023-10-30 DIAGNOSIS — R2681 Unsteadiness on feet: Secondary | ICD-10-CM

## 2023-10-30 DIAGNOSIS — M6281 Muscle weakness (generalized): Secondary | ICD-10-CM | POA: Diagnosis not present

## 2023-10-30 DIAGNOSIS — R2689 Other abnormalities of gait and mobility: Secondary | ICD-10-CM

## 2023-10-30 NOTE — Therapy (Signed)
 OUTPATIENT PHYSICAL THERAPY TREATMENT   [name] MRN: [id number] DOB:[birth year], 63 y.o., male Today's Date: 10/30/2023   PCP: Lizette Lango, MD  REFERRING PROVIDER: Lizette Lango, MD   END OF SESSION:  PT End of Session - 10/30/23 0810     Visit Number 6    Number of Visits 25    Date for Recertification  01/01/24    Authorization Type Medicare and Medicaid    Progress Note Due on Visit 10    PT Start Time 0805    PT Stop Time 0845    PT Time Calculation (min) 40 min    Equipment Utilized During Treatment Gait belt    Activity Tolerance Patient tolerated treatment well;No increased pain           Past Medical History:  Diagnosis Date   Depression    Hypertension    MGUS (monoclonal gammopathy of unknown significance)    Pervasive developmental disorder    Seizures (HCC)    SSS (sick sinus syndrome) (HCC)    PPM placed   Past Surgical History:  Procedure Laterality Date   PACEMAKER IMPLANT     Patient Active Problem List   Diagnosis Date Noted   Vertebral artery stenosis 12/18/2022   Left-sided weakness 12/17/2022   MGUS (monoclonal gammopathy of unknown significance) 07/07/2021   Multifocal pneumonia 12/20/2018   Seizure disorder (HCC) 12/20/2018   COVID-19 virus infection 12/20/2018   Acute respiratory failure with hypoxia (HCC) 12/20/2018   Essential hypertension 12/20/2018   Hyponatremia 12/20/2018   Pancytopenia (HCC) 12/20/2018   Developmental delay disorder 12/20/2018   Pneumonia due to COVID-19 virus 12/20/2018   CKD (chronic kidney disease) stage 3, GFR 30-59 ml/min (HCC) 12/20/2018   Age-related nuclear cataract of both eyes 07/18/2017   Early dry stage nonexudative age-related macular degeneration of both eyes 07/18/2017   Myopia with astigmatism and presbyopia, bilateral 07/18/2017   Onychomycosis 06/29/2016   Risk for falls 02/10/2015   Pacemaker 11/06/2014   Septic shock (HCC) 11/06/2014   Dysarthria 10/31/2014   Altered  mental status 10/30/2014   Thrombocytopenia 10/08/2014   Medication monitoring encounter 04/24/2014   Tremor 04/24/2014   Unsteadiness on feet 04/24/2014   History of nonmelanoma skin cancer 07/22/2013   Colon polyps 07/09/2013   Enuresis, primary, functional 05/09/2013   Gait disturbance 04/17/2013   Hemorrhoid 04/17/2013   Intention tremor 04/17/2013   Actinic keratosis 05/27/2010   Major depressive disorder, single episode, severe, with psychotic behavior (HCC) 03/21/2010   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   Pervasive developmental disorder 03/21/2010   Obsessive-compulsive disorder 03/07/2010   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 No pain today.   Pt accompanied by: caregiver  PERTINENT HISTORY:  Pt is a 63 year old male presenting to physical therapy for issues concerning balance and strength. At baseline, pt has a history of intellectual disabilities and lives in a group home. Pt is accompanied by his caretaker where they report he had a slight stroke early November 2024 where he spent a couple of days at [facility] on the third floor. According to ED notes from his stay (11/9-11/11), pt was brought in after a syncopal episode where they found no evidence of stroke on the CT, but significant vertebral artery stenosis. Prior to this recent admission, he was living in a group home  and independent with most ADL's including getting himself dressed and bathing. Since then, he now requires assistance with basic ADL's and uses a two wheeled rolling walker for ambulation. He reports his left side is now weaker since his hospital stay. In the last six months, he has had two falls with both taking place in the bathroom. His caretaker reports one fall was when he was attempting to get out of the shower and hit his head on the sink. He has had two hospital admissions over the last year both related to falls. His typical day consists of going to school at the group home and watching television. He enjoys looking at antique shops in his free time and enjoys home improvement shows.   PAIN:  Are you having pain? No  PRECAUTIONS: None  RED FLAGS: None   WEIGHT BEARING RESTRICTIONS: No  FALLS: Has patient fallen in last 6 months? No  LIVING ENVIRONMENT: Lives with: lives with their family and lives in an adult home Lives in: Group home Stairs: No Has following equipment at home: Vannie - 2 wheeled and shower chair  PLOF: Needs assistance with ADLs  PATIENT GOALS: Have better postural stability and be able to walk better.  OBJECTIVE:  Note: Objective measures were completed at Evaluation unless otherwise noted.  DIAGNOSTIC FINDINGS:   MRI HEAD WITHOUT CONTRAST- 12/18/2022   COMPARISON:  Head CT December 16, 2022.   FINDINGS: Brain: Scattered foci no acute infarction, hemorrhage, hydrocephalus, extra-axial collection or mass lesion. Scattered foci T2 hyperintensity are seen within the white matter of the cerebral hemispheres, nonspecific. Mild parenchymal volume loss.   IMPRESSION: 1. No acute intracranial abnormality. 2. Scattered foci of T2 hyperintensity within the white matter of the cerebral hemispheres, nonspecific but may represent mild chronic microvascular ischemic changes. 3. Mild parenchymal volume loss.  COGNITION: Overall cognitive status: History of  cognitive impairments - at baseline   SENSATION: WFL  COORDINATION: Pt with fair coordination, mostly having deficit due to the inability to understand the command initially, but with verbal cuing is able to perform each one with slightly slower response time from norm.  POSTURE: rounded shoulders, forward head, and increased thoracic kyphosis   LOWER EXTREMITY MMT:    MMT Right Eval Left Eval  Hip flexion 4+ 4-  Hip extension    Hip abduction 4 4  Hip adduction 4- 4-  Hip internal rotation    Hip external rotation    Knee flexion 4+ 4-  Knee extension 4+ 4-  Ankle dorsiflexion 5 5  Ankle plantarflexion    Ankle inversion    Ankle eversion    (Blank rows = not tested)   STAIRS: Findings: Level of Assistance: Modified independence, Stair Negotiation Technique: Step to Pattern Alternating Pattern  with Bilateral Rails, Number of Stairs: 4, Height of Stairs: 6   , and Comments: Pt with use of bilateral hand rails ascending/descending.  Pt utilized alternating gait pattern ascending, step-to pattern descending  GAIT: Findings:  Pt ambulates without the use of AD, however has reduced stride length during ambulation.  Pt with L lateral lean when ambulating and reduced foot clearance bilaterally.  Pt displays trendelenburg on the R  FUNCTIONAL TESTS:  5 times sit to stand: 38.07 sec with UE on handrails Timed up and go (TUG): 26.54 sec 6 minute walk test: TBD 10 meter walk test: 15.48 sec; 0.65 m/s Berg Balance Scale: 39/56     [facility] PT Assessment - 10/11/23 0001       Berg Balance Test   Sit to Stand Able to stand without using hands and stabilize independently    Standing Unsupported Able to stand safely 2 minutes    Sitting with Back Unsupported but Feet Supported on Floor or Stool Able to sit safely and securely 2 minutes    Stand to Sit Sits safely with minimal use of hands    Transfers Able to transfer safely, minor use of hands    Standing Unsupported with Eyes  Closed Able to stand 10 seconds with supervision    Standing Unsupported with Feet Together Able to place feet together independently and stand for 1 minute with supervision    From Standing, Reach Forward with Outstretched Arm Can reach forward >5 cm safely (2)    From Standing Position, Pick up Object from Floor Able to pick up shoe, needs supervision    From Standing Position, Turn to Look Behind Over each Shoulder Turn sideways only but maintains balance    Turn 360 Degrees Able to turn 360 degrees safely but slowly    Standing Unsupported, Alternately Place Feet on Step/Stool Able to complete >2 steps/needs minimal assist    Standing Unsupported, One Foot in Front Able to take small step independently and hold 30 seconds    Standing on One Leg Tries to lift leg/unable to hold 3 seconds but remains standing independently    Total Score 39                                                                                                                                     TREATMENT DATE: 10/30/23 Nustep hill seat 8, arms 8, level 2 x 2 minutes, level 3 x 5 minutes; close assist to safely mount, dismount, cues to maintain amplitude  Seated row with 17.5# 1x15  (unable to keep hand position despite cues)  STS from chair x10, minguardA LUE push of chair  Seated lateral flexion 15lb KB lift x10 bilat  Overhead press with OMEGA staight bar x10 (limited range)  Seated row with 17.5# 1x15  (unable to kepe hand position despite cues)  STS from chair x10, minguardA LUE push of chair  Seated lateral flexion  15lb KB lift x10 bilat  Overhead press with OMEGA staight bar x10 (limited range)  Lateral stepping in // bars with 2lb AW bilat 5RT    324ft AMB overground with Left wrist weight @ 4lb, and 2lb AW bilat, minGuardA  2:48 347ft AMB overground with Left wrist weight @ 4lb, and 2lb AW bilat, minGuardA  2:48  PATIENT EDUCATION: Education details: extensive cues for exercise form, tactile,  verbal, and visual. Person educated: Patient and Caregiver   Education method: Explanation Education comprehension: verbalized understanding  HOME EXERCISE PROGRAM: Access Code: [email or web address] URL: [email or web address] Date: 10/11/2023 Prepared by: Massie Dollar  Exercises - Standing March with Counter Support  - 1 x daily - 5 x weekly - 3 sets - 10 reps - 2 hold - Standing Knee Flexion with Unilateral Counter Support  - 1 x daily - 5 x weekly - 3 sets - 10 reps - 2 hold - Sit to Stand with Armchair  - 1 x daily - 5 x weekly - 3 sets - 5 reps - Side Stepping with Counter Support  - 1 x daily - 5 x weekly - 3 sets - 5 reps  GOALS: Goals reviewed with patient? Yes  SHORT TERM GOALS: Target date: 11/06/2023  Pt will be independent with HEP in order to demonstrate increased ability to perform tasks related to occupation/hobbies. Baseline: to be given at the next visit Goal status: INITIAL  LONG TERM GOALS: Target date: 01/01/2024  1.  Patient (> 16 years old) will complete five times sit to stand test in <24 seconds indicating an increased LE strength and improved balance. Baseline: 38.07 sec Goal status: INITIAL   2.  Patient will increase Berg Balance score by > 6 points to demonstrate decreased fall risk during functional activities. Baseline: 39/56 Goal status: INITIAL   3.  Patient will reduce timed up and go to <11 seconds to reduce fall risk and demonstrate improved transfer/gait ability. Baseline: 26.54 sec Goal status: INITIAL  4.  Patient will increase 10 meter walk test to >1.5m/s as to improve gait speed for better community ambulation and to reduce fall risk. Baseline: 15.48 sec; 0.65 m/s Goal status: INITIAL  5.  Patient will increase six minute walk test distance to >1000 for progression to community ambulator and improve gait ability Baseline:536 ft without AD.  Goal status: INITIAL   ASSESSMENT:  CLINICAL IMPRESSION: Continued with core and  gneeral strnegthenin interventions with other activities to improv eovergroundAMB tolerance and stability. Incorporated more frontal plane resistance loading to improve postural awareness and proprioception. Pt tolerates session well overall, no pain, moderate to low fatigue. Pt will continue to benefit from skilled physical therapy intervention to address impairments, improve QOL, and attain therapy goals.   OBJECTIVE IMPAIRMENTS:  Abnormal gait, decreased activity tolerance, decreased balance, decreased cognition, decreased coordination, decreased endurance, decreased knowledge of use of DME, decreased mobility, difficulty walking, decreased strength, decreased safety awareness, and improper body mechanics.   ACTIVITY LIMITATIONS: carrying, lifting, bending, standing, squatting, stairs, bathing, toileting, and dressing   PARTICIPATION LIMITATIONS: meal prep, cleaning, laundry, medication management, personal finances, driving, shopping, community activity, occupation, and yard work  PERSONAL FACTORS: Behavior pattern and 3+ comorbidities:  sick sinus syndrome, vertebral artery stenosis, pacemaker implant, developmental delay disorder, hypertension, and seizure disorder  are also affecting patient's functional outcome.   REHAB POTENTIAL: Good  CLINICAL DECISION MAKING: Evolving/moderate complexity  EVALUATION COMPLEXITY: Moderate  PLAN:  PT FREQUENCY: 1-2x/week  PT DURATION: 12 weeks  PLANNED INTERVENTIONS: 97110-Therapeutic  exercises, 97530- Therapeutic activity, [id number]- Neuromuscular re-education, [phone]- Self Care, 02859- Manual therapy, [phone]- Gait training, [phone]- Orthotic Fit/training, [phone]- Electrical stimulation (unattended), Patient/Family education, Balance training, Stair training, Joint mobilization, Cryotherapy, and Moist heat   PLAN FOR NEXT SESSION:  Postural strengthening.  LE strengthening dynamic balance and gait without AD   8:12 AM, 10/30/23 Peggye Conley Linear, PT,  DPT Physical Therapist - [facility]  Outpatient Physical Therapy- Main Campus [phone]

## 2023-11-01 ENCOUNTER — Ambulatory Visit: Admitting: Physical Therapy

## 2023-11-01 DIAGNOSIS — R2681 Unsteadiness on feet: Secondary | ICD-10-CM

## 2023-11-01 DIAGNOSIS — R2689 Other abnormalities of gait and mobility: Secondary | ICD-10-CM

## 2023-11-01 DIAGNOSIS — R278 Other lack of coordination: Secondary | ICD-10-CM

## 2023-11-01 DIAGNOSIS — M6281 Muscle weakness (generalized): Secondary | ICD-10-CM

## 2023-11-01 DIAGNOSIS — R262 Difficulty in walking, not elsewhere classified: Secondary | ICD-10-CM

## 2023-11-01 NOTE — Therapy (Signed)
 OUTPATIENT PHYSICAL THERAPY TREATMENT   [name] MRN: [id number] DOB:[birth year], 63 y.o., male Today's Date: 11/01/2023   PCP: Lizette Lango, MD  REFERRING PROVIDER: Lizette Lango, MD   END OF SESSION:  PT End of Session - 11/01/23 1104     Visit Number 7    Number of Visits 25    Date for Recertification  01/01/24    Authorization Type Medicare and Medicaid    Progress Note Due on Visit 10    PT Start Time 1104    PT Stop Time 1145    PT Time Calculation (min) 41 min    Equipment Utilized During Treatment Gait belt    Activity Tolerance Patient tolerated treatment well;No increased pain           Past Medical History:  Diagnosis Date   Depression    Hypertension    MGUS (monoclonal gammopathy of unknown significance)    Pervasive developmental disorder    Seizures (HCC)    SSS (sick sinus syndrome) (HCC)    PPM placed   Past Surgical History:  Procedure Laterality Date   PACEMAKER IMPLANT     Patient Active Problem List   Diagnosis Date Noted   Vertebral artery stenosis 12/18/2022   Left-sided weakness 12/17/2022   MGUS (monoclonal gammopathy of unknown significance) 07/07/2021   Multifocal pneumonia 12/20/2018   Seizure disorder (HCC) 12/20/2018   COVID-19 virus infection 12/20/2018   Acute respiratory failure with hypoxia (HCC) 12/20/2018   Essential hypertension 12/20/2018   Hyponatremia 12/20/2018   Pancytopenia (HCC) 12/20/2018   Developmental delay disorder 12/20/2018   Pneumonia due to COVID-19 virus 12/20/2018   CKD (chronic kidney disease) stage 3, GFR 30-59 ml/min (HCC) 12/20/2018   Age-related nuclear cataract of both eyes 07/18/2017   Early dry stage nonexudative age-related macular degeneration of both eyes 07/18/2017   Myopia with astigmatism and presbyopia, bilateral 07/18/2017   Onychomycosis 06/29/2016   Risk for falls 02/10/2015   Pacemaker 11/06/2014   Septic shock (HCC) 11/06/2014   Dysarthria 10/31/2014   Altered  mental status 10/30/2014   Thrombocytopenia 10/08/2014   Medication monitoring encounter 04/24/2014   Tremor 04/24/2014   Unsteadiness on feet 04/24/2014   History of nonmelanoma skin cancer 07/22/2013   Colon polyps 07/09/2013   Enuresis, primary, functional 05/09/2013   Gait disturbance 04/17/2013   Hemorrhoid 04/17/2013   Intention tremor 04/17/2013   Actinic keratosis 05/27/2010   Major depressive disorder, single episode, severe, with psychotic behavior (HCC) 03/21/2010   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   Pervasive developmental disorder 03/21/2010   Obsessive-compulsive disorder 03/07/2010   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 No pain reported by PT at start of session. Caregiver does not come back with pt on this day, but requesting after visit summary.   Pt accompanied by: caregiver  PERTINENT HISTORY:  Pt is a 63 year old male presenting to physical therapy for issues concerning balance and strength. At baseline, pt has a history of intellectual disabilities and lives in a group home. Pt is accompanied by his caretaker where they report he had a slight stroke early November 2024 where he spent a couple of days at [facility] on the third floor. According to ED notes from his stay (11/9-11/11), pt was brought in after a  syncopal episode where they found no evidence of stroke on the CT, but significant vertebral artery stenosis. Prior to this recent admission, he was living in a group home and independent with most ADL's including getting himself dressed and bathing. Since then, he now requires assistance with basic ADL's and uses a two wheeled rolling walker for ambulation. He reports his left side is now weaker since his hospital stay. In the last six months, he has had two falls with both taking place in the bathroom. His caretaker reports one fall was when he was attempting to get out of the shower and hit his head on the sink. He has had two hospital admissions over the last year both related to falls. His typical day consists of going to school at the group home and watching television. He enjoys looking at antique shops in his free time and enjoys home improvement shows.   PAIN:  Are you having pain? No  PRECAUTIONS: None  RED FLAGS: None   WEIGHT BEARING RESTRICTIONS: No  FALLS: Has patient fallen in last 6 months? No  LIVING ENVIRONMENT: Lives with: lives with their family and lives in an adult home Lives in: Group home Stairs: No Has following equipment at home: Vannie - 2 wheeled and shower chair  PLOF: Needs assistance with ADLs  PATIENT GOALS: Have better postural stability and be able to walk better.  OBJECTIVE:  Note: Objective measures were completed at Evaluation unless otherwise noted.  DIAGNOSTIC FINDINGS:   MRI HEAD WITHOUT CONTRAST- 12/18/2022   COMPARISON:  Head CT December 16, 2022.   FINDINGS: Brain: Scattered foci no acute infarction, hemorrhage, hydrocephalus, extra-axial collection or mass lesion. Scattered foci T2 hyperintensity are seen within the white matter of the cerebral hemispheres, nonspecific. Mild parenchymal volume loss.   IMPRESSION: 1. No acute intracranial abnormality. 2. Scattered foci of T2 hyperintensity within the white matter of the cerebral  hemispheres, nonspecific but may represent mild chronic microvascular ischemic changes. 3. Mild parenchymal volume loss.  COGNITION: Overall cognitive status: History of cognitive impairments - at baseline   SENSATION: WFL  COORDINATION: Pt with fair coordination, mostly having deficit due to the inability to understand the command initially, but with verbal cuing is able to perform each one with slightly slower response time from norm.  POSTURE: rounded shoulders, forward head, and increased thoracic kyphosis   LOWER EXTREMITY MMT:    MMT Right Eval Left Eval  Hip flexion 4+ 4-  Hip extension    Hip abduction 4 4  Hip adduction 4- 4-  Hip internal rotation    Hip external rotation    Knee flexion 4+ 4-  Knee extension 4+ 4-  Ankle dorsiflexion 5 5  Ankle plantarflexion    Ankle inversion  Ankle eversion    (Blank rows = not tested)   STAIRS: Findings: Level of Assistance: Modified independence, Stair Negotiation Technique: Step to Pattern Alternating Pattern  with Bilateral Rails, Number of Stairs: 4, Height of Stairs: 6   , and Comments: Pt with use of bilateral hand rails ascending/descending.  Pt utilized alternating gait pattern ascending, step-to pattern descending  GAIT: Findings:  Pt ambulates without the use of AD, however has reduced stride length during ambulation.  Pt with L lateral lean when ambulating and reduced foot clearance bilaterally.  Pt displays trendelenburg on the R  FUNCTIONAL TESTS:  5 times sit to stand: 38.07 sec with UE on handrails Timed up and go (TUG): 26.54 sec 6 minute walk test: TBD 10 meter walk test: 15.48 sec; 0.65 m/s Berg Balance Scale: 39/56     [facility] PT Assessment - 10/11/23 0001       Berg Balance Test   Sit to Stand Able to stand without using hands and stabilize independently    Standing Unsupported Able to stand safely 2 minutes    Sitting with Back Unsupported but Feet Supported on Floor or Stool Able to sit  safely and securely 2 minutes    Stand to Sit Sits safely with minimal use of hands    Transfers Able to transfer safely, minor use of hands    Standing Unsupported with Eyes Closed Able to stand 10 seconds with supervision    Standing Unsupported with Feet Together Able to place feet together independently and stand for 1 minute with supervision    From Standing, Reach Forward with Outstretched Arm Can reach forward >5 cm safely (2)    From Standing Position, Pick up Object from Floor Able to pick up shoe, needs supervision    From Standing Position, Turn to Look Behind Over each Shoulder Turn sideways only but maintains balance    Turn 360 Degrees Able to turn 360 degrees safely but slowly    Standing Unsupported, Alternately Place Feet on Step/Stool Able to complete >2 steps/needs minimal assist    Standing Unsupported, One Foot in Front Able to take small step independently and hold 30 seconds    Standing on One Leg Tries to lift leg/unable to hold 3 seconds but remains standing independently    Total Score 39                                                                                                                                     TREATMENT DATE: 11/01/23  Nustep reciprocal movement and activity tolerance training, level 2-5 hill seat 9, arms 11. Visual feedback to improve ROM to punch in end of range on the LUE to improved knee extension on on the RLE. Performed x 4 Min +3 min with 1/5 min rest break between bouts.   Standing wall pushup on rail x 5 + 5 partial reps due to poor understanding of exercise.  Standing wall side plank 2 x 15 sec bil with moderate cues for set up   Lateral step over SPC with 1 LE x 10 bil. CGA to min assist due to mild posterior/L LOB noted. Very poor righting reactions when losing balance to the L  Reciprocal Forward step over [facility] min-mod assist to prevent LOB to the L when stepping LLE forward due to poor weight shift over the RLE.   Suitcase  carry x 172ft in the RUE to force improved weight shift over the RLE. Mild improvement in stance symmetry with additional weight over the R side.   Forward/reverse gait with visual feedback frim mirror to improve postural alignment; did not attend to body well, so provided instruction to verbalize number of fingers shown in mirror on the R side; mild improvement in posture and head alignment with forced visual scan up and to the R.   PATIENT EDUCATION: Education details: extensive cues for exercise form, tactile, verbal, and visual. Person educated: Patient and Caregiver   Education method: Explanation Education comprehension: verbalized understanding  HOME EXERCISE PROGRAM: Access Code: [email or web address] URL: [email or web address] Date: 10/11/2023 Prepared by: Massie Dollar  Exercises - Standing March with Counter Support  - 1 x daily - 5 x weekly - 3 sets - 10 reps - 2 hold - Standing Knee Flexion with Unilateral Counter Support  - 1 x daily - 5 x weekly - 3 sets - 10 reps - 2 hold - Sit to Stand with Armchair  - 1 x daily - 5 x weekly - 3 sets - 5 reps - Side Stepping with Counter Support  - 1 x daily - 5 x weekly - 3 sets - 5 reps  GOALS: Goals reviewed with patient? Yes  SHORT TERM GOALS: Target date: 11/06/2023  Pt will be independent with HEP in order to demonstrate increased ability to perform tasks related to occupation/hobbies. Baseline: to be given at the next visit Goal status: INITIAL  LONG TERM GOALS: Target date: 01/01/2024  1.  Patient (> 40 years old) will complete five times sit to stand test in <24 seconds indicating an increased LE strength and improved balance. Baseline: 38.07 sec Goal status: INITIAL   2.  Patient will increase Berg Balance score by > 6 points to demonstrate decreased fall risk during functional activities. Baseline: 39/56 Goal status: INITIAL   3.  Patient will reduce timed up and go to <11 seconds to reduce fall risk and  demonstrate improved transfer/gait ability. Baseline: 26.54 sec Goal status: INITIAL  4.  Patient will increase 10 meter walk test to >1.72m/s as to improve gait speed for better community ambulation and to reduce fall risk. Baseline: 15.48 sec; 0.65 m/s Goal status: INITIAL  5.  Patient will increase six minute walk test distance to >1000 for progression to community ambulator and improve gait ability Baseline:536 ft without AD.  Goal status: INITIAL   ASSESSMENT:  CLINICAL IMPRESSION: Continued with functional mobility training to improve posture and BLE strength. Was noted to have increased weight shift L resulting on lateral L LOB with side stepping and forward stepping over obstacle. Mild improvement in posture with visual scanning superior and to the R to verbalize number of fingers held up with forward/reverse gait. Pt will continue to benefit from skilled physical therapy intervention to address impairments, improve QOL, and attain therapy goals.   OBJECTIVE IMPAIRMENTS:  Abnormal gait, decreased activity tolerance, decreased balance, decreased cognition, decreased coordination, decreased endurance, decreased knowledge of use of DME, decreased  mobility, difficulty walking, decreased strength, decreased safety awareness, and improper body mechanics.   ACTIVITY LIMITATIONS: carrying, lifting, bending, standing, squatting, stairs, bathing, toileting, and dressing   PARTICIPATION LIMITATIONS: meal prep, cleaning, laundry, medication management, personal finances, driving, shopping, community activity, occupation, and yard work  PERSONAL FACTORS: Behavior pattern and 3+ comorbidities:  sick sinus syndrome, vertebral artery stenosis, pacemaker implant, developmental delay disorder, hypertension, and seizure disorder  are also affecting patient's functional outcome.   REHAB POTENTIAL: Good  CLINICAL DECISION MAKING: Evolving/moderate complexity  EVALUATION COMPLEXITY:  Moderate  PLAN:  PT FREQUENCY: 1-2x/week  PT DURATION: 12 weeks  PLANNED INTERVENTIONS: 97110-Therapeutic exercises, 97530- Therapeutic activity, [id number]- Neuromuscular re-education, 97535- Self Care, [id number]- Manual therapy, [phone]- Gait training, [phone]- Orthotic Fit/training, [phone]- Electrical stimulation (unattended), Patient/Family education, Balance training, Stair training, Joint mobilization, Cryotherapy, and Moist heat   PLAN FOR NEXT SESSION:  Postural strengthening.  LE strengthening dynamic balance and gait without AD    Massie Dollar PT, DPT  Physical Therapist - [facility]  [facility]  11:47 AM 11/01/23

## 2023-11-01 NOTE — Progress Notes (Signed)
 Referring Provider: Mazzella, Anthony James, MD [address] [facility] [address] Division of Cardiology [place],  [place] [address]  Primary Provider: Lizette Lango, MD [id number] [facility] [place] [address]   Reason for Visit: Frank Conley is a 63 y.o. male being seen for routine visit and continued care of symptomatic bradycardia with dual chamber pacemaker.  Assessment & Plan: 1. Symptomatic bradycardia  s/p Environmental manager dual chamber pacemaker He has increased PVC/PACs compared to previous year, but burden is approximately 1%. He is asymptomatic. No need for intervention as of now and will continue to monitor.  Pacemaker/ICD checked by device nurse.  I have reviewed and agree with the findings Normal pacemaker function A pace - 68% V pace - < 1% Events - none  Battery - 5 years Underlying Rhythm - SR Continue remotes -does not have remote monitor at bedside.  Goes to nurses station to send transmission.  Advised to send remote transmission on the first of every month.   2. Hypertension Mildly elevated Conley.  Continue to monitor and follow up with PCP  Follow-up: Annual device  History of Present Illness: Frank Conley is a 63 y.o. male with a history of sinus node dysfunction, symptomatic bradycardia who had a dual chamber pacemaker implanted 11/12/2014 by Dr Rea.   He also has past medical history significant for anxiety, anemia, depression, mild cognitive impairment, depression, and hypertension. He lives in a group home. Nurse/staff there helps with remote transmission for device.   Last visit  Frank Conley with his brother/guardian. He continues to not have cardiac symptoms but they noted two doctors appointments where his HR was low when taking his blood pressure. Was at psych appointment 9/18 and his HR was 36. Rechecked 45 minutes later and was in the 70s. Had happened previously a month prior where it came in the 30s and  immediately checked again and it was 70s. His Pacing threshold is 60 bpm and PVC/PAC burden has increased from 70ks to 200-300ks per device check.  Reports no cardiac symptoms and house staff has not noticed any changes in his behavior. He lives in an intermediate care facility in [place], [place].   Follows with Dr. Lizette and she manages BP - on amlodipine  10 mg daily, clonidine  0.1 mg BID, and clonidine  patch 0.2 mg weekly. States he has no medication issues.   Interval Hx: He has been doing well overall.  He Conley Conley with his brother and sister-in-law who are his guardians.  He lives in an intermediate care facility.  Has had no issues with his pacemaker, denies any chest pain, shortness of breath, dizziness/lightheadedness.  Cardiovascular History & Procedures: EP Procedures and Devices: 11/12/2014 Dual chamber [employer] pacemaker  Non-Invasive Evaluation(s): Echo:  11/04/2014 TTE Normal left ventricular systolic function, EF 55-60% Dilated left atrium - mild Normal right ventricular systolic function Dilated right atrium - mild           Other Past Medical History: See below for the complete EPIC list of past medical and surgical history.    Allergies: Other, Hctz [hydrochlorothiazide], Lisinopril, Levetiracetam, Phenytoin, Phenytoin sodium extended, and Risperidone analogues  Current Medications:  Current Outpatient Medications on File Prior to Visit  Medication Sig  . aspirin  (ECOTRIN) 81 MG tablet TAKE 1 TABLET BY MOUTH ONCE DAILY SWALLOW WHOLE  . clonazePAM  (KLONOPIN ) 0.5 MG tablet Take 1 tablet (0.5 mg total) by mouth Three (3) times a day.  SABRA  clonazePAM  (KLONOPIN ) 0.5 MG tablet Take 1 tablet (0.5 mg total) by mouth Three (3) times a day.  . cloNIDine  (CATAPRES -TTS) 0.2 mg/24 hr Place 1 patch on the skin once a week.  . cyanocobalamin, vitamin B-12, 1000 MCG tablet TAKE 1 TABLET BY MOUTH ONCE DAILY  . FEROSUL 325 mg (65 mg iron) tablet TAKE 1 TABLET BY MOUTH EVERY OTHER  DAY  . FISH OIL 300-1,000 mg capsule TAKE 2 CAPSULES BY MOUTH ONCE DAILY FOR SUPPLEMENT  . FISH OIL 340-1,000 mg capsule TAKE 2 CAPSULES BY MOUTH ONCE DAILY FOR SUPPLEMENT  . lamoTRIgine  (LAMICTAL ) 100 MG tablet Take 1 tablet (100 mg total) by mouth two (2) times a day.  . lamoTRIgine  (LAMICTAL ) 25 MG tablet Take 2 tablets by mouth daily in addition to 100 mg tablet daily for total morning dose of 150 mg. Take 3 tablets by mouth once nightly in addition to 100 mg tablet for a total nightly dose of 175 mg at bedtime  . loratadine  (CLARITIN ) 10 mg tablet TAKE 1 TABLET BY MOUTH ONCE DAILY FOR ALLERGY SYMPTOMS  . melatonin 3 mg Tab TAKE 1 TABLET BY MOUTH IN THE EVENING AT5PM  . miscellaneous medical supply Misc 2 pairs compression hose to be worn as needed for leg swelling  . MUCINEX  600 mg 12 hr tablet TAKE 1 TABLET BY MOUTH 2 TIMES A DAY  . omega 3-dha-epa-fish oil 300 mg (120 mg- 180mg )-1,000 mg cap TAKE 2 CAPSULES BY MOUTH ONCE DAILY FOR SUPPLEMENT  . polyethylene glycol (GLYCOLAX ) 17 gram/dose powder MIX 17 GRAMS IN 8 OZ OF WATER/JUICE AND DRINK TWICE A DAY - HOLD FOR LOOSE STOOLS. MAY GIVE UP TO 3 TIMES DAILY PERNURSING GUIDELINES  . PRESERVISION AREDS-2 250-90-40-1 mg cap TAKE 1 CAPSULE BY MOUTH 2 TIMES PER DAY  . QUEtiapine  (SEROQUEL ) 100 MG tablet Take 1.5 tablets (150 mg total) by mouth Three (3) times a day.  . QUEtiapine  (SEROQUEL ) 25 MG tablet Take 1 tablet (25 mg total) by mouth daily as needed (agitation) for up to 60 doses.  . rosuvastatin  (CRESTOR ) 20 MG tablet TAKE 1 TABLET BY MOUTH ONCE DAILY  . SENNA 8.6 mg tablet TAKE 2 TABLETS BY MOUTH NIGHTLY  . witch hazel-glycerin (TUCKS) pad Apply topically to the affected area as needed. (Patient taking differently: Apply topically to the affected area as needed. PRN)   No current facility-administered medications on file prior to visit.    Family History: The patient's family history includes Alcohol abuse in his father and mother;  Anxiety disorder in his mother; Arthritis in his brother and brother; Basal cell carcinoma in his brother; COPD in his mother; Cancer in his brother, brother, father, and mother; Dementia in his father, maternal grandmother, and paternal grandmother; Depression in his sister and sister; Diabetes in his mother; Emphysema in his mother; Glaucoma in his brother, brother, and mother; Heart disease in his father; Hyperlipidemia in his brother, brother, brother, and father; Hypertension in his brother, brother, father, mother, sister, and sister; Melanoma in his brother; Mental illness in his mother; No Known Problems in his maternal aunt, maternal uncle, paternal aunt, and paternal uncle; OCD in his mother; Parkinsonism in his maternal grandfather, maternal grandmother, and paternal grandfather; Squamous cell carcinoma in his brother; Vision loss in his mother.  Social history: He  reports that he has never smoked. He has never used smokeless tobacco. He reports that he does not drink alcohol and does not use drugs.  Review of Systems: As per HPI.  Rest of the review of ten systems is negative or unremarkable except as stated above.  Physical Exam: VITAL SIGNS:  Vitals:   11/13/23 1107  BP: 143/70  Pulse: 63  SpO2: 99%       Wt Readings from Last 3 Encounters:  11/13/23 79.2 kg (174 lb 9.6 oz)  09/26/23 80 kg (176 lb 6.4 oz)  09/21/23 80.7 kg (178 lb)     Conley's Body mass index is 28.4 kg/m.   Height: 167 cm (5' 5.75)   GENERAL: well-appearing in no acute distress HEENT: Normocephalic and atraumatic. Conjunctivae and sclerae clear and anicteric.  NECK: Supple.  CARDIOVASCULAR: Rate and rhythm are regular.   Normal S1, S2. There is no murmur, gallops or rubs RESPIRATORY: normal work of breathing on room air.   ABDOMEN: Soft, non-tender,  EXTREMITIES:  There is significant lower extremity edema, bilaterally.  SKIN: No rashes, ecchymosis or petechiae.  Warm, well perfused.  NEURO/PSYCH:  Cognitively impaired  Affect appropriate.  Nonfocal  Pertinent Laboratory Studies:  Clinical Support on 11/13/2023  Component Date Value Ref Range Status  . EKG Ventricular Rate 11/13/2023 67  BPM Preliminary  . EKG Atrial Rate 11/13/2023 67  BPM Preliminary  . EKG P-R Interval 11/13/2023 188  ms Preliminary  . EKG QRS Duration 11/13/2023 70  ms Preliminary  . EKG Q-T Interval 11/13/2023 398  ms Preliminary  . EKG QTC Calculation 11/13/2023 420  ms Preliminary  . EKG Calculated P Axis 11/13/2023 72  degrees Preliminary  . EKG Calculated R Axis 11/13/2023 18  degrees Preliminary  . EKG Calculated T Axis 11/13/2023 64  degrees Preliminary  . QTC Fredericia 11/13/2023 413  ms Preliminary  Office Visit on 09/26/2023  Component Date Value Ref Range Status  . T Albumin 09/26/2023 3.8  3.5 - 5.0 g/dL Final  . Alpha-1 Globulin 09/26/2023 0.3  0.2 - 0.5 g/dL Final  . Alpha-2 Globulin 09/26/2023 0.8  0.5 - 1.1 g/dL Final  . Beta-1 Globulin 09/26/2023 0.4  0.3 - 0.6 g/dL Final  . Beta-2 Globulin 09/26/2023 0.4  0.2 - 0.6 g/dL Final  . Gammaglobulin 09/26/2023 0.9  0.5 - 1.5 g/dL Final  . SPE Interpretation 09/26/2023    Final  . Total Protein 09/26/2023 6.6  g/dL Final  . Kappa Free, Serum 09/26/2023 5.22 (H)  0.33 - 1.94 mg/dL Final  . Lambda Free, Serum 09/26/2023 3.09 (H)  0.57 - 2.63 mg/dL Final  . K/L FLC Ratio 09/26/2023 1.69 (H)  0.26 - 1.65 Final  . Iron 09/26/2023 56 (L)  65 - 175 ug/dL Final  . TIBC 91/79/7974 226 (L)  250 - 425 ug/dL Final  . Iron Saturation (%) 09/26/2023 25  20 - 55 % Final  . Ferritin 09/26/2023 83.0  10.5 - 307.3 ng/mL Final  . Hemoglobin A1C 09/26/2023 5.0  4.8 - 5.6 % Final  . Estimated Average Glucose 09/26/2023 97  mg/dL Final  . Cholesterol, Total 09/26/2023 113  <200 mg/dL Final  . Cholesterol, HDL 09/26/2023 54  >40 mg/dL Final  . Cholesterol, LDL, Calculated 09/26/2023 55  <100 mg/dL Final  . Cholesterol, Non-HDL, Calculated 09/26/2023 59  <130 mg/dL  Final  . Triglycerides 09/26/2023 31  <150 mg/dL Final  . Fasting 91/79/7974 Unknown   Final  . Sodium 09/26/2023 146 (H)  135 - 145 mmol/L Final  . Potassium 09/26/2023 4.4  3.5 - 5.1 mmol/L Final  . Chloride 09/26/2023 106  98 - 107 mmol/L Final  . CO2 09/26/2023 27.0  20.0 - 31.0 mmol/L Final  . Anion Gap 09/26/2023 13  5 - 14 mmol/L Final  . BUN 09/26/2023 31 (H)  9 - 23 mg/dL Final  . Creatinine 91/79/7974 1.45 (H)  0.73 - 1.18 mg/dL Final  . BUN/Creatinine Ratio 09/26/2023 21   Final  . eGFR CKD-EPI (2021) Male 09/26/2023 55 (L)  >=60 mL/min/1.35m2 Final  . Glucose 09/26/2023 80  70 - 179 mg/dL Final  . Calcium  09/26/2023 8.8  8.7 - 10.4 mg/dL Final  . Albumin 91/79/7974 3.5  3.4 - 5.0 g/dL Final  . Total Protein 09/26/2023 6.5  5.7 - 8.2 g/dL Final  . Total Bilirubin 09/26/2023 0.4  0.3 - 1.2 mg/dL Final  . AST 91/79/7974 26  <=34 U/L Final  . ALT 09/26/2023 16  10 - 49 U/L Final  . Alkaline Phosphatase 09/26/2023 93  46 - 116 U/L Final  . Lamotrigine  Lvl 09/26/2023 7.7  3.0 - 15.0 mcg/mL Final  . Magnesium  09/26/2023 2.5  1.6 - 2.6 mg/dL Final  . WBC 91/79/7974 3.4 (L)  3.6 - 11.2 10*9/L Final  . RBC 09/26/2023 3.22 (L)  4.26 - 5.60 10*12/L Final  . HGB 09/26/2023 10.5 (L)  12.9 - 16.5 g/dL Final  . HCT 91/79/7974 30.6 (L)  39.0 - 48.0 % Final  . MCV 09/26/2023 94.9  77.6 - 95.7 fL Final  . MCH 09/26/2023 32.5 (H)  25.9 - 32.4 pg Final  . MCHC 09/26/2023 34.2  32.0 - 36.0 g/dL Final  . RDW 91/79/7974 15.5 (H)  12.2 - 15.2 % Final  . MPV 09/26/2023 8.4  6.8 - 10.7 fL Final  . Platelet 09/26/2023 162  150 - 450 10*9/L Final  . Neutrophils % 09/26/2023 67.8  % Final  . Lymphocytes % 09/26/2023 22.5  % Final  . Monocytes % 09/26/2023 7.8  % Final  . Eosinophils % 09/26/2023 1.2  % Final  . Basophils % 09/26/2023 0.7  % Final  . Absolute Neutrophils 09/26/2023 2.3  1.8 - 7.8 10*9/L Final  . Absolute Lymphocytes 09/26/2023 0.8 (L)  1.1 - 3.6 10*9/L Final  . Absolute Monocytes  09/26/2023 0.3  0.3 - 0.8 10*9/L Final  . Absolute Eosinophils 09/26/2023 0.0  0.0 - 0.5 10*9/L Final  . Absolute Basophils 09/26/2023 0.0  0.0 - 0.1 10*9/L Final  Orders Only on 09/04/2023  Component Date Value Ref Range Status  . Session datetime 09/04/2023 2023-09-04 18:06:00.0   Final  . Session type 09/04/2023 Remote Patient Initiated   Final  . Manufacturer 09/04/2023 BSX   Final  . Device type 09/04/2023 IPG   Final  . Model number 09/04/2023 [id number]   Final  . Serial number 09/04/2023 [id number]   Final  . Implant date 09/04/2023 2014-11-12   Final  . Battery remaining percentage 09/04/2023 57.00   Final  . Battery remaining longevity 09/04/2023 60.0   Final  . Battery status 09/04/2023 Beginning of Service   Final  . Atrial pacing percent 09/04/2023 69.00   Final  . RV pacing percent 09/04/2023 1.00   Final  . AT burden percent 09/04/2023 1.00   Final  . RA programmed sensitivity 09/04/2023 0.50   Final  . RA impedance 09/04/2023 535   Final  . RA threshold amplitude 09/04/2023 0.400   Final  . RA threshold pulsewidth 09/04/2023 0.4   Final  . RA threshold date 09/04/2023 2023-09-03   Final  . RA pacing amplitude 09/04/2023 2.000   Final  . RA pacing pulsewidth  09/04/2023 0.4   Final  . RV intrinsic amplitude 09/04/2023 9.500   Final  . RV programmed sensitivity 09/04/2023 2.50   Final  . RV impedance 09/04/2023 583   Final  . RV threshold amplitude 09/04/2023 1.600   Final  . RV threshold pulsewidth 09/04/2023 0.4   Final  . RV threshold date 09/04/2023 2023-09-03   Final  . RV pacing amplitude 09/04/2023 1.900   Final  . RV pacing pulsewidth 09/04/2023 0.4   Final  . [place]ola mode 09/04/2023 DDDR   Final  . Lower rate 09/04/2023 60   Final  . Mode switch rate 09/04/2023 150   Final  . Upper tracking rate 09/04/2023 130   Final  . Upper sensor rate 09/04/2023 130   Final  . Paced AV delay 09/04/2023 220   Final  . Sensed AV delay 09/04/2023 220   Final  . Zone type 09/04/2023  VT   Final  . Rate 1 09/04/2023 160   Final  . Status 09/04/2023 Monitor   Final  . Zone id 09/04/2023 1   Final    Lab Results  Component Value Date   PRO-BNP 757.0 (H) 12/07/2022   Creatinine/FP 0.68 (L) 03/08/2011   Creatinine/FP 0.76 12/06/2010   Creatinine 1.45 (H) 09/26/2023   Creatinine 1.44 (H) 12/11/2022   Creatinine 0.77 05/09/2014   Creatinine 0.76 05/08/2014   BUN/FP 16 03/08/2011   BUN/FP 20 12/06/2010   BUN 31 (H) 09/26/2023   BUN 25 (H) 12/11/2022   BUN 20 05/09/2014   BUN 21 05/08/2014   Sodium 146 (H) 09/26/2023   Sodium 134 (L) 05/09/2014   Potassium/FP 4.4 03/08/2011   Potassium 4.4 09/26/2023   Potassium 4.0 05/09/2014   CO2 27.0 09/26/2023   CO2 26 05/09/2014   Magnesium  2.5 09/26/2023   Magnesium  1.5 (L) 05/09/2014   Total Bilirubin 0.4 09/26/2023   Total Bilirubin 0.6 05/07/2014   INR 1.23 11/08/2014   INR 1.1 05/09/2013    No results found for: DIGOXIN  Lab Results  Component Value Date   TSH 2.023 12/07/2022   TSH 2.12 04/24/2014   Cholesterol, Total 113 09/26/2023   Cholesterol, Total 169 04/17/2013   Triglycerides 31 09/26/2023   Triglycerides 56 04/17/2013   HDL 54 04/17/2013   Cholesterol, HDL 54 09/26/2023   Cholesterol, Non-HDL, Calculated 59 09/26/2023   LDL Direct 116 08/01/2010   LDL Cholesterol, Calculated 104 04/17/2013   Cholesterol, LDL, Calculated 55 09/26/2023    Lab Results  Component Value Date   WBC 3.4 (L) 09/26/2023   WBC 5.7 05/09/2014   HGB 10.5 (L) 09/26/2023   HGB 10.7 (L) 05/09/2014   HCT 30.6 (L) 09/26/2023   HCT 31.2 (L) 05/09/2014   Platelet 162 09/26/2023   Platelet 109 (L) 05/09/2014     Past Medical History:  Diagnosis Date  . Adjustment disorder lifelong   currently with any change in routine  . Age-related nuclear cataract of both eyes 07/18/2017  . Anemia March 31   low hemoglobin in past but appears pale  . Anxiety    ongoing - under psychiatric care  . Arthritis 02/07/2007    significant degen spine and hips  . Autism spectrum disorder (HHS-HCC) childhood   Pervasive Developmental Disorders  . Basal cell carcinoma    scalp,face  . Binge eating adulthood ?  SABRA Cancer    (CMS-HCC) 02/07/2011   basal cell, scalp  . CKD (chronic kidney disease) stage 3, GFR 30-59 ml/min (CMS-HCC) 12/20/2018  .  Cognitive impairment    mild Frank, pervasive devel disorders  . Coronary artery disease    Pacemaker  . Current Outpatient Treatment jan 2012   STEP then [facility]  . Delirium 2010-11  . Depression 02/07/2008   OCD, depression, anxiety  . Depressive disorder 2010  . Developmental delay   . Gait abnormality   . Generalized anxiety disorder   . Hypertension 02/06/2010   currently under control (wt loss, exercise)  . Illiteracy and low-level literacy   . Intellectual disability    mild mental retardation; severe adaptation disorder  . Loss of consciousness    (CMS-HCC)    seizures  . Major depressive disorder   . Memory loss   . Mental/behavioral problem 02/06/2006   severe OCD; aggressive at times  . MHS (malignant hyperthermia)   . 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 Obesity ongoing  . Obsessive-compulsive disorder early childhood   reported by family; diagnosis as adult  . Oppositional defiant disorder   . Pancytopenia (CMS-HCC) 10/31/2014  . Prior Outpatient Treatment/Testing 2010-11   [place]  . Problem with transportation    requires continuous 1:1 due to behaviors  . Psychiatric Hospitalizations Sept 2011   stabilization following neuroleptic syndroms  . Raynaud disease   . Risk for falls 02/10/2015  . Schizoaffective disorder    (CMS-HCC)    see above  . Seizures    (CMS-HCC) early childhood   epilepsy; last episode approx 1 year ago  . 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 Venous insufficiency   . Violence/Aggression    family reports occasional occurences  lifelong  . Visual impairment    corrected effectively with bifocals    Past Surgical History:  Procedure Laterality Date  . FINGER SURGERY    . INSERT / REPLACE / REMOVE PACEMAKER    . PR COLONOSCOPY FLX DX W/COLLJ SPEC WHEN PFRMD N/A 10/07/2019   Procedure: COLONOSCOPY, FLEXIBLE, PROXIMAL TO SPLENIC FLEXURE; DIAGNOSTIC, W/WO COLLECTION SPECIMEN BY BRUSH OR WASH;  Surgeon: Krystal Jona Matter, MD;  Location: [facility];  Service: Gastroenterology  . PR COLONOSCOPY FLX DX W/COLLJ SPEC WHEN PFRMD Left 07/19/2021   Procedure: COLONOSCOPY, FLEXIBLE, PROXIMAL TO SPLENIC FLEXURE; DIAGNOSTIC, W/WO COLLECTION SPECIMEN BY BRUSH OR WASH;  Surgeon: Olam Earnie Henle, MD;  Location: [facility];  Service: Gastroenterology  . PR COLONOSCOPY W/BIOPSY SINGLE/MULTIPLE N/A 07/09/2013   Procedure: COLONOSCOPY, FLEXIBLE, PROXIMAL TO SPLENIC FLEXURE; WITH BIOPSY, SINGLE OR MULTIPLE;  Surgeon: Alphonsa Lav, MD;  Location: [facility];  Service: Gastroenterology  . PR INSER HART PACER XVENOUS ATR/VENTR Right 11/12/2014   Procedure: Implant Pacemaker, RIGHT chest;  Surgeon: Sherwood VEAR Lawyer, MD;  Location: [facility];  Service: Cardiology  . SKIN BIOPSY

## 2023-11-02 ENCOUNTER — Ambulatory Visit: Admitting: Physical Therapy

## 2023-11-22 ENCOUNTER — Emergency Department

## 2023-11-22 ENCOUNTER — Observation Stay
Admission: EM | Admit: 2023-11-22 | Discharge: 2023-11-23 | Disposition: A | Discharge status: Home / Self Care | Attending: Internal Medicine | Admitting: Internal Medicine

## 2023-11-22 ENCOUNTER — Other Ambulatory Visit: Payer: Self-pay

## 2023-11-22 ENCOUNTER — Encounter: Payer: Self-pay | Admitting: Intensive Care

## 2023-11-22 DIAGNOSIS — Z79899 Other long term (current) drug therapy: Secondary | ICD-10-CM | POA: Insufficient documentation

## 2023-11-22 DIAGNOSIS — F32A Depression, unspecified: Secondary | ICD-10-CM | POA: Diagnosis present

## 2023-11-22 DIAGNOSIS — D61818 Other pancytopenia: Secondary | ICD-10-CM | POA: Diagnosis present

## 2023-11-22 DIAGNOSIS — I6529 Occlusion and stenosis of unspecified carotid artery: Secondary | ICD-10-CM | POA: Diagnosis present

## 2023-11-22 DIAGNOSIS — R0602 Shortness of breath: Secondary | ICD-10-CM | POA: Diagnosis present

## 2023-11-22 DIAGNOSIS — I10 Essential (primary) hypertension: Secondary | ICD-10-CM | POA: Diagnosis present

## 2023-11-22 DIAGNOSIS — W19XXXA Unspecified fall, initial encounter: Secondary | ICD-10-CM | POA: Diagnosis not present

## 2023-11-22 DIAGNOSIS — Y92009 Unspecified place in unspecified non-institutional (private) residence as the place of occurrence of the external cause: Secondary | ICD-10-CM

## 2023-11-22 DIAGNOSIS — Z8673 Personal history of transient ischemic attack (TIA), and cerebral infarction without residual deficits: Secondary | ICD-10-CM | POA: Insufficient documentation

## 2023-11-22 DIAGNOSIS — D472 Monoclonal gammopathy: Secondary | ICD-10-CM | POA: Diagnosis not present

## 2023-11-22 DIAGNOSIS — R269 Unspecified abnormalities of gait and mobility: Secondary | ICD-10-CM

## 2023-11-22 DIAGNOSIS — N1831 Chronic kidney disease, stage 3a: Secondary | ICD-10-CM | POA: Diagnosis present

## 2023-11-22 DIAGNOSIS — I6509 Occlusion and stenosis of unspecified vertebral artery: Secondary | ICD-10-CM | POA: Diagnosis present

## 2023-11-22 DIAGNOSIS — F429 Obsessive-compulsive disorder, unspecified: Secondary | ICD-10-CM | POA: Diagnosis not present

## 2023-11-22 DIAGNOSIS — N281 Cyst of kidney, acquired: Secondary | ICD-10-CM | POA: Diagnosis not present

## 2023-11-22 DIAGNOSIS — G40909 Epilepsy, unspecified, not intractable, without status epilepticus: Secondary | ICD-10-CM | POA: Diagnosis not present

## 2023-11-22 DIAGNOSIS — N2889 Other specified disorders of kidney and ureter: Secondary | ICD-10-CM

## 2023-11-22 DIAGNOSIS — R2689 Other abnormalities of gait and mobility: Secondary | ICD-10-CM | POA: Diagnosis not present

## 2023-11-22 DIAGNOSIS — R7989 Other specified abnormal findings of blood chemistry: Secondary | ICD-10-CM | POA: Diagnosis present

## 2023-11-22 DIAGNOSIS — G459 Transient cerebral ischemic attack, unspecified: Secondary | ICD-10-CM | POA: Diagnosis present

## 2023-11-22 DIAGNOSIS — J189 Pneumonia, unspecified organism: Principal | ICD-10-CM | POA: Diagnosis present

## 2023-11-22 DIAGNOSIS — I129 Hypertensive chronic kidney disease with stage 1 through stage 4 chronic kidney disease, or unspecified chronic kidney disease: Secondary | ICD-10-CM | POA: Diagnosis not present

## 2023-11-22 DIAGNOSIS — E663 Overweight: Secondary | ICD-10-CM

## 2023-11-22 DIAGNOSIS — I5032 Chronic diastolic (congestive) heart failure: Secondary | ICD-10-CM | POA: Diagnosis present

## 2023-11-22 DIAGNOSIS — R2681 Unsteadiness on feet: Secondary | ICD-10-CM

## 2023-11-22 LAB — URINALYSIS, W/ REFLEX TO CULTURE (INFECTION SUSPECTED)
Bilirubin Urine: NEGATIVE
Glucose, UA: NEGATIVE mg/dL
Hgb urine dipstick: NEGATIVE
Ketones, ur: NEGATIVE mg/dL
Leukocytes,Ua: NEGATIVE
Nitrite: NEGATIVE
Protein, ur: 30 mg/dL — AB
Specific Gravity, Urine: 1.006 (ref 1.005–1.030)
pH: 6 (ref 5.0–8.0)

## 2023-11-22 LAB — CBC
HCT: 29.1 % — ABNORMAL LOW (ref 39.0–52.0)
Hemoglobin: 9.5 g/dL — ABNORMAL LOW (ref 13.0–17.0)
MCH: 31.3 pg (ref 26.0–34.0)
MCHC: 32.6 g/dL (ref 30.0–36.0)
MCV: 95.7 fL (ref 80.0–100.0)
Platelets: 143 K/uL — ABNORMAL LOW (ref 150–400)
RBC: 3.04 MIL/uL — ABNORMAL LOW (ref 4.22–5.81)
RDW: 15.9 % — ABNORMAL HIGH (ref 11.5–15.5)
WBC: 11.6 K/uL — ABNORMAL HIGH (ref 4.0–10.5)
nRBC: 0 % (ref 0.0–0.2)

## 2023-11-22 LAB — BASIC METABOLIC PANEL WITH GFR
Anion gap: 10 (ref 5–15)
BUN: 40 mg/dL — ABNORMAL HIGH (ref 8–23)
CO2: 24 mmol/L (ref 22–32)
Calcium: 9.1 mg/dL (ref 8.9–10.3)
Chloride: 104 mmol/L (ref 98–111)
Creatinine, Ser: 1.57 mg/dL — ABNORMAL HIGH (ref 0.61–1.24)
GFR, Estimated: 50 mL/min — ABNORMAL LOW (ref >60)
Glucose, Bld: 89 mg/dL (ref 70–99)
Potassium: 3.9 mmol/L (ref 3.5–5.1)
Sodium: 138 mmol/L (ref 135–145)

## 2023-11-22 LAB — RESP PANEL BY RT-PCR (RSV, FLU A&B, COVID)  RVPGX2
Influenza A by PCR: NEGATIVE
Influenza B by PCR: NEGATIVE
Resp Syncytial Virus by PCR: NEGATIVE
SARS Coronavirus 2 by RT PCR: NEGATIVE

## 2023-11-22 LAB — BRAIN NATRIURETIC PEPTIDE: B Natriuretic Peptide: 378.2 pg/mL — ABNORMAL HIGH (ref 0.0–100.0)

## 2023-11-22 LAB — TROPONIN I (HIGH SENSITIVITY)
Troponin I (High Sensitivity): 4 ng/L (ref <18)
Troponin I (High Sensitivity): 4 ng/L (ref <18)

## 2023-11-22 LAB — D-DIMER, QUANTITATIVE: D-Dimer, Quant: 0.67 ug{FEU}/mL — ABNORMAL HIGH (ref 0.00–0.50)

## 2023-11-22 MED ORDER — SODIUM CHLORIDE 0.9 % IV SOLN
1.0000 g | INTRAVENOUS | Status: DC
  Filled 2023-11-22: qty 10

## 2023-11-22 MED ORDER — SODIUM CHLORIDE 0.9 % IV SOLN
500.0000 mg | INTRAVENOUS | Status: DC
  Filled 2023-11-22: qty 5

## 2023-11-22 MED ORDER — ACETAMINOPHEN 650 MG RE SUPP: 650.0000 mg | RECTAL | Status: DC | PRN

## 2023-11-22 MED ORDER — QUETIAPINE FUMARATE 25 MG PO TABS
100.0000 mg | ORAL_TABLET | Freq: Two times a day (BID) | ORAL | Status: DC
  Administered 2023-11-23: 100 mg via ORAL
  Filled 2023-11-22: qty 4

## 2023-11-22 MED ORDER — ACETAMINOPHEN 325 MG PO TABS: 650.0000 mg | ORAL_TABLET | Freq: Four times a day (QID) | ORAL | Status: DC | PRN

## 2023-11-22 MED ORDER — HYDRALAZINE HCL 20 MG/ML IJ SOLN: 5.0000 mg | INTRAMUSCULAR | Status: DC | PRN

## 2023-11-22 MED ORDER — OCUVITE-LUTEIN PO CAPS
1.0000 | ORAL_CAPSULE | Freq: Two times a day (BID) | ORAL | Status: DC
  Filled 2023-11-22: qty 1

## 2023-11-22 MED ORDER — DM-GUAIFENESIN ER 30-600 MG PO TB12: 1.0000 | ORAL_TABLET | Freq: Two times a day (BID) | ORAL | Status: DC | PRN

## 2023-11-22 MED ORDER — ALBUTEROL SULFATE (2.5 MG/3ML) 0.083% IN NEBU: 2.5000 mg | INHALATION_SOLUTION | RESPIRATORY_TRACT | Status: DC | PRN

## 2023-11-22 MED ORDER — ACETAMINOPHEN 160 MG/5ML PO SOLN: 650.0000 mg | ORAL | Status: DC | PRN

## 2023-11-22 MED ORDER — QUETIAPINE FUMARATE 25 MG PO TABS: 100.0000 mg | ORAL_TABLET | Freq: Every day | ORAL | Status: DC

## 2023-11-22 MED ORDER — LAMOTRIGINE 100 MG PO TABS: 100.0000 mg | ORAL_TABLET | Freq: Two times a day (BID) | ORAL | Status: DC

## 2023-11-22 MED ORDER — LORAZEPAM 2 MG/ML IJ SOLN: 2.0000 mg | INTRAMUSCULAR | Status: DC | PRN

## 2023-11-22 MED ORDER — CLONIDINE HCL 0.2 MG/24HR TD PTWK
0.2000 mg | MEDICATED_PATCH | TRANSDERMAL | Status: DC
  Filled 2023-11-22: qty 1

## 2023-11-22 MED ORDER — IOHEXOL 350 MG/ML SOLN
75.0000 mL | Freq: Once | INTRAVENOUS | Status: AC | PRN
  Administered 2023-11-22: 75 mL via INTRAVENOUS

## 2023-11-22 MED ORDER — MELATONIN 5 MG PO TABS
5.0000 mg | ORAL_TABLET | Freq: Every evening | ORAL | Status: DC
  Administered 2023-11-22: 5 mg via ORAL
  Filled 2023-11-22: qty 1

## 2023-11-22 MED ORDER — LAMOTRIGINE 100 MG PO TABS
175.0000 mg | ORAL_TABLET | Freq: Every day | ORAL | Status: DC
  Administered 2023-11-22: 175 mg via ORAL
  Filled 2023-11-22: qty 3

## 2023-11-22 MED ORDER — SENNOSIDES-DOCUSATE SODIUM 8.6-50 MG PO TABS: 1.0000 | ORAL_TABLET | Freq: Every evening | ORAL | Status: DC | PRN

## 2023-11-22 MED ORDER — SENNA 8.6 MG PO TABS
2.0000 | ORAL_TABLET | Freq: Every day | ORAL | Status: DC
  Administered 2023-11-22: 17.2 mg via ORAL
  Filled 2023-11-22: qty 2

## 2023-11-22 MED ORDER — LAMOTRIGINE 100 MG PO TABS
150.0000 mg | ORAL_TABLET | Freq: Every day | ORAL | Status: DC
  Administered 2023-11-23: 150 mg via ORAL
  Filled 2023-11-22: qty 2

## 2023-11-22 MED ORDER — ASPIRIN 81 MG PO TBEC
81.0000 mg | DELAYED_RELEASE_TABLET | Freq: Every day | ORAL | Status: DC
  Administered 2023-11-23: 81 mg via ORAL
  Filled 2023-11-22: qty 1

## 2023-11-22 MED ORDER — ENOXAPARIN SODIUM 40 MG/0.4ML IJ SOSY
40.0000 mg | PREFILLED_SYRINGE | INTRAMUSCULAR | Status: DC
  Administered 2023-11-22: 40 mg via SUBCUTANEOUS
  Filled 2023-11-22: qty 0.4

## 2023-11-22 MED ORDER — CLONAZEPAM 0.25 MG PO TBDP
0.5000 mg | ORAL_TABLET | Freq: Three times a day (TID) | ORAL | Status: AC
Start: 2023-11-22 — End: ?
  Administered 2023-11-22 – 2023-11-23 (×2): 0.5 mg via ORAL
  Filled 2023-11-22 (×2): qty 2

## 2023-11-22 MED ORDER — ROSUVASTATIN CALCIUM 20 MG PO TABS
20.0000 mg | ORAL_TABLET | Freq: Every day | ORAL | Status: DC
  Administered 2023-11-23: 20 mg via ORAL
  Filled 2023-11-22: qty 1

## 2023-11-22 MED ORDER — ACETAMINOPHEN 325 MG PO TABS: 650.0000 mg | ORAL_TABLET | ORAL | Status: DC | PRN

## 2023-11-22 MED ORDER — VITAMIN B-12 1000 MCG PO TABS
1000.0000 ug | ORAL_TABLET | Freq: Every day | ORAL | Status: DC
  Administered 2023-11-23: 1000 ug via ORAL
  Filled 2023-11-22: qty 1

## 2023-11-22 MED ORDER — SODIUM CHLORIDE 0.9 % IV SOLN
1.0000 g | Freq: Once | INTRAVENOUS | Status: AC
  Administered 2023-11-22: 1 g via INTRAVENOUS
  Filled 2023-11-22: qty 10

## 2023-11-22 MED ORDER — QUETIAPINE FUMARATE 25 MG PO TABS: 150.0000 mg | ORAL_TABLET | Freq: Every day | ORAL | Status: DC

## 2023-11-22 MED ORDER — STROKE: EARLY STAGES OF RECOVERY BOOK: Freq: Once | Status: DC

## 2023-11-22 MED ORDER — SODIUM CHLORIDE 0.9 % IV SOLN
500.0000 mg | Freq: Once | INTRAVENOUS | Status: AC
  Administered 2023-11-22: 500 mg via INTRAVENOUS
  Filled 2023-11-22: qty 5

## 2023-11-22 MED ORDER — SODIUM CHLORIDE 0.9 % IV SOLN: INTRAVENOUS | Status: DC

## 2023-11-22 MED ORDER — QUETIAPINE FUMARATE 25 MG PO TABS: 25.0000 mg | ORAL_TABLET | Freq: Every day | ORAL | Status: DC | PRN

## 2023-11-22 MED ORDER — ONDANSETRON HCL 4 MG/2ML IJ SOLN: 4.0000 mg | Freq: Three times a day (TID) | INTRAMUSCULAR | Status: DC | PRN

## 2023-11-22 MED ORDER — FERROUS SULFATE 325 (65 FE) MG PO TABS
325.0000 mg | ORAL_TABLET | Freq: Every day | ORAL | Status: DC
  Administered 2023-11-23: 325 mg via ORAL
  Filled 2023-11-22: qty 1

## 2023-11-22 NOTE — H&P (Signed)
 History and Physical    Frank Conley:[id number] DOB: 1960/04/26 DOA: 11/22/2023  Referring MD/NP/PA:   PCP: Lizette Lango, MD   Patient coming from:  The patient is coming from adult daycare.     Chief Complaint: SOB and gait instability  HPI: Frank Conley is a 63 y.o. male with medical history significant of pervasive developmental disorder, OCD, depression, seizure, essential tremor, HTN, TIA, SSS (s/p of PPM), vertebral artery stenosis (evaluated in [facility], not good candidate for surgical treatment), chronic gait instability, MGUS, pancytopenia, dCHF, CKD 3A, who presents with SOB and gait instability.  Patient has a history of pervasive developmental disorder, cannot provide accurate medical history. Most of the history is obtained by discussing the case with ED physician, per EMS report, and with the nursing staff. I also called his brother by phone who provided some information.  Per his brother, patient was in his normal baseline when he saw pt Saturday. Today pt is noted to have SOB. Pt had oxygen desaturation to 71% in the facility, but his saturations is 100% in the ED.  Per his brother, patient has chronic gait instability and history of TIA.  He always lean toward the left side.   Normally he does not require assistance to walk around. Pt  looks weaker today, and needs assistance.  His gait instability seems to be worse.  When I saws pt in ED, pt denies chest pain.  He has mild SOB and mild dry cough.  His temperature is normal 97.6 in ED.  No active nausea, vomiting, diarrhea noted.  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  He moves both legs normally.  No facial droop or slurred speech.  He knows his own name and knows that he is in hospital.  He is confused about year which is not typical for him.  Denies symptoms of UTI.  Per report, patient fell last night which is unwitnessed.  Does not seem to have significant injury.  Data reviewed independently and ED Course: pt was  found to have WBC 11.6, negative PCR for COVID, flu and RSV, stable renal function, negative UA, troponin 4- > 4, D-dimer 0.67, temperature 97.6, blood pressure 171/83, heart rate 50-60s, RR 13, oxygen saturation 100% currently.  Patient is placed in telemetry bed for patient.  Chest x-ray: 1. Hazy opacity at left lung base, possibly atelectasis or infection. No pleural effusion or pneumothorax.  CT of head: 1. No acute intracranial abnormality or acute traumatic injury. 2. Mild for age chronic white matter disease.  CTA of chest: 1. No filling defect is identified in the pulmonary arterial tree to suggest pulmonary embolus. 2. Complex anterior left renal cystic lesion measuring about 9.3 by 8.5 cm. There is density along the anterior margin which could be from displaced flattened parenchyma or possibly an indicator of neoplasm. Nevertheless, renal protocol MRI (if the pacer is compatible) or renal protocol CT with and without contrast is recommended for definitive characterization. 3. Bandlike opacities in the left lower lobe with some confluent airspace opacity in the posterior basal segment left lower lobe. At least a component of this is from atelectasis although a small component of pneumonia in the posterior basal segment is not excluded. 4. Mild airspace opacity with volume loss medially in the right middle lobe favoring atelectasis. 5. Several small ground-glass opacities in the right middle lobe are ill-defined and likely inflammatory. 6. Prominence of stool in visualized portion of the colon, constipation not excluded. 7. Mild cardiomegaly.  Trace anterior inferior pericardial effusion. 8. Left anterior descending coronary artery atherosclerosis.     CT of head and neck: 1. Widespread atherosclerosis without a large vessel occlusion. 2. 65% stenosis of the proximal right ICA, mildly progressed. 3. Unchanged 75% or greater stenosis of the proximal left ICA. 4. Unchanged  stenoses elsewhere including severe bilateral intracranial ICA stenoses and moderate to severe bilateral V1 and V4 stenoses.   EKG: I have personally reviewed.   regular, lots of artificial effects, QTc 425, low voltage   Review of Systems: Could not be reviewed accurately due to pervasive developmental disorder.    Allergy:  Allergies  Allergen Reactions   Dilantin [Phenytoin]     Unknown reaction   Keppra [Levetiracetam]     Unknown reaction    Lisinopril     Unknown reaction     Past Medical History:  Diagnosis Date   Depression    Hypertension    MGUS (monoclonal gammopathy of unknown significance)    Pervasive developmental disorder    Seizures (HCC)    SSS (sick sinus syndrome) (HCC)    PPM placed    Past Surgical History:  Procedure Laterality Date   PACEMAKER IMPLANT      Social History:  reports that he has never smoked. He has never used smokeless tobacco. He reports that he does not drink alcohol and does not use drugs.  Family History:  Family History  Family history unknown: Yes     Prior to Admission medications   Medication Sig Start Date End Date Taking? Authorizing Provider  clonazePAM  (KLONOPIN ) 0.5 MG disintegrating tablet Take 0.5 mg by mouth 3 (three) times daily. 12/13/18   [provider]  cloNIDine  (CATAPRES  - DOSED IN MG/24 HR) 0.2 mg/24hr patch Place 0.2 mg onto the skin once a week. Thursday 11/29/18   [provider]  ferrous sulfate  325 (65 FE) MG tablet Take 325 mg by mouth daily.    [provider]  lamoTRIgine  (LAMICTAL ) 100 MG tablet Take 100 mg by mouth 2 (two) times daily. 12/04/18   [provider]  lamoTRIgine  (LAMICTAL ) 25 MG tablet Take 50-75 mg by mouth See admin instructions. Take in addition to 100 mg tablet for a total morning dose of 150mg  and bedtime dose of 175mg  12/04/18   [provider]  loratadine  (CLARITIN ) 10 MG tablet Take 10 mg by mouth daily.    [provider]  Melatonin 3 MG TABS Take 3 mg by mouth every evening.    [provider]  Multiple Vitamins-Minerals (PRESERVISION AREDS 2) CAPS Take 1 capsule by mouth 2 (two) times daily.    [provider]  Omega-3 Fatty Acids (FISH OIL) 1000 MG CAPS Take 2,000 mg by mouth daily.    [provider]  polyethylene glycol (MIRALAX  / GLYCOLAX ) 17 g packet Take 17 g by mouth in the morning, at noon, and at bedtime.    [provider]  QUEtiapine  (SEROQUEL ) 100 MG tablet Take 100-150 mg by mouth See admin instructions. Take 1 tablet by mouth twice a day then 1.5 tablets at bedtime 12/04/18   [provider]  QUEtiapine  (SEROQUEL ) 25 MG tablet Take 25 mg by mouth daily as needed (severe agitation). 11/07/18   [provider]  rosuvastatin  (CRESTOR ) 20 MG tablet Take 1 tablet (20 mg total) by mouth daily. 12/19/22 03/19/23  Briana Elgin LABOR, MD  senna (SENOKOT) 8.6 MG TABS tablet Take 2 tablets by mouth at bedtime.  [provider]    Physical Exam: Vitals:   11/22/23 1436 11/22/23 1500 11/22/23 1800 11/22/23 1832  BP:  (!) 169/83 (!) 171/83   Pulse:  (!) 59 64   Resp:  12 13   Temp: 97.6 F (36.4 C)   98.3 F (36.8 C)  TempSrc: Oral   Oral  SpO2:  100% 99%   Weight:      Height:       General: Not in acute distress HEENT:       Eyes: PERRL, EOMI, no jaundice       ENT: No discharge from the ears and nose, no pharynx injection, no tonsillar enlargement.        Neck: No JVD, no bruit, no mass felt. Heme: No neck lymph node enlargement. Cardiac: S1/S2, RRR, No murmurs, No gallops or rubs. Respiratory: No rales, wheezing, rhonchi or rubs. GI: Soft, nondistended, nontender, no organomegaly, BS present. GU: No hematuria Ext: No pitting leg edema bilaterally. 1+DP/PT pulse bilaterally. Musculoskeletal: No joint deformities, No joint redness or warmth. 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  Neuro:  Alert, partially following command, cooperative, knows his own name, knows that he is in the hospital, confused about year,  cranial nerves II-XII grossly intact, moves all extremities.   Psych: Patient is not psychotic, no suicidal or hemocidal ideation.  Labs on Admission: I have personally reviewed following labs and imaging studies  CBC: Recent Labs  Lab 11/22/23 0929  WBC 11.6*  HGB 9.5*  HCT 29.1*  MCV 95.7  PLT 143*   Basic Metabolic Panel: Recent Labs  Lab 11/22/23 0929  NA 138  K 3.9  CL 104  CO2 24  GLUCOSE 89  BUN 40*  CREATININE 1.57*  CALCIUM  9.1   GFR: Estimated Creatinine Clearance: 47.8 mL/min (A) (by C-G formula based on SCr of 1.57 mg/dL (H)). Liver Function Tests: No results for input(s): AST, ALT, ALKPHOS, BILITOT, PROT, ALBUMIN in the last 168 hours. No results for input(s): LIPASE, AMYLASE in the last 168 hours. No results for input(s): AMMONIA in the last 168 hours. Coagulation Profile: No results for input(s): INR, PROTIME in the last 168 hours. Cardiac Enzymes: No results for input(s): CKTOTAL, CKMB, CKMBINDEX, TROPONINI in the last 168 hours. BNP (last 3 results) No results for input(s): PROBNP in the last 8760 hours. HbA1C: No results for input(s): HGBA1C in the last 72 hours. CBG: No results for input(s): GLUCAP in the last 168 hours. Lipid Profile: No results for input(s): CHOL, HDL, LDLCALC, TRIG, CHOLHDL, LDLDIRECT in the last 72 hours. Thyroid Function Tests: No results for input(s): TSH, T4TOTAL, FREET4, T3FREE, THYROIDAB in the last 72 hours. Anemia Panel: No results for input(s): VITAMINB12, FOLATE, FERRITIN, TIBC, IRON, RETICCTPCT in the last 72 hours. Urine analysis:    Component Value Date/Time   COLORURINE STRAW (A) 11/22/2023 1235   APPEARANCEUR CLEAR (A) 11/22/2023 1235   APPEARANCEUR Clear 10/24/2013 2006   LABSPEC 1.006 11/22/2023 1235   LABSPEC  1.006 10/24/2013 2006   PHURINE 6.0 11/22/2023 1235   GLUCOSEU NEGATIVE 11/22/2023 1235   GLUCOSEU 150 mg/dL 90/81/7984 7993   HGBUR NEGATIVE 11/22/2023 1235   BILIRUBINUR NEGATIVE 11/22/2023 1235   BILIRUBINUR Negative 10/24/2013 2006   KETONESUR NEGATIVE 11/22/2023 1235   PROTEINUR 30 (A) 11/22/2023 1235   NITRITE NEGATIVE 11/22/2023 1235   LEUKOCYTESUR NEGATIVE 11/22/2023 1235   LEUKOCYTESUR Negative 10/24/2013 2006   Sepsis Labs: @LABRCNTIP (procalcitonin:4,lacticidven:4) ) Recent Results (from the past 240  hours)  Resp panel by RT-PCR (RSV, Flu A&B, Covid) Anterior Nasal Swab     Status: None   Collection Time: 11/22/23 12:34 PM   Specimen: Anterior Nasal Swab  Result Value Ref Range Status   SARS Coronavirus 2 by RT PCR NEGATIVE NEGATIVE Final    Comment: (NOTE) SARS-CoV-2 target nucleic acids are NOT DETECTED.  The SARS-CoV-2 RNA is generally detectable in upper respiratory specimens during the acute phase of infection. The lowest concentration of SARS-CoV-2 viral copies this assay can detect is 138 copies/mL. A negative result does not preclude SARS-Cov-2 infection and should not be used as the sole basis for treatment or other patient management decisions. A negative result may occur with  improper specimen collection/handling, submission of specimen other than nasopharyngeal swab, presence of viral mutation(s) within the areas targeted by this assay, and inadequate number of viral copies(<138 copies/mL). A negative result must be combined with clinical observations, patient history, and epidemiological information. The expected result is Negative.  Fact Sheet for Patients:  BloggerCourse.com  Fact Sheet for Healthcare Providers:  SeriousBroker.it  This test is no t yet approved or cleared by the [place]  FDA and  has been authorized for detection and/or diagnosis of SARS-CoV-2 by FDA under an Emergency Use  Authorization (EUA). This EUA will remain  in effect (meaning this test can be used) for the duration of the COVID-19 declaration under Section 564(b)(1) of the Act, 21 U.S.C.section 360bbb-3(b)(1), unless the authorization is terminated  or revoked sooner.       Influenza A by PCR NEGATIVE NEGATIVE Final   Influenza B by PCR NEGATIVE NEGATIVE Final    Comment: (NOTE) The Xpert Xpress SARS-CoV-2/FLU/RSV plus assay is intended as an aid in the diagnosis of influenza from Nasopharyngeal swab specimens and should not be used as a sole basis for treatment. Nasal washings and aspirates are unacceptable for Xpert Xpress SARS-CoV-2/FLU/RSV testing.  Fact Sheet for Patients: BloggerCourse.com  Fact Sheet for Healthcare Providers: SeriousBroker.it  This test is not yet approved or cleared by the [place]  FDA and has been authorized for detection and/or diagnosis of SARS-CoV-2 by FDA under an Emergency Use Authorization (EUA). This EUA will remain in effect (meaning this test can be used) for the duration of the COVID-19 declaration under Section 564(b)(1) of the Act, 21 U.S.C. section 360bbb-3(b)(1), unless the authorization is terminated or revoked.     Resp Syncytial Virus by PCR NEGATIVE NEGATIVE Final    Comment: (NOTE) Fact Sheet for Patients: BloggerCourse.com  Fact Sheet for Healthcare Providers: SeriousBroker.it  This test is not yet approved or cleared by the [place]  FDA and has been authorized for detection and/or diagnosis of SARS-CoV-2 by FDA under an Emergency Use Authorization (EUA). This EUA will remain in effect (meaning this test can be used) for the duration of the COVID-19 declaration under Section 564(b)(1) of the Act, 21 U.S.C. section 360bbb-3(b)(1), unless the authorization is terminated or revoked.  Performed at [facility], [address] [place], [place] [address]      Radiological Exams on Admission:   Assessment/Plan Principal Problem:   CAP (community acquired pneumonia) Active Problems:   Gait disturbance   Vertebral artery stenosis   Internal carotid artery stenosis   TIA (transient ischemic attack)   Essential hypertension   Seizure disorder (HCC)   Chronic diastolic CHF (congestive heart failure) (HCC)   Pancytopenia (HCC)   MGUS (monoclonal gammopathy of unknown significance)   Chronic kidney disease, stage 3a (HCC)  Fall at home, initial encounter   Renal cyst   Positive D dimer   OCD (obsessive compulsive disorder)   Depression   Assessment and Plan:   CAP (community acquired pneumonia): per report, pt had oxygen desaturation to 71% on room air in facility, but his saturations 100% on room air in ED.  No respiratory distress.  Has mild leukocytosis with WBC 11.6, but no fever, clinically not septic.  - Place in tele bed for obs - IV Rocephin  and azithromycin  - Incentive spirometry - Mucinex  for cough  - Bronchodilators - Urine legionella and S. pneumococcal antigen - Follow up blood culture x2, sputum culture  Gait disturbance, hx of vertebral artery stenosis and internal carotid artery stenosis: His gait instability is a chronic issue, may be worse than baseline, possibly due to ongoing infection.  CT of head and neck showed no LVO, but showed ICA stenosis.  Per his brother, patient was evaluated in [facility] for his vertebral artery stenosis and the decision is made for no surgical treatment since patient is a poor candidate for surgical treatment.  They still prefer medical management now.  New TIA or small stroke potential differential diagnosis.  CT head negative.  Patient cannot do MRI due to presence of pacemaker.  - Fall precaution - PT/OT - ASA and Crestor  - Swallowing screen. If fails, will get SLP  Hx of TIA (transient ischemic attack) -ASA and Crestor   Essential  hypertension -continue clonidine  patch 0.2 weekly - IV hydralazine as needed  Seizure disorder (HCC) -Seizure precaution -As needed Ativan  for seizure - Continue home Klonopin  and Lamictal   Chronic diastolic CHF (congestive heart failure) (HCC): 2D echo on 12/17/2022 showed EF of 55 to 60%.  Patient does not have leg edema JVD.  CHF seems to be compensated. -Watch volume status closely. - Check BNP  Pancytopenia (HCC) and MGUS (monoclonal gammopathy of unknown significance): Hemoglobin 11.1 on 12/16/2019 --> 9.5 -pt is on iron supplement  Chronic kidney disease, stage 3a (HCC): Stable.  Baseline creatinine 1.65 on 12/16/2019.  His creatinine is 5.57, BUN 40, GFR 50. -Follow-up with BMP  Fall at home, initial encounter -PT/OT - Fall precaution  Renal cyst: this is an incidental finding by CTA of chest. - Patient need to follow-up with PCP as outpatient workup after acute issue is over  Positive D dimer: D-dimer 0.67.  CTA negative for PE -Follow-up lower extremity venous Doppler to rule out a DVT  OCD (obsessive compulsive disorder) and Depression -Continue Seroquel       DVT ppx:  SQ Lovenox   Code Status: DNR per his brother   Family Communication:   Yes, patient's brother by phone  Disposition Plan:  Anticipate discharge back to previous environment,  adult daycare.    Consults called: None  Admission status and Level of care: Telemetry Medical:    for obs       Dispo: The patient is from: Adult daycare              Anticipated d/c is to: Adult daycare              Anticipated d/c date is: 1 day              Patient currently is not medically stable to d/c.    Severity of Illness:  The appropriate patient status for this patient is OBSERVATION. Observation status is judged to be reasonable and necessary in order to provide the required intensity of service to ensure the patient's safety.  The patient's presenting symptoms, physical exam findings, and initial  radiographic and laboratory data in the context of their medical condition is felt to place them at decreased risk for further clinical deterioration. Furthermore, it is anticipated that the patient will be medically stable for discharge from the hospital within 2 midnights of admission.        Date of Service 11/22/2023    Caleb Exon [facility]   If 7PM-7AM, please contact night-coverage [email or web address] 11/22/2023, 7:35 PM

## 2023-11-22 NOTE — ED Triage Notes (Addendum)
 Arrived by [facility] from [facility] (Adult daycare).  Staff reports patient is not acting his baseline. Reports SOB and unsteady gait. Baseline uses no assistance to get around. Since arriving to ER, patient is now complaining of chest pain   Patient reports he fell last night. Unsure how fall occurred.   Has pacemaker  EMS vitals: 122/66 b/p 66HR 95% RA

## 2023-11-22 NOTE — ED Provider Notes (Signed)
 [facility] Provider Note    Event Date/Time   First MD Initiated Contact with Patient 11/22/23 1141     (approximate)   History   Fall and Shortness of Breath   HPI  Frank Conley is a 63 year old male with history of HTN, CKD, sick sinus syndrome s/p pacemaker, MGUS, seizure disorder presenting to the emergency department for evaluation of altered mental status.  They report patient has had new shortness of breath and unsteady gait.  Normally does not require assistance to walk around.  Complaining of chest pain in triage.  Also reporting a fall last night, unclear precipitant.    Worker from day program accompanies patient.  She notes that when patient arrived he was complaining of shortness of breath.  He had an oxygen level of 71% with labored respirations so EMS was called.  Also noted to have unsteady gait which is atypical for patient.  Reviewed discharge summary from 12/18/2022.  At that time patient presented with left-sided weakness found possible recent ruptured plaque versus short segment dissection, discharged on aspirin , Plavix  x 3 months and Crestor .     Physical Exam   Triage Vital Signs: ED Triage Vitals  Encounter Vitals Group     BP 11/22/23 0921 123/74     Girls Systolic BP Percentile --      Girls Diastolic BP Percentile --      Boys Systolic BP Percentile --      Boys Diastolic BP Percentile --      Pulse Rate 11/22/23 0921 62     Resp 11/22/23 0921 18     Temp 11/22/23 0921 97.7 F (36.5 C)     Temp Source 11/22/23 0921 Oral     SpO2 11/22/23 0921 98 %     Weight 11/22/23 0922 170 lb (77.1 kg)     Height 11/22/23 0922 5' 8 (1.727 m)     Head Circumference --      Peak Flow --      Pain Score --      Pain Loc --      Pain Education --      Exclude from Growth Chart --     Most recent vital signs: Vitals:   11/22/23 1436 11/22/23 1500  BP:  (!) 169/83  Pulse:  (!) 59  Resp:  12  Temp: 97.6 F (36.4 C)   SpO2:   100%    Nursing notes and vital signs reviewed.  General: Adult male, lying in bed, awake interactive Head: Atraumatic Chest: Symmetric chest rise, no tenderness to palpation Cardiac: Regular rhythm and rate.  Respiratory: Lungs clear to auscultation, satting in the upper 90s on room air Abdomen: Soft, nondistended. No tenderness to palpation.  MSK: No deformity to bilateral upper and lower extremity.  Neuro: Alert, oriented.  Skin: No evidence of burns or lacerations.  ED Results / Procedures / Treatments   Labs (all labs ordered are listed, but only abnormal results are displayed) Labs Reviewed  BASIC METABOLIC PANEL WITH GFR - Abnormal; Notable for the following components:      Result Value   BUN 40 (*)    Creatinine, Ser 1.57 (*)    GFR, Estimated 50 (*)    All other components within normal limits  CBC - Abnormal; Notable for the following components:   WBC 11.6 (*)    RBC 3.04 (*)    Hemoglobin 9.5 (*)    HCT 29.1 (*)    RDW  15.9 (*)    Platelets 143 (*)    All other components within normal limits  URINALYSIS, W/ REFLEX TO CULTURE (INFECTION SUSPECTED) - Abnormal; Notable for the following components:   Color, Urine STRAW (*)    APPearance CLEAR (*)    Protein, ur 30 (*)    Bacteria, UA RARE (*)    All other components within normal limits  D-DIMER, QUANTITATIVE - Abnormal; Notable for the following components:   D-Dimer, Quant 0.67 (*)    All other components within normal limits  RESP PANEL BY RT-PCR (RSV, FLU A&B, COVID)  [id number]  TROPONIN I (HIGH SENSITIVITY)  TROPONIN I (HIGH SENSITIVITY)     EKG EKG independently reviewed and interpreted by myself demonstrates:  EKG demonstrates sinus rhythm at a rate of 70, PR 170, QRS 62, QTc 425, no acute ST changes  RADIOLOGY Imaging independently reviewed and interpreted by myself demonstrates:  CT head without acute bleed Chest x-Noah Lembke with questionable pneumonia  Formal Radiology Read:  CT Head Wo  Contrast Result Date: 11/22/2023 EXAM: CT HEAD WITHOUT CONTRAST 11/22/2023 10:13:43 AM TECHNIQUE: CT of the head was performed without the administration of intravenous contrast. Automated exposure control, iterative reconstruction, and/or weight based adjustment of the mA/kV was utilized to reduce the radiation dose to as low as reasonably achievable. COMPARISON: Brain MRI 12/18/2022. Head CT 12/16/2022. CLINICAL HISTORY: 63 year old male with minor head trauma, not acting baseline, reports SOB, unsteady gait, and chest pain. Fell last night. FINDINGS: BRAIN AND VENTRICLES: No acute hemorrhage. No evidence of acute infarct. No hydrocephalus. No extra-axial collection. No mass effect or midline shift. Stable cerebral volume since last year, overall normal for age. Patchy periventricular white matter hypodensity is mild by CT and stable. No suspicious intracranial vascular hyperdensity. Advanced calcified atherosclerosis at the skull base. ORBITS: No acute abnormality. SINUSES: Mild chronic left lamina papyracea fracture is stable. Minor chronic paranasal sinus mucosal thickening is stable. SOFT TISSUES AND SKULL: No acute soft tissue abnormality. No skull fracture. IMPRESSION: 1. No acute intracranial abnormality or acute traumatic injury. 2. Mild for age chronic white matter disease. Electronically signed by: Helayne Hurst MD 11/22/2023 10:28 AM EDT RP Workstation: [id number]   DG Chest 2 View Result Date: 11/22/2023 EXAM: 2 VIEW(S) XRAY OF THE CHEST 11/22/2023 09:44:00 AM COMPARISON: av radiograph of the chest dated 12/22/2018. CLINICAL HISTORY: cp. Staff reports patient is not acting his baseline. Reports SOB and unsteady gait. Baseline uses no assistance to get around. Since arriving to ER, patient is now complaining of chest pain ; ; Patient reports he fell last night. Unsure how fall occurred. FINDINGS: LINES, TUBES AND DEVICES: Right chest wall dual lead cardiac pacing device in place. LUNGS AND PLEURA:  Hazy opacity at left lung base, may represent atelectasis or infection. No pulmonary edema. No pleural effusion. No pneumothorax. HEART AND MEDIASTINUM: No acute abnormality of the cardiac and mediastinal silhouettes. BONES AND SOFT TISSUES: Multilevel degenerative changes of thoracic spine. IMPRESSION: 1. Hazy opacity at left lung base, possibly atelectasis or infection. No pleural effusion or pneumothorax. Electronically signed by: Evalene Coho MD 11/22/2023 09:49 AM EDT RP Workstation: [id number]    PROCEDURES:  Critical Care performed: No  Procedures   MEDICATIONS ORDERED IN ED: Medications  iohexol  (OMNIPAQUE ) 350 MG/ML injection 75 mL (75 mLs Intravenous Contrast Given 11/22/23 1526)  iohexol  (OMNIPAQUE ) 350 MG/ML injection 75 mL (75 mLs Intravenous Contrast Given 11/22/23 1523)     IMPRESSION / MDM / ASSESSMENT AND PLAN / ED COURSE  I reviewed the triage vital signs and the nursing notes.  Differential diagnosis includes, but is not limited to, intracranial bleed, intracerebral arterial pathology, pneumonia, UTI, PE, ACS, consideration for CVA though no clear last known well and without clear focal deficit  Patient's presentation is most consistent with acute presentation with potential threat to life or bodily function.  63 year old male presenting with impaired ambulation, shortness of breath.  Stable vitals on presentation.  Labs with mild leukocytosis, slightly worsened anemia from baseline without reported acute bleeding sources.  BMP with stable renal impairment.  D-dimer slightly elevated, CTA of the chest ordered to further evaluate.  Negative troponin.  Viral swab negative.  Urine overall not suggestive of infection.  CT head without acute bleed.  Chest x-Tomer Chalmers with questionable pneumonia.  Did receive collateral from family about history of carotid issues, reviewed discharge summary from November 2024.  With history of vertebral artery pathology and reported new gait  instability, will obtain CTA head and neck to further evaluate.  Signed out to oncoming physician pending CTs and disposition.      FINAL CLINICAL IMPRESSION(S) / ED DIAGNOSES   Final diagnoses:  Shortness of breath  Gait instability     Rx / DC Orders   ED Discharge Orders     None        Note:  This document was prepared using Dragon voice recognition software and may include unintentional dictation errors.   Levander Slate, MD 11/22/23 [phone]

## 2023-11-23 ENCOUNTER — Observation Stay

## 2023-11-23 ENCOUNTER — Encounter: Payer: Self-pay | Admitting: Internal Medicine

## 2023-11-23 DIAGNOSIS — J189 Pneumonia, unspecified organism: Secondary | ICD-10-CM | POA: Diagnosis not present

## 2023-11-23 DIAGNOSIS — F32A Depression, unspecified: Secondary | ICD-10-CM

## 2023-11-23 DIAGNOSIS — I6509 Occlusion and stenosis of unspecified vertebral artery: Secondary | ICD-10-CM | POA: Diagnosis not present

## 2023-11-23 DIAGNOSIS — R7989 Other specified abnormal findings of blood chemistry: Secondary | ICD-10-CM

## 2023-11-23 DIAGNOSIS — R269 Unspecified abnormalities of gait and mobility: Secondary | ICD-10-CM | POA: Diagnosis not present

## 2023-11-23 DIAGNOSIS — I6529 Occlusion and stenosis of unspecified carotid artery: Secondary | ICD-10-CM | POA: Diagnosis not present

## 2023-11-23 LAB — BASIC METABOLIC PANEL WITH GFR
Anion gap: 7 (ref 5–15)
BUN: 31 mg/dL — ABNORMAL HIGH (ref 8–23)
CO2: 24 mmol/L (ref 22–32)
Calcium: 8.9 mg/dL (ref 8.9–10.3)
Chloride: 106 mmol/L (ref 98–111)
Creatinine, Ser: 1.53 mg/dL — ABNORMAL HIGH (ref 0.61–1.24)
GFR, Estimated: 51 mL/min — ABNORMAL LOW (ref >60)
Glucose, Bld: 177 mg/dL — ABNORMAL HIGH (ref 70–99)
Potassium: 3.8 mmol/L (ref 3.5–5.1)
Sodium: 137 mmol/L (ref 135–145)

## 2023-11-23 LAB — CBC
HCT: 29.9 % — ABNORMAL LOW (ref 39.0–52.0)
Hemoglobin: 9.6 g/dL — ABNORMAL LOW (ref 13.0–17.0)
MCH: 31.1 pg (ref 26.0–34.0)
MCHC: 32.1 g/dL (ref 30.0–36.0)
MCV: 96.8 fL (ref 80.0–100.0)
Platelets: 149 K/uL — ABNORMAL LOW (ref 150–400)
RBC: 3.09 MIL/uL — ABNORMAL LOW (ref 4.22–5.81)
RDW: 15.9 % — ABNORMAL HIGH (ref 11.5–15.5)
WBC: 6.1 K/uL (ref 4.0–10.5)
nRBC: 0 % (ref 0.0–0.2)

## 2023-11-23 LAB — HEMOGLOBIN A1C
Hgb A1c MFr Bld: 4.9 % (ref 4.8–5.6)
Mean Plasma Glucose: 93.93 mg/dL

## 2023-11-23 LAB — LIPID PANEL
Cholesterol: 99 mg/dL (ref 0–200)
HDL: 59 mg/dL (ref >40)
LDL Cholesterol: 34 mg/dL (ref 0–99)
Total CHOL/HDL Ratio: 1.7 ratio
Triglycerides: 29 mg/dL (ref <150)
VLDL: 6 mg/dL (ref 0–40)

## 2023-11-23 LAB — STREP PNEUMONIAE URINARY ANTIGEN: Strep Pneumo Urinary Antigen: NEGATIVE

## 2023-11-23 MED ORDER — DM-GUAIFENESIN ER 30-600 MG PO TB12: 1.0000 | ORAL_TABLET | Freq: Two times a day (BID) | ORAL | 0 refills | Status: DC | PRN

## 2023-11-23 MED ORDER — LEVOFLOXACIN 750 MG PO TABS: 750.0000 mg | ORAL_TABLET | Freq: Every day | ORAL | 0 refills | Status: AC

## 2023-11-23 MED ORDER — LEVOFLOXACIN 750 MG PO TABS: 750.0000 mg | ORAL_TABLET | Freq: Every day | ORAL | Status: DC

## 2023-11-23 NOTE — NC FL2 (Signed)
 [place]  MEDICAID [id number] LEVEL OF CARE FORM     IDENTIFICATION  [name] Birthdate: 01-23-1961 Sex: male Admission Date (Current Location): 11/22/2023  [facility] and [place] Number:  Chiropodist and Address:  [facility], [address], [place], [place] [address]      Provider Number: [id number]  Attending Physician Name and Address:  Caleen Qualia, MD  Relative Name and Phone Number:  Geddy, [place] (Brother)  [phone]    Current Level of Care: Hospital Recommended Level of Care: [facility] Prior Approval Number:    Date Approved/Denied:   PASRR Number:    Discharge Plan: Domiciliary (Rest home)    Current Diagnoses: Patient Active Problem List   Diagnosis Date Noted   CAP (community acquired pneumonia) 11/22/2023   Internal carotid artery stenosis 11/22/2023   Chronic diastolic CHF (congestive heart failure) (HCC) 11/22/2023   Chronic kidney disease, stage 3a (HCC) 11/22/2023   Overweight (BMI 25.0-29.9) 11/22/2023   Fall at home, initial encounter 11/22/2023   Renal cyst 11/22/2023   TIA (transient ischemic attack) 11/22/2023   Positive D dimer 11/22/2023   OCD (obsessive compulsive disorder) 11/22/2023   Depression 11/22/2023   Vertebral artery stenosis 12/18/2022   Left-sided weakness 12/17/2022   MGUS (monoclonal gammopathy of unknown significance) 07/07/2021   Multifocal pneumonia 12/20/2018   Seizure disorder (HCC) 12/20/2018   COVID-19 virus infection 12/20/2018   Acute respiratory failure with hypoxia (HCC) 12/20/2018   Essential hypertension 12/20/2018   Hyponatremia 12/20/2018   Pancytopenia (HCC) 12/20/2018   Developmental delay disorder 12/20/2018   Pneumonia due to COVID-19 virus 12/20/2018   CKD (chronic kidney disease) stage 3, GFR 30-59 ml/min (HCC) 12/20/2018   Age-related nuclear cataract of both eyes 07/18/2017   Early dry stage nonexudative age-related macular degeneration of  both eyes 07/18/2017   Myopia with astigmatism and presbyopia, bilateral 07/18/2017   Onychomycosis 06/29/2016   Risk for falls 02/10/2015   Pacemaker 11/06/2014   Septic shock (HCC) 11/06/2014   Dysarthria 10/31/2014   Altered mental status 10/30/2014   Thrombocytopenia 10/08/2014   Medication monitoring encounter 04/24/2014   Tremor 04/24/2014   Unsteadiness on feet 04/24/2014   History of nonmelanoma skin cancer 07/22/2013   Colon polyps 07/09/2013   Enuresis, primary, functional 05/09/2013   Gait disturbance 04/17/2013   Hemorrhoid 04/17/2013   Intention tremor 04/17/2013   Actinic keratosis 05/27/2010   Major depressive disorder, single episode, severe, with psychotic behavior (HCC) 03/21/2010   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   Pervasive developmental disorder 03/21/2010   Obsessive-compulsive disorder 03/07/2010   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   Obesity due to excess calories 02/25/2010    Orientation RESPIRATION BLADDER Height & Weight     Self    Incontinent, Continent Weight: 81.4 kg Height:  5' 8 (172.7 cm)  BEHAVIORAL SYMPTOMS/MOOD NEUROLOGICAL BOWEL NUTRITION STATUS      Continent Diet (Heart)  AMBULATORY STATUS COMMUNICATION OF NEEDS Skin   Supervision                           Personal Care Assistance Level of Assistance  Dressing, Feeding, Bathing Bathing Assistance: Independent Feeding assistance: Independent Dressing Assistance: Independent     Functional Limitations Info             SPECIAL CARE FACTORS FREQUENCY                       Contractures  Additional Factors Info  Code Status, Allergies Code Status Info: DNR Allergies Info: Dilantin, Keppra, Lisinopril           Current Medications (11/23/2023):  This is the current hospital active medication list Current Facility-Administered Medications  Medication Dose Route Frequency Provider Last Rate Last Admin    stroke: early stages of recovery book    Does not apply Once Niu, Xilin, MD       0.9 %  sodium chloride  infusion   Intravenous Continuous Niu, Xilin, MD   Stopped at 11/23/23 1324   acetaminophen  (TYLENOL ) tablet 650 mg  650 mg Oral Q4H PRN Niu, Xilin, MD       Or   acetaminophen  (TYLENOL ) 160 MG/5ML solution 650 mg  650 mg Per Tube Q4H PRN Niu, Xilin, MD       Or   acetaminophen  (TYLENOL ) suppository 650 mg  650 mg Rectal Q4H PRN Niu, Xilin, MD       albuterol (PROVENTIL) (2.5 MG/3ML) 0.083% nebulizer solution 2.5 mg  2.5 mg Inhalation Q4H PRN Niu, Xilin, MD       aspirin  EC tablet 81 mg  81 mg Oral Daily Niu, Xilin, MD   81 mg at 11/23/23 0831   clonazePAM  (KLONOPIN ) disintegrating tablet 0.5 mg  0.5 mg Oral TID Niu, Xilin, MD   0.5 mg at 11/23/23 0831   cloNIDine  (CATAPRES  - Dosed in mg/24 hr) patch 0.2 mg  0.2 mg Transdermal Weekly Niu, Xilin, MD       cyanocobalamin (VITAMIN B12) tablet 1,000 mcg  1,000 mcg Oral Daily Niu, Xilin, MD   1,000 mcg at 11/23/23 0831   dextromethorphan-guaiFENesin  (MUCINEX  DM) 30-600 MG per 12 hr tablet 1 tablet  1 tablet Oral BID PRN Niu, Xilin, MD       enoxaparin  (LOVENOX ) injection 40 mg  40 mg Subcutaneous Q24H Niu, Xilin, MD   40 mg at 11/22/23 2318   ferrous sulfate  tablet 325 mg  325 mg Oral Q breakfast Niu, Xilin, MD   325 mg at 11/23/23 0831   hydrALAZINE (APRESOLINE) injection 5 mg  5 mg Intravenous Q2H PRN Niu, Xilin, MD       lamoTRIgine  (LAMICTAL ) tablet 150 mg  150 mg Oral Daily Niu, Xilin, MD   150 mg at 11/23/23 0831   lamoTRIgine  (LAMICTAL ) tablet 175 mg  175 mg Oral QHS Niu, Xilin, MD   175 mg at 11/22/23 2320   levofloxacin (LEVAQUIN) tablet 750 mg  750 mg Oral Daily Amin, Sumayya, MD       LORazepam  (ATIVAN ) injection 2 mg  2 mg Intravenous Q2H PRN Niu, Xilin, MD       melatonin tablet 5 mg  5 mg Oral QPM Niu, Xilin, MD   5 mg at 11/22/23 2319   multivitamin-lutein  (OCUVITE-LUTEIN ) capsule 1 capsule  1 capsule Oral BID Niu, Xilin, MD       ondansetron  (ZOFRAN ) injection 4 mg  4 mg  Intravenous Q8H PRN Niu, Xilin, MD       QUEtiapine  (SEROQUEL ) tablet 100 mg  100 mg Oral BID Niu, Xilin, MD   100 mg at 11/23/23 0830   QUEtiapine  (SEROQUEL ) tablet 150 mg  150 mg Oral QHS Niu, Xilin, MD       QUEtiapine  (SEROQUEL ) tablet 25 mg  25 mg Oral Daily PRN Niu, Xilin, MD       rosuvastatin  (CRESTOR ) tablet 20 mg  20 mg Oral Daily Niu, Xilin, MD   20 mg at 11/23/23 0830  senna (SENOKOT) tablet 17.2 mg  2 tablet Oral QHS Niu, Xilin, MD   17.2 mg at 11/22/23 2319   senna-docusate (Senokot-S) tablet 1 tablet  1 tablet Oral QHS PRN Niu, Xilin, MD         Discharge Medications: Please see discharge summary for a list of discharge medications.  Relevant Imaging Results:  Relevant Lab Results:   Additional Information SSN    [id number]  Dalia GORMAN Fuse, RN

## 2023-11-23 NOTE — Progress Notes (Signed)
 OT Cancellation Note  Patient Details Name: Frank Conley MRN: [id number] DOB: [birth year]   Cancelled Treatment:    Reason Eval/Treat Not Completed: Medical issues which prohibited therapy. Orders received, chart reviewed. Pt currently with elevated D-dimer and pending US  to rule out acute DVT. OT will hold eval at this time and see when medically appropriate.   Gwenn Teodoro L. Skyeler Smola, OTR/L  11/23/23, 8:52 AM

## 2023-11-23 NOTE — Care Management Obs Status (Signed)
 MEDICARE OBSERVATION STATUS NOTIFICATION   Patient Details  Name: Frank Conley MRN: [id number] Date of Birth: 1960-05-24   Medicare Observation Status Notification Given:  Yes brother is guardian and was present this morining.    [name] 11/23/2023, 9:39 AM

## 2023-11-23 NOTE — Care Plan (Signed)
 [facility] records reviewed.   After a thorough review of the chart this patient does not meet criteria for a Transitions Call at this time. Reason for Disqualification : Currrently Inpatient. Please contact [phone] with any further questions.              [place] Bryant, RN

## 2023-11-23 NOTE — Discharge Summary (Signed)
 Physician Discharge Summary   [name] MRN: [id number] DOB: [birth year]  Admit date:     11/22/2023  Discharge date: 11/23/23  Discharge Physician: Amaryllis Dare   PCP: Lizette Lango, MD   Recommendations at discharge:  Please obtain CBC and BMP on follow-up Please ensure completion of antibiotics-repeat chest imaging to see the resolution of current abnormalities. Patient need a renal designated MRI or CT with contrast by PCP for further characterization of concern of complex renal cyst. Please encourage p.o. hydration Follow-up with primary care provider within a week  Discharge Diagnoses: Principal Problem:   CAP (community acquired pneumonia) Active Problems:   Gait disturbance   Vertebral artery stenosis   Internal carotid artery stenosis   TIA (transient ischemic attack)   Essential hypertension   Seizure disorder (HCC)   Chronic diastolic CHF (congestive heart failure) (HCC)   Pancytopenia (HCC)   MGUS (monoclonal gammopathy of unknown significance)   Chronic kidney disease, stage 3a (HCC)   Fall at home, initial encounter   Renal cyst   Positive D dimer   OCD (obsessive compulsive disorder)   Depression   Hospital Course: Partly taken from H&P.  Frank Conley is a 63 y.o. male with medical history significant of pervasive developmental disorder, OCD, depression, seizure, essential tremor, HTN, TIA, SSS (s/p of PPM), vertebral artery stenosis (evaluated in [facility], not good candidate for surgical treatment), chronic gait instability, MGUS, pancytopenia, dCHF, CKD 3A, who presents with SOB and gait instability.   Patient has a history of pervasive developmental disorder, cannot provide accurate medical history. Most of the history is obtained by discussing the case with ED physician, per EMS report, and with the nursing staff and talking with brother on phone.  Patient has a chronic gait instability and always leaning towards the left, normally can walk  without assistance but yesterday patient was feeling more weaker and needing assistance to walk.  Per report, patient fell last night which is unwitnessed.  Does not seem to have any significant injury.  On presentation mildly elevated blood pressure at 171/83 otherwise stable vital, labs with leukocytosis at 11.6,negative PCR for COVID, flu and RSV, stable renal function, negative UA, troponin 4- > 4, D-dimer 0.67, chest x-ray with hazy opacity in left lung base, possibly atelectasis or infection. CT of head was negative for any acute abnormality  CTA chest was negative for PE, did shows mild airspace opacity with volume loss in the right middle lobe favoring atelectasis and several small ground glass opacities in the right middle lobe likely inflammatory, opacities in the left lower lobe with some component of atelectasis with some superadded pneumonia.  Incidental finding of a complex anterior left renal cystic lesion, concerning for any neoplasm with recommendation adrenal protocol MRI if pacer is compatible or a renal protocol CT with and without contrast.  CTA head and neck with widespread atherosclerosis without a LVO.  65% stenosis of right ICA, unchanged 75% stenosis of left ICA, unchanged severe bilateral intracranial ICA stenosis and moderate to severe bilateral V1-V4 stenosis.  Patient was started on Rocephin  and Zithromax  for pneumonia.  10/17: Vital stable, on room air, lipid panel normal with LDL of 34, preliminary blood cultures negative in 12 hours, UA with mild protein urea, strep pneumo negative, BNP at 378.  Clinically appears euvolemic and remained on room air.  Also has an history of CKD stage III A, creatinine remained at baseline.  Lower extremity venous Doppler was negative.  Patient is being discharged  back to his group home.  He need to have a close follow-up with PCP.  PCP should be able to order more imaging for better characterization of complex renal cyst.  Patient  already received contrast on admission for CTA.  Repeat kidney function seems stable.  He should be encouraged for p.o. hydration.  Patient received ceftriaxone  and Zithromax  while in the hospital and is being discharged on Levaquin to complete a total of 5-day course for concern of pneumonia.  He will continue the rest of his home medications and follow-up with his providers for further assistance.  Consultants: None Procedures performed: None Disposition: Group home Diet recommendation:  Discharge Diet Orders (From admission, onward)     Start     Ordered   11/23/23 0000  Diet - low sodium heart healthy        11/23/23 1239           Regular diet DISCHARGE MEDICATION: Allergies as of 11/23/2023       Reactions   Dilantin [phenytoin]    Unknown reaction   Keppra [levetiracetam]    Unknown reaction   Lisinopril    Unknown reaction        Medication List     TAKE these medications    acetaminophen  325 MG tablet Commonly known as: TYLENOL  Take 650 mg by mouth every 6 (six) hours as needed for mild pain (pain score 1-3).   clonazePAM  0.5 MG disintegrating tablet Commonly known as: KLONOPIN  Take 0.5 mg by mouth 3 (three) times daily.   cloNIDine  0.2 mg/24hr patch Commonly known as: CATAPRES  - Dosed in mg/24 hr Place 0.2 mg onto the skin once a week. Thursday   cyanocobalamin 1000 MCG tablet Commonly known as: VITAMIN B12 Take 1,000 mcg by mouth daily.   dextromethorphan-guaiFENesin  30-600 MG 12hr tablet Commonly known as: MUCINEX  DM Take 1 tablet by mouth 2 (two) times daily as needed for cough.   ferrous sulfate  325 (65 FE) MG tablet Take 325 mg by mouth daily.   Fish Oil 1000 MG Caps Take 2,000 mg by mouth daily.   lamoTRIgine  25 MG tablet Commonly known as: LAMICTAL  Take 50-75 mg by mouth See admin instructions. Take in addition to 100 mg tablet for a total morning dose of 150mg  and bedtime dose of 175mg    lamoTRIgine  100 MG tablet Commonly known  as: LAMICTAL  Take 100 mg by mouth 2 (two) times daily.   levofloxacin 750 MG tablet Commonly known as: LEVAQUIN Take 1 tablet (750 mg total) by mouth daily for 4 days.   loratadine  10 MG tablet Commonly known as: CLARITIN  Take 10 mg by mouth daily.   melatonin 3 MG Tabs tablet Take 3 mg by mouth every evening.   Mucinex  600 MG 12 hr tablet Generic drug: guaiFENesin  Take 1 tablet by mouth 2 (two) times daily as needed for cough.   polyethylene glycol 17 g packet Commonly known as: MIRALAX  / GLYCOLAX  Take 17 g by mouth in the morning, at noon, and at bedtime.   PreserVision AREDS 2 Caps Take 1 capsule by mouth 2 (two) times daily.   QUEtiapine  100 MG tablet Commonly known as: SEROQUEL  Take 100-150 mg by mouth See admin instructions. Take 1 tablet by mouth twice a day then 1.5 tablets at bedtime What changed: Another medication with the same name was removed. Continue taking this medication, and follow the directions you see here.   rosuvastatin  20 MG tablet Commonly known as: CRESTOR  Take 1 tablet (20 mg total)  by mouth daily.   senna 8.6 MG Tabs tablet Commonly known as: SENOKOT Take 2 tablets by mouth at bedtime.        Follow-up Information     Lizette Lango, MD Follow up.   Specialty: Family Medicine Why: hospital follow up Contact information: [id number] [facility] [place] [address] [phone]                Discharge Exam: Fredricka Weights   11/22/23 9077 11/22/23 2056  Weight: 77.1 kg 81.4 kg   General.  Cognitively impaired gentleman, in no acute distress. Pulmonary.  Lungs clear bilaterally, normal respiratory effort. CV.  Regular rate and rhythm, no JVD, rub or murmur. Abdomen.  Soft, nontender, nondistended, BS positive. CNS.  Alert and oriented .  No focal neurologic deficit. Extremities.  No edema, pulses intact and symmetrical.   Condition at discharge: stable  The results of significant diagnostics from  this hospitalization (including imaging, microbiology, ancillary and laboratory) are listed below for reference.   Imaging Studies: US  Venous Img Lower Bilateral (DVT) Result Date: 11/23/2023 CLINICAL DATA:  Positive D-dimer, lower extremity edema EXAM: BILATERAL LOWER EXTREMITY VENOUS DOPPLER ULTRASOUND TECHNIQUE: Gray-scale sonography with graded compression, as well as color Doppler and duplex ultrasound were performed to evaluate the lower extremity deep venous systems from the level of the common femoral vein and including the common femoral, femoral, profunda femoral, popliteal and calf veins including the posterior tibial, peroneal and gastrocnemius veins when visible. The superficial great saphenous vein was also interrogated. Spectral Doppler was utilized to evaluate flow at rest and with distal augmentation maneuvers in the common femoral, femoral and popliteal veins. COMPARISON:  None Available. FINDINGS: RIGHT LOWER EXTREMITY Common Femoral Vein: No evidence of thrombus. Normal compressibility, respiratory phasicity and response to augmentation. Saphenofemoral Junction: No evidence of thrombus. Normal compressibility and flow on color Doppler imaging. Profunda Femoral Vein: No evidence of thrombus. Normal compressibility and flow on color Doppler imaging. Femoral Vein: No evidence of thrombus. Normal compressibility, respiratory phasicity and response to augmentation. Popliteal Vein: No evidence of thrombus. Normal compressibility, respiratory phasicity and response to augmentation. Calf Veins: No evidence of thrombus. Normal compressibility and flow on color Doppler imaging. Superficial Great Saphenous Vein: No evidence of thrombus. Normal compressibility. Venous Reflux:  None. Other Findings:  None. LEFT LOWER EXTREMITY Common Femoral Vein: No evidence of thrombus. Normal compressibility, respiratory phasicity and response to augmentation. Saphenofemoral Junction: No evidence of thrombus. Normal  compressibility and flow on color Doppler imaging. Profunda Femoral Vein: No evidence of thrombus. Normal compressibility and flow on color Doppler imaging. Femoral Vein: No evidence of thrombus. Normal compressibility, respiratory phasicity and response to augmentation. Popliteal Vein: No evidence of thrombus. Normal compressibility, respiratory phasicity and response to augmentation. Calf Veins: No evidence of thrombus. Normal compressibility and flow on color Doppler imaging. Superficial Great Saphenous Vein: No evidence of thrombus. Normal compressibility. Venous Reflux:  None. Other Findings:  None. IMPRESSION: No evidence of deep venous thrombosis in either lower extremity. Electronically Signed   By: Wilkie Lent M.D.   On: 11/23/2023 10:58   CT ANGIO HEAD NECK W WO CM Result Date: 11/22/2023 CLINICAL DATA:  Neuro deficit, acute, stroke suspected. EXAM: CT ANGIOGRAPHY HEAD AND NECK WITH AND WITHOUT CONTRAST TECHNIQUE: Multidetector CT imaging of the head and neck was performed using the standard protocol during bolus administration of intravenous contrast. Multiplanar CT image reconstructions and MIPs were obtained to evaluate the vascular anatomy. Carotid stenosis  measurements (when applicable) are obtained utilizing NASCET criteria, using the distal internal carotid diameter as the denominator. RADIATION DOSE REDUCTION: This exam was performed according to the departmental dose-optimization program which includes automated exposure control, adjustment of the mA and/or kV according to patient size and/or use of iterative reconstruction technique. CONTRAST:  75mL OMNIPAQUE  IOHEXOL  350 MG/ML SOLN COMPARISON:  CTA head and neck 12/16/2022 FINDINGS: CTA NECK FINDINGS Aortic arch: Normal variant aortic arch branching pattern with common origin of the brachiocephalic and left common carotid arteries. Widely patent brachiocephalic and subclavian arteries. Right carotid system: Patent with mixed calcified  and soft plaque in the carotid bulb resulting in 65% stenosis of the proximal ICA, mildly progressed. Left carotid system: Patent with bulky calcified plaque throughout the proximal ICA resulting in high-grade stenosis of 75% or greater, similar to prior. Vertebral arteries: Patent and codominant. As before, there is suspected moderate to severe stenosis of the right V1 segment with assessment limited by streak artifact from venous contrast. Mixed calcified and soft plaque in the left V1 segment results in up to severe stenosis distally, similar to prior. Skeleton: Bulky anterior vertebral osteophytes throughout the cervical spine. Other neck: No evidence of cervical lymphadenopathy or mass. Upper chest: No mass or consolidation in the included lung apices. Review of the MIP images confirms the above findings CTA HEAD FINDINGS Anterior circulation: The internal carotid arteries are patent from skull base to carotid termini with calcified plaque resulting in severe bilateral paraclinoid stenoses, similar to prior. ACAs and MCAs are patent with branch vessel irregularity but no evidence of a proximal branch occlusion or significant proximal stenosis. No aneurysm is identified. Posterior circulation: The intracranial vertebral arteries are patent with right greater than left V4 segment atherosclerosis resulting in up to severe stenosis on the right and moderate stenosis on the left, similar to prior. Patent SCA origins are visualized bilaterally. The basilar artery is widely patent. Posterior communicating arteries are diminutive or absent. Both PCAs are patent without evidence of a significant proximal stenosis. No aneurysm is identified. Venous sinuses: Patent. Anatomic variants: None. Review of the MIP images confirms the above findings IMPRESSION: 1. Widespread atherosclerosis without a large vessel occlusion. 2. 65% stenosis of the proximal right ICA, mildly progressed. 3. Unchanged 75% or greater stenosis of the  proximal left ICA. 4. Unchanged stenoses elsewhere including severe bilateral intracranial ICA stenoses and moderate to severe bilateral V1 and V4 stenoses. Electronically Signed   By: Dasie Hamburg M.D.   On: 11/22/2023 17:16   CT Angio Chest PE W and/or Wo Contrast Result Date: 11/22/2023 CLINICAL DATA:  Elevated D-dimer level.  Shortness of breath. EXAM: CT ANGIOGRAPHY CHEST WITH CONTRAST TECHNIQUE: Multidetector CT imaging of the chest was performed using the standard protocol during bolus administration of intravenous contrast. Multiplanar CT image reconstructions and MIPs were obtained to evaluate the vascular anatomy. RADIATION DOSE REDUCTION: This exam was performed according to the departmental dose-optimization program which includes automated exposure control, adjustment of the mA and/or kV according to patient size and/or use of iterative reconstruction technique. CONTRAST:  75mL OMNIPAQUE  IOHEXOL  350 MG/ML SOLN COMPARISON:  None Available. FINDINGS: Cardiovascular: No filling defect is identified in the pulmonary arterial tree to suggest pulmonary embolus. Left anterior descending coronary artery atherosclerosis. Dual lead pacer noted. Mild cardiomegaly. Trace anterior inferior pericardial effusion. Mediastinum/Nodes: Unremarkable Lungs/Pleura: Mild airspace opacity with volume loss medially in the right middle lobe favoring atelectasis. Bandlike opacities in the left lower lobe with some confluent airspace opacity in  the posterior basal segment left lower lobe. At least a component of this is from atelectasis although a small component of pneumonia in the posterior basal segment is not excluded. Old granulomatous disease noted. Several small ground-glass opacities in the right middle lobe are ill-defined and likely inflammatory. Upper Abdomen: Prominence of stool in visualized portion of the colon, constipation not excluded. Complex anterior left renal cystic lesion measuring about 9.3 by 8.5 cm on  image 143 series 5, with density along the anterior margin which could be from displaced flattened parenchyma or possibly an indicator of neoplasm. Do not see overt nodularity and most of the central portion of the lesion is simple fluid. Nevertheless, renal protocol MRI (if the pacer is compatible) or renal protocol CT with and without contrast is recommended for definitive characterization. Atheromatous vascular plaque proximally in the right renal artery. Musculoskeletal: Thoracic spondylosis. Review of the MIP images confirms the above findings. IMPRESSION: 1. No filling defect is identified in the pulmonary arterial tree to suggest pulmonary embolus. 2. Complex anterior left renal cystic lesion measuring about 9.3 by 8.5 cm. There is density along the anterior margin which could be from displaced flattened parenchyma or possibly an indicator of neoplasm. Nevertheless, renal protocol MRI (if the pacer is compatible) or renal protocol CT with and without contrast is recommended for definitive characterization. 3. Bandlike opacities in the left lower lobe with some confluent airspace opacity in the posterior basal segment left lower lobe. At least a component of this is from atelectasis although a small component of pneumonia in the posterior basal segment is not excluded. 4. Mild airspace opacity with volume loss medially in the right middle lobe favoring atelectasis. 5. Several small ground-glass opacities in the right middle lobe are ill-defined and likely inflammatory. 6. Prominence of stool in visualized portion of the colon, constipation not excluded. 7. Mild cardiomegaly. Trace anterior inferior pericardial effusion. 8. Left anterior descending coronary artery atherosclerosis. Electronically Signed   By: Ryan Salvage M.D.   On: 11/22/2023 16:17   CT Head Wo Contrast Result Date: 11/22/2023 EXAM: CT HEAD WITHOUT CONTRAST 11/22/2023 10:13:43 AM TECHNIQUE: CT of the head was performed without the  administration of intravenous contrast. Automated exposure control, iterative reconstruction, and/or weight based adjustment of the mA/kV was utilized to reduce the radiation dose to as low as reasonably achievable. COMPARISON: Brain MRI 12/18/2022. Head CT 12/16/2022. CLINICAL HISTORY: 63 year old male with minor head trauma, not acting baseline, reports SOB, unsteady gait, and chest pain. Fell last night. FINDINGS: BRAIN AND VENTRICLES: No acute hemorrhage. No evidence of acute infarct. No hydrocephalus. No extra-axial collection. No mass effect or midline shift. Stable cerebral volume since last year, overall normal for age. Patchy periventricular white matter hypodensity is mild by CT and stable. No suspicious intracranial vascular hyperdensity. Advanced calcified atherosclerosis at the skull base. ORBITS: No acute abnormality. SINUSES: Mild chronic left lamina papyracea fracture is stable. Minor chronic paranasal sinus mucosal thickening is stable. SOFT TISSUES AND SKULL: No acute soft tissue abnormality. No skull fracture. IMPRESSION: 1. No acute intracranial abnormality or acute traumatic injury. 2. Mild for age chronic white matter disease. Electronically signed by: Helayne Hurst MD 11/22/2023 10:28 AM EDT RP Workstation: [id number]   DG Chest 2 View Result Date: 11/22/2023 EXAM: 2 VIEW(S) XRAY OF THE CHEST 11/22/2023 09:44:00 AM COMPARISON: av radiograph of the chest dated 12/22/2018. CLINICAL HISTORY: cp. Staff reports patient is not acting his baseline. Reports SOB and unsteady gait. Baseline uses no assistance to get around.  Since arriving to ER, patient is now complaining of chest pain ; ; Patient reports he fell last night. Unsure how fall occurred. FINDINGS: LINES, TUBES AND DEVICES: Right chest wall dual lead cardiac pacing device in place. LUNGS AND PLEURA: Hazy opacity at left lung base, may represent atelectasis or infection. No pulmonary edema. No pleural effusion. No pneumothorax. HEART AND  MEDIASTINUM: No acute abnormality of the cardiac and mediastinal silhouettes. BONES AND SOFT TISSUES: Multilevel degenerative changes of thoracic spine. IMPRESSION: 1. Hazy opacity at left lung base, possibly atelectasis or infection. No pleural effusion or pneumothorax. Electronically signed by: Evalene Coho MD 11/22/2023 09:49 AM EDT RP Workstation: [id number]    Microbiology: Results for orders placed or performed during the hospital encounter of 11/22/23  Resp panel by RT-PCR (RSV, Flu A&B, Covid) Anterior Nasal Swab     Status: None   Collection Time: 11/22/23 12:34 PM   Specimen: Anterior Nasal Swab  Result Value Ref Range Status   SARS Coronavirus 2 by RT PCR NEGATIVE NEGATIVE Final    Comment: (NOTE) SARS-CoV-2 target nucleic acids are NOT DETECTED.  The SARS-CoV-2 RNA is generally detectable in upper respiratory specimens during the acute phase of infection. The lowest concentration of SARS-CoV-2 viral copies this assay can detect is 138 copies/mL. A negative result does not preclude SARS-Cov-2 infection and should not be used as the sole basis for treatment or other patient management decisions. A negative result may occur with  improper specimen collection/handling, submission of specimen other than nasopharyngeal swab, presence of viral mutation(s) within the areas targeted by this assay, and inadequate number of viral copies(<138 copies/mL). A negative result must be combined with clinical observations, patient history, and epidemiological information. The expected result is Negative.  Fact Sheet for Patients:  BloggerCourse.com  Fact Sheet for Healthcare Providers:  SeriousBroker.it  This test is no t yet approved or cleared by the [place]  FDA and  has been authorized for detection and/or diagnosis of SARS-CoV-2 by FDA under an Emergency Use Authorization (EUA). This EUA will remain  in effect (meaning this  test can be used) for the duration of the COVID-19 declaration under Section 564(b)(1) of the Act, 21 U.S.C.section 360bbb-3(b)(1), unless the authorization is terminated  or revoked sooner.       Influenza A by PCR NEGATIVE NEGATIVE Final   Influenza B by PCR NEGATIVE NEGATIVE Final    Comment: (NOTE) The Xpert Xpress SARS-CoV-2/FLU/RSV plus assay is intended as an aid in the diagnosis of influenza from Nasopharyngeal swab specimens and should not be used as a sole basis for treatment. Nasal washings and aspirates are unacceptable for Xpert Xpress SARS-CoV-2/FLU/RSV testing.  Fact Sheet for Patients: BloggerCourse.com  Fact Sheet for Healthcare Providers: SeriousBroker.it  This test is not yet approved or cleared by the [place]  FDA and has been authorized for detection and/or diagnosis of SARS-CoV-2 by FDA under an Emergency Use Authorization (EUA). This EUA will remain in effect (meaning this test can be used) for the duration of the COVID-19 declaration under Section 564(b)(1) of the Act, 21 U.S.C. section 360bbb-3(b)(1), unless the authorization is terminated or revoked.     Resp Syncytial Virus by PCR NEGATIVE NEGATIVE Final    Comment: (NOTE) Fact Sheet for Patients: BloggerCourse.com  Fact Sheet for Healthcare Providers: SeriousBroker.it  This test is not yet approved or cleared by the [place]  FDA and has been authorized for detection and/or diagnosis of SARS-CoV-2 by FDA under an Emergency Use Authorization (EUA). This EUA will  remain in effect (meaning this test can be used) for the duration of the COVID-19 declaration under Section 564(b)(1) of the Act, 21 U.S.C. section 360bbb-3(b)(1), unless the authorization is terminated or revoked.  Performed at [facility], [address] [place], [place] [address]   Blood culture (routine x 2)      Status: None (Preliminary result)   Collection Time: 11/22/23  5:32 PM   Specimen: BLOOD  Result Value Ref Range Status   Specimen Description BLOOD BLOOD LEFT ARM  Final   Special Requests   Final    BOTTLES DRAWN AEROBIC AND ANAEROBIC Blood Culture results may not be optimal due to an inadequate volume of blood received in culture bottles   Culture   Final    NO GROWTH < 12 HOURS Performed at [facility], [address] [place], [place] [address]    Report Status PENDING  Incomplete  Blood culture (routine x 2)     Status: None (Preliminary result)   Collection Time: 11/22/23  5:32 PM   Specimen: BLOOD  Result Value Ref Range Status   Specimen Description BLOOD BLOOD LEFT ARM  Final   Special Requests   Final    BOTTLES DRAWN AEROBIC AND ANAEROBIC Blood Culture results may not be optimal due to an inadequate volume of blood received in culture bottles   Culture   Final    NO GROWTH < 12 HOURS Performed at [facility], [address] [place], [place] [address]    Report Status PENDING  Incomplete    Labs: CBC: Recent Labs  Lab 11/22/23 0929 11/23/23 1108  WBC 11.6* 6.1  HGB 9.5* 9.6*  HCT 29.1* 29.9*  MCV 95.7 96.8  PLT 143* 149*   Basic Metabolic Panel: Recent Labs  Lab 11/22/23 0929 11/23/23 1108  NA 138 137  K 3.9 3.8  CL 104 106  CO2 24 24  GLUCOSE 89 177*  BUN 40* 31*  CREATININE 1.57* 1.53*  CALCIUM  9.1 8.9   Liver Function Tests: No results for input(s): AST, ALT, ALKPHOS, BILITOT, PROT, ALBUMIN in the last 168 hours. CBG: No results for input(s): GLUCAP in the last 168 hours.  Discharge time spent: greater than 30 minutes.  This record has been created using Conservation officer, historic buildings. Errors have been sought and corrected,but may not always be located. Such creation errors do not reflect on the standard of care.   Signed: Amaryllis Dare, MD [facility] 11/23/2023

## 2023-11-23 NOTE — TOC Transition Note (Signed)
 Transition of Care [facility]) - Discharge Note   Patient Details  Name: Frank Conley MRN: [id number] Date of Birth: 1960-07-18  Transition of Care [facility]) CM/SW Contact:  Dalia GORMAN Fuse, RN Phone Number: 11/23/2023, 2:44 PM   Clinical Narrative:     WONDA spoke with secretary at [facility] and asked to speak with Arland, she is out of office today. Secretary confirmed the patient is from a group home on [address] in [place]. TOC was transferred to [place] and LVMM requesting a call back.  TOC spoke with the patient and brother in the room. Maudie [phone] the nurse from [facility] is planning to come out to complete an eval and the patient will then return to the group home. His brother will drive him.  TOC faxed FL2 and DC Summary [phone]        Patient Goals and CMS Choice            Discharge Placement                       Discharge Plan and Services Additional resources added to the After Visit Summary for                                       Social Drivers of Health (SDOH) Interventions SDOH Screenings   Food Insecurity: No Food Insecurity (11/22/2023)  Housing: Low Risk  (11/22/2023)  Transportation Needs: No Transportation Needs (11/22/2023)  Utilities: Not At Risk (11/22/2023)  Financial Resource Strain: Low Risk  (12/28/2022)   Received from [facility]  Physical Activity: Inactive (12/21/2018)  Social Connections: Unknown (12/21/2018)  Stress: No Stress Concern Present (12/21/2018)  Tobacco Use: Low Risk  (11/22/2023)     Readmission Risk Interventions     No data to display

## 2023-11-23 NOTE — Progress Notes (Signed)
 Frank Conley is a 63 y.o. Male who presents today for Examination and Adult Prophylaxis Appointment. Medical History Reviewed/Updated:  Problem List[1] Past Medical History[2] Current Medications[3] Allergies[4]  Vitals:   11/26/23 1033  BP: 166/88  BP Site: R Wrist  Pulse: 78     Patient presents for Adult Prophylaxis Appointment.     Med Hx Reviewed: Pt and caregiver reports no changes.   Social History: Patient lives in Group Home Facility([facility]) and presents with home staff Frank Conley (caregiver) Premedication: none required or taken   Radiographs on File: PAs: BW's:05/24/2023 FMX: PAN:     Chief Oral Complaint: No current dental concerns or pain  Oral Habits: none reported Eo/Io Exam: Gingivitis Oral Cancer Screening:neg   Perio Probing Depths- previously, none today, sensitivity and bleeding.      Treatment Provided:  Pt presents for Adult Prophy Appointment.   Cavitron, Hand scaled with scalers, curettes, rubber cup polish, FM Fluoride Varnish applied to all teeth as a courtesy. and OHI.  Patient Oral Hygiene: Good/Fair Gingiva: Gingivitis  Plaque present: light Calculus present: light esp  LAs and loc sub  Bleeding:  Mod with use of Cavitron and scaling.  Pt tolerated treatment as well as possible. Recession/abrasion/Demineralization: Evident with no reported sensitivity today.  Extrinsic stains: mild   Oral Hygiene Instructions: Pt and caregiver are brushing with a manual toothbrush 2x daily and alternates with an electric toothbrush. Instructed to replace toothbrush/heads every 3 months. Also encouraged patient to rinse with an antiseptic mouthwash afterwards.  Reinforced nightly flossing with floss piks/interdental brushes.  Patient was provided with a Soft toothbrush, floss, [employer] Toothpaste sample   Recall: 6 months               Dental Examination: Frank Conley CORPUS NOTES: No new caries, existing restorations are in tact.                          Dental Hygiene Provider: Mila Conley, [facility], [place]        [1] Patient Active Problem List Diagnosis  . Actinic keratosis  . Hypertension, benign  . Encephalopathy  . Epileptic seizures    (CMS-HCC)  . Major depressive disorder, single episode, severe, with psychotic behavior    (CMS-HCC)  . Mild intellectual disability  . Malignant hyperthermia  . Obesity due to excess calories  . Obsessive-compulsive disorder  . Pervasive developmental disorder (HHS-HCC)  . Anemia  . Hemorrhoid  . Intention tremor  . Gait disturbance  . Seizures    (CMS-HCC)  . Enuresis, primary, functional  . Colon polyps seen on colonoscopy 07/2013  . History of nonmelanoma skin cancer  . Medication monitoring encounter  . Tremor  . Gait instability  . Thrombocytopenia  . Altered mental status  . Dysarthria  . Unsteady gait  . Pancytopenia (CMS-HCC)  . Septic shock    (CMS-HCC)  . 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 Risk for falls  . Onychomycosis  . MGUS (monoclonal gammopathy of unknown significance)  . Myopia with astigmatism and presbyopia, bilateral  . Age-related nuclear cataract of both eyes  . Early dry stage nonexudative age-related macular degeneration of both eyes  . CKD (chronic kidney disease) stage 3, GFR 30-59 ml/min (CMS-HCC)  . COVID-19 virus infection  . Sick sinus syndrome    (CMS-HCC)  . TIA involving carotid artery  . Bilateral carotid artery stenosis  .  Hemiplegia affecting left nondominant side    (CMS-HCC)  . Hypomagnesemia  . Iron deficiency  [2] Past Medical History: Diagnosis Date  . Adjustment disorder lifelong   currently with any change in routine  . Age-related nuclear cataract of both eyes 07/18/2017  . Anemia March 31   low hemoglobin in past but appears pale  . Anxiety    ongoing - under psychiatric care  . Arthritis 02/07/2007   significant degen spine and hips  . Autism spectrum disorder (HHS-HCC) childhood   Pervasive Developmental Disorders   . Basal cell carcinoma    scalp,face  . Binge eating adulthood ?  SABRA Cancer    (CMS-HCC) 02/07/2011   basal cell, scalp  . CKD (chronic kidney disease) stage 3, GFR 30-59 ml/min (CMS-HCC) 12/20/2018  . Cognitive impairment    mild MR, pervasive devel disorders  . Coronary artery disease    Pacemaker  . Current Outpatient Treatment jan 2012   STEP then [facility]  . Delirium 2010-11  . Depression 02/07/2008   OCD, depression, anxiety  . Depressive disorder 2010  . Developmental delay   . Gait abnormality   . Generalized anxiety disorder   . Hypertension 02/06/2010   currently under control (wt loss, exercise)  . Illiteracy and low-level literacy   . Intellectual disability    mild mental retardation; severe adaptation disorder  . Loss of consciousness    (CMS-HCC)    seizures  . Major depressive disorder   . Memory loss   . Mental/behavioral problem 02/06/2006   severe OCD; aggressive at times  . MHS (malignant hyperthermia)   . 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 Obesity ongoing  . Obsessive-compulsive disorder early childhood   reported by family; diagnosis as adult  . Oppositional defiant disorder   . Pancytopenia (CMS-HCC) 10/31/2014  . Prior Outpatient Treatment/Testing 2010-11   [place]  . Problem with transportation    requires continuous 1:1 due to behaviors  . Psychiatric Hospitalizations Sept 2011   stabilization following neuroleptic syndroms  . Raynaud disease   . Risk for falls 02/10/2015  . Schizoaffective disorder    (CMS-HCC)    see above  . Seizures    (CMS-HCC) early childhood   epilepsy; last episode approx 1 year ago  . 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 Venous insufficiency   . Violence/Aggression    family reports occasional occurences lifelong  . Visual impairment    corrected effectively with bifocals  [3] Current Outpatient Medications  Medication  Sig Dispense Refill  . aspirin  (ECOTRIN) 81 MG tablet TAKE 1 TABLET BY MOUTH ONCE DAILY SWALLOW WHOLE 90 tablet 0  . clonazePAM  (KLONOPIN ) 0.5 MG tablet Take 1 tablet (0.5 mg total) by mouth Three (3) times a day. 90 tablet 0  . clonazePAM  (KLONOPIN ) 0.5 MG tablet Take 1 tablet (0.5 mg total) by mouth Three (3) times a day. 90 tablet 0  . cloNIDine  (CATAPRES -TTS) 0.2 mg/24 hr Place 1 patch on the skin once a week. 12 patch 3  . cyanocobalamin, vitamin B-12, 1000 MCG tablet TAKE 1 TABLET BY MOUTH ONCE DAILY 30 tablet 0  . FEROSUL 325 mg (65 mg iron) tablet TAKE 1 TABLET BY MOUTH EVERY OTHER DAY 45 tablet 0  . FISH OIL 300-1,000 mg capsule TAKE 2 CAPSULES BY MOUTH ONCE DAILY FOR SUPPLEMENT 90 capsule 0  . FISH OIL 340-1,000 mg capsule TAKE 2 CAPSULES BY MOUTH  ONCE DAILY FOR SUPPLEMENT 60 capsule 5  . lamoTRIgine  (LAMICTAL ) 100 MG tablet Take 1 tablet (100 mg total) by mouth two (2) times a day. 180 tablet 1  . lamoTRIgine  (LAMICTAL ) 25 MG tablet Take 2 tablets by mouth daily in addition to 100 mg tablet daily for total morning dose of 150 mg. Take 3 tablets by mouth once nightly in addition to 100 mg tablet for a total nightly dose of 175 mg at bedtime 360 tablet 1  . loratadine  (CLARITIN ) 10 mg tablet TAKE 1 TABLET BY MOUTH ONCE DAILY FOR ALLERGY SYMPTOMS 90 tablet 0  . melatonin 3 mg Tab TAKE 1 TABLET BY MOUTH IN THE EVENING AT5PM 90 tablet 3  . miscellaneous medical supply Misc 2 pairs compression hose to be worn as needed for leg swelling 2 each 0  . MUCINEX  600 mg 12 hr tablet TAKE 1 TABLET BY MOUTH 2 TIMES A DAY 10 tablet 0  . omega 3-dha-epa-fish oil 300 mg (120 mg- 180mg )-1,000 mg cap TAKE 2 CAPSULES BY MOUTH ONCE DAILY FOR SUPPLEMENT 90 capsule 3  . polyethylene glycol (GLYCOLAX ) 17 gram/dose powder MIX 17 GRAMS IN 8 OZ OF WATER/JUICE AND DRINK TWICE A DAY - HOLD FOR LOOSE STOOLS. MAY GIVE UP TO 3 TIMES DAILY PERNURSING GUIDELINES 510 g 0  . PRESERVISION AREDS-2 250-90-40-1 mg cap TAKE 1  CAPSULE BY MOUTH 2 TIMES PER DAY 60 capsule 11  . QUEtiapine  (SEROQUEL ) 100 MG tablet Take 1.5 tablets (150 mg total) by mouth Three (3) times a day. 405 tablet 1  . QUEtiapine  (SEROQUEL ) 25 MG tablet Take 1 tablet (25 mg total) by mouth daily as needed (agitation) for up to 60 doses. 30 tablet 1  . rosuvastatin  (CRESTOR ) 20 MG tablet TAKE 1 TABLET BY MOUTH ONCE DAILY 90 tablet 0  . SENNA 8.6 mg tablet TAKE 2 TABLETS BY MOUTH NIGHTLY 60 tablet 0  . witch hazel-glycerin (TUCKS) pad Apply topically to the affected area as needed. (Patient taking differently: Apply topically to the affected area as needed. PRN) 40 each 12   No current facility-administered medications for this visit.  [4] Allergies Allergen Reactions  . Other Other (See Comments)    Malignant Hyperthermia Episode in 1988 per records. Avoid triggering agents. Anesthesia.  SABRA Hctz [Hydrochlorothiazide] Other (See Comments)    hyponatremia  . Lisinopril Other (See Comments)    Acute kidney injury: Creatinine increased by over 50%  . Levetiracetam   . Phenytoin   . Phenytoin Sodium Extended   . Risperidone Analogues

## 2023-11-23 NOTE — Telephone Encounter (Signed)
 CALLED PT TO CONFIRM APPT ON 10/20 LEFT VM

## 2023-11-23 NOTE — Evaluation (Signed)
 Physical Therapy Evaluation Patient Details Name: Frank Conley MRN: [id number] DOB: 18-Sep-1960 Today's Date: 11/23/2023  History of Present Illness  Pt is a 63 y.o. male presenting to ED for SOB & AMS with unwitnessed fall. MD assessment includes community acquired pneumonia, gait disturbance with hx of vertebral artery stenosis and internal carotid artery stenosis. PMH significant for developmental delay disorder, mild intellectual disability, pervasive developmental disorder, OCD, gait disturbance, tremor, SSS s/p pacemaker, dysarthria, vertebral artery stenosis, TIA with chronic L-sided weakness, CHF, MGUS, seizure, HTN, acute hypoxic respiratory failure with hypoxia, CKD, macular degeneration, cataracts.   Clinical Impression  Pt alert, agreeable to participate in PT evaluation. Per pt and his brother, at baseline pt is a household distance ambulator, typically with no AD, brother states pt has had multiple falls recently. Pt requires assistance from group home staff for ADLs. Pt was met semi-supine in bed, was found to have had a BM, CNA entered room to assist with pericare and bed linen change. ModA for rolling and to maintain sidelying with max multimodal cues for sequencing. Pt required modA at trunk for supine > sit transfer. Pt performed 2 STS from EOB with minA, +2 to stabilize RW. Pt able to stand to adjust his belt with fair balance. Pt amb ~79ft with RW, no overt LOB but was unsteady at times with L lateral lean throughout. Pt required intermittent minA to maintain RW within BOS, max VC provided for proper RW positioning. Pt was left seated at EOB at end of session, OT present in room, all needs in reach. Pt would benefit from skilled PT intervention to address listed deficits (see PT Problem List) and allow for safe return to PLOF.       If plan is discharge home, recommend the following: A little help with walking and/or transfers;A lot of help with  bathing/dressing/bathroom;Assistance with cooking/housework;Direct supervision/assist for medications management;Assist for transportation   Can travel by private vehicle        Equipment Recommendations None recommended by PT (pt has recommended DME)  Recommendations for Other Services       Functional Status Assessment Patient has had a recent decline in their functional status and demonstrates the ability to make significant improvements in function in a reasonable and predictable amount of time.     Precautions / Restrictions Precautions Precautions: Fall;ICD/Pacemaker Recall of Precautions/Restrictions: Intact Restrictions Weight Bearing Restrictions Per Provider Order: No      Mobility  Bed Mobility Overal bed mobility: Needs Assistance Bed Mobility: Rolling, Supine to Sit Rolling: Mod assist, Used rails   Supine to sit: Mod assist, HOB elevated     General bed mobility comments: ModA rolling for pericare and bed linen change, modA trunk assist to achieve sitting at EOB    Transfers Overall transfer level: Needs assistance Equipment used: Rolling walker (2 wheels) Transfers: Sit to/from Stand Sit to Stand: Min assist, +2 safety/equipment           General transfer comment: STS from EOB with minA to achieve full standing. Mod VC for proper hand placement on RW    Ambulation/Gait Ambulation/Gait assistance: Contact guard assist, Min assist Gait Distance (Feet): 75 Feet Assistive device: Rolling walker (2 wheels) Gait Pattern/deviations: Step-through pattern, Trunk flexed, Narrow base of support Gait velocity: decreased     General Gait Details: Unsteady at times but no overt LOB throughout, slight L lateral trunk lean. MinA required at times to keep RW close due to pt pushing walker forward outside BOS. Max VC  for RW positioning  Stairs            Wheelchair Mobility     Tilt Bed    Modified Rankin (Stroke Patients Only)       Balance  Overall balance assessment: Needs assistance Sitting-balance support: Feet supported Sitting balance-Leahy Scale: Good Sitting balance - Comments: steady static and dynamic sitting   Standing balance support: Bilateral upper extremity supported, No upper extremity supported, During functional activity Standing balance-Leahy Scale: Fair Standing balance comment: able to stand and adjust belt with fair balance, increased postural sway with no UE support                             Pertinent Vitals/Pain Pain Assessment Pain Assessment: No/denies pain    Home Living Family/patient expects to be discharged to:: Group home Living Arrangements: Group Home Available Help at Discharge: Other (Comment) (Staff at group home) Type of Home: Group Home Home Access: Level entry       Home Layout: One level Home Equipment: Agricultural consultant (2 wheels);Shower seat;Grab bars - tub/shower      Prior Function Prior Level of Function : Needs assist;History of Falls (last six months)             Mobility Comments: Pt reports ambulating with no AD prior to admission, pt's brother states that pt has had multiple falls recently. Brother states that he is mostly a household distance ambulator ADLs Comments: Pt requires assistance from group home staff for most ADLs     Extremity/Trunk Assessment   Upper Extremity Assessment Upper Extremity Assessment: Generalized weakness (hx of L-sided weakness)    Lower Extremity Assessment Lower Extremity Assessment: Generalized weakness    Cervical / Trunk Assessment Cervical / Trunk Assessment: Normal  Communication   Communication Communication: No apparent difficulties    Cognition Arousal: Alert Behavior During Therapy: WFL for tasks assessed/performed   PT - Cognitive impairments: History of cognitive impairments                       PT - Cognition Comments: pleasant and cooperative throughout session, hx of cognitive  deficts at baseline Following commands: Intact       Cueing Cueing Techniques: Verbal cues, Tactile cues, Visual cues     General Comments      Exercises     Assessment/Plan    PT Assessment Patient needs continued PT services  PT Problem List Decreased strength;Decreased activity tolerance;Decreased balance;Decreased mobility;Decreased cognition       PT Treatment Interventions DME instruction;Gait training;Functional mobility training;Therapeutic activities;Therapeutic exercise;Balance training;Neuromuscular re-education;Cognitive remediation;Patient/family education    PT Goals (Current goals can be found in the Care Plan section)  Acute Rehab PT Goals Patient Stated Goal: to go back to group home PT Goal Formulation: With patient Time For Goal Achievement: 12/07/23 Potential to Achieve Goals: Good    Frequency Min 2X/week     Co-evaluation PT/OT/SLP Co-Evaluation/Treatment: Yes Reason for Co-Treatment: Complexity of the patient's impairments (multi-system involvement);To address functional/ADL transfers PT goals addressed during session: Mobility/safety with mobility;Proper use of DME OT goals addressed during session: ADL's and self-care       AM-PAC PT 6 Clicks Mobility  Outcome Measure Help needed turning from your back to your side while in a flat bed without using bedrails?: A Lot Help needed moving from lying on your back to sitting on the side of a flat bed without using bedrails?: A  Lot Help needed moving to and from a bed to a chair (including a wheelchair)?: A Little Help needed standing up from a chair using your arms (e.g., wheelchair or bedside chair)?: A Little Help needed to walk in hospital room?: A Little Help needed climbing 3-5 steps with a railing? : A Little 6 Click Score: 16    End of Session Equipment Utilized During Treatment: Gait belt Activity Tolerance: Patient tolerated treatment well Patient left: in bed;with call bell/phone  within reach;with bed alarm set;with family/visitor present (OT in room, pt left sitting EOB) Nurse Communication: Mobility status PT Visit Diagnosis: Unsteadiness on feet (R26.81);Other abnormalities of gait and mobility (R26.89);Muscle weakness (generalized) (M62.81);History of falling (Z91.81);Difficulty in walking, not elsewhere classified (R26.2)    Time: 1324-1400 PT Time Calculation (min) (ACUTE ONLY): 36 min   Charges:   PT Evaluation $PT Eval Moderate Complexity: 1 Mod PT Treatments $Gait Training: 8-22 mins PT General Charges $$ ACUTE PT VISIT: 1 Visit         Qunisha Bryk, SPT

## 2023-11-23 NOTE — Hospital Course (Addendum)
 Partly taken from H&P.  Frank Conley is a 63 y.o. male with medical history significant of pervasive developmental disorder, OCD, depression, seizure, essential tremor, HTN, TIA, SSS (s/p of PPM), vertebral artery stenosis (evaluated in [facility], not good candidate for surgical treatment), chronic gait instability, MGUS, pancytopenia, dCHF, CKD 3A, who presents with SOB and gait instability.   Patient has a history of pervasive developmental disorder, cannot provide accurate medical history. Most of the history is obtained by discussing the case with ED physician, per EMS report, and with the nursing staff and talking with brother on phone.  Patient has a chronic gait instability and always leaning towards the left, normally can walk without assistance but yesterday patient was feeling more weaker and needing assistance to walk.  Per report, patient fell last night which is unwitnessed.  Does not seem to have any significant injury.  On presentation mildly elevated blood pressure at 171/83 otherwise stable vital, labs with leukocytosis at 11.6,negative PCR for COVID, flu and RSV, stable renal function, negative UA, troponin 4- > 4, D-dimer 0.67, chest x-ray with hazy opacity in left lung base, possibly atelectasis or infection. CT of head was negative for any acute abnormality  CTA chest was negative for PE, did shows mild airspace opacity with volume loss in the right middle lobe favoring atelectasis and several small ground glass opacities in the right middle lobe likely inflammatory, opacities in the left lower lobe with some component of atelectasis with some superadded pneumonia.  Incidental finding of a complex anterior left renal cystic lesion, concerning for any neoplasm with recommendation adrenal protocol MRI if pacer is compatible or a renal protocol CT with and without contrast.  CTA head and neck with widespread atherosclerosis without a LVO.  65% stenosis of right ICA, unchanged 75%  stenosis of left ICA, unchanged severe bilateral intracranial ICA stenosis and moderate to severe bilateral V1-V4 stenosis.  Patient was started on Rocephin  and Zithromax  for pneumonia.  10/17: Vital stable, on room air, lipid panel normal with LDL of 34, preliminary blood cultures negative in 12 hours, UA with mild protein urea, strep pneumo negative, BNP at 378.  Clinically appears euvolemic and remained on room air.  Also has an history of CKD stage III A, creatinine remained at baseline.  Lower extremity venous Doppler was negative.  Patient is being discharged back to his group home.  He need to have a close follow-up with PCP.  PCP should be able to order more imaging for better characterization of complex renal cyst.  Patient already received contrast on admission for CTA.  Repeat kidney function seems stable.  He should be encouraged for p.o. hydration.  Patient received ceftriaxone  and Zithromax  while in the hospital and is being discharged on Levaquin to complete a total of 5-day course for concern of pneumonia.  He will continue the rest of his home medications and follow-up with his providers for further assistance.

## 2023-11-23 NOTE — Progress Notes (Signed)
 PT Cancellation Note  Patient Details Name: Frank Conley MRN: [id number] DOB: [birth year]   Cancelled Treatment:    Reason Eval/Treat Not Completed: Other (comment): Orders received, chart reviewed. Pt currently with elevated D-dimer and pending US  to rule out acute DVT with MD contacted and requesting PT evaluation be held pending results of US .  Will attempt to see pt at a future date/time as medically appropriate.    CHARM Glendia Bertin PT, DPT 11/23/23, 9:26 AM

## 2023-11-23 NOTE — Evaluation (Addendum)
 Occupational Therapy Evaluation Patient Details Name: Frank Conley MRN: [id number] DOB: 23-Dec-1960 Today's Date: 11/23/2023   History of Present Illness   Pt is a 63 y.o. male presenting to ED for SOB & AMS with unwitnessed fall. MD assessment includes community acquired pneumonia, gait disturbance with hx of vertebral artery stenosis and internal carotid artery stenosis. PMH significant for developmental delay disorder, mild intellectual disability, pervasive developmental disorder, OCD, gait disturbance, tremor, SSS s/p pacemaker, dysarthria, vertebral artery stenosis, TIA with chronic L-sided weakness, CHF, MGUS, seizure, HTN, acute hypoxic respiratory failure with hypoxia, CKD, macular degeneration, cataracts.     Clinical Impressions Pt admitted with above. Supportive brother present throughout session. Pt received sitting EOB, with brother providing insight into PLOF. Pt lives at a group home with 24/7 care and supervision. He requires assistance for all ADLs, including cutting up food due to hx of aspiration. Typically pt is ambulatory without use of AD but has had multiple falls recently. Pt requires MAX A to don pants over hips and to fasten zipper, belt and button, MIN A +2 for safety for STS using RW. Dons shirt with MIN A, cues for orientation and to assist with pulling over head for efficiency. Pt completes functional mobility with RW ~75 ft in hallway, cues for AD proximity and intermittent MIN A for stability with L lateral lean. Per brother, pt is at his baseline for ADL performance and plans to continue outpatient PT services at discharge to work on ambulation and balance. No further acute OT needs identified, OT will sign off.      If plan is discharge home, recommend the following:   A little help with walking and/or transfers;A lot of help with bathing/dressing/bathroom;Supervision due to cognitive status;Help with stairs or ramp for entrance;Assist for transportation;Direct  supervision/assist for financial management;Direct supervision/assist for medications management;Assistance with feeding;Assistance with cooking/housework     Functional Status Assessment   Patient has not had a recent decline in their functional status     Equipment Recommendations   None recommended by OT      Precautions/Restrictions   Precautions Precautions: Fall;ICD/Pacemaker Recall of Precautions/Restrictions: Impaired Restrictions Weight Bearing Restrictions Per Provider Order: No     Mobility Bed Mobility Overal bed mobility: Needs Assistance             General bed mobility comments: Pt recieved sitting EOB with PT    Transfers Overall transfer level: Needs assistance Equipment used: Rolling walker (2 wheels) Transfers: Sit to/from Stand Sit to Stand: Min assist, +2 safety/equipment           General transfer comment: STS from EOB with minA to achieve full standing.      Balance Overall balance assessment: Needs assistance Sitting-balance support: Feet supported Sitting balance-Leahy Scale: Good Sitting balance - Comments: steady static and dynamic sitting   Standing balance support: Bilateral upper extremity supported, No upper extremity supported, During functional activity Standing balance-Leahy Scale: Fair Standing balance comment: able to stand and adjust belt with fair balance, increased postural sway with no UE support                           ADL either performed or assessed with clinical judgement   ADL Overall ADL's : Needs assistance/impaired Eating/Feeding: Minimal assistance;Sitting;With caregiver independent assisting Eating/Feeding Details (indicate cue type and reason): lunch tray arrives, pt needs assist at baseline to cut up foods 2/2 hx of dysphagia  Upper Body Dressing : Minimal assistance;Sitting Upper Body Dressing Details (indicate cue type and reason): cues to orient shirt Lower Body  Dressing: Maximal assistance;Sit to/from stand Lower Body Dressing Details (indicate cue type and reason): requires assist to pull pants over hips, and fasten belt Toilet Transfer: Minimal assistance;Ambulation;Rolling walker (2 wheels);+2 for safety/equipment   Toileting- Clothing Manipulation and Hygiene: Maximal assistance       Functional mobility during ADLs: Minimal assistance;+2 for safety/equipment;Cueing for safety;Cueing for sequencing;Rolling walker (2 wheels)       Vision Baseline Vision/History: 1 Wears glasses Ability to See in Adequate Light: 0 Adequate Patient Visual Report: No change from baseline              Pertinent Vitals/Pain Pain Assessment Pain Assessment: No/denies pain     Extremity/Trunk Assessment Upper Extremity Assessment Upper Extremity Assessment: Generalized weakness (hx of L-sided weakness)   Lower Extremity Assessment Lower Extremity Assessment: Generalized weakness   Cervical / Trunk Assessment Cervical / Trunk Assessment: Normal   Communication Communication Communication: No apparent difficulties   Cognition Arousal: Alert Behavior During Therapy: WFL for tasks assessed/performed Cognition: History of cognitive impairments                               Following commands: Intact       Cueing  General Comments   Cueing Techniques: Verbal cues;Tactile cues;Visual cues              Home Living Family/patient expects to be discharged to:: Group home Living Arrangements: Group Home Available Help at Discharge: Other (Comment) (Staff at group home) Type of Home: Group Home Home Access: Level entry     Home Layout: One level     Bathroom Shower/Tub: Producer, television/film/video: Standard     Home Equipment: Agricultural consultant (2 wheels);Shower seat;Grab bars - tub/shower          Prior Functioning/Environment Prior Level of Function : Needs assist;History of Falls (last six months)              Mobility Comments: Pt reports ambulating with no AD prior to admission, pt's brother states that pt has had multiple falls recently. Brother states that he is mostly a household distance ambulator ADLs Comments: Pt requires assistance from group home staff for most ADLs    OT Problem List: Decreased strength;Decreased range of motion;Decreased activity tolerance;Impaired balance (sitting and/or standing);Decreased cognition;Decreased safety awareness;Decreased knowledge of use of DME or AE;Decreased knowledge of precautions        OT Goals(Current goals can be found in the care plan section)   Acute Rehab OT Goals OT Goal Formulation: All assessment and education complete, DC therapy Potential to Achieve Goals: Good   AM-PAC OT 6 Clicks Daily Activity     Outcome Measure Help from another person eating meals?: A Little Help from another person taking care of personal grooming?: A Little Help from another person toileting, which includes using toliet, bedpan, or urinal?: A Lot Help from another person bathing (including washing, rinsing, drying)?: A Lot Help from another person to put on and taking off regular upper body clothing?: A Lot Help from another person to put on and taking off regular lower body clothing?: A Lot 6 Click Score: 14   End of Session Equipment Utilized During Treatment: Gait belt;Rolling walker (2 wheels) Nurse Communication: Mobility status  Activity Tolerance: Patient tolerated treatment well Patient left: in bed;with  call bell/phone within reach;with bed alarm set;with family/visitor present  OT Visit Diagnosis: Unsteadiness on feet (R26.81);Other abnormalities of gait and mobility (R26.89);Muscle weakness (generalized) (M62.81);History of falling (Z91.81);Cognitive communication deficit (R41.841) Symptoms and signs involving cognitive functions: Other cerebrovascular disease                Time: 1343-1406 OT Time Calculation (min): 23  min Charges:  OT General Charges $OT Visit: 1 Visit OT Evaluation $OT Eval Moderate Complexity: 1 Mod  Haylen Bellotti L. Brittay Mogle, OTR/L  11/23/23, 4:47 PM

## 2023-11-23 NOTE — Progress Notes (Signed)
 SLP Cancellation Note  Patient Details Name: JAHAAD PENADO MRN: [id number] DOB: 04/01/1960   Cancelled treatment:       Reason Eval/Treat Not Completed: SLP screened, no needs identified, will sign off   Review of EMR notes patient admitted with initial concern for possible TIA. Baseline history of cognitive impairment and MD in agreement formal assessment not indicated. Further workup revealed CAP. Passed initial dysphagia screen and no concerns with current unrestricted diet texture. MD in agreement no SLP needs indicated at this time.   Rosaline CHRISTELLA Boas 11/23/2023, 9:10 AM

## 2023-11-25 LAB — LEGIONELLA PNEUMOPHILA SEROGP 1 UR AG: L. pneumophila Serogp 1 Ur Ag: NEGATIVE

## 2023-11-27 LAB — CULTURE, BLOOD (ROUTINE X 2)
Culture: NO GROWTH
Culture: NO GROWTH

## 2023-11-29 ENCOUNTER — Emergency Department

## 2023-11-29 ENCOUNTER — Other Ambulatory Visit: Payer: Self-pay

## 2023-11-29 ENCOUNTER — Emergency Department
Admission: EM | Admit: 2023-11-29 | Discharge: 2023-11-29 | Disposition: A | Discharge status: Home / Self Care | Source: Other Acute Inpatient Hospital | Attending: Emergency Medicine | Admitting: Emergency Medicine

## 2023-11-29 DIAGNOSIS — R404 Transient alteration of awareness: Secondary | ICD-10-CM | POA: Insufficient documentation

## 2023-11-29 DIAGNOSIS — N189 Chronic kidney disease, unspecified: Secondary | ICD-10-CM | POA: Diagnosis not present

## 2023-11-29 DIAGNOSIS — R4182 Altered mental status, unspecified: Secondary | ICD-10-CM | POA: Diagnosis present

## 2023-11-29 DIAGNOSIS — I130 Hypertensive heart and chronic kidney disease with heart failure and stage 1 through stage 4 chronic kidney disease, or unspecified chronic kidney disease: Secondary | ICD-10-CM | POA: Insufficient documentation

## 2023-11-29 DIAGNOSIS — Z950 Presence of cardiac pacemaker: Secondary | ICD-10-CM | POA: Insufficient documentation

## 2023-11-29 DIAGNOSIS — R258 Other abnormal involuntary movements: Secondary | ICD-10-CM | POA: Insufficient documentation

## 2023-11-29 DIAGNOSIS — I5030 Unspecified diastolic (congestive) heart failure: Secondary | ICD-10-CM | POA: Diagnosis not present

## 2023-11-29 DIAGNOSIS — R569 Unspecified convulsions: Secondary | ICD-10-CM

## 2023-11-29 LAB — URINALYSIS, W/ REFLEX TO CULTURE (INFECTION SUSPECTED)
Bacteria, UA: NONE SEEN
Bilirubin Urine: NEGATIVE
Glucose, UA: NEGATIVE mg/dL
Hgb urine dipstick: NEGATIVE
Ketones, ur: NEGATIVE mg/dL
Leukocytes,Ua: NEGATIVE
Nitrite: NEGATIVE
Protein, ur: 100 mg/dL — AB
RBC / HPF: 0 RBC/hpf (ref 0–5)
Specific Gravity, Urine: 1.014 (ref 1.005–1.030)
Squamous Epithelial / HPF: 0 /HPF (ref 0–5)
pH: 5 (ref 5.0–8.0)

## 2023-11-29 LAB — BASIC METABOLIC PANEL WITH GFR
Anion gap: 11 (ref 5–15)
BUN: 25 mg/dL — ABNORMAL HIGH (ref 8–23)
CO2: 25 mmol/L (ref 22–32)
Calcium: 9 mg/dL (ref 8.9–10.3)
Chloride: 103 mmol/L (ref 98–111)
Creatinine, Ser: 1.58 mg/dL — ABNORMAL HIGH (ref 0.61–1.24)
GFR, Estimated: 49 mL/min — ABNORMAL LOW (ref >60)
Glucose, Bld: 82 mg/dL (ref 70–99)
Potassium: 4.2 mmol/L (ref 3.5–5.1)
Sodium: 139 mmol/L (ref 135–145)

## 2023-11-29 LAB — RESP PANEL BY RT-PCR (RSV, FLU A&B, COVID)  RVPGX2
Influenza A by PCR: NEGATIVE
Influenza B by PCR: NEGATIVE
Resp Syncytial Virus by PCR: NEGATIVE
SARS Coronavirus 2 by RT PCR: NEGATIVE

## 2023-11-29 LAB — CBC
HCT: 31.9 % — ABNORMAL LOW (ref 39.0–52.0)
Hemoglobin: 10.1 g/dL — ABNORMAL LOW (ref 13.0–17.0)
MCH: 30.8 pg (ref 26.0–34.0)
MCHC: 31.7 g/dL (ref 30.0–36.0)
MCV: 97.3 fL (ref 80.0–100.0)
Platelets: 188 K/uL (ref 150–400)
RBC: 3.28 MIL/uL — ABNORMAL LOW (ref 4.22–5.81)
RDW: 15.4 % (ref 11.5–15.5)
WBC: 5.1 K/uL (ref 4.0–10.5)
nRBC: 0 % (ref 0.0–0.2)

## 2023-11-29 LAB — TROPONIN I (HIGH SENSITIVITY): Troponin I (High Sensitivity): 2 ng/L (ref <18)

## 2023-11-29 NOTE — Progress Notes (Signed)
 SENT TO ED. HX OF TIA ALTERED MENTAL STATUS FROM USUAL  INCREASED IRRITABILITY

## 2023-11-29 NOTE — Discharge Instructions (Signed)
 You were seen in the emergency department following an episode of altered mental status with generalized weakness and confusion.  You had a CT scan of your head that did not show any new abnormalities.  Your lab work was overall at your normal.  You did not have any findings of pneumonia on your chest x-ray and you did not have any findings of a urinary tract infection.  Concerned that you could have had a seizure today, concern for breakthrough seizure.  It was discussed with her neurologist Dr. Matthews who recommended continuing on your lamotrigine  as scheduled.  Call your neurologist at [facility] to schedule a close follow-up appointment to discuss breakthrough seizure and possible need for medication changes.  Follow-up with your primary care physician for the concerning findings of possible cancer on your kidney.  Return to the emergency department for any new or worsening symptoms.

## 2023-11-29 NOTE — ED Notes (Signed)
 Marinda called back, RN spoke to legal guardian regarding discharge.

## 2023-11-29 NOTE — ED Provider Notes (Signed)
 [facility] Provider Note    Event Date/Time   First MD Initiated Contact with Patient 11/29/23 1614     (approximate)   History   Altered Mental Status   HPI  Frank Conley is a 63 y.o. male past medical history significant for developmental disorder, OCD, depression, seizures, essential tremor, hypertension, TIA, sick sinus syndrome with history of pacemaker, vertebral artery stenosis with no good surgical options, chronic gait instability, pancytopenia, HFpEF, CKD, MGUS, presents to the emergency department with altered mental status.  Group home today and had an episode of altered mental status.  Since arriving to the emergency department he is now back in his normal mental status.  Caregiver at bedside states that he is not sure what happened and he was not present.  States that he will call someone to get more information.  Discussed with Arland who is present at the day home over the phone -stated that she has known the patient for approximately 15 years.  Had an episode where he had altered mental status and was confused and acting intoxicated, was having significant weakness to both of his lower extremities and slurring his words.  Was not understanding what they were saying.  This episode lasted approximately 45 minutes.  Denies any new falls or head trauma.  No new medications.  States that he has had a history of TIA.  States that this has been similar for 1 prior episode of a mini stroke.  States that he recently was told that he has a possible neoplasm of his kidney and needed an outpatient MRI for further evaluation.  Scheduled for November.  On chart review patient was discharged on 11/23/2023 following a hospital admission for TIA, patient had a CTA of his chest that showed no signs of pulmonary embolism.  Showed a questionable pneumonia and he was treated with Levaquin.  Patient also had a CTA of his head and neck that showed unchanged area of  stenosis.     Physical Exam   Triage Vital Signs: ED Triage Vitals [11/29/23 1116]  Encounter Vitals Group     BP (!) 169/79     Girls Systolic BP Percentile      Girls Diastolic BP Percentile      Boys Systolic BP Percentile      Boys Diastolic BP Percentile      Pulse Rate (!) 59     Resp 17     Temp 97.8 F (36.6 C)     Temp Source Oral     SpO2 100 %     Weight 179 lb 7.3 oz (81.4 kg)     Height 5' 8 (1.727 m)     Head Circumference      Peak Flow      Pain Score 0     Pain Loc      Pain Education      Exclude from Growth Chart     Most recent vital signs: Vitals:   11/29/23 1116 11/29/23 1540  BP: (!) 169/79 (!) 146/104  Pulse: (!) 59 65  Resp: 17 16  Temp: 97.8 F (36.6 C) 97.8 F (36.6 C)  SpO2: 100% 100%    Physical Exam Constitutional:      Appearance: He is well-developed.  HENT:     Head: Atraumatic.  Eyes:     Conjunctiva/sclera: Conjunctivae normal.     Comments: No nystagmus  Cardiovascular:     Rate and Rhythm: Regular rhythm.  Pulmonary:  Effort: No respiratory distress.  Musculoskeletal:        General: Normal range of motion.     Cervical back: Normal range of motion.     Right lower leg: No edema.     Left lower leg: No edema.  Skin:    General: Skin is warm.     Capillary Refill: Capillary refill takes less than 2 seconds.  Neurological:     Mental Status: He is alert. Mental status is at baseline.     GCS: GCS eye subscore is 4. GCS verbal subscore is 5. GCS motor subscore is 6.     Cranial Nerves: Cranial nerves 2-12 are intact.     Sensory: Sensation is intact.     Motor: Motor function is intact.     Coordination: Coordination is intact.  Psychiatric:        Mood and Affect: Mood normal.     IMPRESSION / MDM / ASSESSMENT AND PLAN / ED COURSE  I reviewed the triage vital signs and the nursing notes.  Differential diagnosis including electrolyte abnormality, dehydration, TIA/CVA, intracranial hemorrhage, seizure,  dysrhythmia  Patient is now back to baseline and has a nonfocal neurologic exam.  Afebrile and hemodynamically stable.  On chart review had a recent hospitalization for TIA workup I do not see that had an MRI at that time but has had an MRI in the past.  Did have a CTA and had a CTA chest showed no signs of pulmonary embolism.  At that time of his head and neck that did not show any signs of dissection or worsening stenosis  EKG  I, Clotilda Punter, the attending physician, personally viewed and interpreted this ECG.   Rate: Normal  Rhythm: Normal sinus  Axis: Normal  Intervals: Normal  ST&T Change: None  No tachycardic or bradycardic dysrhythmias while on cardiac telemetry.  RADIOLOGY I independently reviewed imaging, my interpretation of imaging: Chest x-ray showed no signs of pneumonia  CT scan showed stable CT scan of the head with no acute process.  LABS (all labs ordered are listed, but only abnormal results are displayed) Labs interpreted as -    Labs Reviewed  CBC - Abnormal; Notable for the following components:      Result Value   RBC 3.28 (*)    Hemoglobin 10.1 (*)    HCT 31.9 (*)    All other components within normal limits  BASIC METABOLIC PANEL WITH GFR - Abnormal; Notable for the following components:   BUN 25 (*)    Creatinine, Ser 1.58 (*)    GFR, Estimated 49 (*)    All other components within normal limits  URINALYSIS, W/ REFLEX TO CULTURE (INFECTION SUSPECTED) - Abnormal; Notable for the following components:   Color, Urine YELLOW (*)    APPearance CLEAR (*)    Protein, ur 100 (*)    All other components within normal limits  RESP PANEL BY RT-PCR (RSV, FLU A&B, COVID)  [id number]  TROPONIN I (HIGH SENSITIVITY)     MDM  Patient presented to the emergency department following an episode of altered mental status with confusion and weakness.  Episode has resolved and he is back to his baseline.  Able to ambulate in the emergency department and has a  nonfocal neurologic exam.  CT scan of the head with no signs of intracranial hemorrhage or infarction.  Chest x-ray with no signs of pneumonia.  Lab work overall reassuring with no significant electrolyte abnormalities.  No signs of a  urinary tract infection.  COVID and influenza testing are negative.  Discussed the patient's case with Dr. Matthews with neurology given concern for possible TIA versus breakthrough seizure.  Clinical picture is most consistent with breakthrough seizure.  Did not recommend changing his dose of lamotrigine  and to continue on given his recent infection most likely breakthrough seizure in the setting of a pneumonia.  Recommended following up closely with his primary care physician and with [facility] neurolog[facility] at [facility].  Discussed the discharge plan with caregiver at bedside.  Discharged back to group home in stable condition.  No questions at time of discharge.     PROCEDURES:  Critical Care performed: No  Procedures  Patient's presentation is most consistent with acute presentation with potential threat to life or bodily function.   MEDICATIONS ORDERED IN ED: Medications - No data to display  FINAL CLINICAL IMPRESSION(S) / ED DIAGNOSES   Final diagnoses:  Transient alteration of awareness  Observed seizure-like activity (HCC)     Rx / DC Orders   ED Discharge Orders     None        Note:  This document was prepared using Dragon voice recognition software and may include unintentional dictation errors.   Suzanne Kirsch, MD 11/29/23 [phone]

## 2023-11-29 NOTE — ED Notes (Signed)
 Ambulated Pt with walker, steady gait. Pts and caregiver both report Pt is walking @baseline 

## 2023-11-29 NOTE — ED Triage Notes (Signed)
 Pt arrives via Wheelchair from [facility] Per pt caregiver, pt is acting at baseline however the staff at the day program said that pt was not acting right and very lethargic. Pt is very talkative with staff and interactive.

## 2023-12-19 ENCOUNTER — Ambulatory Visit (INDEPENDENT_AMBULATORY_CARE_PROVIDER_SITE_OTHER): Admitting: Podiatry

## 2023-12-19 DIAGNOSIS — D239 Other benign neoplasm of skin, unspecified: Secondary | ICD-10-CM | POA: Diagnosis not present

## 2023-12-19 DIAGNOSIS — B351 Tinea unguium: Secondary | ICD-10-CM

## 2023-12-19 DIAGNOSIS — M79609 Pain in unspecified limb: Secondary | ICD-10-CM

## 2023-12-19 NOTE — Progress Notes (Signed)
 Subjective:  Patient ID: Frank Conley, male    DOB: 31-Oct-1960,  MRN: [id number] HPI Chief Complaint  Patient presents with   Callouses    Callous on foot great toe area    63 y.o. male presents with the above complaint.   ROS: Denies fever chills nausea mobic muscle aches pains calf pain back pain chest pain shortness of breath.  Past Medical History:  Diagnosis Date   Depression    Hypertension    MGUS (monoclonal gammopathy of unknown significance)    Pervasive developmental disorder    Seizures (HCC)    SSS (sick sinus syndrome) (HCC)    PPM placed   Past Surgical History:  Procedure Laterality Date   PACEMAKER IMPLANT      Current Outpatient Medications:    acetaminophen  (TYLENOL ) 325 MG tablet, Take 650 mg by mouth every 6 (six) hours as needed for mild pain (pain score 1-3)., Disp: , Rfl:    clonazePAM  (KLONOPIN ) 0.5 MG disintegrating tablet, Take 0.5 mg by mouth 3 (three) times daily., Disp: , Rfl:    cloNIDine  (CATAPRES  - DOSED IN MG/24 HR) 0.2 mg/24hr patch, Place 0.2 mg onto the skin once a week. Thursday, Disp: , Rfl:    cyanocobalamin (VITAMIN B12) 1000 MCG tablet, Take 1,000 mcg by mouth daily., Disp: , Rfl:    dextromethorphan-guaiFENesin  (MUCINEX  DM) 30-600 MG 12hr tablet, Take 1 tablet by mouth 2 (two) times daily as needed for cough., Disp: 30 tablet, Rfl: 0   ferrous sulfate  325 (65 FE) MG tablet, Take 325 mg by mouth daily., Disp: , Rfl:    guaiFENesin  (MUCINEX ) 600 MG 12 hr tablet, Take 1 tablet by mouth 2 (two) times daily as needed for cough., Disp: , Rfl:    lamoTRIgine  (LAMICTAL ) 100 MG tablet, Take 100 mg by mouth 2 (two) times daily., Disp: , Rfl:    lamoTRIgine  (LAMICTAL ) 25 MG tablet, Take 50-75 mg by mouth See admin instructions. Take in addition to 100 mg tablet for a total morning dose of 150mg  and bedtime dose of 175mg , Disp: , Rfl:    loratadine  (CLARITIN ) 10 MG tablet, Take 10 mg by mouth daily. (Patient not taking: Reported on 11/22/2023),  Disp: , Rfl:    Melatonin 3 MG TABS, Take 3 mg by mouth every evening., Disp: , Rfl:    Multiple Vitamins-Minerals (PRESERVISION AREDS 2) CAPS, Take 1 capsule by mouth 2 (two) times daily., Disp: , Rfl:    Omega-3 Fatty Acids (FISH OIL) 1000 MG CAPS, Take 2,000 mg by mouth daily., Disp: , Rfl:    polyethylene glycol (MIRALAX  / GLYCOLAX ) 17 g packet, Take 17 g by mouth in the morning, at noon, and at bedtime., Disp: , Rfl:    QUEtiapine  (SEROQUEL ) 100 MG tablet, Take 100-150 mg by mouth See admin instructions. Take 1 tablet by mouth twice a day then 1.5 tablets at bedtime, Disp: , Rfl:    rosuvastatin  (CRESTOR ) 20 MG tablet, Take 1 tablet (20 mg total) by mouth daily., Disp: 90 tablet, Rfl: 0   senna (SENOKOT) 8.6 MG TABS tablet, Take 2 tablets by mouth at bedtime., Disp: , Rfl:   Allergies  Allergen Reactions   Dilantin [Phenytoin]     Unknown reaction   Keppra [Levetiracetam]     Unknown reaction    Lisinopril     Unknown reaction    Review of Systems Objective:  There were no vitals filed for this visit.  General: Well developed, nourished, in no acute distress, alert  and oriented x3   Dermatological: Skin is warm, dry and supple bilateral. Nails x 10 are thick and dystrophic elongated and painful remaining integument appears unremarkable at this time. There are no open sores, no preulcerative lesions, no rash or signs of infection present.  Benign neoplasm plantar aspect first metatarsal right foot with fat pad atrophy  Vascular: Dorsalis Pedis artery and Posterior Tibial artery pedal pulses are 2/4 bilateral with immedate capillary fill time. Pedal hair growth present. No varicosities and no lower extremity edema present bilateral.   Neruologic: Grossly intact via light touch bilateral. Vibratory intact via tuning fork bilateral. Protective threshold with Semmes Wienstein monofilament intact to all pedal sites bilateral. Patellar and Achilles deep tendon reflexes 2+ bilateral. No  Babinski or clonus noted bilateral.   Musculoskeletal: No gross boney pedal deformities bilateral. No pain, crepitus, or limitation noted with foot and ankle range of motion bilateral. Muscular strength 5/5 in all groups tested bilateral.  Pes planovalgus.  Hallux limitus bilateral most likely resulting in some of his painful callus formation  Gait: Unassisted, Nonantalgic.    Radiographs:  None taken  Assessment & Plan:   Assessment: Pain secondary to thick possibly mycotic nails.  Pain secondary to painful lesion benign neoplasm skin first metatarsal right.  Plan: Debridement of toenails and debridement of painful lesion.  He will follow-up with Dr. Gaynel.     Vedanth Sirico T. [place], [place]

## 2023-12-21 ENCOUNTER — Emergency Department

## 2023-12-21 ENCOUNTER — Other Ambulatory Visit: Payer: Self-pay

## 2023-12-21 ENCOUNTER — Emergency Department
Admission: EM | Admit: 2023-12-21 | Discharge: 2023-12-21 | Disposition: A | Discharge status: Home / Self Care | Attending: Emergency Medicine | Admitting: Emergency Medicine

## 2023-12-21 DIAGNOSIS — R944 Abnormal results of kidney function studies: Secondary | ICD-10-CM | POA: Diagnosis not present

## 2023-12-21 DIAGNOSIS — S3011XA Contusion of abdominal wall, initial encounter: Secondary | ICD-10-CM | POA: Diagnosis not present

## 2023-12-21 DIAGNOSIS — S065X0A Traumatic subdural hemorrhage without loss of consciousness, initial encounter: Secondary | ICD-10-CM | POA: Insufficient documentation

## 2023-12-21 DIAGNOSIS — Y92129 Unspecified place in nursing home as the place of occurrence of the external cause: Secondary | ICD-10-CM | POA: Insufficient documentation

## 2023-12-21 DIAGNOSIS — D649 Anemia, unspecified: Secondary | ICD-10-CM | POA: Insufficient documentation

## 2023-12-21 DIAGNOSIS — W1839XA Other fall on same level, initial encounter: Secondary | ICD-10-CM | POA: Diagnosis not present

## 2023-12-21 DIAGNOSIS — I700 Atherosclerosis of aorta: Secondary | ICD-10-CM | POA: Insufficient documentation

## 2023-12-21 DIAGNOSIS — W19XXXA Unspecified fall, initial encounter: Secondary | ICD-10-CM | POA: Diagnosis not present

## 2023-12-21 DIAGNOSIS — N281 Cyst of kidney, acquired: Secondary | ICD-10-CM | POA: Diagnosis not present

## 2023-12-21 DIAGNOSIS — S0990XA Unspecified injury of head, initial encounter: Secondary | ICD-10-CM | POA: Diagnosis present

## 2023-12-21 DIAGNOSIS — S065XAA Traumatic subdural hemorrhage with loss of consciousness status unknown, initial encounter: Secondary | ICD-10-CM

## 2023-12-21 LAB — CBC WITH DIFFERENTIAL/PLATELET
Abs Immature Granulocytes: 0 K/uL (ref 0.00–0.07)
Basophils Absolute: 0 K/uL (ref 0.0–0.1)
Basophils Relative: 1 %
Eosinophils Absolute: 0.1 K/uL (ref 0.0–0.5)
Eosinophils Relative: 1 %
HCT: 30.2 % — ABNORMAL LOW (ref 39.0–52.0)
Hemoglobin: 9.7 g/dL — ABNORMAL LOW (ref 13.0–17.0)
Immature Granulocytes: 0 %
Lymphocytes Relative: 13 %
Lymphs Abs: 0.6 K/uL — ABNORMAL LOW (ref 0.7–4.0)
MCH: 30.9 pg (ref 26.0–34.0)
MCHC: 32.1 g/dL (ref 30.0–36.0)
MCV: 96.2 fL (ref 80.0–100.0)
Monocytes Absolute: 0.3 K/uL (ref 0.1–1.0)
Monocytes Relative: 7 %
Neutro Abs: 3.7 K/uL (ref 1.7–7.7)
Neutrophils Relative %: 78 %
Platelets: 141 K/uL — ABNORMAL LOW (ref 150–400)
RBC: 3.14 MIL/uL — ABNORMAL LOW (ref 4.22–5.81)
RDW: 15.9 % — ABNORMAL HIGH (ref 11.5–15.5)
WBC: 4.7 K/uL (ref 4.0–10.5)
nRBC: 0 % (ref 0.0–0.2)

## 2023-12-21 LAB — COMPREHENSIVE METABOLIC PANEL WITH GFR
ALT: 26 U/L (ref 0–44)
AST: 40 U/L (ref 15–41)
Albumin: 3.9 g/dL (ref 3.5–5.0)
Alkaline Phosphatase: 118 U/L (ref 38–126)
Anion gap: 9 (ref 5–15)
BUN: 33 mg/dL — ABNORMAL HIGH (ref 8–23)
CO2: 25 mmol/L (ref 22–32)
Calcium: 9 mg/dL (ref 8.9–10.3)
Chloride: 107 mmol/L (ref 98–111)
Creatinine, Ser: 1.71 mg/dL — ABNORMAL HIGH (ref 0.61–1.24)
GFR, Estimated: 45 mL/min — ABNORMAL LOW (ref >60)
Glucose, Bld: 98 mg/dL (ref 70–99)
Potassium: 4.5 mmol/L (ref 3.5–5.1)
Sodium: 141 mmol/L (ref 135–145)
Total Bilirubin: 0.3 mg/dL (ref 0.0–1.2)
Total Protein: 6.8 g/dL (ref 6.5–8.1)

## 2023-12-21 LAB — PROTIME-INR
INR: 1 (ref 0.8–1.2)
Prothrombin Time: 14 s (ref 11.4–15.2)

## 2023-12-21 LAB — APTT: aPTT: 32 s (ref 24–36)

## 2023-12-21 MED ORDER — SODIUM CHLORIDE 0.9 % IV BOLUS
500.0000 mL | Freq: Once | INTRAVENOUS | Status: AC
  Administered 2023-12-21: 500 mL via INTRAVENOUS

## 2023-12-21 MED ORDER — IOHEXOL 300 MG/ML  SOLN
80.0000 mL | Freq: Once | INTRAMUSCULAR | Status: AC | PRN
  Administered 2023-12-21: 80 mL via INTRAVENOUS

## 2023-12-21 NOTE — ED Provider Notes (Addendum)
 Patient received in signout from Dr. Hobert pending repeat CT head.  Patient fell and has a small 3 mm subdural, repeat CT without any expansion.  CT chest, abdomen and pelvis also obtained due to flank discomfort after a fall, no evidence of traumatic injuries, rib fractures.  Educate patient and his caregiver of renal cyst, they report having an upcoming visit at [facility] next month related to this.  Discussed following up with neurosurgery, ED return precautions.  Patient suitable for outpatient management.   Claudene Rover, MD 12/21/23 1840  .Critical Care  Performed by: Claudene Rover, MD Authorized by: Claudene Rover, MD   Critical care provider statement:    Critical care time (minutes):  30   Critical care was necessary to treat or prevent imminent or life-threatening deterioration of the following conditions:  CNS failure or compromise   Critical care was time spent personally by me on the following activities:  Development of treatment plan with patient or surrogate, discussions with consultants, evaluation of patient's response to treatment, examination of patient, ordering and review of laboratory studies, ordering and review of radiographic studies, ordering and performing treatments and interventions, pulse oximetry, re-evaluation of patient's condition and review of old charts     Claudene Rover, MD 12/21/23 [phone]

## 2023-12-21 NOTE — ED Notes (Signed)
 Received call from Charge RN pt has abnormal scan per MD Ernest. Pt acuity changed and pt taken to ED 11. RN Lang and EDT mykia placed pt on monitor. Caregiver at bedside.

## 2023-12-21 NOTE — Consult Note (Signed)
 Consulting Department:  Emergency department  Primary Physician:  Lizette Lango, MD  Chief Complaint: Fall with tentorial subdural hematoma  History of Present Illness: 12/21/2023 Frank Conley is a 63 y.o. male who presents with the chief complaint of tentorial subdural hematoma.  Mild headache.  Struck his head after a fall.  No obvious loss of consciousness.  No seizures, no CSF leak, no new neck pain.  No new numbness weakness or tingling.  Review of Systems:  A 10 point review of systems is negative, except for the pertinent positives and negatives detailed in the HPI.  Past Medical History: Past Medical History:  Diagnosis Date   Depression    Hypertension    MGUS (monoclonal gammopathy of unknown significance)    Pervasive developmental disorder    Seizures (HCC)    SSS (sick sinus syndrome) (HCC)    PPM placed    Past Surgical History: Past Surgical History:  Procedure Laterality Date   PACEMAKER IMPLANT      Allergies: Allergies as of 12/21/2023 - Review Complete 12/21/2023  Allergen Reaction Noted   Dilantin [phenytoin]  08/29/2014   Keppra [levetiracetam]  08/29/2014   Lisinopril  08/29/2014    Medications: No current facility-administered medications for this encounter.  Current Outpatient Medications:    acetaminophen  (TYLENOL ) 325 MG tablet, Take 650 mg by mouth every 6 (six) hours as needed for mild pain (pain score 1-3)., Disp: , Rfl:    clonazePAM  (KLONOPIN ) 0.5 MG disintegrating tablet, Take 0.5 mg by mouth 3 (three) times daily., Disp: , Rfl:    cloNIDine  (CATAPRES  - DOSED IN MG/24 HR) 0.2 mg/24hr patch, Place 0.2 mg onto the skin once a week. Thursday, Disp: , Rfl:    cyanocobalamin  (VITAMIN B12) 1000 MCG tablet, Take 1,000 mcg by mouth daily., Disp: , Rfl:    dextromethorphan-guaiFENesin  (MUCINEX  DM) 30-600 MG 12hr tablet, Take 1 tablet by mouth 2 (two) times daily as needed for cough., Disp: 30 tablet, Rfl: 0   ferrous sulfate  325 (65 FE) MG  tablet, Take 325 mg by mouth daily., Disp: , Rfl:    guaiFENesin  (MUCINEX ) 600 MG 12 hr tablet, Take 1 tablet by mouth 2 (two) times daily as needed for cough., Disp: , Rfl:    lamoTRIgine  (LAMICTAL ) 100 MG tablet, Take 100 mg by mouth 2 (two) times daily., Disp: , Rfl:    lamoTRIgine  (LAMICTAL ) 25 MG tablet, Take 50-75 mg by mouth See admin instructions. Take in addition to 100 mg tablet for a total morning dose of 150mg  and bedtime dose of 175mg , Disp: , Rfl:    loratadine  (CLARITIN ) 10 MG tablet, Take 10 mg by mouth daily. (Patient not taking: Reported on 11/22/2023), Disp: , Rfl:    Melatonin 3 MG TABS, Take 3 mg by mouth every evening., Disp: , Rfl:    Multiple Vitamins-Minerals (PRESERVISION AREDS 2) CAPS, Take 1 capsule by mouth 2 (two) times daily., Disp: , Rfl:    Omega-3 Fatty Acids (FISH OIL) 1000 MG CAPS, Take 2,000 mg by mouth daily., Disp: , Rfl:    polyethylene glycol (MIRALAX  / GLYCOLAX ) 17 g packet, Take 17 g by mouth in the morning, at noon, and at bedtime., Disp: , Rfl:    QUEtiapine  (SEROQUEL ) 100 MG tablet, Take 100-150 mg by mouth See admin instructions. Take 1 tablet by mouth twice a day then 1.5 tablets at bedtime, Disp: , Rfl:    rosuvastatin  (CRESTOR ) 20 MG tablet, Take 1 tablet (20 mg total) by mouth daily., Disp:  90 tablet, Rfl: 0   senna (SENOKOT) 8.6 MG TABS tablet, Take 2 tablets by mouth at bedtime., Disp: , Rfl:    Social History: Social History   Tobacco Use   Smoking status: Never   Smokeless tobacco: Never  Vaping Use   Vaping status: Never Used  Substance Use Topics   Alcohol use: Never   Drug use: Never    Family Medical History: Family History  Family history unknown: Yes    Physical Examination: Vitals:   12/21/23 1148  Pulse: 64  Resp: 18  Temp: 98 F (36.7 C)     General: Patient is well developed, well nourished, calm, collected, and in no apparent distress.  NEUROLOGICAL:  General: In no acute distress.   Awake, alert, oriented  to person, place, and time.  Pupils equal round and reactive to light.  Full Facial tone is symmetric.  Tongue protrusion is midline.  Bilateral upper extremities are full strength proximally and distally.  There is no pronator drift.  Language is conversant.  GCS: 15  Imaging: DG Ribs Unilateral W/Chest Left Result Date: 12/21/2023 CLINICAL DATA:  Fall. Deformity to the left rib areae with bruising. EXAM: LEFT RIBS AND CHEST - 3+ VIEW COMPARISON:  Chest x-ray 11/29/2023 FINDINGS: Low lung volumes. There is left base atelectasis or infiltrate. Minimal atelectasis noted right base. No evidence for pneumothorax or substantial pleural effusion. Left-sided dual lead permanent pacemaker evident. Oblique views of the left ribs show multiple deformity of the lateral left third, fourth, and fifth ribs compatible with healed fractures, stable since chest x-ray 11/29/2023. No definite acute displaced left-sided rib fracture on the current study. IMPRESSION: 1. Low lung volumes with left base atelectasis or infiltrate. 2. Old left-sided rib fractures stable since chest x-ray of 11/29/2023.No definite acute displaced left-sided rib fracture. Electronically Signed   By: Camellia Candle M.D.   On: 12/21/2023 11:45   CT Cervical Spine Wo Contrast Result Date: 12/21/2023 EXAM: CT CERVICAL SPINE WITHOUT CONTRAST 12/21/2023 11:09:15 AM TECHNIQUE: CT of the cervical spine was performed without the administration of intravenous contrast. Multiplanar reformatted images are provided for review. Automated exposure control, iterative reconstruction, and/or weight based adjustment of the mA/kV was utilized to reduce the radiation dose to as low as reasonably achievable. COMPARISON: None available. CLINICAL HISTORY: fall FINDINGS: CERVICAL SPINE: BONES AND ALIGNMENT: No acute fracture or traumatic malalignment. Diffuse idiopathic skeletal hyperostosis. DEGENERATIVE CHANGES: No significant degenerative changes. SOFT TISSUES: No  prevertebral soft tissue swelling. Atherosclerotic calcifications of the carotid bulbs. IMPRESSION: 1. No acute fracture or traumatic malalignment of the cervical spine. Electronically signed by: Ryan Chess MD 12/21/2023 11:26 AM EST RP Workstation: [id number]   CT Head Wo Contrast Result Date: 12/21/2023 EXAM: CT HEAD WITHOUT CONTRAST 12/21/2023 11:09:15 AM TECHNIQUE: CT of the head was performed without the administration of intravenous contrast. Automated exposure control, iterative reconstruction, and/or weight based adjustment of the mA/kV was utilized to reduce the radiation dose to as low as reasonably achievable. COMPARISON: 11/29/2023 CLINICAL HISTORY: fall FINDINGS: BRAIN AND VENTRICLES: Thin acute subdural hemorrhage along the anterior falx, measuring up to 3 mm in thickness. Moderate calcific atheromatous disease. No evidence of acute infarct. No hydrocephalus. No mass effect or midline shift. ORBITS: Chronic deformity of the left lamina papyracea. SINUSES: No acute abnormality. SOFT TISSUES AND SKULL: Right parietal scalp contusion. No skull fracture. IMPRESSION: 1. Thin acute subdural hemorrhage along the anterior falx, up to 3 mm in thickness. 2. Right parietal scalp contusion. 3. TBI risk  stratification: low. Critical findings were communicated by phone at the time of interpretation to Dr. Ernest. Electronically signed by: Ryan Chess MD 12/21/2023 11:24 AM EST RP Workstation: [id number]     I have personally reviewed the images and agree with the above interpretation.  Labs:    Latest Ref Rng & Units 12/21/2023   11:58 AM 11/29/2023   11:00 AM 11/23/2023   11:08 AM  CBC  WBC 4.0 - 10.5 K/uL 4.7  5.1  6.1   Hemoglobin 13.0 - 17.0 g/dL 9.7  89.8  9.6   Hematocrit 39.0 - 52.0 % 30.2  31.9  29.9   Platelets 150 - 400 K/uL 141  188  149       Latest Ref Rng & Units 11/29/2023   11:00 AM 11/23/2023   11:08 AM 11/22/2023    9:29 AM  BMP  Glucose 70 - 99 mg/dL 82  822  89    BUN 8 - 23 mg/dL 25  31  40   Creatinine 0.61 - 1.24 mg/dL 8.41  8.46  8.42   Sodium 135 - 145 mmol/L 139  137  138   Potassium 3.5 - 5.1 mmol/L 4.2  3.8  3.9   Chloride 98 - 111 mmol/L 103  106  104   CO2 22 - 32 mmol/L 25  24  24    Calcium  8.9 - 10.3 mg/dL 9.0  8.9  9.1         Assessment and Plan: Mr. Gerhold is a pleasant 63 y.o. male with fall and head trauma.  No obvious loss of consciousness reported.  Patient has mild headache but no new neurologic complaints.  No seizures.  No weakness numbness or tingling.  No neck pain.  On physical examination no localizing symptomatology.  Pupils equal round reactive to light patient aware GCS 15. With a small para falcine SDH, currently non operative. Will plan on repeat head CT and follow up in 4 weeks if stable. No intervention needed at this time.   Penne MICAEL Sharps, MD/MSCR Dept. of Neurosurgery

## 2023-12-21 NOTE — ED Provider Notes (Addendum)
 [facility] Provider Note    Event Date/Time   First MD Initiated Contact with Patient 12/21/23 1147     (approximate)   History   Fall   HPI  Frank Conley is a 63 y.o. male who comes in from group home with concerns for a fall.  Patient was walking to story time when his lap belt had fallen down to his knees and he bent over to try to lifted up back on his waist when he had mechanical fall and fell backwards in the back of his head.  He denies any chest pain, shortness of breath, abdominal pain, extremity pain or any other concerns.  Physical Exam   Triage Vital Signs: ED Triage Vitals  Encounter Vitals Group     BP --      Girls Systolic BP Percentile --      Girls Diastolic BP Percentile --      Boys Systolic BP Percentile --      Boys Diastolic BP Percentile --      Pulse Rate 12/21/23 1148 64     Resp 12/21/23 1148 18     Temp 12/21/23 1148 98 F (36.7 C)     Temp src --      SpO2 --      Weight --      Height --      Head Circumference --      Peak Flow --      Pain Score 12/21/23 1054 6     Pain Loc --      Pain Education --      Exclude from Growth Chart --     Most recent vital signs: Vitals:   12/21/23 1148  Pulse: 64  Resp: 18  Temp: 98 F (36.7 C)     General: Awake, no distress.  CV:  Good peripheral perfusion.  Left chest wall tenderness Resp:  Normal effort.  Abd:  No distention.  Some tenderness on the left upper abdomen with some bruising noted Other:  Moving all extremities well Hematoma noted   ED Results / Procedures / Treatments   Labs (all labs ordered are listed, but only abnormal results are displayed) Labs Reviewed  CBC WITH DIFFERENTIAL/PLATELET - Abnormal; Notable for the following components:      Result Value   RBC 3.14 (*)    Hemoglobin 9.7 (*)    HCT 30.2 (*)    RDW 15.9 (*)    Platelets 141 (*)    Lymphs Abs 0.6 (*)    All other components within normal limits  COMPREHENSIVE  METABOLIC PANEL WITH GFR - Abnormal; Notable for the following components:   BUN 33 (*)    Creatinine, Ser 1.71 (*)    GFR, Estimated 45 (*)    All other components within normal limits  PROTIME-INR  APTT     RADIOLOGY I have reviewed the ct personally and interpreted no evidence of intercranial hemorrhage   PROCEDURES:  Critical Care performed: No  .1-3 Lead EKG Interpretation  Performed by: Ernest Ronal BRAVO, MD Authorized by: Ernest Ronal BRAVO, MD     Interpretation: normal     ECG rate:  60   ECG rate assessment: normal     Rhythm: sinus rhythm     Ectopy: none     Conduction: normal   Comments:     Lots of artifacts but I think its sinus     MEDICATIONS ORDERED IN ED: Medications -  No data to display   IMPRESSION / MDM / ASSESSMENT AND PLAN / ED COURSE  I reviewed the triage vital signs and the nursing notes.   Patient's presentation is most consistent with acute presentation with potential threat to life or bodily function.   Patient comes in for mechanical fall.  CT imaging ordered from triage to evaluate for intracranial hemorrhage, cervical fracture, rib fracture.  X-ray without any evidence of obvious rib fracture. CT cervical negative CT head:  IMPRESSION: 1. Thin acute subdural hemorrhage along the anterior falx, up to 3 mm in thickness. 2. Right parietal scalp contusion. 3. TBI risk stratification: low.  EKG has a lot of artifact but does appear regular and does appear to be sinus in nature with a rate of 90-100. On monitor it also has artifact but suspect sinus- nurse is getting a repeat EKG to confirm   Discussed with Dr. Claudene who recommends repeat CT head in 6 hours.  When I went to evaluate patient he does have some left-sided chest wall tenderness and some left sided bruising that is very minimal in nature discussed with the legal guardian who states that he is a hard historian to tell if he is in any pain.  Given he is not the best historian I  will add on CT chest abdomen pelvis to ensure no other injuries but this time he is hemodynamically stable and I think it is reasonable to wait for CTs to be done with the CT head at 5:00.  If I give the contrast now that it would affect the CT head results and my suspicion is overall low.  I be more concerned about some rib fractures.    Coags are normal CBC shows hemoglobin of 9.7 down of around where patient has been over the past few weeks.  CMP shows creatinine of 1.7.  Patient's baseline is around 1.5-1.6  Legal guardian was updated on this plan.  Patient will be handed off to oncoming team pending CT imaging , EKG, and further disposition  The patient is on the cardiac monitor to evaluate for evidence of arrhythmia and/or significant heart rate changes.  Clinical Course as of 12/21/23 1201  Fri Dec 21, 2023  1146 CT Cervical Spine Wo Contrast 1. No acute fracture or traumatic malalignment of the cervical spine.  [TT]  1146 CT Head Wo Contrast IMPRESSION: 1. Thin acute subdural hemorrhage along the anterior falx, up to 3 mm in thickness. 2. Right parietal scalp contusion. 3. TBI risk stratification: low.   [TT]    Clinical Course User Index [TT] Waymond Lorelle Cummins, MD     FINAL CLINICAL IMPRESSION(S) / ED DIAGNOSES   Final diagnoses:  Fall, initial encounter  Subdural hematoma (HCC)     Rx / DC Orders   ED Discharge Orders     None        Note:  This document was prepared using Dragon voice recognition software and may include unintentional dictation errors.   Ernest Ronal BRAVO, MD 12/21/23 1529    Ernest Ronal BRAVO, MD 12/21/23 1543    Ernest Ronal BRAVO, MD 12/21/23 [phone]

## 2023-12-21 NOTE — Discharge Instructions (Addendum)
 Small brain bleed (subdural hematoma) up to 3 mm in thickness.  CT results are listed below. He has a cyst on left kidney , constipation. Follow up with your PCP for these.   IMPRESSION:  1. No CT evidence for acute intrathoracic, intra-abdominal, or  intrapelvic abnormality.  2. Left lower lobe subpleural consolidation slightly diminished  compared to the CT earlier this month. This could represent  decreased atelectasis or resolving pneumonia.  3. 9.2 cm complex appearing left renal cyst with possible thickened  septa. As previously stated, renal protocol MRI or CT recommended  for further assessment on a nonemergent basis  4. Large stool burden suggesting constipation.  5. Aortic atherosclerosis.

## 2023-12-21 NOTE — ED Triage Notes (Signed)
 Pt comes via EMS from [facility] with c/o fall. Pt states no loc and did hit his head. Pt was walking down ramp. Pt has hematoma to head. Pt not on thinners.pt states neck pain also. Pt has deformity to left rib area and with bruising. Unknown if happened today.

## 2023-12-24 ENCOUNTER — Other Ambulatory Visit: Payer: Self-pay

## 2023-12-24 ENCOUNTER — Telehealth: Payer: Self-pay

## 2023-12-24 DIAGNOSIS — S065XAA Traumatic subdural hemorrhage with loss of consciousness status unknown, initial encounter: Secondary | ICD-10-CM

## 2023-12-24 NOTE — Addendum Note (Signed)
 Addended by: BENN AUSTON HERO on: 12/24/2023 10:46 AM   Modules accepted: Orders

## 2023-12-24 NOTE — Telephone Encounter (Signed)
 Called caregiver Rock (sister-in-law), to find out where they would like to go for CT scan. Mrs. Rock stated she was unaware of a follow up with our office and he needing a follow CT scan. She said she will discuss this with her husband (pt's brother) and give us  a call back.

## 2023-12-25 NOTE — Telephone Encounter (Signed)
 Spoke with Rock she states that they are all set up. CT scan is 01/07/24 and they will come to see Lyle Decamp 01/16/24

## 2024-01-07 ENCOUNTER — Ambulatory Visit

## 2024-01-07 DIAGNOSIS — S065XAA Traumatic subdural hemorrhage with loss of consciousness status unknown, initial encounter: Secondary | ICD-10-CM

## 2024-01-10 NOTE — Progress Notes (Signed)
 Concern for viral illness including COVID/influenza

## 2024-01-11 ENCOUNTER — Ambulatory Visit
Admission: RE | Admit: 2024-01-11 | Discharge: 2024-01-11 | Attending: Physician Assistant | Admitting: Physician Assistant

## 2024-01-11 DIAGNOSIS — S065XAA Traumatic subdural hemorrhage with loss of consciousness status unknown, initial encounter: Secondary | ICD-10-CM

## 2024-01-11 NOTE — Progress Notes (Unsigned)
 Referring Physician:  Lizette Lango, MD [id number] [facility] [address] [place],  [place] [address]  Primary Physician:  Lizette Lango, MD  History of Present Illness: 01/16/2024 [name] is a 63 year old male with past medical history significant for developmental disorder, OCD, depression, seizures, TIA, sick sinus syndrome with history of pacemaker, vertebral artery stenosis, chronic gait instability, pancytopenia, HFpEF, CKD, MGUS, complex renal cyst.  He comes today for hospital follow-up after a fall on 12/21/2023 and found to have a tentorial subdural hematoma.  He is accompanied by his brother and sister-in-law.  He states that at times he has some intermittent headache, but no new neurologic complaints.  Family has advised they have not noticed anything new in their interactions with him.  They continue to be concerned regarding his gait instability.  They would like him to start using his rolling walker again to help prevent falls.    Review of Systems:  A 10 point review of systems is negative, except for the pertinent positives and negatives detailed in the HPI.  Past Medical History: Past Medical History:  Diagnosis Date   Depression    Hypertension    MGUS (monoclonal gammopathy of unknown significance)    Pervasive developmental disorder    Seizures (HCC)    SSS (sick sinus syndrome) (HCC)    PPM placed    Past Surgical History: Past Surgical History:  Procedure Laterality Date   PACEMAKER IMPLANT      Allergies: Allergies as of 01/16/2024 - Review Complete 12/21/2023  Allergen Reaction Noted   Dilantin [phenytoin]  08/29/2014   Keppra [levetiracetam]  08/29/2014   Lisinopril  08/29/2014    Medications: Outpatient Encounter Medications as of 01/16/2024  Medication Sig   acetaminophen  (TYLENOL ) 325 MG tablet Take 650 mg by mouth every 6 (six) hours as needed for mild pain (pain score 1-3).   clonazePAM  (KLONOPIN ) 0.5 MG disintegrating  tablet Take 0.5 mg by mouth 3 (three) times daily.   cloNIDine  (CATAPRES  - DOSED IN MG/24 HR) 0.2 mg/24hr patch Place 0.2 mg onto the skin once a week. Thursday   cyanocobalamin  (VITAMIN B12) 1000 MCG tablet Take 1,000 mcg by mouth daily.   dextromethorphan-guaiFENesin  (MUCINEX  DM) 30-600 MG 12hr tablet Take 1 tablet by mouth 2 (two) times daily as needed for cough.   ferrous sulfate  325 (65 FE) MG tablet Take 325 mg by mouth daily.   guaiFENesin  (MUCINEX ) 600 MG 12 hr tablet Take 1 tablet by mouth 2 (two) times daily as needed for cough.   lamoTRIgine  (LAMICTAL ) 100 MG tablet Take 100 mg by mouth 2 (two) times daily.   lamoTRIgine  (LAMICTAL ) 25 MG tablet Take 50-75 mg by mouth See admin instructions. Take in addition to 100 mg tablet for a total morning dose of 150mg  and bedtime dose of 175mg    loratadine  (CLARITIN ) 10 MG tablet Take 10 mg by mouth daily. (Patient not taking: Reported on 11/22/2023)   Melatonin 3 MG TABS Take 3 mg by mouth every evening.   Multiple Vitamins-Minerals (PRESERVISION AREDS 2) CAPS Take 1 capsule by mouth 2 (two) times daily.   Omega-3 Fatty Acids (FISH OIL) 1000 MG CAPS Take 2,000 mg by mouth daily.   polyethylene glycol (MIRALAX  / GLYCOLAX ) 17 g packet Take 17 g by mouth in the morning, at noon, and at bedtime.   QUEtiapine  (SEROQUEL ) 100 MG tablet Take 100-150 mg by mouth See admin instructions. Take 1 tablet by mouth twice a day then 1.5 tablets  at bedtime   rosuvastatin  (CRESTOR ) 20 MG tablet Take 1 tablet (20 mg total) by mouth daily.   senna (SENOKOT) 8.6 MG TABS tablet Take 2 tablets by mouth at bedtime.   No facility-administered encounter medications on file as of 01/16/2024.    Social History: Social History   Tobacco Use   Smoking status: Never   Smokeless tobacco: Never  Vaping Use   Vaping status: Never Used  Substance Use Topics   Alcohol use: Never   Drug use: Never    Family Medical History: Family History  Family history unknown: Yes     Physical Examination: @VITALWITHPAIN @  General: Patient is well developed, well nourished, calm, collected, and in no apparent distress. Attention to examination is appropriate.  Psychiatric: Patient is non-anxious.  Head:  Pupils equal, round, and reactive to light.  ENT:  Oral mucosa appears well hydrated.  Neck:   Supple.  Full range of motion.  Respiratory: Patient is breathing without any difficulty.  Extremities: No edema.  Vascular: Palpable dorsal pedal pulses.  Skin:   On exposed skin, there are no abnormal skin lesions.  NEUROLOGICAL:     Awake, alert, oriented to person, place, and time.  Speech is clear and fluent. Fund of knowledge is appropriate.   Cranial Nerves: Pupils equal round and reactive to light.  Facial tone is symmetric.  Tongue protrusion is midline. Bilateral upper extremities are full strength proximally distally.  There is no pronator drift.  Language is to baseline and conversant.   Medical Decision Making  Imaging:  CT HEAD WITHOUT CONTRAST 01/11/2024 12:51:09 PM   TECHNIQUE: CT of the head was performed without the administration of intravenous contrast. Automated exposure control, iterative reconstruction, and/or weight based adjustment of the mA/kV was utilized to reduce the radiation dose to as low as reasonably achievable.   COMPARISON: 12/21/2023   CLINICAL HISTORY: Subdural hemorrhage   FINDINGS:   BRAIN AND VENTRICLES: The previously seen small right parafalcine subdural hematoma has resolved. No new hemorrhage or acute infarction. No hydrocephalus. No extra-axial collection. No mass effect or midline shift.   ORBITS: No acute abnormality.   SINUSES: No acute abnormality.   SOFT TISSUES AND SKULL: No acute soft tissue abnormality. No skull fracture.   IMPRESSION: 1. No acute intracranial abnormality. 2. Previously seen small right parafalcine subdural hematoma has resolved.      EXAM: CT ANGIOGRAPHY  HEAD AND NECK WITH AND WITHOUT CONTRAST   TECHNIQUE: Multidetector CT imaging of the head and neck was performed using the standard protocol during bolus administration of intravenous contrast. Multiplanar CT image reconstructions and MIPs were obtained to evaluate the vascular anatomy. Carotid stenosis measurements (when applicable) are obtained utilizing NASCET criteria, using the distal internal carotid diameter as the denominator.   RADIATION DOSE REDUCTION: This exam was performed according to the departmental dose-optimization program which includes automated exposure control, adjustment of the mA and/or kV according to patient size and/or use of iterative reconstruction technique.   CONTRAST:  75mL OMNIPAQUE  IOHEXOL  350 MG/ML SOLN   COMPARISON:  CTA head and neck 12/16/2022   FINDINGS: CTA NECK FINDINGS   Aortic arch: Normal variant aortic arch branching pattern with common origin of the brachiocephalic and left common carotid arteries. Widely patent brachiocephalic and subclavian arteries.   Right carotid system: Patent with mixed calcified and soft plaque in the carotid bulb resulting in 65% stenosis of the proximal ICA, mildly progressed.   Left carotid system: Patent with bulky calcified plaque throughout the  proximal ICA resulting in high-grade stenosis of 75% or greater, similar to prior.   Vertebral arteries: Patent and codominant. As before, there is suspected moderate to severe stenosis of the right V1 segment with assessment limited by streak artifact from venous contrast. Mixed calcified and soft plaque in the left V1 segment results in up to severe stenosis distally, similar to prior.   Skeleton: Bulky anterior vertebral osteophytes throughout the cervical spine.   Other neck: No evidence of cervical lymphadenopathy or mass.   Upper chest: No mass or consolidation in the included lung apices.   Review of the MIP images confirms the above findings    CTA HEAD FINDINGS   Anterior circulation: The internal carotid arteries are patent from skull base to carotid termini with calcified plaque resulting in severe bilateral paraclinoid stenoses, similar to prior. ACAs and MCAs are patent with branch vessel irregularity but no evidence of a proximal branch occlusion or significant proximal stenosis. No aneurysm is identified.   Posterior circulation: The intracranial vertebral arteries are patent with right greater than left V4 segment atherosclerosis resulting in up to severe stenosis on the right and moderate stenosis on the left, similar to prior. Patent SCA origins are visualized bilaterally. The basilar artery is widely patent. Posterior communicating arteries are diminutive or absent. Both PCAs are patent without evidence of a significant proximal stenosis. No aneurysm is identified.   Venous sinuses: Patent.   Anatomic variants: None.   Review of the MIP images confirms the above findings   IMPRESSION: 1. Widespread atherosclerosis without a large vessel occlusion. 2. 65% stenosis of the proximal right ICA, mildly progressed. 3. Unchanged 75% or greater stenosis of the proximal left ICA. 4. Unchanged stenoses elsewhere including severe bilateral intracranial ICA stenoses and moderate to severe bilateral V1 and V4 stenoses.  I have personally reviewed the images and agree with the above interpretation.  Assessment and Plan: Mr. Banner is a pleasant 63 y.o. male with developmental disorder, frequent falls who suffered a subdural hematoma approximately 1 month ago.  On patient's most recent CT, patients  small right subdural hematoma has since resolved.  At this time from a neurosurgical standpoint there is no indication for surgery or repeat head CT given his stability.  Patient's family expressed some concerns due to his gait instability.  They would like him to reinstitute a rolling walker at his group home.  I am happy to  suggest this and I have written a new order.  Unfortunately, it is difficult to obtain a great neuroexam on this patient.  However, I did talk to the family regarding some concerns for cervical myelopathy as a possible cause of his frequent falls.  They were counseled on the workup for this including MRI of his cervical spine, but they would like to hold at this time to focus on moving forward with urologic care given recent findings on imaging showing large complex renal cyst concerning for cancer.  I understand and have told them that I am happy to move forward with this if/when they would like.  Questions were answered and concerns addressed.  Encouraged patient and his family to reach out to me for anything that they need in the future.  Red flag symptoms were reviewed in which they should seek further medical attention on any neurologic changes.  They expressed understanding.   Thank you for involving me in the care of this patient.    Lyle Decamp, PA-C Dept. of Neurosurgery

## 2024-01-15 ENCOUNTER — Emergency Department

## 2024-01-15 ENCOUNTER — Emergency Department: Admission: EM | Admit: 2024-01-15 | Discharge: 2024-01-15 | Disposition: A | Discharge status: Home / Self Care

## 2024-01-15 ENCOUNTER — Other Ambulatory Visit: Payer: Self-pay

## 2024-01-15 DIAGNOSIS — M25561 Pain in right knee: Secondary | ICD-10-CM

## 2024-01-15 DIAGNOSIS — T148XXA Other injury of unspecified body region, initial encounter: Secondary | ICD-10-CM

## 2024-01-15 NOTE — ED Triage Notes (Signed)
 Pt fell tonight after an argument and not wanting to let a teddy bear go. Pt c/o bilateral knee pain. Pt ambulatory.

## 2024-01-15 NOTE — Progress Notes (Signed)
 Referring Urologist: Elias Fox  Primary Care Provider: Fox Elias, MD  Assessment: Frank Conley is a 63 y.o. 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   We had a long and thoughtful discussion concerning treatment options including expectant management, surgery, and/or medical therapies including chemotherapy, targeted therapy, or immunotherapy. The patient's pathology and CT scan results were personally reviewed and discussed with him today. We discussed the gold standard treatment is generally surgery if an acceptable candidate.   The risks of surgery were discussed including risk of bleeding, the need for a blood transfusion, infection, injury to adjacent structures such as the rectum, ureters, bowel, spleen, pancreas, liver as well as anesthetic risk including, but not limited to heart attack, stroke, blood clots, and the rare case of death. We also discussed our typical hospital course and reasons for which he may require a longer stay. These include (but are not limited to) infection, blood clot, ileus, bowel obstruction, and any other complication associated with anesthesia and/or surgery. It was explained that the typical hospital stay is 3-5 days.  We also advised no heavy lifting, specifically no lifting greater than 10 pounds for the first 6 weeks after surgery. The patient was advised that he may have a JP drain in place after surgery.  If so, this drain is typically removed prior to discharge. he will have a foley catheter in place initially after surgery.   The patient has a significant cardiac history with pacemaker and 75% left carotid stenosis that is managed conservatively in the setting of his other comorbidities. His brother and his brother's wife (both his caregivers and guardians) report a significant functional decline in the past year and do not think he will tolerate a surgery. Given his comorbidities and functional  decline, he would be at high risk of complication from surgery and a less invasive approach is reasonable. We discussed active surveillance vs SBRT.   Discussed active surveillance regimen, risks, and benefits. Main risk of risk of progression to metastatic disease while on surveillance.   Discussed possibility of SBRT and our limited data for bozniak 4 cysts, but promising retrospective data for small renal masses. They are interested in learning more about SBRT.   Plan: 1. They are considering options and are interested in meeting with radiation oncology.  2. If he elects active surveillance, plan for repeat MRI in 6 months. 3. Preoperative evaluation: if he elects for surgery, we would need: cardiac clearance, vascular clearance, renal lasix scan, nephrology discussion, and anethesia evaluation 4. Follow-up with Dr. Fox for dyspnea  It was a pleasure seeing this patient in our clinic today.   Post-tumor board addendum: Reviewed imaging and discussed with radiation oncology. Appears amenable to SBRT and we will refer them to meet with radiation oncology to discuss risks and benefits.   Reason for Visit:  Kidney Mass.  HPI:  Frank Conley is a pleasant 63 y.o. male who is being seen today in consultation at the request of Yee Lam for a 9.1 cm renal mass.   Incidentally noted 9.1 cm renal mass on hospital admission for PNA  He has intellectual disability and lives in a group home. The history today was obtained from his brother and his brother's wife -- they report:  Increased difficulty walking, several fall in the past couple months. Decreased ambulation and stability; sedentary most of the day. Reports significant arterial disease -- carotids, vertebral -- managed conservatively given high  surgical risk per brother. Pacemaker and heart failure -- cardiologist at [facility]. CKD, stage 3 TIA about a year ago No hematuria or pain.   Surgical history: finger surgery as a child   sinus  node dysfunction, symptomatic bradycardia who had a dual chamber pacemaker  Has dyspnea with exertion recently after PNA hospitalization.   Fmaily history: mom lung cancer   PAST MEDICAL HISTORY: Past Medical History[1]  PAST SURGICAL HISTORY: Past Surgical History[2]  SOCIAL HISTORY: Social History [3]  FAMILY HISTORY: family history includes Alcohol abuse in his father and mother; Anxiety disorder in his mother; Arthritis in his brother and brother; Basal cell carcinoma in his brother; COPD in his mother; Cancer in his brother, brother, father, and mother; Dementia in his father, maternal grandmother, and paternal grandmother; Depression in his sister and sister; Diabetes in his mother; Emphysema in his mother; Glaucoma in his brother, brother, and mother; Heart disease in his father; Hyperlipidemia in his brother, brother, brother, and father; Hypertension in his brother, brother, father, mother, sister, and sister; Melanoma in his brother; Mental illness in his mother; No Known Problems in his maternal aunt, maternal uncle, paternal aunt, and paternal uncle; OCD in his mother; Parkinsonism in his maternal grandfather, maternal grandmother, and paternal grandfather; Squamous cell carcinoma in his brother; Vision loss in his mother.  MEDICATIONS:  Prior to Admission medications  Medication Sig Start Date End Date Taking? Authorizing Provider  aspirin  (ECOTRIN) 81 MG tablet TAKE 1 TABLET BY MOUTH ONCE DAILY SWALLOW WHOLE 11/20/23   Lam, Yee, MD  clonazePAM  (KLONOPIN ) 0.5 MG tablet Take 1 tablet (0.5 mg total) by mouth Three (3) times a day. 12/18/23   Sherrel Reynolds CROME, MD  clonazePAM  (KLONOPIN ) 0.5 MG tablet Take 1 tablet (0.5 mg total) by mouth Three (3) times a day. 01/15/24   Sherrel Reynolds CROME, MD  clonazePAM  (KLONOPIN ) 0.5 MG tablet Take 1 tablet (0.5 mg total) by mouth Three (3) times a day. 02/12/24   Sherrel Reynolds CROME, MD  cloNIDine  (CATAPRES -TTS) 0.2 mg/24 hr Place 1 patch on  the skin once a week. 05/24/23 05/23/24  Lam, Yee, MD  cyanocobalamin , vitamin B-12, 1000 MCG tablet TAKE 1 TABLET BY MOUTH ONCE DAILY 01/09/24   Lam, Yee, MD  FEROSUL 325 mg (65 mg iron) tablet TAKE 1 TABLET BY MOUTH EVERY OTHER DAY 11/20/23   Lam, Yee, MD  FISH OIL 300-1,000 mg capsule TAKE 2 CAPSULES BY MOUTH ONCE DAILY FOR SUPPLEMENT 01/09/24   Lam, Yee, MD  FISH OIL 340-1,000 mg capsule TAKE 2 CAPSULES BY MOUTH ONCE DAILY FOR SUPPLEMENT 06/28/20   Lam, Yee, MD  lamoTRIgine  (LAMICTAL ) 100 MG tablet Take 1 tablet (100 mg total) by mouth two (2) times a day. 12/03/23 05/31/24  Sherrel Reynolds CROME, MD  lamoTRIgine  (LAMICTAL ) 25 MG tablet TAKE 2 TABLETS BY MOUTH DAILY ALONG WITH100MG  TAB (TOTAL MORNING DOSE 150 MG) TAKE 3 TABLETS NIGHTLY ALONG WITH 100MG  TAB (TOTAL NIGHTLY DOSE 175 MG) 12/03/23   Bricken, Cheyenne L, MD  loratadine  (CLARITIN ) 10 mg tablet TAKE 1 TABLET BY MOUTH ONCE DAILY FOR ALLERGY SYMPTOMS 11/20/23   Lam, Yee, MD  melatonin 3 mg Tab TAKE 1 TABLET BY MOUTH IN THE EVENING AT5PM 06/08/23   Lam, Yee, MD  miscellaneous medical supply Misc 2 pairs compression hose to be worn as needed for leg swelling 03/18/14   Lizette Lango, MD  MUCINEX  600 mg 12 hr tablet TAKE 1 TABLET BY MOUTH 2 TIMES A DAY 12/26/22   Lam,  Yee, MD  omega 3-dha-epa-fish oil 300 mg (120 mg- 180mg )-1,000 mg cap TAKE 2 CAPSULES BY MOUTH ONCE DAILY FOR SUPPLEMENT 11/14/22   Lam, Yee, MD  polyethylene glycol (GLYCOLAX ) 17 gram/dose powder MIX 17 GRAMS IN 8 OZ OF WATER/JUICE AND DRINK TWICE A DAY - HOLD FOR LOOSE STOOLS. MAY GIVE UP TO 3 TIMES DAILY PERNURSING GUIDELINES 10/17/23   Lam, Yee, MD  PRESERVISION AREDS-2 250-90-40-1 mg cap TAKE 1 CAPSULE BY MOUTH 2 TIMES PER DAY 07/29/19   Deans, Norleen Scarce, MD  QUEtiapine  (SEROQUEL ) 100 MG tablet Take 1.5 tablets (150 mg total) by mouth Three (3) times a day. 09/26/23 03/24/24  Sherrel Reynolds CROME, MD  QUEtiapine  (SEROQUEL ) 25 MG tablet Take 1 tablet (25 mg total) by mouth daily as needed  (agitation) for up to 60 doses. 01/17/23   Easter Vernell RAMAN, MD  rosuvastatin  (CRESTOR ) 20 MG tablet TAKE 1 TABLET BY MOUTH ONCE DAILY 11/20/23   Lam, Yee, MD  SENNA 8.6 mg tablet TAKE 2 TABLETS BY MOUTH NIGHTLY 01/09/24   Lam, Yee, MD  witch hazel-glycerin (TUCKS) pad Apply topically to the affected area as needed. Patient taking differently: Apply topically to the affected area as needed. PRN 08/25/13   Dallie Vernell Norris, MD    ALLERGIES: Allergies[4]  REVIEW OF SYSTEMS: Significant for none, otherwise negative upon 10-system review other than what is mentioned in the HPI.   PHYSICAL EXAM GENERAL: Pleasant male in no acute distress.  VITAL SIGNS: There were no vitals taken for this visit. HEENT: Normocephalic, atraumatic, extraocular muscles intact NECK: Trachea midline PULMONARY: Normal work of breathing, no use of accessory muscles ABDOMEN: Not significantly distended NEUROLOGIC:  Cranial nerves II-XII grossly intact PSYCHOLOGIC: Normal affect, normal mood SKIN: Warm and dry. No lesions. GU: deferred   LABS AND STUDIES Pertinent labs include Creatinine 1.45 and GFR 55  Imaging personally reviewed by me and with the patient.  Imaging shows a 9.1 cm complex cyst on the left side. Nodular component posteriorly.       [1] Past Medical History: Diagnosis Date   Adjustment disorder lifelong   currently with any change in routine   Age-related nuclear cataract of both eyes 07/18/2017   Anemia March 31   low hemoglobin in past but appears pale   Anxiety    ongoing - under psychiatric care   Arthritis 02/07/2007   significant degen spine and hips   Autism spectrum disorder (HHS-HCC) childhood   Pervasive Developmental Disorders   Basal cell carcinoma    scalp,face   Binge eating adulthood ?   Cancer    (CMS-HCC) 02/07/2011   basal cell, scalp   CKD (chronic kidney disease) stage 3, GFR 30-59 ml/min (CMS-HCC) 12/20/2018   Cognitive impairment    mild  MR, pervasive devel disorders   Coronary artery disease    Pacemaker   Current Outpatient Treatment jan 2012   STEP then [facility]   Delirium 2010-11   Depression 02/07/2008   OCD, depression, anxiety   Depressive disorder 2010   Developmental delay    Gait abnormality    Generalized anxiety disorder    Hypertension 02/06/2010   currently under control (wt loss, exercise)   Illiteracy and low-level literacy    Intellectual disability    mild mental retardation; severe adaptation disorder   Loss of consciousness    (CMS-HCC)    seizures   Major depressive disorder    Memory loss    Mental/behavioral problem 02/06/2006   severe OCD;  aggressive at times   MHS (malignant hyperthermia)    Neuromuscular disorder (CMS-HCC)    malignant hyperthermia; 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   Obesity ongoing   Obsessive-compulsive disorder early childhood   reported by family; diagnosis as adult   Oppositional defiant disorder    Pancytopenia (CMS-HCC) 10/31/2014   Prior Outpatient Treatment/Testing 2010-11   [place]   Problem with transportation    requires continuous 1:1 due to behaviors   Psychiatric Hospitalizations Sept 2011   stabilization following neuroleptic syndroms   Raynaud disease    Risk for falls 02/10/2015   Schizoaffective disorder    (CMS-HCC)    see above   Seizures    (CMS-HCC) early childhood   epilepsy; last episode approx 1 year ago   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   see above   Venous insufficiency    Violence/Aggression    family reports occasional occurences lifelong   Visual impairment    corrected effectively with bifocals  [2] Past Surgical History: Procedure Laterality Date   FINGER SURGERY     INSERT / REPLACE / REMOVE PACEMAKER     PR COLONOSCOPY FLX DX W/COLLJ SPEC WHEN PFRMD N/A 10/07/2019   Procedure: COLONOSCOPY, FLEXIBLE, PROXIMAL TO SPLENIC FLEXURE; DIAGNOSTIC, W/WO COLLECTION SPECIMEN BY BRUSH  OR WASH;  Surgeon: Krystal Jona Matter, MD;  [facility];  Service: Gastroenterology   PR COLONOSCOPY FLX DX W/COLLJ SPEC WHEN PFRMD Left 07/19/2021   Procedure: COLONOSCOPY, FLEXIBLE, PROXIMAL TO SPLENIC FLEXURE; DIAGNOSTIC, W/WO COLLECTION SPECIMEN BY BRUSH OR WASH;  Surgeon: Olam Earnie Henle, MD;  Location: [facility];  Service: Gastroenterology   PR COLONOSCOPY W/BIOPSY SINGLE/MULTIPLE N/A 07/09/2013   Procedure: COLONOSCOPY, FLEXIBLE, PROXIMAL TO SPLENIC FLEXURE; WITH BIOPSY, SINGLE OR MULTIPLE;  Surgeon: Alphonsa Lav, MD;  [facility];  Service: Gastroenterology   PR INSER HART ROSENA PILOT ATR/VENTR Right 11/12/2014   Procedure: Implant Pacemaker, RIGHT chest;  Surgeon: Sherwood VEAR Lawyer, MD;  Location: [facility];  Service: Cardiology   SKIN BIOPSY    [3] Social History Socioeconomic History   Marital status: Single  Tobacco Use   Smoking status: Never   Smokeless tobacco: Never  Vaping Use   Vaping status: Never Used  Substance and Sexual Activity   Alcohol use: Never   Drug use: Never   Sexual activity: Not Currently  Other Topics Concern   Do you use sunscreen? Yes   Tanning bed use? No   Are you easily burned? No   Excessive sun exposure? No   Blistering sunburns? No   Social Drivers of Health   Food Insecurity: No Food Insecurity (11/22/2023)   Received from [facility]   Hunger Vital Sign    Within the past 12 months, you worried that your food would run out before you got the money to buy more.: Never true    Within the past 12 months, the food you bought just didn't last and you didn't have money to get more.: Never true  Tobacco Use: Low Risk  (12/21/2023)   Received from [facility]   Patient History    Smoking Tobacco Use: Never    Smokeless Tobacco Use: Never  Transportation Needs: No Transportation Needs (11/22/2023)   Received from [facility] -  Transportation    In the past 12 months, has lack of transportation kept you from medical appointments or from getting medications?: No    In the past 12 months, has lack of transportation kept  you from meetings, work, or from getting things needed for daily living?: No  Alcohol Use: Not At Risk (11/29/2023)   Received from [facility]   AUDIT-C    Q1: How often do you have a drink containing alcohol?: Never    Q2: How many drinks containing alcohol do you have on a typical day when you are drinking?: Patient does not drink    Q3: How often do you have six or more drinks on one occasion?: Never  Housing: Low Risk (09/18/2023)   Housing    Within the past 12 months, have you ever stayed: outside, in a car, in a tent, in an overnight shelter, or temporarily in someone else's home (i.e. couch-surfing)?: No    Are you worried about losing your housing?: No  Utilities: Not At Risk (11/22/2023)   Received from [facility] Utilities    In the past 12 months has the electric, gas, oil, or water company threatened to shut off services in your home?: No  Interpersonal Safety: Patient Unable To Answer (11/13/2023)   Interpersonal Safety    Unsafe Where You Currently Live: Patient unable to answer    Physically Hurt by Anyone: Patient unable to answer    Abused by Anyone: Patient unable to answer  Financial Resource Strain: Low Risk (12/28/2022)   Overall Financial Resource Strain (CARDIA)    Difficulty of Paying Living Expenses: Not hard at all  Internet Connectivity: Internet connectivity concern unknown (12/28/2022)   Internet Connectivity    Do you have access to internet services: Yes  [4] Allergies Allergen Reactions   Other Other (See Comments)    Malignant Hyperthermia Episode in 1988 per records. Avoid triggering agents. Anesthesia.   Hctz [Hydrochlorothiazide] Other (See Comments)    hyponatremia   Lisinopril Other (See Comments)    Acute kidney  injury: Creatinine increased by over 50%   Levetiracetam    Phenytoin    Phenytoin Sodium Extended    Risperidone Analogues

## 2024-01-15 NOTE — ED Provider Notes (Signed)
 [facility] Provider Note    Event Date/Time   First MD Initiated Contact with Patient 01/15/24 2224     (approximate)   History   Fall   HPI  Frank Conley is a 63 y.o. male with a past medical history of seizures, developmental delay, pacemaker not on any blood thinners who resides in a group home who presents with bilateral knee pain.  Patient was in his usual state of health when he became involved in an argument with another resident over a teddy bear.  He did have a fall onto his buttocks.  Denies any head strike but reported bilateral knee pain to his group facilitator afterwards.  He has been able to ambulate since then.  He denies any headache, neck pain chest pain shortness of breath abdominal pain changes in urinary bowel habits.  Per caregiver at bedside he is at his baseline mental status      Physical Exam   Triage Vital Signs: ED Triage Vitals  Encounter Vitals Group     BP 01/15/24 2046 (!) 142/107     Girls Systolic BP Percentile --      Girls Diastolic BP Percentile --      Boys Systolic BP Percentile --      Boys Diastolic BP Percentile --      Pulse Rate 01/15/24 2046 64     Resp 01/15/24 2046 18     Temp 01/15/24 2046 97.6 F (36.4 C)     Temp Source 01/15/24 2046 Oral     SpO2 01/15/24 2046 98 %     Weight 01/15/24 2046 180 lb (81.6 kg)     Height 01/15/24 2046 5' 8 (1.727 m)     Head Circumference --      Peak Flow --      Pain Score 01/15/24 2048 9     Pain Loc --      Pain Education --      Exclude from Growth Chart --     Most recent vital signs: Vitals:   01/15/24 2046  BP: (!) 142/107  Pulse: 64  Resp: 18  Temp: 97.6 F (36.4 C)  SpO2: 98%    Nursing Triage Note reviewed. Vital signs reviewed and patients oxygen saturation is normoxic  General: Patient is well nourished, well developed, awake and alert, resting comfortably in no acute distress Head: Normocephalic and atraumatic Eyes: Normal  inspection, extraocular muscles intact, no conjunctival pallor Ear, nose, throat: Normal external exam Neck: Normal range of motion Respiratory: Patient is in no respiratory distress, lungs CTAB Cardiovascular: Patient is not tachycardic, RRR without murmur appreciated GI: Abd SNT with no guarding or rebound  Back: Normal inspection of the back with good strength and range of motion throughout all ext Extremities: pulses intact with good cap refills, no LE pitting edema or calf tenderness Patient able to straight leg lift bilaterally bend at the knee bilaterally and flex extend his toes.  He is able to ambulate without abnormality.  Lachman test and posterior drawer is negative bilaterally Neuro: The patient is alert and oriented to person, place, and time, appropriately conversive, with 5/5 bilat UE/LE strength, no gross motor or sensory defects noted. Coordination appears to be adequate. Skin: Warm, dry, and intact Psych: normal mood and affect, no SI or HI  ED Results / Procedures / Treatments   Labs (all labs ordered are listed, but only abnormal results are displayed) Labs Reviewed - No data to display  EKG   RADIOLOGY Xray right knee: No acute abnormality on my independent review interpretation radiologist agrees X-ray left knee: No acute abnormality on my independent review interpretation radiologist agrees    PROCEDURES:  Critical Care performed: No  Procedures   MEDICATIONS ORDERED IN ED: Medications - No data to display   IMPRESSION / MDM / ASSESSMENT AND PLAN / ED COURSE                                Differential diagnosis includes, but is not limited to, fracture, arthritis, contusion, muscle strain   ED course: Patient is well-appearing and has no focal neurological deficits.  X-rays of the bilateral knees were unremarkable.  Patient was able to ambulate and I have no concern for ligamentous injury.  I do think his symptoms are most consistent with a  muscle strain and he was advised that he may be sore for several days.  I did consider possibly ordering a CT scan of his head but he is not on any anticoagulation denies headache or head strike and so I do not think this is warranted at this time and he is in agreement.  All questions answered and patient and caregiver voiced understanding and requested discharge  At time of discharge there is no evidence of acute life, limb, vision, or fertility threat. Patient has stable vital signs, pain is well controlled, patient is ambulatory and p.o. tolerant.  Discharge instructions were completed using the EPIC system. I would refer you to those at this time. All warnings prescriptions follow-up etc. were discussed in detail with the patient. Patient indicates understanding and is agreeable with this plan. All questions answered.  Patient is made aware that they may return to the emergency department for any worsening or new condition or for any other emergency.      --  COPA: 5 The patient has the following acute or chronic illness/injury that poses a possible threat to life or bodily function: [X] : The patient has a potentially serious acute condition or an acute exacerbation of a chronic illness requiring urgent evaluation and management in the Emergency Department. The clinical presentation necessitates immediate consideration of life-threatening or function-threatening diagnoses, even if they are ultimately ruled out.   FINAL CLINICAL IMPRESSION(S) / ED DIAGNOSES   Final diagnoses:  Acute pain of both knees  Muscle strain     Rx / DC Orders   ED Discharge Orders     None        Note:  This document was prepared using Dragon voice recognition software and may include unintentional dictation errors.   Nicholaus Rolland BRAVO, MD 01/15/24 2251

## 2024-01-15 NOTE — Discharge Instructions (Signed)
 You was seen in our emergency department after a fall with bilateral knee pain.  X-rays of both knees were unremarkable and on exam I do think your symptoms are consistent with a muscle strain.  Please massage the area.  Take over-the-counter medication as needed.  Follow-up with your primary care physician and return if any acutely worsening symptoms. -- RETURN PRECAUTIONS & AFTERCARE: (ENGLISH) RETURN PRECAUTIONS: Return immediately to the emergency department or see/call your doctor if you feel worse, weak or have changes in speech or vision, are short of breath, have fever, vomiting, pain, bleeding or dark stool, trouble urinating or any new issues. Return here or see/call your doctor if not improving as expected for your suspected condition. FOLLOW-UP CARE: Call your doctor and/or any doctors we referred you to for more advice and to make an appointment. Do this today, tomorrow or after the weekend. Some doctors only take PPO insurance so if you have HMO insurance you may want to contact your HMO or your regular doctor for referral to a specialist within your plan. Either way tell the doctor's office that it was a referral from the emergency department so you get the soonest possible appointment.  YOUR TEST RESULTS: Take result reports of any blood or urine tests, imaging tests and EKG's to your doctor and any referral doctor. Have any abnormal tests repeated. Your doctor or a referral doctor can let you know when this should be done. Also make sure your doctor contacts this hospital to get any test results that are not currently available such as cultures or special tests for infection and final imaging reports, which are often not available at the time you leave the ER but which may list additional important findings that are not documented on the preliminary report. BLOOD PRESSURE: If your blood pressure was greater than 120/80 have your blood pressure rechecked within 1 to 2 weeks. MEDICATION SIDE  EFFECTS: Do not drive, walk, bike, take the bus, etc. if you have received or are being prescribed any sedating medications such as those for pain or anxiety or certain antihistamines like Benadryl. If you have been give one of these here get a taxi home or have a friend drive you home. Ask your pharmacist to counsel you on potential side effects of any new medication

## 2024-01-16 ENCOUNTER — Ambulatory Visit: Payer: Self-pay | Admitting: Physician Assistant

## 2024-01-16 ENCOUNTER — Ambulatory Visit: Admitting: Physician Assistant

## 2024-01-16 ENCOUNTER — Encounter: Payer: Self-pay | Admitting: Physician Assistant

## 2024-01-16 VITALS — BP 120/82 | Wt 182.0 lb

## 2024-01-16 DIAGNOSIS — S065XAA Traumatic subdural hemorrhage with loss of consciousness status unknown, initial encounter: Secondary | ICD-10-CM

## 2024-01-16 DIAGNOSIS — R2681 Unsteadiness on feet: Secondary | ICD-10-CM

## 2024-01-16 NOTE — Patient Instructions (Addendum)
 It was a pleasure to see you today in clinic.  Your previously seen small right parafalcine subdural hematoma has resolved, which is great news.  At this point we do not need updated head imaging to follow this further.  However, if you have another fall or start experiencing any new neurologic symptoms, I would advise you to go to the emergency department to be evaluated.  In regards to your frequent falls, one thing we did talk about today was the possibility of cervical myelopathy being one of the contributing factors to your frequent falls.  We will wait to explore the possibility of this further with an MRI of your cervical spine potentially in the future.  Continue to follow with neurology and urology.  Please reach out to me if you have any questions or concerns in the meantime.  Due to your gait instability, we will reinstitute the use of your rolling walker while at your group home.  A new order was placed for this.  If they have any issues implementing this please let me know.  Continuing physical therapy in the future could also be of great help.  Please let me know if you want me to place an updated referral for this.

## 2024-02-08 ENCOUNTER — Emergency Department

## 2024-02-08 ENCOUNTER — Other Ambulatory Visit: Payer: Self-pay

## 2024-02-08 ENCOUNTER — Inpatient Hospital Stay
Admission: EM | Admit: 2024-02-08 | Discharge: 2024-02-09 | DRG: 194 | Disposition: A | Discharge status: Home Health | Attending: Internal Medicine | Admitting: Internal Medicine

## 2024-02-08 DIAGNOSIS — D638 Anemia in other chronic diseases classified elsewhere: Secondary | ICD-10-CM | POA: Insufficient documentation

## 2024-02-08 DIAGNOSIS — Z1152 Encounter for screening for COVID-19: Secondary | ICD-10-CM

## 2024-02-08 DIAGNOSIS — Z79899 Other long term (current) drug therapy: Secondary | ICD-10-CM | POA: Insufficient documentation

## 2024-02-08 DIAGNOSIS — R680 Hypothermia, not associated with low environmental temperature: Secondary | ICD-10-CM | POA: Insufficient documentation

## 2024-02-08 DIAGNOSIS — J189 Pneumonia, unspecified organism: Principal | ICD-10-CM | POA: Diagnosis present

## 2024-02-08 DIAGNOSIS — R6250 Unspecified lack of expected normal physiological development in childhood: Secondary | ICD-10-CM | POA: Diagnosis present

## 2024-02-08 DIAGNOSIS — Z888 Allergy status to other drugs, medicaments and biological substances status: Secondary | ICD-10-CM

## 2024-02-08 DIAGNOSIS — T68XXXA Hypothermia, initial encounter: Secondary | ICD-10-CM

## 2024-02-08 DIAGNOSIS — I10 Essential (primary) hypertension: Secondary | ICD-10-CM | POA: Diagnosis present

## 2024-02-08 DIAGNOSIS — G40909 Epilepsy, unspecified, not intractable, without status epilepticus: Secondary | ICD-10-CM | POA: Diagnosis present

## 2024-02-08 DIAGNOSIS — Z6827 Body mass index (BMI) 27.0-27.9, adult: Secondary | ICD-10-CM | POA: Insufficient documentation

## 2024-02-08 DIAGNOSIS — D696 Thrombocytopenia, unspecified: Secondary | ICD-10-CM | POA: Diagnosis present

## 2024-02-08 DIAGNOSIS — Z66 Do not resuscitate: Secondary | ICD-10-CM | POA: Diagnosis present

## 2024-02-08 DIAGNOSIS — I129 Hypertensive chronic kidney disease with stage 1 through stage 4 chronic kidney disease, or unspecified chronic kidney disease: Secondary | ICD-10-CM | POA: Insufficient documentation

## 2024-02-08 DIAGNOSIS — R531 Weakness: Principal | ICD-10-CM | POA: Insufficient documentation

## 2024-02-08 DIAGNOSIS — N1832 Chronic kidney disease, stage 3b: Secondary | ICD-10-CM | POA: Diagnosis present

## 2024-02-08 DIAGNOSIS — Z7982 Long term (current) use of aspirin: Secondary | ICD-10-CM | POA: Insufficient documentation

## 2024-02-08 DIAGNOSIS — N189 Chronic kidney disease, unspecified: Secondary | ICD-10-CM | POA: Diagnosis present

## 2024-02-08 DIAGNOSIS — N179 Acute kidney failure, unspecified: Secondary | ICD-10-CM | POA: Insufficient documentation

## 2024-02-08 DIAGNOSIS — N2889 Other specified disorders of kidney and ureter: Secondary | ICD-10-CM | POA: Diagnosis present

## 2024-02-08 DIAGNOSIS — I495 Sick sinus syndrome: Secondary | ICD-10-CM | POA: Diagnosis present

## 2024-02-08 DIAGNOSIS — Z950 Presence of cardiac pacemaker: Secondary | ICD-10-CM | POA: Insufficient documentation

## 2024-02-08 DIAGNOSIS — F849 Pervasive developmental disorder, unspecified: Secondary | ICD-10-CM | POA: Diagnosis present

## 2024-02-08 DIAGNOSIS — D472 Monoclonal gammopathy: Secondary | ICD-10-CM | POA: Diagnosis present

## 2024-02-08 DIAGNOSIS — E663 Overweight: Secondary | ICD-10-CM | POA: Diagnosis present

## 2024-02-08 DIAGNOSIS — E785 Hyperlipidemia, unspecified: Secondary | ICD-10-CM | POA: Insufficient documentation

## 2024-02-08 DIAGNOSIS — D631 Anemia in chronic kidney disease: Secondary | ICD-10-CM | POA: Insufficient documentation

## 2024-02-08 LAB — COMPREHENSIVE METABOLIC PANEL WITH GFR
ALT: 23 U/L (ref 0–44)
AST: 42 U/L — ABNORMAL HIGH (ref 15–41)
Albumin: 3.8 g/dL (ref 3.5–5.0)
Alkaline Phosphatase: 125 U/L (ref 38–126)
Anion gap: 11 (ref 5–15)
BUN: 38 mg/dL — ABNORMAL HIGH (ref 8–23)
CO2: 25 mmol/L (ref 22–32)
Calcium: 9.4 mg/dL (ref 8.9–10.3)
Chloride: 106 mmol/L (ref 98–111)
Creatinine, Ser: 2.02 mg/dL — ABNORMAL HIGH (ref 0.61–1.24)
GFR, Estimated: 37 mL/min — ABNORMAL LOW
Glucose, Bld: 56 mg/dL — ABNORMAL LOW (ref 70–99)
Potassium: 4.8 mmol/L (ref 3.5–5.1)
Sodium: 142 mmol/L (ref 135–145)
Total Bilirubin: 0.3 mg/dL (ref 0.0–1.2)
Total Protein: 6.6 g/dL (ref 6.5–8.1)

## 2024-02-08 LAB — CBC
HCT: 28.2 % — ABNORMAL LOW (ref 39.0–52.0)
Hemoglobin: 9.1 g/dL — ABNORMAL LOW (ref 13.0–17.0)
MCH: 31.5 pg (ref 26.0–34.0)
MCHC: 32.3 g/dL (ref 30.0–36.0)
MCV: 97.6 fL (ref 80.0–100.0)
Platelets: 85 K/uL — ABNORMAL LOW (ref 150–400)
RBC: 2.89 MIL/uL — ABNORMAL LOW (ref 4.22–5.81)
RDW: 17.1 % — ABNORMAL HIGH (ref 11.5–15.5)
WBC: 4.7 K/uL (ref 4.0–10.5)
nRBC: 0 % (ref 0.0–0.2)

## 2024-02-08 LAB — RESP PANEL BY RT-PCR (RSV, FLU A&B, COVID)  RVPGX2
Influenza A by PCR: NEGATIVE
Influenza B by PCR: NEGATIVE
Resp Syncytial Virus by PCR: NEGATIVE
SARS Coronavirus 2 by RT PCR: NEGATIVE

## 2024-02-08 LAB — PROCALCITONIN: Procalcitonin: 0.17 ng/mL

## 2024-02-08 LAB — LACTIC ACID, PLASMA
Lactic Acid, Venous: 0.4 mmol/L — ABNORMAL LOW (ref 0.5–1.9)
Lactic Acid, Venous: 1 mmol/L (ref 0.5–1.9)

## 2024-02-08 LAB — TROPONIN T, HIGH SENSITIVITY
Troponin T High Sensitivity: 30 ng/L — ABNORMAL HIGH (ref 0–19)
Troponin T High Sensitivity: 27 ng/L — ABNORMAL HIGH (ref 0–19)

## 2024-02-08 LAB — MRSA NEXT GEN BY PCR, NASAL: MRSA by PCR Next Gen: NOT DETECTED

## 2024-02-08 LAB — HIV ANTIBODY (ROUTINE TESTING W REFLEX): HIV Screen 4th Generation wRfx: NONREACTIVE

## 2024-02-08 MED ORDER — SODIUM CHLORIDE 0.9 % IV SOLN
2.0000 g | Freq: Once | INTRAVENOUS | Status: AC
  Administered 2024-02-08: 2 g via INTRAVENOUS
  Filled 2024-02-08: qty 12.5

## 2024-02-08 MED ORDER — LAMOTRIGINE 100 MG PO TABS
100.0000 mg | ORAL_TABLET | Freq: Two times a day (BID) | ORAL | Status: DC
  Administered 2024-02-08 – 2024-02-09 (×2): 100 mg via ORAL
  Filled 2024-02-08: qty 1
  Filled 2024-02-08 (×2): qty 4
  Filled 2024-02-08: qty 1

## 2024-02-08 MED ORDER — SENNA 8.6 MG PO TABS
2.0000 | ORAL_TABLET | Freq: Every day | ORAL | Status: DC
  Administered 2024-02-08: 17.2 mg via ORAL
  Filled 2024-02-08: qty 2

## 2024-02-08 MED ORDER — ACETAMINOPHEN 650 MG RE SUPP: 650.0000 mg | Freq: Four times a day (QID) | RECTAL | Status: DC | PRN

## 2024-02-08 MED ORDER — ONDANSETRON HCL 4 MG/2ML IJ SOLN: 4.0000 mg | Freq: Four times a day (QID) | INTRAMUSCULAR | Status: DC | PRN

## 2024-02-08 MED ORDER — DM-GUAIFENESIN ER 30-600 MG PO TB12: 1.0000 | ORAL_TABLET | Freq: Two times a day (BID) | ORAL | Status: DC | PRN

## 2024-02-08 MED ORDER — FERROUS SULFATE 325 (65 FE) MG PO TABS
325.0000 mg | ORAL_TABLET | Freq: Every day | ORAL | Status: DC
  Administered 2024-02-09: 325 mg via ORAL
  Filled 2024-02-08: qty 1

## 2024-02-08 MED ORDER — MELATONIN 5 MG PO TABS
2.5000 mg | ORAL_TABLET | Freq: Every evening | ORAL | Status: DC
  Administered 2024-02-08: 2.5 mg via ORAL
  Filled 2024-02-08: qty 1

## 2024-02-08 MED ORDER — QUETIAPINE FUMARATE 25 MG PO TABS
150.0000 mg | ORAL_TABLET | Freq: Every day | ORAL | Status: DC
  Administered 2024-02-08: 150 mg via ORAL
  Filled 2024-02-08: qty 6

## 2024-02-08 MED ORDER — SODIUM CHLORIDE 0.9 % IV SOLN: INTRAVENOUS | Status: DC

## 2024-02-08 MED ORDER — FENTANYL CITRATE (PF) 50 MCG/ML IJ SOSY: 25.0000 ug | PREFILLED_SYRINGE | INTRAMUSCULAR | Status: DC | PRN

## 2024-02-08 MED ORDER — LACTATED RINGERS IV BOLUS: 1000.0000 mL | Freq: Once | INTRAVENOUS | Status: DC

## 2024-02-08 MED ORDER — QUETIAPINE FUMARATE 25 MG PO TABS
100.0000 mg | ORAL_TABLET | Freq: Two times a day (BID) | ORAL | Status: DC
  Administered 2024-02-08 – 2024-02-09 (×2): 100 mg via ORAL
  Filled 2024-02-08 (×2): qty 4

## 2024-02-08 MED ORDER — CLONIDINE HCL 0.2 MG/24HR TD PTWK: 0.2000 mg | MEDICATED_PATCH | TRANSDERMAL | Status: DC

## 2024-02-08 MED ORDER — ACETAMINOPHEN 325 MG PO TABS
650.0000 mg | ORAL_TABLET | Freq: Four times a day (QID) | ORAL | Status: DC | PRN
  Filled 2024-02-08: qty 2

## 2024-02-08 MED ORDER — SODIUM CHLORIDE 0.9 % IV SOLN
500.0000 mg | INTRAVENOUS | Status: DC
  Administered 2024-02-08: 500 mg via INTRAVENOUS
  Filled 2024-02-08 (×2): qty 5

## 2024-02-08 MED ORDER — OXYCODONE HCL 5 MG PO TABS: 5.0000 mg | ORAL_TABLET | ORAL | Status: DC | PRN

## 2024-02-08 MED ORDER — CLONAZEPAM 0.5 MG PO TABS
0.5000 mg | ORAL_TABLET | Freq: Three times a day (TID) | ORAL | Status: DC
  Administered 2024-02-08 – 2024-02-09 (×3): 0.5 mg via ORAL
  Filled 2024-02-08 (×3): qty 1

## 2024-02-08 MED ORDER — ROSUVASTATIN CALCIUM 20 MG PO TABS
20.0000 mg | ORAL_TABLET | Freq: Every day | ORAL | Status: DC
  Administered 2024-02-08: 20 mg via ORAL
  Filled 2024-02-08: qty 1

## 2024-02-08 MED ORDER — ENOXAPARIN SODIUM 40 MG/0.4ML IJ SOSY
40.0000 mg | PREFILLED_SYRINGE | Freq: Every day | INTRAMUSCULAR | Status: DC
  Administered 2024-02-08: 40 mg via SUBCUTANEOUS
  Filled 2024-02-08: qty 0.4

## 2024-02-08 MED ORDER — SODIUM CHLORIDE 0.9 % IV SOLN: 2.0000 g | INTRAVENOUS | Status: DC

## 2024-02-08 MED ORDER — ASPIRIN 81 MG PO TBEC
81.0000 mg | DELAYED_RELEASE_TABLET | Freq: Every day | ORAL | Status: DC
  Administered 2024-02-09: 81 mg via ORAL
  Filled 2024-02-08: qty 1

## 2024-02-08 MED ORDER — VANCOMYCIN HCL IN DEXTROSE 1-5 GM/200ML-% IV SOLN
1000.0000 mg | Freq: Once | INTRAVENOUS | Status: AC
  Administered 2024-02-08: 1000 mg via INTRAVENOUS
  Filled 2024-02-08: qty 200

## 2024-02-08 MED ORDER — POLYETHYLENE GLYCOL 3350 17 G PO PACK: 17.0000 g | PACK | Freq: Every day | ORAL | Status: DC | PRN

## 2024-02-08 MED ORDER — QUETIAPINE FUMARATE 25 MG PO TABS: 100.0000 mg | ORAL_TABLET | ORAL | Status: DC

## 2024-02-08 MED ORDER — SODIUM CHLORIDE 0.9 % IV SOLN
2.0000 g | Freq: Two times a day (BID) | INTRAVENOUS | Status: DC
  Administered 2024-02-09: 2 g via INTRAVENOUS
  Filled 2024-02-08 (×2): qty 12.5

## 2024-02-08 MED ORDER — SODIUM CHLORIDE 0.9 % IV BOLUS
1000.0000 mL | Freq: Once | INTRAVENOUS | Status: AC
  Administered 2024-02-08: 1000 mL via INTRAVENOUS

## 2024-02-08 MED ORDER — ONDANSETRON HCL 4 MG PO TABS: 4.0000 mg | ORAL_TABLET | Freq: Four times a day (QID) | ORAL | Status: DC | PRN

## 2024-02-08 NOTE — Care Plan (Signed)
 Part of admission questions were answered by patient with help of brother at bedside

## 2024-02-08 NOTE — ED Provider Notes (Signed)
 "  [facility] Provider Note    Event Date/Time   First MD Initiated Contact with Patient 02/08/24 [phone]     (approximate)  History   Chief Complaint: Chest Pain  HPI  BION TODOROV is a 64 y.o. male with a past medical history of hypertension, depression, presents from his group facility with complaint of chest pain/body aches.  According to report patient's care provider states this morning he was acting more fatigued than normal and not as energetic as he usually is.  When she asked what was wrong patient was complaining of some pain in his chest and his legs.  No reported fever.  No cough or congestion.  No other symptoms known to the caregiver.  Unknown cardiac medical history besides the patient has a pacemaker per caregiver.  Physical Exam   Triage Vital Signs: ED Triage Vitals  Encounter Vitals Group     BP 02/08/24 0950 (!) 126/58     Girls Systolic BP Percentile --      Girls Diastolic BP Percentile --      Boys Systolic BP Percentile --      Boys Diastolic BP Percentile --      Pulse Rate 02/08/24 0950 (!) 58     Resp 02/08/24 0950 15     Temp --      Temp src --      SpO2 02/08/24 0947 98 %     Weight 02/08/24 0956 179 lb 14.3 oz (81.6 kg)     Height 02/08/24 0956 5' 8 (1.727 m)     Head Circumference --      Peak Flow --      Pain Score --      Pain Loc --      Pain Education --      Exclude from Growth Chart --     Most recent vital signs: Vitals:   02/08/24 0947 02/08/24 0950  BP:  (!) 126/58  Pulse:  (!) 58  Resp:  15  SpO2: 98% 100%    General: Awake, no distress.  CV:  Good peripheral perfusion.  Regular rate and rhythm  Resp:  Normal effort.  Equal breath sounds bilaterally.  Abd:  No distention.  Soft, nontender.  No rebound or guarding.  ED Results / Procedures / Treatments   EKG  EKG viewed and interpreted by myself shows some atrial paced rhythm at 60 bpm with narrow QRS, normal axis, PR prolongation otherwise  normal intervals with no concerning ST changes.  RADIOLOGY  I have reviewed and interpreted the chest x-ray images.  Possible right-sided opacity on my evaluation.  Pacemaker present. Radiology states minimal left basilar atelectasis or infiltrate.   MEDICATIONS ORDERED IN ED: Medications  lactated ringers bolus 1,000 mL (has no administration in time range)     IMPRESSION / MDM / ASSESSMENT AND PLAN / ED COURSE  I reviewed the triage vital signs and the nursing notes.  Patient's presentation is most consistent with acute presentation with potential threat to life or bodily function.  Patient presents to the emergency department for decreased responsiveness/weakness as well as some chest discomfort.  Here the patient does appear somewhat somnolent and weak.  Patient is unable to contribute to his history but his brother is here who states the patient has a history of aspiration and pneumonia in the past.  Patient found to be hypothermic 93.8 degrees rectally on arrival, placed under a Lawyer.  Patient's lab work today  overall reassuring with a normal white blood cell count no significant CBC findings, chemistry shows renal insufficiency with a creatinine around 2.0 from a baseline around 1.6/1.7.  Troponin minimally elevated at 30 likely due to the chronic kidney disease we will recheck after 2 hours.  Patient's respiratory panel is negative.  Chest x-ray shows possible infiltrate given the patient's hypothermia and weakness with possible infiltrate we will obtain a CT scan of chest to further evaluate.  CT consistent with left lower lobe airspace opacity.  Given likely pneumonia with weakness and hypothermia we will start IV antibiotics we will admit to the hospital service for further workup and treatment.  FINAL CLINICAL IMPRESSION(S) / ED DIAGNOSES   Left lower lobe pneumonia Hypothermia Weakness  Note:  This document was prepared using Dragon voice recognition software and may  include unintentional dictation errors.   Dorothyann Drivers, MD 02/08/24 1239  "

## 2024-02-08 NOTE — Plan of Care (Signed)

## 2024-02-08 NOTE — ED Triage Notes (Signed)
 Pt BIB EMS from [facility] with complaints of chest pain. Pt was unable to describe chest pain. Pt does have pacemaker.

## 2024-02-08 NOTE — H&P (Addendum)
 "  History and Physical    NATHANIEL WAKELEY FMW:[id number] DOB: [birth year] DOA: 02/08/2024  DOS: the patient was seen and examined on 02/08/2024  PCP: Lizette Lango, MD   Patient coming from: Group Home  I have personally briefly reviewed patient's old medical records in [facility]  Chief Complaint: Chest pain this morning  HPI: DARELLE KINGS is a pleasant 64 y.o. male with medical history significant for developmental delay, SSS s/p PPM not on anticoagulation, seizure disorder, renal mass on ongoing workup and treatment plan, HTN, depression, MGUS who was brought into [facility] emergency room for body aches and pain, chest discomfort.  Patient was at afternoon program while he was found to be fatigued than normal and complained of not feeling well and chest discomfort complaining of pains in other parts of the body as well.  Patient denies any fever, denies any nausea, vomiting, cough, congestion, dysuria, hematuria.  Caregiver confirmed that patient was well until this morning.  ED Course: Upon arrival to the ED, patient is found to be hypothermic at 93.8, creatinine at 2.02, normal it is 1.67, negative COVID RSV and flu, pending urine analysis.  Chest x-ray showed left-sided infiltrate.  Blood cultures were sent.  Patient was given vancomycin, cefepime and azithromycin .  Hospitalist service was consulted for evaluation for admission  Review of Systems:  ROS  All other systems negative except as noted in the HPI.  Past Medical History:  Diagnosis Date   Depression    Hypertension    MGUS (monoclonal gammopathy of unknown significance)    Pervasive developmental disorder    Seizures (HCC)    SSS (sick sinus syndrome) (HCC)    PPM placed    Past Surgical History:  Procedure Laterality Date   PACEMAKER IMPLANT       reports that he has never smoked. He has never used smokeless tobacco. He reports that he does not drink alcohol and does not use drugs.  Allergies[1]  Family History   Family history unknown: Yes    Prior to Admission medications  Medication Sig Start Date End Date Taking? Authorizing Provider  acetaminophen  (TYLENOL ) 325 MG tablet Take 650 mg by mouth every 6 (six) hours as needed for mild pain (pain score 1-3).    [provider]  aspirin  EC 81 MG tablet TAKE 1 TABLET BY MOUTH ONCE DAILY SWALLOW WHOLE 05/09/23   [provider]  clonazePAM  (KLONOPIN ) 0.5 MG disintegrating tablet Take 0.5 mg by mouth 3 (three) times daily. 12/13/18   [provider]  cloNIDine  (CATAPRES  - DOSED IN MG/24 HR) 0.2 mg/24hr patch Place 0.2 mg onto the skin once a week. Thursday 11/29/18   [provider]  cyanocobalamin  (VITAMIN B12) 1000 MCG tablet Take 1,000 mcg by mouth daily. 11/20/23   [provider]  dextromethorphan-guaiFENesin  (MUCINEX  DM) 30-600 MG 12hr tablet Take 1 tablet by mouth 2 (two) times daily as needed for cough. 11/23/23   Caleen Qualia, MD  ferrous sulfate  325 (65 FE) MG tablet Take 325 mg by mouth daily.    [provider]  guaiFENesin  (MUCINEX ) 600 MG 12 hr tablet Take 1 tablet by mouth 2 (two) times daily as needed for cough. 12/26/22   [provider]  lamoTRIgine  (LAMICTAL ) 100 MG tablet Take 100 mg by mouth 2 (two) times daily. 12/04/18   [provider]  lamoTRIgine  (LAMICTAL ) 25 MG tablet Take 50-75 mg by mouth See admin instructions. Take in addition to 100 mg tablet for a  total morning dose of 150mg  and bedtime dose of 175mg  12/04/18   [provider]  loratadine  (CLARITIN ) 10 MG tablet Take 10 mg by mouth daily.    [provider]  Melatonin 3 MG TABS Take 3 mg by mouth every evening.    [provider]  Multiple Vitamins-Minerals (PRESERVISION AREDS 2) CAPS Take 1 capsule by mouth 2 (two) times daily.    [provider]  Omega-3 Fatty Acids (FISH OIL) 1000 MG CAPS Take 2,000 mg by mouth daily.    [provider]  polyethylene glycol  (MIRALAX  / GLYCOLAX ) 17 g packet Take 17 g by mouth in the morning, at noon, and at bedtime.    [provider]  QUEtiapine  (SEROQUEL ) 100 MG tablet Take 100-150 mg by mouth See admin instructions. Take 1 tablet by mouth twice a day then 1.5 tablets at bedtime 12/04/18   [provider]  rosuvastatin  (CRESTOR ) 20 MG tablet Take 1 tablet (20 mg total) by mouth daily. 12/19/22 01/16/24  Briana Elgin LABOR, MD  senna (SENOKOT) 8.6 MG TABS tablet Take 2 tablets by mouth at bedtime. Patient taking differently: Take 2 tablets by mouth daily as needed.    [provider]    Physical Exam: Vitals:   02/08/24 0950 02/08/24 0956 02/08/24 1009 02/08/24 1343  BP: (!) 126/58     Pulse: (!) 58     Resp: 15     Temp:   (!) 93.8 F (34.3 C) (!) 95.8 F (35.4 C)  TempSrc:   Rectal Rectal  SpO2: 100%     Weight:  81.6 kg    Height:  5' 8 (1.727 m)      Physical Exam   Constitutional: Alert, awake, calm, comfortable HEENT: Neck supple Respiratory: Bilateral decreased air entry at the bases, no wheezes no rales no rhonchi Cardiovascular: Regular rate and rhythm, no murmurs / rubs / gallops. No extremity edema. 2+ pedal pulses. No carotid bruits.  Abdomen: Soft, no tenderness, Bowel sounds positive.  Musculoskeletal: no clubbing / cyanosis. Good ROM, no contractures. Normal muscle tone.  Skin: no rashes, lesions, ulcers. Neurologic: CN 2-12 grossly intact. Sensation intact, No focal deficit identified Psychiatric: Alert and oriented x 3. Normal mood.    Labs on Admission: I have personally reviewed following labs and imaging studies  CBC: Recent Labs  Lab 02/08/24 0956  WBC 4.7  HGB 9.1*  HCT 28.2*  MCV 97.6  PLT 85*   Basic Metabolic Panel: Recent Labs  Lab 02/08/24 0956  NA 142  K 4.8  CL 106  CO2 25  GLUCOSE 56*  BUN 38*  CREATININE 2.02*  CALCIUM  9.4   GFR: Estimated Creatinine Clearance: 37.2 mL/min (A) (by C-G formula based on SCr of 2.02 mg/dL  (H)). Liver Function Tests: Recent Labs  Lab 02/08/24 0956  AST 42*  ALT 23  ALKPHOS 125  BILITOT 0.3  PROT 6.6  ALBUMIN 3.8   No results for input(s): LIPASE, AMYLASE in the last 168 hours. No results for input(s): AMMONIA in the last 168 hours. Coagulation Profile: No results for input(s): INR, PROTIME in the last 168 hours. Cardiac Enzymes: No results for input(s): CKTOTAL, CKMB, CKMBINDEX, TROPONINI, TROPONINIHS in the last 168 hours. BNP (last 3 results) Recent Labs    11/22/23 0929  BNP 378.2*   HbA1C: No results for input(s): HGBA1C in the last 72 hours. CBG: No results for input(s): GLUCAP in the last 168 hours. Lipid Profile: No results for input(s): CHOL,  HDL, LDLCALC, TRIG, CHOLHDL, LDLDIRECT in the last 72 hours. Thyroid Function Tests: No results for input(s): TSH, T4TOTAL, FREET4, T3FREE, THYROIDAB in the last 72 hours. Anemia Panel: No results for input(s): VITAMINB12, FOLATE, FERRITIN, TIBC, IRON, RETICCTPCT in the last 72 hours. Urine analysis:    Component Value Date/Time   COLORURINE YELLOW (A) 11/29/2023 1719   APPEARANCEUR CLEAR (A) 11/29/2023 1719   APPEARANCEUR Clear 10/24/2013 2006   LABSPEC 1.014 11/29/2023 1719   LABSPEC 1.006 10/24/2013 2006   PHURINE 5.0 11/29/2023 1719   GLUCOSEU NEGATIVE 11/29/2023 1719   GLUCOSEU 150 mg/dL 90/81/7984 7993   HGBUR NEGATIVE 11/29/2023 1719   BILIRUBINUR NEGATIVE 11/29/2023 1719   BILIRUBINUR Negative 10/24/2013 2006   KETONESUR NEGATIVE 11/29/2023 1719   PROTEINUR 100 (A) 11/29/2023 1719   NITRITE NEGATIVE 11/29/2023 1719   LEUKOCYTESUR NEGATIVE 11/29/2023 1719   LEUKOCYTESUR Negative 10/24/2013 2006    Radiological Exams on Admission: I have personally reviewed images CT CHEST WO CONTRAST Result Date: 02/08/2024 EXAM: CT CHEST WITHOUT CONTRAST 02/08/2024 11:32:49 AM TECHNIQUE: CT of the chest was performed without the administration of  intravenous contrast. Multiplanar reformatted images are provided for review. Automated exposure control, iterative reconstruction, and/or weight based adjustment of the mA/kV was utilized to reduce the radiation dose to as low as reasonably achievable. COMPARISON: None available. CLINICAL HISTORY: Pneumonia, complication suspected, xray done. FINDINGS: MEDIASTINUM: Cardiomegaly. Trace layering pericardial effusion. 3-vessel coronary artery calcifications. Cardiac changes suggestive of anemia. The central airways are clear. Dilated esophagus. The esophagus appears stable from prior. LYMPH NODES: No gross hilar adenopathy with limited evaluation on this noncontrast study. No mediastinal or axillary lymphadenopathy. LUNGS AND PLEURA: Diffuse bronchial wall thickening with mucous plugging of the left lower lobe. Associated slight mosaic attenuation of the left lower lobe and patchy airspace opacities at the left base (3:106). Stable bilateral calcified micronodules. No pulmonary edema. No pleural effusion or pneumothorax. SOFT TISSUES/BONES: Right chest wall port-a-cath. No acute abnormality of the bones or soft tissues. UPPER ABDOMEN: Partially visualized low-density lesion at the left kidney. Limited images of the upper abdomen demonstrates no acute abnormality. IMPRESSION: 1. Left lower lobe airspace disease consistent with an infectious or inflammatory process. 2. Dilated esophagus, which may predispose to aspiration. Electronically signed by: Morgane Naveau MD 02/08/2024 12:27 PM EST RP Workstation: [id number]   DG Chest Portable 1 View Result Date: 02/08/2024 CLINICAL DATA:  Chest pain EXAM: PORTABLE CHEST 1 VIEW COMPARISON:  December 21, 2023 FINDINGS: Stable cardiomediastinal silhouette. Right-sided pacemaker is unchanged. Right lung is clear. Minimal left basilar subsegmental atelectasis or infiltrate is noted. Bony thorax is unremarkable. IMPRESSION: Minimal left basilar subsegmental atelectasis or  infiltrate. Electronically Signed   By: Lynwood Landy Raddle M.D.   On: 02/08/2024 10:55    EKG: My personal interpretation of EKG shows: Atrial paced rhythm    Assessment/Plan Principal Problem:   CAP (community acquired pneumonia) Active Problems:   Developmental delay disorder   Essential hypertension   Seizure disorder (HCC)   MGUS (monoclonal gammopathy of unknown significance)   Renal mass   Overweight (BMI 25.0-29.9)    Assessment and Plan: 64 year old male with developmental delay, seizure disorder, SSS s/p PPM, renal mass ongoing workup and treatment, HTN, MGUS who was brought in from group home after he complained of chest pain.  1.  Left-sided pneumonia community-acquired versus aspiration - It appears that likely he has community-acquired pneumonia. - Due to his complex clinical condition, patient was started on vancomycin cefepime and azithromycin  in the  emergency room. - Will continue those until we have culture results back. - Continue to follow the cultures. - Continue oxygen to maintain saturation more than 90% - Patient has low temperature and requiring Bair hugger. - Continue Bair regular to maintain temperature per protocol.  2.  Developmental delay/seizure disorder - Resume his home medications - No seizure activities reported  3.  HTN/HLD - Clonidine /statin at home dose  4.  Renal mass/MGUS - Follow-up with oncologist as outpatient  5.  AKI - Hold off nephrotoxic drugs - Continue gentle hydration - Continue to monitor kidney function    DVT prophylaxis: Lovenox  Code Status: DNR/DNI(Do NOT Intubate) Family Communication: Patient's brother Marinda who is a power of attorney was present at the bedside and he confirmed that patient is DO NOT RESUSCITATE Disposition Plan: Back to group home Consults called: None Admission status: Observation, Telemetry bed   Nena Rebel, MD [facility] 02/08/2024, 1:44 PM        [1]   Allergies Allergen Reactions   Dilantin [Phenytoin]     Unknown reaction   Keppra [Levetiracetam]     Unknown reaction    Lisinopril     Unknown reaction    "

## 2024-02-08 NOTE — ED Notes (Signed)
 Pt placed on Bair Hugger at this time. Provider notified of pt's rectal temp.

## 2024-02-09 DIAGNOSIS — I10 Essential (primary) hypertension: Secondary | ICD-10-CM

## 2024-02-09 DIAGNOSIS — D696 Thrombocytopenia, unspecified: Secondary | ICD-10-CM

## 2024-02-09 DIAGNOSIS — N189 Chronic kidney disease, unspecified: Secondary | ICD-10-CM | POA: Diagnosis not present

## 2024-02-09 DIAGNOSIS — R6250 Unspecified lack of expected normal physiological development in childhood: Secondary | ICD-10-CM

## 2024-02-09 DIAGNOSIS — D472 Monoclonal gammopathy: Secondary | ICD-10-CM

## 2024-02-09 DIAGNOSIS — E663 Overweight: Secondary | ICD-10-CM

## 2024-02-09 DIAGNOSIS — D638 Anemia in other chronic diseases classified elsewhere: Secondary | ICD-10-CM

## 2024-02-09 DIAGNOSIS — G40909 Epilepsy, unspecified, not intractable, without status epilepticus: Secondary | ICD-10-CM

## 2024-02-09 DIAGNOSIS — N179 Acute kidney failure, unspecified: Secondary | ICD-10-CM

## 2024-02-09 DIAGNOSIS — N2889 Other specified disorders of kidney and ureter: Secondary | ICD-10-CM | POA: Diagnosis not present

## 2024-02-09 DIAGNOSIS — J189 Pneumonia, unspecified organism: Secondary | ICD-10-CM | POA: Diagnosis not present

## 2024-02-09 LAB — CBC
HCT: 25.3 % — ABNORMAL LOW (ref 39.0–52.0)
Hemoglobin: 8.2 g/dL — ABNORMAL LOW (ref 13.0–17.0)
MCH: 31.5 pg (ref 26.0–34.0)
MCHC: 32.4 g/dL (ref 30.0–36.0)
MCV: 97.3 fL (ref 80.0–100.0)
Platelets: 80 K/uL — ABNORMAL LOW (ref 150–400)
RBC: 2.6 MIL/uL — ABNORMAL LOW (ref 4.22–5.81)
RDW: 17.2 % — ABNORMAL HIGH (ref 11.5–15.5)
WBC: 4.2 K/uL (ref 4.0–10.5)
nRBC: 0 % (ref 0.0–0.2)

## 2024-02-09 LAB — PROTIME-INR
INR: 1.1 (ref 0.8–1.2)
Prothrombin Time: 15.2 s (ref 11.4–15.2)

## 2024-02-09 LAB — COMPREHENSIVE METABOLIC PANEL WITH GFR
ALT: 20 U/L (ref 0–44)
AST: 33 U/L (ref 15–41)
Albumin: 3.3 g/dL — ABNORMAL LOW (ref 3.5–5.0)
Alkaline Phosphatase: 113 U/L (ref 38–126)
Anion gap: 8 (ref 5–15)
BUN: 41 mg/dL — ABNORMAL HIGH (ref 8–23)
CO2: 22 mmol/L (ref 22–32)
Calcium: 8.8 mg/dL — ABNORMAL LOW (ref 8.9–10.3)
Chloride: 111 mmol/L (ref 98–111)
Creatinine, Ser: 1.78 mg/dL — ABNORMAL HIGH (ref 0.61–1.24)
GFR, Estimated: 43 mL/min — ABNORMAL LOW
Glucose, Bld: 70 mg/dL (ref 70–99)
Potassium: 4.7 mmol/L (ref 3.5–5.1)
Sodium: 141 mmol/L (ref 135–145)
Total Bilirubin: 0.3 mg/dL (ref 0.0–1.2)
Total Protein: 5.9 g/dL — ABNORMAL LOW (ref 6.5–8.1)

## 2024-02-09 MED ORDER — AMOXICILLIN-POT CLAVULANATE 875-125 MG PO TABS: 1.0000 | ORAL_TABLET | Freq: Two times a day (BID) | ORAL | 0 refills | Status: AC

## 2024-02-09 MED ORDER — AZITHROMYCIN 250 MG PO TABS
250.0000 mg | ORAL_TABLET | Freq: Every day | ORAL | Status: DC
  Administered 2024-02-09: 250 mg via ORAL
  Filled 2024-02-09: qty 1

## 2024-02-09 MED ORDER — AMOXICILLIN-POT CLAVULANATE 875-125 MG PO TABS
1.0000 | ORAL_TABLET | Freq: Two times a day (BID) | ORAL | Status: DC
  Administered 2024-02-09: 1 via ORAL
  Filled 2024-02-09: qty 1

## 2024-02-09 MED ORDER — AZITHROMYCIN 250 MG PO TABS: ORAL_TABLET | ORAL | 0 refills | Status: DC

## 2024-02-09 NOTE — Assessment & Plan Note (Signed)
 Follow up as outpatient

## 2024-02-09 NOTE — Assessment & Plan Note (Signed)
 Continue clonidine.

## 2024-02-09 NOTE — Assessment & Plan Note (Signed)
 Seems to be chronic for this patient.  Continue to monitor as outpatient.

## 2024-02-09 NOTE — Assessment & Plan Note (Signed)
 BMI 27.35 with current height and weight in computer.  Not sure how accurate this is

## 2024-02-09 NOTE — Discharge Summary (Signed)
 " Physician Discharge Summary   [name] MRN: [id number] DOB: 1960-09-24  Admit date:     02/08/2024  Discharge date: 02/09/2024  Discharge Physician: Charlie Patterson   PCP: Lizette Lango, MD   Recommendations at discharge:   Follow-up PCP 5 days  Discharge Diagnoses: Principal Problem:   CAP (community acquired pneumonia) Active Problems:   Acute kidney injury superimposed on CKD   Developmental delay disorder   Essential hypertension   Seizure disorder (HCC)   MGUS (monoclonal gammopathy of unknown significance)   Renal mass   Overweight (BMI 25.0-29.9)   Thrombocytopenia   Anemia of chronic disease  Resolved Problems:   * No resolved hospital problems. *  Hospital Course: 64 y.o. male with medical history significant for developmental delay, SSS s/p PPM not on anticoagulation, seizure disorder, renal mass on ongoing workup and treatment plan, HTN, depression, MGUS who was brought into [facility] emergency room for body aches and pain, chest discomfort.  Patient was at afternoon program while he was found to be fatigued than normal and complained of not feeling well and chest discomfort complaining of pains in other parts of the body as well.  Patient denies any fever, denies any nausea, vomiting, cough, congestion, dysuria, hematuria.  Caregiver confirmed that patient was well until this morning.   ED Course: Upon arrival to the ED, patient is found to be hypothermic at 93.8, creatinine at 2.02, normal it is 1.67, negative COVID RSV and flu, pending urine analysis.  Chest x-ray showed left-sided infiltrate.  Blood cultures were sent.  Patient was given vancomycin , cefepime  and azithromycin .  Hospitalist service was consulted for evaluation for admission  1/3.  Patient seen this morning and reevaluation in the late morning.  Patient's brother would like to get him back to the group home.  Will change antibiotics over to oral and complete course for pneumonia.  Switched antibiotics  over to Augmentin  and Zithromax .  Blood cultures negative for less than 12 hours.  Assessment and Plan: * CAP (community acquired pneumonia) Patient had low temperature upon coming into the hospital.  Found to have pneumonia left lower lobe.  Patient started on antibiotics.  Patient's brother would like to get patient out of the hospital today.  Will switch over to oral Augmentin  and Zithromax  to complete a course.  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  Baseline creatinine 1.71 with a GFR 45.  Creatinine on presentation 2.02.  Creatinine upon discharge 1.78.  Developmental delay disorder Continue all psychiatric medications  Essential hypertension Continue clonidine   Seizure disorder (HCC) Continue seizure medications  MGUS (monoclonal gammopathy of unknown significance) Follow-up as outpatient  Renal mass Follow-up oncology at [facility]  Overweight (BMI 25.0-29.9) BMI 27.35 with current height and weight in computer.  Not sure how accurate this is  Anemia of chronic disease Hemoglobin of 9.1 on presentation and was given vigorous IV fluids and hemoglobin 8.2 upon discharge.  Thrombocytopenia Seems to be chronic for this patient.  Continue to monitor as outpatient.         Consultants: none Procedures performed: none  Disposition: group home Diet recommendation:  Regular diet  DISCHARGE MEDICATION: Allergies as of 02/09/2024       Reactions   Dilantin [phenytoin]    Unknown reaction   Keppra [levetiracetam]    Unknown reaction   Lisinopril    Unknown reaction        Medication List     TAKE these medications  acetaminophen  325 MG tablet Commonly known as: TYLENOL  Take 650 mg by mouth every 6 (six) hours as needed for mild pain (pain score 1-3).   amoxicillin -clavulanate 875-125 MG tablet Commonly known as: AUGMENTIN  Take 1 tablet by mouth every 12 (twelve) hours for 4 days.   aspirin  EC 81 MG tablet TAKE 1  TABLET BY MOUTH ONCE DAILY SWALLOW WHOLE   azithromycin  250 MG tablet Commonly known as: ZITHROMAX  One tab po daily for three days Start taking on: February 10, 2024   clonazePAM  0.5 MG disintegrating tablet Commonly known as: KLONOPIN  Take 0.5 mg by mouth 3 (three) times daily.   clonazePAM  0.5 MG tablet Commonly known as: KLONOPIN  Take 0.5 mg by mouth 2 (two) times daily.   cloNIDine  0.2 mg/24hr patch Commonly known as: CATAPRES  - Dosed in mg/24 hr Place 0.2 mg onto the skin once a week. Thursday   cyanocobalamin  1000 MCG tablet Commonly known as: VITAMIN B12 Take 1,000 mcg by mouth daily.   dextromethorphan-guaiFENesin  30-600 MG 12hr tablet Commonly known as: MUCINEX  DM Take 1 tablet by mouth 2 (two) times daily as needed for cough.   ferrous sulfate  325 (65 FE) MG tablet Take 325 mg by mouth daily.   Fish Oil 1000 MG Caps Take 2,000 mg by mouth daily.   ibuprofen  200 MG tablet Commonly known as: ADVIL  Take 200 mg by mouth every 6 (six) hours as needed for fever.   ketoconazole 2 % cream Commonly known as: NIZORAL Apply 1 Application topically daily as needed for irritation.   lamoTRIgine  25 MG tablet Commonly known as: LAMICTAL  Take 50-75 mg by mouth See admin instructions. Take in addition to 100 mg tablet for a total morning dose of 150mg  and bedtime dose of 175mg    lamoTRIgine  100 MG tablet Commonly known as: LAMICTAL  Take 100 mg by mouth 2 (two) times daily.   loperamide 2 MG tablet Commonly known as: IMODIUM A-D Take 2 mg by mouth 4 (four) times daily as needed for diarrhea or loose stools.   loratadine  10 MG tablet Commonly known as: CLARITIN  Take 10 mg by mouth daily.   melatonin 3 MG Tabs tablet Take 3 mg by mouth every evening.   Mucinex  600 MG 12 hr tablet Generic drug: guaiFENesin  Take 1 tablet by mouth 2 (two) times daily as needed for cough.   polyethylene glycol 17 g packet Commonly known as: MIRALAX  / GLYCOLAX  Take 17 g by mouth in  the morning, at noon, and at bedtime.   PreserVision AREDS 2 Caps Take 1 capsule by mouth 2 (two) times daily.   QUEtiapine  100 MG tablet Commonly known as: SEROQUEL  Take 100-150 mg by mouth See admin instructions. Take 1 tablet by mouth twice a day then 1.5 tablets at bedtime   rosuvastatin  20 MG tablet Commonly known as: CRESTOR  Take 1 tablet (20 mg total) by mouth daily.   senna 8.6 MG Tabs tablet Commonly known as: SENOKOT Take 2 tablets by mouth at bedtime. What changed:  when to take this reasons to take this        Follow-up Information     Lizette Lango, MD Follow up in 5 day(s).   Specialty: Family Medicine Contact information: [id number] [facility] [address] [phone]                Discharge Exam: Fredricka Weights   02/08/24 0956  Weight: 81.6 kg   Physical Exam HENT:     Head: Normocephalic.  Eyes:     General: Lids are normal.  Cardiovascular:     Rate and Rhythm: Normal rate and regular rhythm.     Heart sounds: Normal heart sounds, S1 normal and S2 normal.  Pulmonary:     Breath sounds: Examination of the right-lower field reveals decreased breath sounds. Examination of the left-lower field reveals decreased breath sounds. Decreased breath sounds present. No wheezing, rhonchi or rales.  Abdominal:     Palpations: Abdomen is soft.     Tenderness: There is no abdominal tenderness.  Musculoskeletal:     Right lower leg: Swelling present.     Left lower leg: Swelling present.  Skin:    General: Skin is warm.     Findings: No rash.  Neurological:     Mental Status: He is alert.      Condition at discharge: fair  The results of significant diagnostics from this hospitalization (including imaging, microbiology, ancillary and laboratory) are listed below for reference.   Imaging Studies: CT CHEST WO CONTRAST Result Date: 02/08/2024 EXAM: CT CHEST WITHOUT CONTRAST 02/08/2024 11:32:49 AM TECHNIQUE: CT  of the chest was performed without the administration of intravenous contrast. Multiplanar reformatted images are provided for review. Automated exposure control, iterative reconstruction, and/or weight based adjustment of the mA/kV was utilized to reduce the radiation dose to as low as reasonably achievable. COMPARISON: None available. CLINICAL HISTORY: Pneumonia, complication suspected, xray done. FINDINGS: MEDIASTINUM: Cardiomegaly. Trace layering pericardial effusion. 3-vessel coronary artery calcifications. Cardiac changes suggestive of anemia. The central airways are clear. Dilated esophagus. The esophagus appears stable from prior. LYMPH NODES: No gross hilar adenopathy with limited evaluation on this noncontrast study. No mediastinal or axillary lymphadenopathy. LUNGS AND PLEURA: Diffuse bronchial wall thickening with mucous plugging of the left lower lobe. Associated slight mosaic attenuation of the left lower lobe and patchy airspace opacities at the left base (3:106). Stable bilateral calcified micronodules. No pulmonary edema. No pleural effusion or pneumothorax. SOFT TISSUES/BONES: Right chest wall port-a-cath. No acute abnormality of the bones or soft tissues. UPPER ABDOMEN: Partially visualized low-density lesion at the left kidney. Limited images of the upper abdomen demonstrates no acute abnormality. IMPRESSION: 1. Left lower lobe airspace disease consistent with an infectious or inflammatory process. 2. Dilated esophagus, which may predispose to aspiration. Electronically signed by: Morgane Naveau MD 02/08/2024 12:27 PM EST RP Workstation: [id number]   DG Chest Portable 1 View Result Date: 02/08/2024 CLINICAL DATA:  Chest pain EXAM: PORTABLE CHEST 1 VIEW COMPARISON:  December 21, 2023 FINDINGS: Stable cardiomediastinal silhouette. Right-sided pacemaker is unchanged. Right lung is clear. Minimal left basilar subsegmental atelectasis or infiltrate is noted. Bony thorax is unremarkable. IMPRESSION:  Minimal left basilar subsegmental atelectasis or infiltrate. Electronically Signed   By: Lynwood Landy Raddle M.D.   On: 02/08/2024 10:55   CT HEAD WO CONTRAST ( ) Result Date: 01/15/2024 EXAM: CT HEAD WITHOUT CONTRAST 01/11/2024 12:51:09 PM TECHNIQUE: CT of the head was performed without the administration of intravenous contrast. Automated exposure control, iterative reconstruction, and/or weight based adjustment of the mA/kV was utilized to reduce the radiation dose to as low as reasonably achievable. COMPARISON: 12/21/2023 CLINICAL HISTORY: Subdural hemorrhage FINDINGS: BRAIN AND VENTRICLES: The previously seen small right parafalcine subdural hematoma has resolved. No new hemorrhage or acute infarction. No hydrocephalus. No extra-axial collection. No mass effect or midline shift. ORBITS: No acute abnormality. SINUSES: No acute abnormality. SOFT TISSUES AND SKULL: No acute soft tissue abnormality. No skull fracture. IMPRESSION: 1. No acute intracranial abnormality. 2. Previously  seen small right parafalcine subdural hematoma has resolved. Electronically signed by: Franky Crease MD 01/15/2024 09:16 PM EST RP Workstation: [id number]   DG Knee Complete 4 Views Right Result Date: 01/15/2024 EXAM: 4 VIEW(S) XRAY OF THE RIGHT KNEE 01/15/2024 09:11:00 PM COMPARISON: None available. CLINICAL HISTORY: fall fall FINDINGS: BONES AND JOINTS: No acute fracture. No malalignment. No significant joint effusion. SOFT TISSUES: The soft tissues are unremarkable. IMPRESSION: 1. No significant abnormality. Electronically signed by: Franky Crease MD 01/15/2024 09:15 PM EST RP Workstation: [id number]   DG Knee Complete 4 Views Left Result Date: 01/15/2024 EXAM: 4 OR MORE VIEW(S) Xray of the left knee 01/15/2024 09:11:00 PM COMPARISON: None available. CLINICAL HISTORY: fall fall FINDINGS: BONES AND JOINTS: No acute fracture. No malalignment. No significant joint effusion. SOFT TISSUES: The soft tissues are unremarkable. IMPRESSION:  1. No significant abnormality. Electronically signed by: Franky Crease MD 01/15/2024 09:14 PM EST RP Workstation: [id number]    Microbiology: Results for orders placed or performed during the hospital encounter of 02/08/24  Resp panel by RT-PCR (RSV, Flu A&B, Covid) Anterior Nasal Swab     Status: None   Collection Time: 02/08/24 10:14 AM   Specimen: Anterior Nasal Swab  Result Value Ref Range Status   SARS Coronavirus 2 by RT PCR NEGATIVE NEGATIVE Final    Comment: (NOTE) SARS-CoV-2 target nucleic acids are NOT DETECTED.  The SARS-CoV-2 RNA is generally detectable in upper respiratory specimens during the acute phase of infection. The lowest concentration of SARS-CoV-2 viral copies this assay can detect is 138 copies/mL. A negative result does not preclude SARS-Cov-2 infection and should not be used as the sole basis for treatment or other patient management decisions. A negative result may occur with  improper specimen collection/handling, submission of specimen other than nasopharyngeal swab, presence of viral mutation(s) within the areas targeted by this assay, and inadequate number of viral copies(<138 copies/mL). A negative result must be combined with clinical observations, patient history, and epidemiological information. The expected result is Negative.  Fact Sheet for Patients:  bloggercourse.com  Fact Sheet for Healthcare Providers:  seriousbroker.it  This test is no t yet approved or cleared by the [place]  FDA and  has been authorized for detection and/or diagnosis of SARS-CoV-2 by FDA under an Emergency Use Authorization (EUA). This EUA will remain  in effect (meaning this test can be used) for the duration of the COVID-19 declaration under Section 564(b)(1) of the Act, 21 U.S.C.section 360bbb-3(b)(1), unless the authorization is terminated  or revoked sooner.       Influenza A by PCR NEGATIVE NEGATIVE Final    Influenza B by PCR NEGATIVE NEGATIVE Final    Comment: (NOTE) The Xpert Xpress SARS-CoV-2/FLU/RSV plus assay is intended as an aid in the diagnosis of influenza from Nasopharyngeal swab specimens and should not be used as a sole basis for treatment. Nasal washings and aspirates are unacceptable for Xpert Xpress SARS-CoV-2/FLU/RSV testing.  Fact Sheet for Patients: bloggercourse.com  Fact Sheet for Healthcare Providers: seriousbroker.it  This test is not yet approved or cleared by the [place]  FDA and has been authorized for detection and/or diagnosis of SARS-CoV-2 by FDA under an Emergency Use Authorization (EUA). This EUA will remain in effect (meaning this test can be used) for the duration of the COVID-19 declaration under Section 564(b)(1) of the Act, 21 U.S.C. section 360bbb-3(b)(1), unless the authorization is terminated or revoked.     Resp Syncytial Virus by PCR NEGATIVE NEGATIVE Final    Comment: (NOTE) Fact Sheet  for Patients: bloggercourse.com  Fact Sheet for Healthcare Providers: seriousbroker.it  This test is not yet approved or cleared by the [place]  FDA and has been authorized for detection and/or diagnosis of SARS-CoV-2 by FDA under an Emergency Use Authorization (EUA). This EUA will remain in effect (meaning this test can be used) for the duration of the COVID-19 declaration under Section 564(b)(1) of the Act, 21 U.S.C. section 360bbb-3(b)(1), unless the authorization is terminated or revoked.  Performed at [facility], [address] [place], [place] [address]   MRSA Next Gen by PCR, Nasal     Status: None   Collection Time: 02/08/24  1:09 PM   Specimen: Nasal Mucosa; Nasal Swab  Result Value Ref Range Status   MRSA by PCR Next Gen NOT DETECTED NOT DETECTED Final    Comment: (NOTE) The GeneXpert MRSA Assay (FDA approved for NASAL  specimens only), is one component of a comprehensive MRSA colonization surveillance program. It is not intended to diagnose MRSA infection nor to guide or monitor treatment for MRSA infections. Test performance is not FDA approved in patients less than 91 years old. Performed at [facility], [address] [place], [place] [address]   Blood Culture (routine x 2)     Status: None (Preliminary result)   Collection Time: 02/08/24  1:18 PM   Specimen: BLOOD  Result Value Ref Range Status   Specimen Description BLOOD BLOOD RIGHT WRIST  Final   Special Requests   Final    BOTTLES DRAWN AEROBIC AND ANAEROBIC BLOOD RIGHT WRIST   Culture   Final    NO GROWTH < 24 HOURS Performed at [facility], [address] [place], [place] [address]    Report Status PENDING  Incomplete  Blood Culture (routine x 2)     Status: None (Preliminary result)   Collection Time: 02/08/24  1:37 PM   Specimen: BLOOD  Result Value Ref Range Status   Specimen Description BLOOD BLOOD LEFT ARM  Final   Special Requests   Final    BOTTLES DRAWN AEROBIC AND ANAEROBIC Blood Culture adequate volume   Culture   Final    NO GROWTH < 24 HOURS Performed at [facility], [address] [place], [place] [address]    Report Status PENDING  Incomplete    Labs: CBC: Recent Labs  Lab 02/08/24 0956 02/09/24 0431  WBC 4.7 4.2  HGB 9.1* 8.2*  HCT 28.2* 25.3*  MCV 97.6 97.3  PLT 85* 80*   Basic Metabolic Panel: Recent Labs  Lab 02/08/24 0956 02/09/24 0431  NA 142 141  K 4.8 4.7  CL 106 111  CO2 25 22  GLUCOSE 56* 70  BUN 38* 41*  CREATININE 2.02* 1.78*  CALCIUM  9.4 8.8*   Liver Function Tests: Recent Labs  Lab 02/08/24 0956 02/09/24 0431  AST 42* 33  ALT 23 20  ALKPHOS 125 113  BILITOT 0.3 0.3  PROT 6.6 5.9*  ALBUMIN 3.8 3.3*   CBG: No results for input(s): GLUCAP in the last 168 hours.  Discharge time spent: greater than 30 minutes.  Signed: Charlie Patterson, MD [facility] 02/09/2024 "

## 2024-02-09 NOTE — Progress Notes (Signed)
 Creed Conquest at bedside notified to DC order, MD at bedside had answered all questions

## 2024-02-09 NOTE — Plan of Care (Signed)

## 2024-02-09 NOTE — TOC Transition Note (Addendum)
 Transition of Care [facility]) - Discharge Note   Patient Details  Name: Frank Conley MRN: [id number] Date of Birth: 09-30-1960  [facility] CM/SW Contact:  Frank Torpey L Camiyah Friberg, LCSW Phone Number: 02/09/2024, 11:44 AM   Clinical Narrative:     Patient medially stable for discharge. Discharge Summary is in. Frank Conley will set up transportation. Maudie Edison at [employer] notified and agreeable to discharge. FL2 and DC summary was provided and emailed to Frank and patient brother, Frank Conley.   No further TOC needs identified. TOC signing off.   12:41pm: Code 44 issued but patient discharged before CSW was able to issue to patient.        Patient Goals and CMS Choice            Discharge Placement                       Discharge Plan and Services Additional resources added to the After Visit Summary for                                       Social Drivers of Health (SDOH) Interventions SDOH Screenings   Food Insecurity: Patient Unable To Answer (02/08/2024)  Housing: Low Risk (02/08/2024)  Transportation Needs: No Transportation Needs (02/08/2024)  Utilities: Patient Unable To Answer (02/08/2024)  Financial Resource Strain: Low Risk (01/17/2024)   Received from [facility]  Tobacco Use: Low Risk (01/16/2024)     Readmission Risk Interventions     No data to display

## 2024-02-09 NOTE — Discharge Instructions (Signed)
 Frank Conley

## 2024-02-09 NOTE — Assessment & Plan Note (Signed)
-   Continue seizure medications

## 2024-02-09 NOTE — Care Plan (Signed)
 IV removed.  Patient's brother (legal guardian) at bedside, dc instruction given, all question answered No other concerns addressed Patient wheeled to the main entrance to be picked up by group home van.

## 2024-02-09 NOTE — Assessment & Plan Note (Signed)
 Hemoglobin of 9.1 on presentation and was given vigorous IV fluids and hemoglobin 8.2 upon discharge.

## 2024-02-09 NOTE — Assessment & Plan Note (Signed)
 Acute kidney injury on CKD stage 3B.  Baseline creatinine 1.71 with a GFR 45.  Creatinine on presentation 2.02.  Creatinine upon discharge 1.78.

## 2024-02-09 NOTE — Progress Notes (Signed)
 Called the pharmacy did not fill the second prescription.  I called the pharmacy and confirmed that they did have the Zithromax  prescription already over there but I ordered for it to be started for tomorrow so they did not fill it today.  They will fill it today and send it over.  Dr. Charlie Patterson

## 2024-02-09 NOTE — Plan of Care (Signed)

## 2024-02-09 NOTE — [place] FL2 (Signed)
 " [place]  MEDICAID [id number] LEVEL OF CARE FORM     IDENTIFICATION  [name] Birthdate: 30-Oct-1960 Sex: male Admission Date (Current Location): 02/08/2024  [facility] and [place] Number:  Chiropodist and Address:  [facility], [address], [place], [place] [address]      Provider Number: [id number]  Attending Physician Name and Address:  Josette Ade, MD  Relative Name and Phone Number:  Brother, Emergency Contact  [phone] (Mobile)    Current Level of Care: Hospital Recommended Level of Care: [facility] Prior Approval Number:    Date Approved/Denied:   PASRR Number:    Discharge Plan: Other (Comment) [facility])    Current Diagnoses: Patient Active Problem List   Diagnosis Date Noted   Anemia of chronic disease 02/09/2024   Subdural hematoma (HCC) 01/07/2024   CAP (community acquired pneumonia) 11/22/2023   Internal carotid artery stenosis 11/22/2023   Chronic diastolic CHF (congestive heart failure) (HCC) 11/22/2023   Chronic kidney disease, stage 3a (HCC) 11/22/2023   Overweight (BMI 25.0-29.9) 11/22/2023   Fall at home, initial encounter 11/22/2023   Renal mass 11/22/2023   TIA (transient ischemic attack) 11/22/2023   Positive D dimer 11/22/2023   OCD (obsessive compulsive disorder) 11/22/2023   Depression 11/22/2023   Vertebral artery stenosis 12/18/2022   Left-sided weakness 12/17/2022   MGUS (monoclonal gammopathy of unknown significance) 07/07/2021   Multifocal pneumonia 12/20/2018   Seizure disorder (HCC) 12/20/2018   COVID-19 virus infection 12/20/2018   Acute respiratory failure with hypoxia (HCC) 12/20/2018   Essential hypertension 12/20/2018   Acute kidney injury superimposed on CKD 12/20/2018   Hyponatremia 12/20/2018   Pancytopenia (HCC) 12/20/2018   Developmental delay disorder 12/20/2018   Pneumonia due to COVID-19 virus 12/20/2018   CKD (chronic kidney disease) stage 3, GFR 30-59  ml/min (HCC) 12/20/2018   Age-related nuclear cataract of both eyes 07/18/2017   Early dry stage nonexudative age-related macular degeneration of both eyes 07/18/2017   Myopia with astigmatism and presbyopia, bilateral 07/18/2017   Onychomycosis 06/29/2016   Risk for falls 02/10/2015   Pacemaker 11/06/2014   Septic shock (HCC) 11/06/2014   Dysarthria 10/31/2014   Altered mental status 10/30/2014   Thrombocytopenia 10/08/2014   Medication monitoring encounter 04/24/2014   Tremor 04/24/2014   Unsteadiness on feet 04/24/2014   History of nonmelanoma skin cancer 07/22/2013   Colon polyps 07/09/2013   Enuresis, primary, functional 05/09/2013   Gait disturbance 04/17/2013   Hemorrhoid 04/17/2013   Intention tremor 04/17/2013   Actinic keratosis 05/27/2010   Major depressive disorder, single episode, severe, with psychotic behavior (HCC) 03/21/2010   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   Pervasive developmental disorder 03/21/2010   Obsessive-compulsive disorder 03/07/2010   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   Obesity due to excess calories 02/25/2010    Orientation RESPIRATION BLADDER Height & Weight     Self, Place  Normal External catheter Weight: 179 lb 14.3 oz (81.6 kg) Height:  5' 8 (172.7 cm)  BEHAVIORAL SYMPTOMS/MOOD NEUROLOGICAL BOWEL NUTRITION STATUS     (IDD) Continent Diet (Diet regular Room service appropriate? Yes; Fluid consistency: Thin: General starting at 01/02 1310)  AMBULATORY STATUS COMMUNICATION OF NEEDS Skin     Verbally Normal                       Personal Care Assistance Level of Assistance              Functional Limitations Info  SPECIAL CARE FACTORS FREQUENCY                       Contractures Contractures Info: Not present    Additional Factors Info  Code Status, Allergies Code Status Info: DNR Allergies Info: Dilantin, Keppra, Lisinopril           Current Medications (02/09/2024):  This is the  current hospital active medication list Current Facility-Administered Medications  Medication Dose Route Frequency Provider Last Rate Last Admin   acetaminophen  (TYLENOL ) tablet 650 mg  650 mg Oral Q6H PRN Roann Gouty, MD       Or   acetaminophen  (TYLENOL ) suppository 650 mg  650 mg Rectal Q6H PRN Paudel, Keshab, MD       amoxicillin -clavulanate (AUGMENTIN ) 875-125 MG per tablet 1 tablet  1 tablet Oral Q12H Wieting, Richard, MD       aspirin  EC tablet 81 mg  81 mg Oral Daily Paudel, Keshab, MD   81 mg at 02/09/24 9096   azithromycin  (ZITHROMAX ) tablet 250 mg  250 mg Oral Daily Josette Ade, MD       clonazePAM  (KLONOPIN ) tablet 0.5 mg  0.5 mg Oral TID Paudel, Keshab, MD   0.5 mg at 02/09/24 0903   [START ON 02/14/2024] cloNIDine  (CATAPRES  - Dosed in mg/24 hr) patch 0.2 mg  0.2 mg Transdermal Weekly Paudel, Gouty, MD       dextromethorphan-guaiFENesin  (MUCINEX  DM) 30-600 MG per 12 hr tablet 1 tablet  1 tablet Oral BID PRN Paudel, Keshab, MD       enoxaparin  (LOVENOX ) injection 40 mg  40 mg Subcutaneous QHS Paudel, Gouty, MD   40 mg at 02/08/24 2133   ferrous sulfate  tablet 325 mg  325 mg Oral Daily Paudel, Keshab, MD   325 mg at 02/09/24 9096   lamoTRIgine  (LAMICTAL ) tablet 100 mg  100 mg Oral BID Paudel, Keshab, MD   100 mg at 02/09/24 9096   melatonin tablet 2.5 mg  2.5 mg Oral QPM Paudel, Gouty, MD   2.5 mg at 02/08/24 2131   ondansetron  (ZOFRAN ) tablet 4 mg  4 mg Oral Q6H PRN Paudel, Keshab, MD       Or   ondansetron  (ZOFRAN ) injection 4 mg  4 mg Intravenous Q6H PRN Roann Gouty, MD       oxyCODONE  (Oxy IR/ROXICODONE ) immediate release tablet 5 mg  5 mg Oral Q4H PRN Paudel, Keshab, MD       polyethylene glycol (MIRALAX  / GLYCOLAX ) packet 17 g  17 g Oral Daily PRN Paudel, Keshab, MD       QUEtiapine  (SEROQUEL ) tablet 100 mg  100 mg Oral BID WC Nazari, Walid A, RPH   100 mg at 02/09/24 9096   And   QUEtiapine  (SEROQUEL ) tablet 150 mg  150 mg Oral QHS Nazari, Walid A, RPH   150 mg at  02/08/24 2131   rosuvastatin  (CRESTOR ) tablet 20 mg  20 mg Oral QHS Paudel, Gouty, MD   20 mg at 02/08/24 2131   senna (SENOKOT) tablet 17.2 mg  2 tablet Oral QHS Paudel, Keshab, MD   17.2 mg at 02/08/24 2131     Discharge Medications: Please see discharge summary for a list of discharge medications.  Relevant Imaging Results:  Relevant Lab Results:   Additional Information [id number]  Kelis Plasse L Trula Frede, LCSW     "

## 2024-02-09 NOTE — Assessment & Plan Note (Signed)
Continue all psychiatric medications

## 2024-02-09 NOTE — Hospital Course (Signed)
 64 y.o. male with medical history significant for developmental delay, SSS s/p PPM not on anticoagulation, seizure disorder, renal mass on ongoing workup and treatment plan, HTN, depression, MGUS who was brought into [facility] emergency room for body aches and pain, chest discomfort.  Patient was at afternoon program while he was found to be fatigued than normal and complained of not feeling well and chest discomfort complaining of pains in other parts of the body as well.  Patient denies any fever, denies any nausea, vomiting, cough, congestion, dysuria, hematuria.  Caregiver confirmed that patient was well until this morning.   ED Course: Upon arrival to the ED, patient is found to be hypothermic at 93.8, creatinine at 2.02, normal it is 1.67, negative COVID RSV and flu, pending urine analysis.  Chest x-ray showed left-sided infiltrate.  Blood cultures were sent.  Patient was given vancomycin , cefepime  and azithromycin .  Hospitalist service was consulted for evaluation for admission  1/3.  Patient seen this morning and reevaluation in the late morning.  Patient's brother would like to get him back to the group home.  Will change antibiotics over to oral and complete course for pneumonia.  Switched antibiotics over to Augmentin  and Zithromax .  Blood cultures negative for less than 12 hours.

## 2024-02-09 NOTE — Assessment & Plan Note (Signed)
 Follow-up oncology at [facility]

## 2024-02-09 NOTE — Assessment & Plan Note (Signed)
 Patient had low temperature upon coming into the hospital.  Found to have pneumonia left lower lobe.  Patient started on antibiotics.  Patient's brother would like to get patient out of the hospital today.  Will switch over to oral Augmentin  and Zithromax  to complete a course.

## 2024-02-13 LAB — CULTURE, BLOOD (ROUTINE X 2)
Culture: NO GROWTH
Culture: NO GROWTH
Special Requests: ADEQUATE

## 2024-03-01 ENCOUNTER — Emergency Department

## 2024-03-01 ENCOUNTER — Encounter: Payer: Self-pay | Admitting: Emergency Medicine

## 2024-03-01 ENCOUNTER — Encounter (HOSPITAL_COMMUNITY): Payer: Self-pay

## 2024-03-01 ENCOUNTER — Inpatient Hospital Stay: Admit: 2024-03-01 | Admitting: Pulmonary Disease

## 2024-03-01 ENCOUNTER — Emergency Department: Admission: EM | Admit: 2024-03-01 | Discharge: 2024-03-02 | Disposition: A

## 2024-03-01 ENCOUNTER — Other Ambulatory Visit: Payer: Self-pay

## 2024-03-01 DIAGNOSIS — W19XXXA Unspecified fall, initial encounter: Secondary | ICD-10-CM | POA: Diagnosis not present

## 2024-03-01 DIAGNOSIS — S0990XA Unspecified injury of head, initial encounter: Secondary | ICD-10-CM | POA: Diagnosis present

## 2024-03-01 DIAGNOSIS — I10 Essential (primary) hypertension: Secondary | ICD-10-CM | POA: Insufficient documentation

## 2024-03-01 DIAGNOSIS — S0101XA Laceration without foreign body of scalp, initial encounter: Secondary | ICD-10-CM | POA: Insufficient documentation

## 2024-03-01 DIAGNOSIS — S06360A Traumatic hemorrhage of cerebrum, unspecified, without loss of consciousness, initial encounter: Secondary | ICD-10-CM | POA: Insufficient documentation

## 2024-03-01 DIAGNOSIS — Z23 Encounter for immunization: Secondary | ICD-10-CM | POA: Insufficient documentation

## 2024-03-01 DIAGNOSIS — T68XXXA Hypothermia, initial encounter: Secondary | ICD-10-CM | POA: Diagnosis not present

## 2024-03-01 DIAGNOSIS — Y92009 Unspecified place in unspecified non-institutional (private) residence as the place of occurrence of the external cause: Secondary | ICD-10-CM | POA: Insufficient documentation

## 2024-03-01 DIAGNOSIS — I629 Nontraumatic intracranial hemorrhage, unspecified: Secondary | ICD-10-CM

## 2024-03-01 DIAGNOSIS — X31XXXA Exposure to excessive natural cold, initial encounter: Secondary | ICD-10-CM | POA: Diagnosis not present

## 2024-03-01 LAB — CBC WITH DIFFERENTIAL/PLATELET
Abs Immature Granulocytes: 0.04 10*3/uL (ref 0.00–0.07)
Basophils Absolute: 0 10*3/uL (ref 0.0–0.1)
Basophils Relative: 0 %
Eosinophils Absolute: 0 10*3/uL (ref 0.0–0.5)
Eosinophils Relative: 1 %
HCT: 26.6 % — ABNORMAL LOW (ref 39.0–52.0)
Hemoglobin: 8.4 g/dL — ABNORMAL LOW (ref 13.0–17.0)
Immature Granulocytes: 1 %
Lymphocytes Relative: 10 %
Lymphs Abs: 0.5 10*3/uL — ABNORMAL LOW (ref 0.7–4.0)
MCH: 31.9 pg (ref 26.0–34.0)
MCHC: 31.6 g/dL (ref 30.0–36.0)
MCV: 101.1 fL — ABNORMAL HIGH (ref 80.0–100.0)
Monocytes Absolute: 0.3 10*3/uL (ref 0.1–1.0)
Monocytes Relative: 7 %
Neutro Abs: 3.9 10*3/uL (ref 1.7–7.7)
Neutrophils Relative %: 81 %
Platelets: 55 10*3/uL — ABNORMAL LOW (ref 150–400)
RBC: 2.63 MIL/uL — ABNORMAL LOW (ref 4.22–5.81)
RDW: 17.8 % — ABNORMAL HIGH (ref 11.5–15.5)
WBC: 4.9 10*3/uL (ref 4.0–10.5)
nRBC: 0 % (ref 0.0–0.2)

## 2024-03-01 LAB — APTT: aPTT: 41 s — ABNORMAL HIGH (ref 24–36)

## 2024-03-01 LAB — BASIC METABOLIC PANEL WITH GFR
Anion gap: 10 (ref 5–15)
BUN: 56 mg/dL — ABNORMAL HIGH (ref 8–23)
CO2: 21 mmol/L — ABNORMAL LOW (ref 22–32)
Calcium: 8.4 mg/dL — ABNORMAL LOW (ref 8.9–10.3)
Chloride: 108 mmol/L (ref 98–111)
Creatinine, Ser: 2.28 mg/dL — ABNORMAL HIGH (ref 0.61–1.24)
GFR, Estimated: 32 mL/min — ABNORMAL LOW
Glucose, Bld: 112 mg/dL — ABNORMAL HIGH (ref 70–99)
Potassium: 5.8 mmol/L — ABNORMAL HIGH (ref 3.5–5.1)
Sodium: 139 mmol/L (ref 135–145)

## 2024-03-01 LAB — HEPATIC FUNCTION PANEL
ALT: 32 U/L (ref 0–44)
AST: 52 U/L — ABNORMAL HIGH (ref 15–41)
Albumin: 3.4 g/dL — ABNORMAL LOW (ref 3.5–5.0)
Alkaline Phosphatase: 128 U/L — ABNORMAL HIGH (ref 38–126)
Bilirubin, Direct: <0.1 mg/dL (ref 0.0–0.2)
Total Bilirubin: 0.3 mg/dL (ref 0.0–1.2)
Total Protein: 6.1 g/dL — ABNORMAL LOW (ref 6.5–8.1)

## 2024-03-01 LAB — TYPE AND SCREEN
ABO/RH(D): B NEG
Antibody Screen: NEGATIVE

## 2024-03-01 LAB — PROTIME-INR
INR: 1.1 (ref 0.8–1.2)
Prothrombin Time: 14.6 s (ref 11.4–15.2)

## 2024-03-01 LAB — LIPASE, BLOOD: Lipase: 23 U/L (ref 11–51)

## 2024-03-01 LAB — LACTIC ACID, PLASMA: Lactic Acid, Venous: 0.9 mmol/L (ref 0.5–1.9)

## 2024-03-01 MED ORDER — LAMOTRIGINE 25 MG PO TABS: 50.0000 mg | ORAL_TABLET | Freq: Every day | ORAL | Status: DC

## 2024-03-01 MED ORDER — LAMOTRIGINE 25 MG PO TABS
75.0000 mg | ORAL_TABLET | Freq: Every day | ORAL | Status: DC
  Administered 2024-03-01: 75 mg via ORAL
  Filled 2024-03-01: qty 3

## 2024-03-01 MED ORDER — LAMOTRIGINE 25 MG PO TABS: 50.0000 mg | ORAL_TABLET | ORAL | Status: DC

## 2024-03-01 MED ORDER — LEVETIRACETAM (KEPPRA) 500 MG/5 ML ADULT IV PUSH
1500.0000 mg | Freq: Once | INTRAVENOUS | Status: AC
  Administered 2024-03-01: 1500 mg via INTRAVENOUS
  Filled 2024-03-01: qty 15

## 2024-03-01 MED ORDER — SODIUM CHLORIDE 0.9 % IV BOLUS (SEPSIS)
1000.0000 mL | Freq: Once | INTRAVENOUS | Status: AC
  Administered 2024-03-01: 1000 mL via INTRAVENOUS

## 2024-03-01 MED ORDER — LAMOTRIGINE 100 MG PO TABS
100.0000 mg | ORAL_TABLET | Freq: Two times a day (BID) | ORAL | Status: DC
  Administered 2024-03-01: 100 mg via ORAL
  Filled 2024-03-01: qty 1

## 2024-03-01 MED ORDER — CLONAZEPAM 0.125 MG PO TBDP
0.5000 mg | ORAL_TABLET | Freq: Three times a day (TID) | ORAL | Status: DC
  Administered 2024-03-01: 0.5 mg via ORAL
  Filled 2024-03-01: qty 4

## 2024-03-01 MED ORDER — LIDOCAINE-EPINEPHRINE 2 %-1:100000 IJ SOLN
20.0000 mL | Freq: Once | INTRAMUSCULAR | Status: AC
  Administered 2024-03-01: 20 mL

## 2024-03-01 MED ORDER — TETANUS-DIPHTH-ACELL PERTUSSIS 5-2-15.5 LF-MCG/0.5 IM SUSP
0.5000 mL | Freq: Once | INTRAMUSCULAR | Status: AC
  Administered 2024-03-01: 0.5 mL via INTRAMUSCULAR
  Filled 2024-03-01: qty 0.5

## 2024-03-01 MED ORDER — LEVETIRACETAM (KEPPRA) 500 MG/5 ML ADULT IV PUSH: 1000.0000 mg | Freq: Two times a day (BID) | INTRAVENOUS | Status: DC

## 2024-03-01 NOTE — ED Notes (Addendum)
 RN talked to pt's brother, who is pt's emergency contact, and updated brother on current plan of care.   Pt's brother and caregiver from group home stated that pt is at his baseline. PT is A&O x 3.

## 2024-03-01 NOTE — ED Triage Notes (Signed)
 Presents via EMS  s/p fall  Per staff it was unwitnesed fall  Laceration to back of head Denies any other sxs'

## 2024-03-01 NOTE — ED Notes (Signed)
 Report given to Woodfin Amble.

## 2024-03-01 NOTE — ED Provider Notes (Signed)
 ----------------------------------------- 11:07 PM on 03/01/2024 -----------------------------------------  Blood pressure 137/71, pulse 60, temperature (!) 91.4 F (33 C), resp. rate 17, height 1.727 m (5' 8), weight 81.6 kg, SpO2 99%.   Assuming care from Dr. Jossie.  In short, Frank Conley is a 64 y.o. male with a chief complaint of seizure and head bleed.  Refer to the original H&P for additional details.  The current plan of care is to transfer to [facility].   Clinical Course as of 03/02/24 0028  Sat Mar 01, 2024  8186 I was notified by radiology that patient has a significant head  [HD]  1813 Temp(!): 97 F (36.1 C) [HD]  2332 Patient increasingly somnolent but still will answer questions.  Also increasingly hypothermic, some variability in blood pressure.  Given the patient's critical status and concern of decompensation as well as the need for continuous EEG monitoring, I contacted Dr. Alm Lias in the [facility] emergency department.  He agreed to ED to ED transfer.  I have expressed my concerns to [facility] about the patient's status and asked that he be made a priority before additional winter weather arrive [CF]  Sun Mar 02, 2024  0027 I assessed the patient prior to his departure and worked with the [facility] staff at bedside to transfer him to their stretcher and get him set up for transport.  The patient is awake and alert and answering questions but does so slowly with garbled speech.  He was noted to have a DNR form at his bedside when we are collecting his materials for transport.  Although he is critically ill he is stable and appropriate for transfer to a higher level of care as per the plan [CF]    Clinical Course User Index [CF] Gordan Huxley, MD [HD] Nicholaus Rolland BRAVO, MD     Medications  clonazepam  (KLONOPIN ) disintegrating tablet 0.5 mg (0.5 mg Oral Given 03/01/24 2335)  lamoTRIgine  (LAMICTAL ) tablet 100 mg (100 mg Oral Given 03/01/24 2335)  levETIRAcetam   (KEPPRA ) undiluted injection 1,000 mg (has no administration in time range)  lamoTRIgine  (LAMICTAL ) tablet 50 mg (has no administration in time range)    And  lamoTRIgine  (LAMICTAL ) tablet 75 mg (75 mg Oral Given 03/01/24 2335)  lidocaine -EPINEPHrine  (XYLOCAINE  W/EPI) 2 %-1:100000 (with pres) injection 20 mL (20 mLs Infiltration Given by Other 03/01/24 1754)  Tdap (ADACEL ) injection 0.5 mL (0.5 mLs Intramuscular Given 03/01/24 1838)  levETIRAcetam  (KEPPRA ) undiluted injection 1,500 mg (1,500 mg Intravenous Given 03/01/24 2217)  sodium chloride  0.9 % bolus 1,000 mL (0 mLs Intravenous Stopped 03/02/24 0004)   .Critical Care  Performed by: Gordan Huxley, MD Authorized by: Gordan Huxley, MD   Critical care provider statement:    Critical care time (minutes):  30   Critical care time was exclusive of:  Separately billable procedures and treating other patients   Critical care was necessary to treat or prevent imminent or life-threatening deterioration of the following conditions:  CNS failure or compromise   Critical care was time spent personally by me on the following activities:  Development of treatment plan with patient or surrogate, evaluation of patient's response to treatment, examination of patient, obtaining history from patient or surrogate, ordering and performing treatments and interventions, ordering and review of laboratory studies, ordering and review of radiographic studies, pulse oximetry, re-evaluation of patient's condition and review of old charts     ED Discharge Orders     None      Final diagnoses:  Fall, initial encounter  Intracranial hemorrhage (HCC)  Laceration of occipital scalp, initial encounter  Hypothermia, initial encounter     Gordan Huxley, MD 03/02/24 0028

## 2024-03-01 NOTE — ED Notes (Signed)
 EDP notified of pt's low bp and declined LOC. PT was still answering questions but not opening his eyes fully.   Pt is going to CT and a verbal order was given to start fluids.

## 2024-03-01 NOTE — ED Notes (Signed)
 Nicholaus, MD at bedside to apply staples to wound on head

## 2024-03-01 NOTE — ED Notes (Signed)
 Called [facility] for possible transfer per Dr. Jossie

## 2024-03-01 NOTE — ED Notes (Signed)
Fall bundle in place

## 2024-03-01 NOTE — ED Notes (Signed)
 CCMD called for cardiac monitoring.

## 2024-03-01 NOTE — ED Provider Notes (Addendum)
 "  [facility] Provider Note    Event Date/Time   First MD Initiated Contact with Patient 03/01/24 1733     (approximate)   History   Fall   HPI  Frank Conley is a 64 y.o. male  for developmental delay, SSS s/p PPM not on anticoagulation, seizure disorder (managed by [facility]), renal mass on ongoing workup and treatment plan, HTN, depression, MGUS who presents to the emergency department with an unwitnessed fall at his group home.  Staff just heard a crash and found the patient laying on the floor.  Patient was not postictal but does not remember falling.  He did have a laceration to the back of his head and was brought emergently via EMS to our facility.  He denies any hearing or vision changes neck pain chest pain abdominal pain changes in urinary or bowel habits.  A member from his group home is at bedside who states that the patient is at his normal baseline mental status.     Physical Exam   Triage Vital Signs: ED Triage Vitals  Encounter Vitals Group     BP 03/01/24 1730 (!) 148/80     Girls Systolic BP Percentile --      Girls Diastolic BP Percentile --      Boys Systolic BP Percentile --      Boys Diastolic BP Percentile --      Pulse Rate 03/01/24 1730 60     Resp 03/01/24 1730 16     Temp 03/01/24 1730 (!) 97 F (36.1 C)     Temp Source 03/01/24 1730 Oral     SpO2 03/01/24 1730 100 %     Weight 03/01/24 1726 179 lb 14.3 oz (81.6 kg)     Height 03/01/24 1726 5' 8 (1.727 m)     Head Circumference --      Peak Flow --      Pain Score 03/01/24 1726 1     Pain Loc --      Pain Education --      Exclude from Growth Chart --     Most recent vital signs: Vitals:   03/01/24 1730 03/01/24 1900  BP: (!) 148/80 113/65  Pulse: 60 60  Resp: 16 12  Temp: (!) 97 F (36.1 C)   SpO2: 100% 100%    Nursing Triage Note reviewed. Vital signs reviewed and patients oxygen saturation is normoxic  General: Patient is well nourished, well developed,  awake and alert, resting comfortably in no acute distress Head: Normocephalic patient has a spiculated and deep laceration down to the galea on his occiput with active pumping red blood from the inferior and, there is missing tissue measuring about 5 cm in total length Eyes: Normal inspection, extraocular muscles intact, no conjunctival pallor Ear, nose, throat: Normal external exam Neck: Normal range of motion, no C-spine tenderness to palpation Respiratory: Patient is in no respiratory distress, lungs CTAB Cardiovascular: Patient is not tachycardic, RR GI: Abd SNT with no guarding or rebound  Back: Normal inspection of the back with good strength and range of motion throughout all ext No T or L-spine tenderness to palpation Extremities: pulses intact with good cap refills, no LE pitting edema or calf tenderness Neuro: The patient is alert and oriented to person, place, and time, appropriately conversive, with 5/5 bilat UE/LE strength, no gross motor or sensory defects noted. Coordination appears to be adequate. Psych: normal mood and affect, no SI or HI  ED Results /  Procedures / Treatments   Labs (all labs ordered are listed, but only abnormal results are displayed) Labs Reviewed  BASIC METABOLIC PANEL WITH GFR - Abnormal; Notable for the following components:      Result Value   Potassium 5.8 (*)    CO2 21 (*)    Glucose, Bld 112 (*)    BUN 56 (*)    Creatinine, Ser 2.28 (*)    Calcium  8.4 (*)    GFR, Estimated 32 (*)    All other components within normal limits  APTT - Abnormal; Notable for the following components:   aPTT 41 (*)    All other components within normal limits  CULTURE, BLOOD (ROUTINE X 2)  CULTURE, BLOOD (ROUTINE X 2)  LACTIC ACID, PLASMA  PROTIME-INR  CBC WITH DIFFERENTIAL/PLATELET  HEPATIC FUNCTION PANEL  LIPASE, BLOOD  URINALYSIS, ROUTINE W REFLEX MICROSCOPIC  TYPE AND SCREEN     EKG   RADIOLOGY CT head: Subdural hematoma my independent review  interpretation radiologist also reads this as:  Subdural hematomas along the falx eccentric to the right (5 mm) and along  the right tentorium (up to 4 mm), with associated subarachnoid hemorrhage along  the mesial right temporal lobe posteriorly, without significant mass effect or  midline shift      PROCEDURES:  Critical Care performed: Yes, see critical care procedure note(s)  .Critical Care  Performed by: Nicholaus Rolland BRAVO, MD Authorized by: Nicholaus Rolland BRAVO, MD   Critical care provider statement:    Critical care time (minutes):  35   Critical care was necessary to treat or prevent imminent or life-threatening deterioration of the following conditions:  CNS failure or compromise and trauma   Critical care was time spent personally by me on the following activities:  Development of treatment plan with patient or surrogate, discussions with consultants, evaluation of patient's response to treatment, examination of patient, ordering and review of laboratory studies, ordering and review of radiographic studies, ordering and performing treatments and interventions, pulse oximetry, re-evaluation of patient's condition and review of old charts Comments:     Subarachnoid and subdural hemorrhages requiring discussion with neurosurgery and ICU team .Laceration Repair  Date/Time: 03/01/2024 7:05 PM  Performed by: Nicholaus Rolland BRAVO, MD Authorized by: Nicholaus Rolland BRAVO, MD   Consent:    Consent obtained:  Emergent situation and verbal   Consent given by:  Patient and guardian   Risks discussed:  Infection, pain and poor cosmetic result   Alternatives discussed:  No treatment Universal protocol:    Site/side marked: yes     Immediately prior to procedure, a time out was called: yes     Patient identity confirmed:  Verbally with patient Anesthesia:    Anesthesia method:  Local infiltration   Local anesthetic:  Lidocaine  2% WITH epi Laceration details:    Location:  Scalp   Length (cm):   5   Depth (mm):  3 Pre-procedure details:    Preparation:  Patient was prepped and draped in usual sterile fashion Exploration:    Limited defect created (wound extended): no     Hemostasis achieved with:  Direct pressure and epinephrine    Imaging outcome: foreign body not noted     Wound exploration: entire depth of wound visualized     Wound extent: fascia violated and vascular damage     Contaminated: no   Treatment:    Area cleansed with:  Povidone-iodine   Irrigation solution:  Sterile saline   Irrigation volume:  500  cc   Irrigation method:  Pressure wash   Visualized foreign bodies/material removed: no     Debridement:  None   Undermining:  None   Scar revision: no     Layers/structures repaired:  Deep dermal/superficial fascia Deep dermal/superficial fascia:    Suture size:  4-0   Suture material:  Monocryl   Suture technique:  Buried horizontal mattress   Number of sutures:  3 Skin repair:    Repair method:  Sutures   Suture size:  4-0   Suture material:  Prolene   Suture technique:  Running locked   Number of sutures:  6 (6 throws of the continuous running locking and 1 simple interrupted on a different part of the stellate laceration) Approximation:    Approximation:  Close Repair type:    Repair type:  Complex Post-procedure details:    Dressing:  Non-adherent dressing   Procedure completion:  Tolerated well, no immediate complications    MEDICATIONS ORDERED IN ED: Medications  lidocaine -EPINEPHrine  (XYLOCAINE  W/EPI) 2 %-1:100000 (with pres) injection 20 mL (20 mLs Infiltration Given by Other 03/01/24 1754)  Tdap (ADACEL ) injection 0.5 mL (0.5 mLs Intramuscular Given 03/01/24 1838)     IMPRESSION / MDM / ASSESSMENT AND PLAN / ED COURSE                                Differential diagnosis includes, but is not limited to, intracranial hemorrhage, arterial bleed, sepsis, bacteremia, electrolyte derangement, anemia  ED course: Patient presents without  any focal neurological deficits after an unwitnessed fall.  When I removed the bandage that EMS placed on his scalp, there was a complicated laceration without arterial pumper.  Direct pressure was held until he could obtain supplies and this was controlled with lidocaine  with epi, and suture repair with Monocryl and Prolene (running locking) with good hemostasis of the wound.  CT head surprisingly did demonstrate large subdural hematomas and traumatic subarachnoid hemorrhage.  His case was discussed with Dr. Penne Sharps of neurosurgery who recommended transfer to a higher level of care with the ability to do continuous EEG as he does have a history of temporal seizures.  Patient was found to be hypothermic after arrival and last time he presented this way he did have a pneumonia.  Will obtain basic blood work which is pending chest x-ray and a urinalysis which is all pending as well.  Patient moved to the high acuity side and signout given to Dr. Artist Kerns, will plan to follow-up on these results and likely transfer to tertiary center   Clinical Course as of 03/01/24 1927  Sat Mar 01, 2024  1813 I was notified by radiology that patient has a significant head  [HD]  1813 Temp(!): 97 F (36.1 C) [HD]    Clinical Course User Index [HD] Nicholaus Rolland BRAVO, MD   -- Risk: 5 This patient has a high risk of morbidity due to further diagnostic testing or treatment. Rationale: This patients evaluation and management involve a high risk of morbidity due to the potential severity of presenting symptoms, need for diagnostic testing, and/or initiation of treatment that may require close monitoring. The differential includes conditions with potential for significant deterioration or requiring escalation of care. Treatment decisions in the ED, including medication administration, procedural interventions, or disposition planning, reflect this level of risk. COPA: 5 The patient has the following acute or chronic  illness/injury that poses a possible threat to  life or bodily function: [X] : The patient has a potentially serious acute condition or an acute exacerbation of a chronic illness requiring urgent evaluation and management in the Emergency Department. The clinical presentation necessitates immediate consideration of life-threatening or function-threatening diagnoses, even if they are ultimately ruled out.   FINAL CLINICAL IMPRESSION(S) / ED DIAGNOSES   Final diagnoses:  Fall, initial encounter  Intracranial hemorrhage (HCC)  Laceration of occipital scalp, initial encounter  Hypothermia, initial encounter     Rx / DC Orders   ED Discharge Orders     None        Note:  This document was prepared using Dragon voice recognition software and may include unintentional dictation errors.   Nicholaus Rolland BRAVO, MD 03/01/24 RANDALL    Nicholaus Rolland BRAVO, MD 03/01/24 2027  "

## 2024-03-01 NOTE — ED Provider Notes (Signed)
 Emergency department handoff note  Care of this patient was signed out to me at the end of the previous provider shift.  All pertinent patient information was conveyed and all questions were answered.  Patient pending acceptance by a neurocritical care team.  I spoken to [facility] who does not feel that patient is appropriate for ICU transfer at this time given that he has not had any further seizure activity and he is back to his baseline.  I have also spoken to the transfer center at [facility] who states that they are at capacity for EEG.  I then spoke to Dr. Dub on the critical care team at [facility] who agrees to accept this patient in transfer.  Neurosurgery Dr. Penne Sharps agrees with plan.  Patient updated and also agrees.  Dispo: Transfer to [facility] critical care   Xzavior Reinig K, MD 03/01/24 [phone]

## 2024-03-01 NOTE — ED Notes (Signed)
 Patient transported to CT

## 2024-03-01 NOTE — ED Notes (Signed)
 Rian, RN and [place], RN got verbal consent from pt's guardian to transfer pt to [facility].

## 2024-03-01 NOTE — ED Notes (Signed)
 Pt back from CT. BP is improved and pt is more alert. EDP notified.

## 2024-03-01 NOTE — ED Notes (Signed)
 EDP notified about pt's temp. [employer] put on pt.

## 2024-03-01 NOTE — ED Notes (Signed)
 Called [facility] for possible transfer per Dr. Jossie

## 2024-03-01 NOTE — Progress Notes (Signed)
 Neurosurgery imaging review:  Patient with a history of epilepsy and a  recent fall and subdural hematoma and traumatic SAH.  Tentorial location SDH is low risk for necessitating surgical evacuation, however given the temporal region bleed and traumatic subarachnoid bleed would recommend discussion with neurology for seizure prophylaxis and epilepsy management given the high risk location of blood products for inducing seizures.   Would recommend normotensive blood pressure goals and a repeat head CT in six hours. Patient not currently on any anticoagulation or anti-platelet medication. 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  We will follow up on repeat head CT.

## 2024-03-01 NOTE — ED Notes (Signed)
 Pt dressed out in hospital gown and socks. Belongings placed in pt belongings bag. Rectal temp prob placed.

## 2024-03-02 ENCOUNTER — Other Ambulatory Visit: Payer: Self-pay

## 2024-03-02 ENCOUNTER — Encounter (HOSPITAL_COMMUNITY): Payer: Self-pay

## 2024-03-02 ENCOUNTER — Inpatient Hospital Stay (HOSPITAL_COMMUNITY)
Admission: EM | Admit: 2024-03-02 | Discharge: 2024-03-07 | DRG: 082 | Disposition: A | Discharge status: Rehabilitation Facility | Attending: Internal Medicine | Admitting: Internal Medicine

## 2024-03-02 DIAGNOSIS — R0989 Other specified symptoms and signs involving the circulatory and respiratory systems: Secondary | ICD-10-CM | POA: Diagnosis present

## 2024-03-02 DIAGNOSIS — F429 Obsessive-compulsive disorder, unspecified: Secondary | ICD-10-CM | POA: Diagnosis present

## 2024-03-02 DIAGNOSIS — Z9181 History of falling: Secondary | ICD-10-CM

## 2024-03-02 DIAGNOSIS — R6250 Unspecified lack of expected normal physiological development in childhood: Secondary | ICD-10-CM | POA: Diagnosis present

## 2024-03-02 DIAGNOSIS — Z79899 Other long term (current) drug therapy: Secondary | ICD-10-CM

## 2024-03-02 DIAGNOSIS — G40909 Epilepsy, unspecified, not intractable, without status epilepticus: Secondary | ICD-10-CM | POA: Diagnosis not present

## 2024-03-02 DIAGNOSIS — N179 Acute kidney failure, unspecified: Secondary | ICD-10-CM | POA: Diagnosis present

## 2024-03-02 DIAGNOSIS — S0101XA Laceration without foreign body of scalp, initial encounter: Secondary | ICD-10-CM | POA: Diagnosis present

## 2024-03-02 DIAGNOSIS — Z634 Disappearance and death of family member: Secondary | ICD-10-CM

## 2024-03-02 DIAGNOSIS — S069X0A Unspecified intracranial injury without loss of consciousness, initial encounter: Secondary | ICD-10-CM | POA: Diagnosis not present

## 2024-03-02 DIAGNOSIS — I495 Sick sinus syndrome: Secondary | ICD-10-CM | POA: Diagnosis present

## 2024-03-02 DIAGNOSIS — J9811 Atelectasis: Secondary | ICD-10-CM | POA: Diagnosis present

## 2024-03-02 DIAGNOSIS — N189 Chronic kidney disease, unspecified: Secondary | ICD-10-CM

## 2024-03-02 DIAGNOSIS — Z1152 Encounter for screening for COVID-19: Secondary | ICD-10-CM

## 2024-03-02 DIAGNOSIS — M7989 Other specified soft tissue disorders: Secondary | ICD-10-CM | POA: Diagnosis not present

## 2024-03-02 DIAGNOSIS — N2889 Other specified disorders of kidney and ureter: Secondary | ICD-10-CM | POA: Diagnosis not present

## 2024-03-02 DIAGNOSIS — I10 Essential (primary) hypertension: Secondary | ICD-10-CM | POA: Diagnosis present

## 2024-03-02 DIAGNOSIS — J189 Pneumonia, unspecified organism: Secondary | ICD-10-CM | POA: Diagnosis present

## 2024-03-02 DIAGNOSIS — W19XXXA Unspecified fall, initial encounter: Secondary | ICD-10-CM | POA: Diagnosis not present

## 2024-03-02 DIAGNOSIS — F323 Major depressive disorder, single episode, severe with psychotic features: Secondary | ICD-10-CM | POA: Diagnosis present

## 2024-03-02 DIAGNOSIS — Z66 Do not resuscitate: Secondary | ICD-10-CM | POA: Diagnosis present

## 2024-03-02 DIAGNOSIS — S066XAA Traumatic subarachnoid hemorrhage with loss of consciousness status unknown, initial encounter: Secondary | ICD-10-CM | POA: Diagnosis present

## 2024-03-02 DIAGNOSIS — S065XAA Traumatic subdural hemorrhage with loss of consciousness status unknown, initial encounter: Secondary | ICD-10-CM | POA: Diagnosis not present

## 2024-03-02 DIAGNOSIS — D696 Thrombocytopenia, unspecified: Secondary | ICD-10-CM | POA: Diagnosis present

## 2024-03-02 DIAGNOSIS — I5032 Chronic diastolic (congestive) heart failure: Secondary | ICD-10-CM | POA: Diagnosis present

## 2024-03-02 DIAGNOSIS — E162 Hypoglycemia, unspecified: Secondary | ICD-10-CM | POA: Diagnosis present

## 2024-03-02 DIAGNOSIS — D472 Monoclonal gammopathy: Secondary | ICD-10-CM | POA: Diagnosis not present

## 2024-03-02 DIAGNOSIS — S0990XA Unspecified injury of head, initial encounter: Principal | ICD-10-CM

## 2024-03-02 DIAGNOSIS — Z8701 Personal history of pneumonia (recurrent): Secondary | ICD-10-CM

## 2024-03-02 DIAGNOSIS — Z7982 Long term (current) use of aspirin: Secondary | ICD-10-CM

## 2024-03-02 DIAGNOSIS — F849 Pervasive developmental disorder, unspecified: Secondary | ICD-10-CM | POA: Diagnosis present

## 2024-03-02 DIAGNOSIS — F419 Anxiety disorder, unspecified: Secondary | ICD-10-CM | POA: Diagnosis present

## 2024-03-02 DIAGNOSIS — E860 Dehydration: Secondary | ICD-10-CM | POA: Diagnosis present

## 2024-03-02 DIAGNOSIS — R296 Repeated falls: Secondary | ICD-10-CM | POA: Diagnosis present

## 2024-03-02 DIAGNOSIS — S069XAA Unspecified intracranial injury with loss of consciousness status unknown, initial encounter: Secondary | ICD-10-CM | POA: Diagnosis present

## 2024-03-02 DIAGNOSIS — R402252 Coma scale, best verbal response, oriented, at arrival to emergency department: Secondary | ICD-10-CM | POA: Diagnosis present

## 2024-03-02 DIAGNOSIS — A419 Sepsis, unspecified organism: Secondary | ICD-10-CM | POA: Diagnosis present

## 2024-03-02 DIAGNOSIS — I130 Hypertensive heart and chronic kidney disease with heart failure and stage 1 through stage 4 chronic kidney disease, or unspecified chronic kidney disease: Secondary | ICD-10-CM | POA: Diagnosis present

## 2024-03-02 DIAGNOSIS — D631 Anemia in chronic kidney disease: Secondary | ICD-10-CM | POA: Diagnosis present

## 2024-03-02 DIAGNOSIS — R680 Hypothermia, not associated with low environmental temperature: Secondary | ICD-10-CM | POA: Diagnosis present

## 2024-03-02 DIAGNOSIS — R1312 Dysphagia, oropharyngeal phase: Secondary | ICD-10-CM | POA: Diagnosis present

## 2024-03-02 DIAGNOSIS — D630 Anemia in neoplastic disease: Secondary | ICD-10-CM | POA: Diagnosis present

## 2024-03-02 DIAGNOSIS — R402362 Coma scale, best motor response, obeys commands, at arrival to emergency department: Secondary | ICD-10-CM | POA: Diagnosis present

## 2024-03-02 DIAGNOSIS — E875 Hyperkalemia: Secondary | ICD-10-CM | POA: Diagnosis present

## 2024-03-02 DIAGNOSIS — Z888 Allergy status to other drugs, medicaments and biological substances status: Secondary | ICD-10-CM

## 2024-03-02 DIAGNOSIS — D61818 Other pancytopenia: Secondary | ICD-10-CM | POA: Diagnosis present

## 2024-03-02 DIAGNOSIS — G2119 Other drug induced secondary parkinsonism: Secondary | ICD-10-CM | POA: Diagnosis present

## 2024-03-02 DIAGNOSIS — R809 Proteinuria, unspecified: Secondary | ICD-10-CM | POA: Diagnosis present

## 2024-03-02 DIAGNOSIS — Z85528 Personal history of other malignant neoplasm of kidney: Secondary | ICD-10-CM

## 2024-03-02 DIAGNOSIS — N1831 Chronic kidney disease, stage 3a: Secondary | ICD-10-CM | POA: Diagnosis present

## 2024-03-02 DIAGNOSIS — R402142 Coma scale, eyes open, spontaneous, at arrival to emergency department: Secondary | ICD-10-CM | POA: Diagnosis present

## 2024-03-02 DIAGNOSIS — Z8673 Personal history of transient ischemic attack (TIA), and cerebral infarction without residual deficits: Secondary | ICD-10-CM

## 2024-03-02 DIAGNOSIS — Z950 Presence of cardiac pacemaker: Secondary | ICD-10-CM

## 2024-03-02 LAB — URINE DRUG SCREEN
Amphetamines: NEGATIVE
Barbiturates: NEGATIVE
Benzodiazepines: POSITIVE — AB
Cocaine: NEGATIVE
Fentanyl: NEGATIVE
Methadone Scn, Ur: NEGATIVE
Opiates: NEGATIVE
Tetrahydrocannabinol: NEGATIVE

## 2024-03-02 LAB — CBC
HCT: 22.3 % — ABNORMAL LOW (ref 39.0–52.0)
Hemoglobin: 7.2 g/dL — ABNORMAL LOW (ref 13.0–17.0)
MCH: 32.4 pg (ref 26.0–34.0)
MCHC: 32.3 g/dL (ref 30.0–36.0)
MCV: 100.5 fL — ABNORMAL HIGH (ref 80.0–100.0)
Platelets: 51 10*3/uL — ABNORMAL LOW (ref 150–400)
RBC: 2.22 MIL/uL — ABNORMAL LOW (ref 4.22–5.81)
RDW: 17.5 % — ABNORMAL HIGH (ref 11.5–15.5)
WBC: 4.2 10*3/uL (ref 4.0–10.5)
nRBC: 0 % (ref 0.0–0.2)

## 2024-03-02 LAB — TECHNOLOGIST SMEAR REVIEW: Plt Morphology: NORMAL

## 2024-03-02 LAB — URINALYSIS, W/ REFLEX TO CULTURE (INFECTION SUSPECTED)
Bilirubin Urine: NEGATIVE
Glucose, UA: NEGATIVE mg/dL
Hgb urine dipstick: NEGATIVE
Ketones, ur: NEGATIVE mg/dL
Leukocytes,Ua: NEGATIVE
Nitrite: NEGATIVE
Protein, ur: 30 mg/dL — AB
Specific Gravity, Urine: 1.017 (ref 1.005–1.030)
pH: 5 (ref 5.0–8.0)

## 2024-03-02 LAB — TYPE AND SCREEN
ABO/RH(D): B NEG
Antibody Screen: NEGATIVE

## 2024-03-02 LAB — CBC WITH DIFFERENTIAL/PLATELET
Abs Immature Granulocytes: 0.03 10*3/uL (ref 0.00–0.07)
Basophils Absolute: 0 10*3/uL (ref 0.0–0.1)
Basophils Relative: 0 %
Eosinophils Absolute: 0 10*3/uL (ref 0.0–0.5)
Eosinophils Relative: 1 %
HCT: 22.1 % — ABNORMAL LOW (ref 39.0–52.0)
Hemoglobin: 7.1 g/dL — ABNORMAL LOW (ref 13.0–17.0)
Immature Granulocytes: 1 %
Lymphocytes Relative: 12 %
Lymphs Abs: 0.5 10*3/uL — ABNORMAL LOW (ref 0.7–4.0)
MCH: 32 pg (ref 26.0–34.0)
MCHC: 32.1 g/dL (ref 30.0–36.0)
MCV: 99.5 fL (ref 80.0–100.0)
Monocytes Absolute: 0.3 10*3/uL (ref 0.1–1.0)
Monocytes Relative: 7 %
Neutro Abs: 3.3 10*3/uL (ref 1.7–7.7)
Neutrophils Relative %: 79 %
Platelets: 51 10*3/uL — ABNORMAL LOW (ref 150–400)
RBC: 2.22 MIL/uL — ABNORMAL LOW (ref 4.22–5.81)
RDW: 17.5 % — ABNORMAL HIGH (ref 11.5–15.5)
WBC: 4.1 10*3/uL (ref 4.0–10.5)
nRBC: 0 % (ref 0.0–0.2)

## 2024-03-02 LAB — BASIC METABOLIC PANEL WITH GFR
Anion gap: 5 (ref 5–15)
BUN: 51 mg/dL — ABNORMAL HIGH (ref 8–23)
CO2: 22 mmol/L (ref 22–32)
Calcium: 7.5 mg/dL — ABNORMAL LOW (ref 8.9–10.3)
Chloride: 116 mmol/L — ABNORMAL HIGH (ref 98–111)
Creatinine, Ser: 2.02 mg/dL — ABNORMAL HIGH (ref 0.61–1.24)
GFR, Estimated: 37 mL/min — ABNORMAL LOW
Glucose, Bld: 75 mg/dL (ref 70–99)
Potassium: 4.7 mmol/L (ref 3.5–5.1)
Sodium: 143 mmol/L (ref 135–145)

## 2024-03-02 LAB — RETIC PANEL
Immature Retic Fract: 15 % (ref 2.3–15.9)
RBC.: 2.26 MIL/uL — ABNORMAL LOW (ref 4.22–5.81)
Retic Count, Absolute: 30.1 10*3/uL (ref 19.0–186.0)
Retic Ct Pct: 1.3 % (ref 0.4–3.1)
Reticulocyte Hemoglobin: 32.7 pg

## 2024-03-02 LAB — FOLATE: Folate: 10.3 ng/mL

## 2024-03-02 LAB — CBG MONITORING, ED
Glucose-Capillary: 66 mg/dL — ABNORMAL LOW (ref 70–99)
Glucose-Capillary: 55 mg/dL — ABNORMAL LOW (ref 70–99)
Glucose-Capillary: 97 mg/dL (ref 70–99)
Glucose-Capillary: 51 mg/dL — ABNORMAL LOW (ref 70–99)
Glucose-Capillary: 60 mg/dL — ABNORMAL LOW (ref 70–99)
Glucose-Capillary: 70 mg/dL (ref 70–99)
Glucose-Capillary: 89 mg/dL (ref 70–99)
Glucose-Capillary: 90 mg/dL (ref 70–99)
Glucose-Capillary: 93 mg/dL (ref 70–99)
Glucose-Capillary: 92 mg/dL (ref 70–99)
Glucose-Capillary: 87 mg/dL (ref 70–99)

## 2024-03-02 LAB — LACTATE DEHYDROGENASE: LDH: 201 U/L (ref 105–235)

## 2024-03-02 LAB — RESP PANEL BY RT-PCR (RSV, FLU A&B, COVID)  RVPGX2
Influenza A by PCR: NEGATIVE
Influenza B by PCR: NEGATIVE
Resp Syncytial Virus by PCR: NEGATIVE
SARS Coronavirus 2 by RT PCR: NEGATIVE

## 2024-03-02 LAB — HEMOGLOBIN AND HEMATOCRIT, BLOOD
HCT: 24.2 % — ABNORMAL LOW (ref 39.0–52.0)
Hemoglobin: 7.4 g/dL — ABNORMAL LOW (ref 13.0–17.0)

## 2024-03-02 LAB — IMMATURE PLATELET FRACTION: Immature Platelet Fraction: 3.3 % (ref 1.2–8.6)

## 2024-03-02 LAB — CORTISOL: Cortisol, Plasma: 11.7 ug/dL

## 2024-03-02 LAB — FERRITIN: Ferritin: 255 ng/mL (ref 24–336)

## 2024-03-02 LAB — VITAMIN B12: Vitamin B-12: 1995 pg/mL — ABNORMAL HIGH (ref 180–914)

## 2024-03-02 LAB — TSH: TSH: 1.78 u[IU]/mL (ref 0.350–4.500)

## 2024-03-02 MED ORDER — HYDRALAZINE HCL 10 MG PO TABS: 10.0000 mg | ORAL_TABLET | Freq: Four times a day (QID) | ORAL | Status: DC | PRN

## 2024-03-02 MED ORDER — FERROUS SULFATE 325 (65 FE) MG PO TABS
325.0000 mg | ORAL_TABLET | Freq: Every day | ORAL | Status: DC
  Administered 2024-03-02 – 2024-03-07 (×6): 325 mg via ORAL
  Filled 2024-03-02 (×6): qty 1

## 2024-03-02 MED ORDER — QUETIAPINE FUMARATE 50 MG PO TABS
150.0000 mg | ORAL_TABLET | Freq: Two times a day (BID) | ORAL | Status: DC
  Administered 2024-03-02 – 2024-03-07 (×10): 150 mg via ORAL
  Filled 2024-03-02: qty 2
  Filled 2024-03-02 (×3): qty 1
  Filled 2024-03-02: qty 2
  Filled 2024-03-02 (×5): qty 1

## 2024-03-02 MED ORDER — LAMOTRIGINE 100 MG PO TABS: 150.0000 mg | ORAL_TABLET | Freq: Every day | ORAL | Status: DC

## 2024-03-02 MED ORDER — MAGNESIUM OXIDE -MG SUPPLEMENT 400 (240 MG) MG PO TABS
400.0000 mg | ORAL_TABLET | Freq: Every day | ORAL | Status: DC
  Administered 2024-03-02 – 2024-03-06 (×5): 400 mg via ORAL
  Filled 2024-03-02 (×6): qty 1

## 2024-03-02 MED ORDER — SODIUM CHLORIDE 0.9 % IV BOLUS
1000.0000 mL | Freq: Once | INTRAVENOUS | Status: AC
  Administered 2024-03-02: 1000 mL via INTRAVENOUS

## 2024-03-02 MED ORDER — OXYCODONE HCL 5 MG PO TABS: 5.0000 mg | ORAL_TABLET | ORAL | Status: DC | PRN

## 2024-03-02 MED ORDER — VITAMIN B-12 1000 MCG PO TABS
1000.0000 ug | ORAL_TABLET | Freq: Every day | ORAL | Status: DC
  Administered 2024-03-02 – 2024-03-07 (×6): 1000 ug via ORAL
  Filled 2024-03-02 (×6): qty 1

## 2024-03-02 MED ORDER — PROCHLORPERAZINE EDISYLATE 10 MG/2ML IJ SOLN
5.0000 mg | Freq: Four times a day (QID) | INTRAMUSCULAR | Status: DC | PRN
  Administered 2024-03-05: 5 mg via INTRAVENOUS
  Filled 2024-03-02: qty 2

## 2024-03-02 MED ORDER — SENNA 8.6 MG PO TABS
1.0000 | ORAL_TABLET | Freq: Every day | ORAL | Status: DC | PRN
  Administered 2024-03-05: 8.6 mg via ORAL
  Filled 2024-03-02: qty 1

## 2024-03-02 MED ORDER — VANCOMYCIN HCL 1500 MG/300ML IV SOLN
1500.0000 mg | Freq: Once | INTRAVENOUS | Status: AC
  Administered 2024-03-02: 1500 mg via INTRAVENOUS
  Filled 2024-03-02: qty 300

## 2024-03-02 MED ORDER — METRONIDAZOLE 500 MG/100ML IV SOLN
500.0000 mg | Freq: Once | INTRAVENOUS | Status: AC
  Administered 2024-03-02: 500 mg via INTRAVENOUS
  Filled 2024-03-02: qty 100

## 2024-03-02 MED ORDER — SODIUM CHLORIDE 0.9 % IV SOLN
2.0000 g | Freq: Two times a day (BID) | INTRAVENOUS | Status: DC
  Administered 2024-03-02 – 2024-03-04 (×4): 2 g via INTRAVENOUS
  Filled 2024-03-02 (×4): qty 12.5

## 2024-03-02 MED ORDER — CLONAZEPAM 0.5 MG PO TABS
0.5000 mg | ORAL_TABLET | Freq: Three times a day (TID) | ORAL | Status: DC
  Administered 2024-03-02 – 2024-03-07 (×16): 0.5 mg via ORAL
  Filled 2024-03-02 (×16): qty 1

## 2024-03-02 MED ORDER — SODIUM CHLORIDE 0.9% FLUSH
3.0000 mL | Freq: Two times a day (BID) | INTRAVENOUS | Status: DC
  Administered 2024-03-02 – 2024-03-07 (×11): 3 mL via INTRAVENOUS

## 2024-03-02 MED ORDER — VANCOMYCIN HCL IN DEXTROSE 1-5 GM/200ML-% IV SOLN
1000.0000 mg | INTRAVENOUS | Status: DC
  Administered 2024-03-03 – 2024-03-04 (×2): 1000 mg via INTRAVENOUS
  Filled 2024-03-02 (×2): qty 200

## 2024-03-02 MED ORDER — DEXTROSE 50 % IV SOLN
25.0000 mL | Freq: Once | INTRAVENOUS | Status: AC
  Administered 2024-03-02: 25 mL via INTRAVENOUS

## 2024-03-02 MED ORDER — QUETIAPINE FUMARATE 100 MG PO TABS
100.0000 mg | ORAL_TABLET | ORAL | Status: DC
  Administered 2024-03-02 – 2024-03-07 (×6): 100 mg via ORAL
  Filled 2024-03-02 (×6): qty 1

## 2024-03-02 MED ORDER — LAMOTRIGINE 100 MG PO TABS
150.0000 mg | ORAL_TABLET | Freq: Every day | ORAL | Status: DC
  Administered 2024-03-02: 150 mg via ORAL
  Filled 2024-03-02: qty 2

## 2024-03-02 MED ORDER — LAMOTRIGINE 25 MG PO TABS
175.0000 mg | ORAL_TABLET | Freq: Every day | ORAL | Status: DC
  Filled 2024-03-02: qty 3

## 2024-03-02 MED ORDER — ACETAMINOPHEN 650 MG RE SUPP: 650.0000 mg | Freq: Four times a day (QID) | RECTAL | Status: DC | PRN

## 2024-03-02 MED ORDER — DEXTROSE 10 % IV SOLN: INTRAVENOUS | Status: DC

## 2024-03-02 MED ORDER — ACETAMINOPHEN 325 MG PO TABS
650.0000 mg | ORAL_TABLET | Freq: Four times a day (QID) | ORAL | Status: DC | PRN
  Administered 2024-03-04 – 2024-03-06 (×2): 650 mg via ORAL
  Filled 2024-03-02 (×2): qty 2

## 2024-03-02 MED ORDER — LAMOTRIGINE 25 MG PO TABS: 175.0000 mg | ORAL_TABLET | Freq: Every day | ORAL | Status: DC

## 2024-03-02 MED ORDER — FENTANYL CITRATE (PF) 50 MCG/ML IJ SOSY: 12.5000 ug | PREFILLED_SYRINGE | INTRAMUSCULAR | Status: DC | PRN

## 2024-03-02 MED ORDER — SODIUM CHLORIDE 0.9 % IV BOLUS: 500.0000 mL | Freq: Once | INTRAVENOUS | Status: DC

## 2024-03-02 MED ORDER — DEXTROSE 50 % IV SOLN
25.0000 mL | Freq: Once | INTRAVENOUS | Status: AC
  Administered 2024-03-02: 25 mL via INTRAVENOUS
  Filled 2024-03-02: qty 50

## 2024-03-02 MED ORDER — DEXTROSE-SODIUM CHLORIDE 5-0.9 % IV SOLN: INTRAVENOUS | Status: AC

## 2024-03-02 MED ORDER — VANCOMYCIN HCL IN DEXTROSE 1-5 GM/200ML-% IV SOLN: 1000.0000 mg | Freq: Once | INTRAVENOUS | Status: DC

## 2024-03-02 MED ORDER — SODIUM CHLORIDE 0.9 % IV SOLN
2.0000 g | Freq: Once | INTRAVENOUS | Status: AC
  Administered 2024-03-02: 2 g via INTRAVENOUS
  Filled 2024-03-02: qty 12.5

## 2024-03-02 MED ORDER — SODIUM CHLORIDE 0.9 % IV SOLN
200.0000 mg | Freq: Once | INTRAVENOUS | Status: AC
  Administered 2024-03-02: 200 mg via INTRAVENOUS
  Filled 2024-03-02: qty 20

## 2024-03-02 MED ORDER — LACOSAMIDE 50 MG PO TABS
100.0000 mg | ORAL_TABLET | Freq: Two times a day (BID) | ORAL | Status: DC
  Administered 2024-03-02 – 2024-03-07 (×10): 100 mg via ORAL
  Filled 2024-03-02 (×10): qty 2

## 2024-03-02 NOTE — H&P (Signed)
 " History and Physical    [facility] FMW:[id number] DOB: 07/13/1960 DOA: 03/02/2024  PCP: Lizette Lango, MD   Patient coming from: Group home   Chief Complaint: Scalp laceration   HPI: Frank Conley is a 64 y.o. male with medical history significant for developmental delay, depression, seizure disorder, SSS s/p PPM, hypertension, CKD 3A, and MGUS who presents with a scalp laceration.   Patient apparently had an unwitnessed fall at his group home.  Someone heard him fall and found him laying on the floor.  He was not postictal at the time per report but does not remember the fall.  He denies any headache, neck pain, chest pain, abdominal pain, dysuria, cough, or shortness of breath.  Per report of the patient's brother and group home personnel, the patient is at his baseline mental status in the ED.  [facility] ED Course: Upon arrival to the ED, patient was found to be hypothermic and saturating well on room air with normal HR and SBP 80s and greater.  Labs are most notable for potassium 5.8, BUN 56, creatinine 2.28, normal WBC, normal lactate, normal INR, hemoglobin 8.4, and platelets 55.  Head CT was concerning for SDH along the falx and right tentorium, and SAH along the right temporal lobe posteriorly without significant mass effect or midline shift.  There were no acute findings on chest x-ray.  Neurosurgery was consulted by the ED physician and recommended repeat head CT in 6 hours, maintaining normotension, and observing for seizures as bleed location poses a high seizure risk.  EEG was not available at [facility] and so PCCM at [facility] was consulted by the ED physician and accepted the patient.  Neurology at [facility] was also consulted by the ED physician.  Patient appears stable on arrival to the [facility] ED with no obvious ICU needs, and so PCCM recommended hospitalist admission.  Review of Systems:  All other systems reviewed and apart from HPI, are negative.  Past Medical History:  Diagnosis  Date   Depression    Hypertension    MGUS (monoclonal gammopathy of unknown significance)    Pervasive developmental disorder    Seizures (HCC)    SSS (sick sinus syndrome) (HCC)    PPM placed    Past Surgical History:  Procedure Laterality Date   PACEMAKER IMPLANT      Social History:   reports that he has never smoked. He has never used smokeless tobacco. He reports that he does not drink alcohol and does not use drugs.  Allergies[1]  Family History  Family history unknown: Yes     Prior to Admission medications  Medication Sig Start Date End Date Taking? Authorizing Provider  magnesium  oxide (MAG-OX) 400 MG tablet Take 400 mg by mouth daily. 02/13/24 02/12/25 Yes [provider]  acetaminophen  (TYLENOL ) 325 MG tablet Take 650 mg by mouth every 6 (six) hours as needed for mild pain (pain score 1-3).    [provider]  aspirin  EC 81 MG tablet TAKE 1 TABLET BY MOUTH ONCE DAILY SWALLOW WHOLE 05/09/23   [provider]  clonazePAM  (KLONOPIN ) 0.5 MG tablet Take 0.5 mg by mouth 2 (two) times daily. 01/17/24   [provider]  cloNIDine  (CATAPRES  - DOSED IN MG/24 HR) 0.2 mg/24hr patch Place 0.2 mg onto the skin once a week. Thursday 11/29/18   [provider]  cyanocobalamin  (VITAMIN B12) 1000 MCG tablet Take 1,000 mcg by mouth daily. 11/20/23   [provider]  ferrous sulfate   325 (65 FE) MG tablet Take 325 mg by mouth daily.    [provider]  ibuprofen  (ADVIL ) 200 MG tablet Take 200 mg by mouth every 6 (six) hours as needed for fever.    [provider]  ketoconazole (NIZORAL) 2 % cream Apply 1 Application topically daily as needed for irritation.    [provider]  lamoTRIgine  (LAMICTAL ) 100 MG tablet Take 100 mg by mouth 2 (two) times daily. 12/04/18   [provider]  lamoTRIgine  (LAMICTAL ) 25 MG tablet Take 50-75 mg by mouth See admin instructions. Take in addition to 100 mg tablet for a  total morning dose of 150mg  and bedtime dose of 175mg  12/04/18   [provider]  loperamide (IMODIUM A-D) 2 MG tablet Take 2 mg by mouth 4 (four) times daily as needed for diarrhea or loose stools.    [provider]  loratadine  (CLARITIN ) 10 MG tablet Take 10 mg by mouth daily.    [provider]  Melatonin 3 MG TABS Take 3 mg by mouth every evening.    [provider]  Multiple Vitamins-Minerals (PRESERVISION AREDS 2) CAPS Take 1 capsule by mouth 2 (two) times daily.    [provider]  Omega-3 Fatty Acids (FISH OIL) 1000 MG CAPS Take 2,000 mg by mouth daily.    [provider]  polyethylene glycol (MIRALAX  / GLYCOLAX ) 17 g packet Take 17 g by mouth in the morning, at noon, and at bedtime.    [provider]  QUEtiapine  (SEROQUEL ) 100 MG tablet Take 100-150 mg by mouth See admin instructions. Take 1 tablet by mouth twice a day then 1.5 tablets at bedtime 12/04/18   [provider]  rosuvastatin  (CRESTOR ) 20 MG tablet Take 1 tablet (20 mg total) by mouth daily. 12/19/22 02/08/24  Briana Elgin LABOR, MD  senna (SENOKOT) 8.6 MG TABS tablet Take 2 tablets by mouth at bedtime. Patient taking differently: Take 2 tablets by mouth daily as needed.    [provider]    Physical Exam: Vitals:   03/02/24 0130 03/02/24 0131 03/02/24 0148  BP: 118/70    Pulse:   60  Resp: 16  14  Temp:   (!) 93.1 F (33.9 C)  SpO2: 100%  100%  Weight:  81.6 kg   Height:  5' 8 (1.727 m)      Constitutional: NAD, no diaphoresis   Eyes: PERTLA, lids and conjunctivae normal ENMT: Mucous membranes are moist. Posterior pharynx clear of any exudate or lesions.   Neck: supple, no masses  Respiratory: no wheezing, no crackles. No accessory muscle use.  Cardiovascular: S1 & S2 heard, regular rate and rhythm. No extremity edema.  Abdomen: No tenderness, soft. Bowel sounds active.  Musculoskeletal: no clubbing / cyanosis. No joint deformity  upper and lower extremities.   Skin: Hyperpigmentation involving b/l LEs in gaiter distribution. Dry, well-perfused. Neurologic: Sleeping, wakes to voice and oriented to person and place. Moving all extremities.  Psychiatric: Calm. Cooperative.    Labs and Imaging on Admission: I have personally reviewed following labs and imaging studies  CBC: Recent Labs  Lab 03/01/24 1821  WBC 4.9  NEUTROABS 3.9  HGB 8.4*  HCT 26.6*  MCV 101.1*  PLT 55*   Basic Metabolic Panel: Recent Labs  Lab 03/01/24 1821  NA 139  K 5.8*  CL 108  CO2 21*  GLUCOSE 112*  BUN 56*  CREATININE 2.28*  CALCIUM  8.4*   GFR: Estimated Creatinine Clearance: 32.9 mL/min (A) (  by C-G formula based on SCr of 2.28 mg/dL (H)). Liver Function Tests: Recent Labs  Lab 03/01/24 1821  AST 52*  ALT 32  ALKPHOS 128*  BILITOT 0.3  PROT 6.1*  ALBUMIN 3.4*   Recent Labs  Lab 03/01/24 1821  LIPASE 23   No results for input(s): AMMONIA in the last 168 hours. Coagulation Profile: Recent Labs  Lab 03/01/24 1821  INR 1.1   Cardiac Enzymes: No results for input(s): CKTOTAL, CKMB, CKMBINDEX, TROPONINI in the last 168 hours. BNP (last 3 results) No results for input(s): PROBNP in the last 8760 hours. HbA1C: No results for input(s): HGBA1C in the last 72 hours. CBG: Recent Labs  Lab 03/02/24 0126 03/02/24 0217  GLUCAP 66* 55*   Lipid Profile: No results for input(s): CHOL, HDL, LDLCALC, TRIG, CHOLHDL, LDLDIRECT in the last 72 hours. Thyroid Function Tests: No results for input(s): TSH, T4TOTAL, FREET4, T3FREE, THYROIDAB in the last 72 hours. Anemia Panel: No results for input(s): VITAMINB12, FOLATE, FERRITIN, TIBC, IRON, RETICCTPCT in the last 72 hours. Urine analysis:    Component Value Date/Time   COLORURINE YELLOW (A) 11/29/2023 1719   APPEARANCEUR CLEAR (A) 11/29/2023 1719   APPEARANCEUR Clear 10/24/2013 2006   LABSPEC 1.014 11/29/2023 1719    LABSPEC 1.006 10/24/2013 2006   PHURINE 5.0 11/29/2023 1719   GLUCOSEU NEGATIVE 11/29/2023 1719   GLUCOSEU 150 mg/dL 90/81/7984 7993   HGBUR NEGATIVE 11/29/2023 1719   BILIRUBINUR NEGATIVE 11/29/2023 1719   BILIRUBINUR Negative 10/24/2013 2006   KETONESUR NEGATIVE 11/29/2023 1719   PROTEINUR 100 (A) 11/29/2023 1719   NITRITE NEGATIVE 11/29/2023 1719   LEUKOCYTESUR NEGATIVE 11/29/2023 1719   LEUKOCYTESUR Negative 10/24/2013 2006   Sepsis Labs: @LABRCNTIP (procalcitonin:4,lacticidven:4) )No results found for this or any previous visit (from the past 240 hours).   Radiological Exams on Admission: CT Head Wo Contrast Result Date: 03/01/2024 EXAM: CT HEAD WITHOUT CONTRAST 03/01/2024 11:27:36 PM TECHNIQUE: CT of the head was performed without the administration of intravenous contrast. Automated exposure control, iterative reconstruction, and/or weight based adjustment of the mA/kV was utilized to reduce the radiation dose to as low as reasonably achievable. COMPARISON: 03/01/2024 CLINICAL HISTORY: Syncope and presyncope, cerebrovascular cause suspected; re-evaluation for intracranial hemorrhage. FINDINGS: BRAIN AND VENTRICLES: Similar right parafalcine subdural hemorrhage measuring up to 8 mm anteriorly. Small volume subarachnoid and subdural hemorrhage along right tentorial leaflet, unchanged. Calcific intracranial atherosclerosis. No evidence of acute infarct. No hydrocephalus. No mass effect or midline shift. ORBITS: Remote left medial orbital wall fracture. SINUSES: No acute abnormality. SOFT TISSUES AND SKULL: Left mastoid effusion. No acute soft tissue abnormality. No skull fracture. IMPRESSION: 1. Similar right parafalcine subdural hemorrhage measuring up to 8 mm anteriorly and small volume subarachnoid and subdural hemorrhage along the right tentorial leaflet, unchanged. 2. Remote left medial orbital wall fracture. Electronically signed by: Dorethia Molt MD 03/01/2024 11:37 PM EST RP  Workstation: [id number]   DG Chest Port 1 View Result Date: 03/01/2024 EXAM: 1 VIEW(S) XRAY OF THE CHEST 03/01/2024 07:09:00 PM COMPARISON: 02/08/2024 CLINICAL HISTORY: Cough. FINDINGS: LINES, TUBES AND DEVICES: Right chest cardiac pacing device in place. LUNGS AND PLEURA: Low lung volumes. Mild left basilar atelectasis. No focal pulmonary opacity. No pleural effusion. No pneumothorax. HEART AND MEDIASTINUM: No acute abnormality of the cardiac and mediastinal silhouettes. BONES AND SOFT TISSUES: No acute osseous abnormality. IMPRESSION: 1. Low lung volumes with mild left basilar atelectasis. 2. Right chest cardiac pacing device in place. 3. No acute findings. Electronically signed by: Dorethia Molt MD 03/01/2024  07:16 PM EST RP Workstation: [id number]   CT Head Wo Contrast Result Date: 03/01/2024 EXAM: CT HEAD WITHOUT CONTRAST 03/01/2024 06:03:19 PM TECHNIQUE: CT of the head was performed without the administration of intravenous contrast. Automated exposure control, iterative reconstruction, and/or weight based adjustment of the mA/kV was utilized to reduce the radiation dose to as low as reasonably achievable. COMPARISON: 01/11/2024. CLINICAL HISTORY: Fall with posterior head injury, initial encounter. FINDINGS: BRAIN AND VENTRICLES: Subdural hematomas are noted along the falx eccentric to the right, measuring 5 mm in greatest dimension. Additionally, these are noted along the tentorium on the right, measuring up to 4 mm in thickness. Some associated subarachnoid hemorrhage is noted along the mesial right temporal lobe posteriorly. No other definitive subarachnoid hemorrhage is noted. No evidence of acute infarct. No mass effect or midline shift. ORBITS: No acute abnormality. SINUSES: No acute abnormality. SOFT TISSUES AND SKULL: A mild scalp hematoma is noted in the midline posteriorly, related to the recent injury. No skull fracture. Traumatic Brain Injury Risk Stratification ----- Skull Fracture: No (Low  - mBIG 1) Subdural Hematoma (SDH): 5 mm (mBIG 2) Subarachnoid Hemorrhage [facility]): Localized, unilateral (Moderate - mBIG 2) Epidural Hematoma (EDH): No (Low - mBIG 1) Cerebral contusion, intra-axial, intraparenchymal Hemorrhage (IPH): No Intraventricular Hemorrhage (IVH): No (Low - mBIG 1) Midline Shift > 1mm or Edema/effacement of sulci/vents: No (Low - mBIG 1) IMPRESSION: 1. Subdural hematomas along the falx eccentric to the right (5 mm) and along the right tentorium (up to 4 mm), with associated subarachnoid hemorrhage along the mesial right temporal lobe posteriorly, without significant mass effect or midline shift. 2. Mild midline posterior scalp hematoma. 3. TBI Risk Stratification is Moderate - mBIG 2. The findings of this exam were called to Dr. Nicholaus at the time of exam interpretation. Electronically signed by: oneil devonshire MD 03/01/2024 06:12 PM EST RP Workstation: [id number]    Assessment/Plan   1. Traumatic brain injury  - Presents after unwitnessed fall and found to have SDH and SAH  - He is at his neurologic baseline per group home personnel and his brother - No change on repeat CT - Management will be non-surgical per neurosurgery who recommends keeping him normotensive  - Brain bleed location puts him at high risk for seizures and neurology is consulting, plan to continue lamotrigine  and clonazepam  at his usual doses for now    2. Hypothermia   - He was cultured and given a dose of broad-spectrum antibiotic in ED but no other infectious signs/symptoms noted, could be from the [facility]  - Continue warming blankets until core temp >36 C, treat hypoglycemia, check TSH and cortisol, follow cultures   3. Hypoglycemia  - Mild hypoglycemia noted in ED and treated with IV dextrose   - Add dextrose  to IVF for now, check sugars q4h and as-needed for now    4. Hypertension  - He had a couple SBP readings in the 80s in ED, normalized with IVF  - Hold scheduled antihypertensives for now and treat  as-needed    5. AKI superimposed on CKD 3A; hyperkalemia  - Continue IVF hydration, renally-dose medications, repeat chem panel in am    6. Seizure disorder  - Continue lamotrigine  and clonazepam     7. Developmental delay; depression; anxiety  - Follow-up on pharmacy medication-reconciliation, use delirium precautions in hospital    8. Renal mass  - Undergoing workup through [facility]   9. MGUS; anemia; thrombocytopenia  - Hgb appears stable, platelet count lower than recent priors; no  active bleeding  - Repeat CBC in am     DVT prophylaxis: SCDs  Code Status: DNR/DNI Level of Care: Level of care: Progressive Family Communication: Brother updated from ED  Disposition Plan:  Patient is from: Group home  Anticipated d/c is to: Group home Anticipated d/c date is: Possibly as early as 03/03/24 Patient currently: Pending resolution of hypothermia, clinical stability  Consults called: Neurology  Admission status: Observation     Evalene GORMAN Sprinkles, MD [facility]  03/02/2024, 3:13 AM       [1]  Allergies Allergen Reactions   Dilantin [Phenytoin]     Unknown reaction   Keppra  [Levetiracetam ]     Unknown reaction    Lisinopril     Unknown reaction    "

## 2024-03-02 NOTE — Progress Notes (Signed)
 Same-day progress note  Patient seen earlier in the morning by Dr. Michaela. Exam remains stable from the last exam. No further reported seizure  Specific question from hospitalist Dr. 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  He has requested a formal heme-onc consult but was wondering if lamotrigine  could be substituted for another AED.  In discussion with our epileptologist, I would recommend switching lamotrigine  to Vimpat .  I would recommend Vimpat  200 mg IV x 1 loading dose followed by Vimpat  100 mg twice daily.  Will follow  -- Eligio Lav, MD Neurology

## 2024-03-02 NOTE — ED Triage Notes (Signed)
 Pt arrives by [facility] from [facility] following evaluation for an unwitnessed fall, striking his head. Pt has a conformed subdural bleed and a L orbital fracture. Pt is developmentally delayed at baseline and arrives at his normal orientation. Per [facility] Pt was hypothermic with a rectal temp of 92 and a CBG of 66. Pt arrives with a cbg of 66.

## 2024-03-02 NOTE — ED Notes (Signed)
 Pt in bed, pt eyes closed, resps even and unlabored, pt arouses easily to verbal stim, pt oriented times three, pt then return to pre aroused state

## 2024-03-02 NOTE — ED Provider Notes (Addendum)
 " [facility]-EMERGENCY DEPT [facility] Emergency Department Provider Note MRN:  [id number]  Arrival date & time: 03/02/24     Chief Complaint   Head Injury   History of Present Illness   Frank Conley is a 64 y.o. year-old male presents to the ED with chief complaint of subdural hematoma.  Patient transferred from [facility].  Patient had a fall earlier this evening that was unwitnessed at his group home.  Neurosurgery, neurology, and PCCM were all consulted at [facility].  Patient was transferred to ED to ED due to lack of EEG at [facility] this weekend.  At present, patient states that he is hungry.  He states that he otherwise feels okay.  History provided by patient.   Review of Systems  Pertinent positive and negative review of systems noted in HPI.    Physical Exam  There were no vitals filed for this visit.  CONSTITUTIONAL:  non toxic-appearing, NAD NEURO:  Alert and oriented x 3, CN 3-12 grossly intact, answers questions appropriately EYES:  eyes equal and reactive ENT/NECK:  Supple, no stridor  CARDIO:  normal rate, appears well-perfused  PULM:  No respiratory distress,  GI/GU:  non-distended,  MSK/SPINE:  No gross deformities, no edema, moves all extremities  SKIN:  no rash, atraumatic   *Additional and/or pertinent findings included in MDM below  Diagnostic and Interventional Summary    EKG Interpretation Date/Time:    Ventricular Rate:    PR Interval:    QRS Duration:    QT Interval:    QTC Calculation:   R Axis:      Text Interpretation:         Labs Reviewed  CBG MONITORING, ED - Abnormal; Notable for the following components:      Result Value   Glucose-Capillary 66 (*)    All other components within normal limits    No orders to display    Medications  dextrose  50 % solution 25 mL (has no administration in time range)     Procedures  /  Critical Care Procedures  ED Course and Medical Decision Making  I have reviewed the triage vital signs, the  nursing notes, and pertinent available records from the EMR.  Social Determinants Affecting Complexity of Care: Patient has no clinically significant social determinants affecting this chief complaint..   ED Course:    Medical Decision Making Patient transferred for higher level of care and potential need for EEG.    Appears stable.  Sitting up in bed and asking for a PB and J and waffles in the morning.    Amount and/or Complexity of Data Reviewed Labs: ordered.  Risk Prescription drug management. Decision regarding hospitalization.         Consultants: I consulted with PCCM, Dr. ?Clark?, who states that she knows about the patient and will come to admit.  Appreciate her help.  PCCM called back and states that because patient does not need continuous EEG, he can be admitted to medicine.  2:51 AM  I consulted with Dr. Charlton, who is appreciated for admitting.   Treatment and Plan: Patient's exam and diagnostic results are concerning for subdural hematoma.  Feel that patient will need admission to the hospital for further treatment and evaluation.    Final Clinical Impressions(s) / ED Diagnoses     ICD-10-CM   1. Injury of head, initial encounter  S09.90XA     2. Subdural hematoma (HCC)  S06.[name]       ED Discharge Orders  None         Discharge Instructions Discussed with and Provided to Patient:   Discharge Instructions   None      Vicky Charleston, PA-C 03/02/24 0141    Vicky Charleston, PA-C 03/02/24 0251    [name], MD 03/02/24 [phone]  "

## 2024-03-02 NOTE — Consult Note (Addendum)
 NEUROLOGY CONSULT NOTE   Date of service: March 02, 2024 [name] MRN:  [id number] DOB:  January 12, 1961 Chief Complaint: Seizures Requesting Provider: [name], MD  History of Present Illness  Frank Conley is a 64 y.o. male with hx of developmental delay, sick sinus syndrome status post pacemaker, hypertension, drug-induced parkinsonism, MGUS, seizure disorder.   He has a longstanding history of seizures that been treated with lamotrigine .  He is currently on 150 mg every morning and 175 mg every afternoon.   Today, he had an unwitnessed fall, no definite seizure activity.  He was brought in via EMS and found to have a parafalcine subdural hematoma as well as a hematoma layering along the tentorium adjacent to the temporal lobe.   Given his history of epilepsy and location of the hemorrhage, there is concern that he may develop worsening seizures over the weekend, and with inability to even do a spot EEG at [facility] this weekend, the decision was made to transfer him to [facility].   Of note, the patient does tell me that he has recently been doing cocaine, but I am highly doubtful of the veracity of this.  He states that he has been getting it from both his physician as well as his dentist.   Past History   Past Medical History:  Diagnosis Date   Depression    Hypertension    MGUS (monoclonal gammopathy of unknown significance)    Pervasive developmental disorder    Seizures (HCC)    SSS (sick sinus syndrome) (HCC)    PPM placed    Past Surgical History:  Procedure Laterality Date   PACEMAKER IMPLANT      Family History: Family History  Family history unknown: Yes    Social History  reports that he has never smoked. He has never used smokeless tobacco. He reports that he does not drink alcohol and does not use drugs.  Allergies[1]  Medications  Current Medications[2]  Vitals   Vitals:   03/02/24 0131  Weight: 81.6 kg  Height: 5'  8 (1.727 m)    Body mass index is 27.35 kg/m.   Physical Exam   Constitutional: Appears well-developed and well-nourished.   Neurologic Examination    Neuro: Mental Status: Patient is awake, alert, oriented to person, place, month, he is able to give me the year he is dysarthric.   Patient is able to give a clear and coherent history. No signs of aphasia or neglect Cranial Nerves: II: Visual Fields are full. Pupils are equal, round, and reactive to light.   III,IV, VI: EOMI without ptosis or diploplia, he has markedly saccadic smooth pursuit V: Facial sensation is symmetric to temperature VII: Facial movement is asymmetric, lower on the right and left VIII: hearing is intact to voice X: Uvula elevates symmetrically XII: tongue is midline without atrophy or fasciculations.  Motor: He is able to hold both arms against drift, he does not lift his legs high, but does have good confrontational strength Sensory: Endorses symmetric sensation        Labs/Imaging/Neurodiagnostic studies   CBC:  Recent Labs  Lab 2024/03/22 1821  WBC 4.9  NEUTROABS 3.9  HGB 8.4*  HCT 26.6*  MCV 101.1*  PLT 55*   Basic Metabolic Panel:  Lab Results  Component Value Date   NA 139 Mar 22, 2024   K 5.8 (H) 03/22/2024   CO2 21 (L) 03-22-2024   GLUCOSE 112 (H) 03-22-2024   BUN 56 (H) 03-22-2024  CREATININE 2.28 (H) 03/01/2024   CALCIUM  8.4 (L) 03/01/2024   GFRNONAA 32 (L) 03/01/2024   GFRAA >60 12/24/2018   Lipid Panel:  Lab Results  Component Value Date   LDLCALC 34 11/23/2023   HgbA1c:  Lab Results  Component Value Date   HGBA1C 4.9 11/23/2023   Urine Drug Screen:     Component Value Date/Time   LABOPIA NONE DETECTED 12/17/2022 1530   COCAINSCRNUR NONE DETECTED 12/17/2022 1530   LABBENZ POSITIVE (A) 12/17/2022 1530   AMPHETMU NONE DETECTED 12/17/2022 1530   THCU NONE DETECTED 12/17/2022 1530   LABBARB NONE DETECTED 12/17/2022 1530    Alcohol Level No results found  for: [facility] INR  Lab Results  Component Value Date   INR 1.1 03/01/2024   APTT  Lab Results  Component Value Date   APTT 41 (H) 03/01/2024    CT Head without contrast(Personally reviewed): Subdural it is parafalcine as well as along the tentorium  ASSESSMENT  Frank Conley is a 64 y.o. male with history of seizure disorder who presents after a fall to which he is amnestic with subdural hematoma.  The fall was unwitnessed and there is no clear postictal state, but the fact that he is amnestic to it does suggest that it could have been a seizure.  I am not certain that I would make changes to his antiepileptics based on this alone, however with his subdural hematoma I initially advised a short course of Keppra  to help raise his seizure threshold, however especially given that he did not definitely have a seizure, and he has had adverse reactions to Keppra  in the past, I think it would be reasonable to continue him just on his home dose of lamotrigine .  If he were to have any further events of concern, could consider EEG monitoring, but for now would monitor clinically.   RECOMMENDATIONS  Continue home lamotrigine  150 every morning and 175 every afternoon If any further events of concern, consider continuous EEG monitoring. Continue home clonazepam  0.5 mg 3 times daily UDS, though I doubt he has been doing cocaine Neurology will follow ______________________________________________________________________    Signed, Aisha Seals, MD [facility]     [1]  Allergies Allergen Reactions   Dilantin [Phenytoin]     Unknown reaction   Keppra  [Levetiracetam ]     Unknown reaction    Lisinopril     Unknown reaction   [2]  Current Facility-Administered Medications:    dextrose  50 % solution 25 mL, 25 mL, Intravenous, Once, [name], MD  Current Outpatient Medications:    acetaminophen  (TYLENOL ) 325 MG tablet, Take 650 mg by mouth every 6 (six) hours as  needed for mild pain (pain score 1-3)., Disp: , Rfl:    aspirin  EC 81 MG tablet, TAKE 1 TABLET BY MOUTH ONCE DAILY SWALLOW WHOLE, Disp: , Rfl:    azithromycin  (ZITHROMAX ) 250 MG tablet, One tab po daily for three days, Disp: 3 each, Rfl: 0   clonazePAM  (KLONOPIN ) 0.5 MG disintegrating tablet, Take 0.5 mg by mouth 3 (three) times daily., Disp: , Rfl:    clonazePAM  (KLONOPIN ) 0.5 MG tablet, Take 0.5 mg by mouth 2 (two) times daily., Disp: , Rfl:    cloNIDine  (CATAPRES  - DOSED IN MG/24 HR) 0.2 mg/24hr patch, Place 0.2 mg onto the skin once a week. Thursday, Disp: , Rfl:    cyanocobalamin  (VITAMIN B12) 1000 MCG tablet, Take 1,000 mcg by mouth daily., Disp: , Rfl:    dextromethorphan-guaiFENesin  (MUCINEX  DM) 30-600 MG 12hr  tablet, Take 1 tablet by mouth 2 (two) times daily as needed for cough., Disp: 30 tablet, Rfl: 0   ferrous sulfate  325 (65 FE) MG tablet, Take 325 mg by mouth daily., Disp: , Rfl:    guaiFENesin  (MUCINEX ) 600 MG 12 hr tablet, Take 1 tablet by mouth 2 (two) times daily as needed for cough., Disp: , Rfl:    ibuprofen  (ADVIL ) 200 MG tablet, Take 200 mg by mouth every 6 (six) hours as needed for fever., Disp: , Rfl:    ketoconazole (NIZORAL) 2 % cream, Apply 1 Application topically daily as needed for irritation., Disp: , Rfl:    lamoTRIgine  (LAMICTAL ) 100 MG tablet, Take 100 mg by mouth 2 (two) times daily., Disp: , Rfl:    lamoTRIgine  (LAMICTAL ) 25 MG tablet, Take 50-75 mg by mouth See admin instructions. Take in addition to 100 mg tablet for a total morning dose of 150mg  and bedtime dose of 175mg , Disp: , Rfl:    loperamide (IMODIUM A-D) 2 MG tablet, Take 2 mg by mouth 4 (four) times daily as needed for diarrhea or loose stools., Disp: , Rfl:    loratadine  (CLARITIN ) 10 MG tablet, Take 10 mg by mouth daily., Disp: , Rfl:    Melatonin 3 MG TABS, Take 3 mg by mouth every evening., Disp: , Rfl:    Multiple Vitamins-Minerals (PRESERVISION AREDS 2) CAPS, Take 1 capsule by mouth 2 (two) times  daily., Disp: , Rfl:    Omega-3 Fatty Acids (FISH OIL) 1000 MG CAPS, Take 2,000 mg by mouth daily., Disp: , Rfl:    polyethylene glycol (MIRALAX  / GLYCOLAX ) 17 g packet, Take 17 g by mouth in the morning, at noon, and at bedtime., Disp: , Rfl:    QUEtiapine  (SEROQUEL ) 100 MG tablet, Take 100-150 mg by mouth See admin instructions. Take 1 tablet by mouth twice a day then 1.5 tablets at bedtime, Disp: , Rfl:    rosuvastatin  (CRESTOR ) 20 MG tablet, Take 1 tablet (20 mg total) by mouth daily., Disp: 90 tablet, Rfl: 0   senna (SENOKOT) 8.6 MG TABS tablet, Take 2 tablets by mouth at bedtime. (Patient taking differently: Take 2 tablets by mouth daily as needed.), Disp: , Rfl:

## 2024-03-02 NOTE — Progress Notes (Signed)
 "   [name] FMW:[id number] DOB: 05-Oct-1960      Brief hospital course: 64 y.o. M with developmental delay, lives in group home, renal cancer, pending treatment, MGUS follows at [facility], hx seizures, dCHF, SSS s/p PPM, CKD IIIa baseline 1.5, HTN, traumatic SDH last Nov 2025, and TIA who presented to [facility] with unwitnessed fall.  In the ER, mentation at baseline but CT showed large SDH and small SAH.    Neurosurgery recommended no intervention but due to location of bleed, high risk for seizure, so transferred to [facility] for Neurology evaluation.    Assessment and plan: Right parafalcine SDH Right temporal SAH Had a small right supratentorial SDH last fall.  On follow up in Dec, repeat CT showed this had resolved. Gait has been unstable since then, he fell again at the time of admission, and CT showed large SDH now, same position.  Also small SAH along right temporal lobe.  Mentation normal.  In context of platelets down to 50K.  Repeat CT head stable.  Discussed with NSGY - Transfuse platelets if <50K - Neuro checks q4h   Hypothermia Hypoglycemia Cortisol 11, sort of indeterminate.  TSH normal.  CXR clear.  I have low suspicion for infection but will continue empiric antibiotics.  No liver disease.  I doubt he's eaten much in the ER.   - Dextrose  fluids - Frequent CBGs - Warming blanket - Follow blood and urine cultures - Continue empiric antibiotics for now   Thrombocytopenia Anemia MGUS Old Heme-Onc notes from [facility] from 2017 and 2025 reviewed.  Had anemia and thrombocytopenia in that time period that was judged to be medication related from Lamictal  and Seroquel  (SPEP showed moderate spike only).  Those medications were continued though, and he seems not to have had any problems until now (SPEP in 2024 stable, SPEP in 2025 stable, FLCs elevated due to renal failure)  Review of Hgb shows this was trending down to 9 g/dL during 7974.  Now acutely down to 7.5  Platelets  were ~140s, until this month (down to 80K Jan 3, down to 50K now) Discussed with Neurology - Stop Lamictal  - Change to Vimpat   - Appreciate Hematology input - Trend CBC - Transfuse platelets for <50K (given bleed) - SPEP, FLCs - B12, folate - Ferritin   AKI on CKD IIIa Hyperkalemia Cr 1.5 during last year.  After admission in early Jan discharge Cr was 1.7, improved to 1.36 at hospital follow up  Cr 2.3 on admission here, down to 2.0 overnight with fluids - Continue IV fluids - Avoid hypotension, nephrotoxins   Recent pneumonia Admitted 3 weeks PTA for pneumonia to [facility]  CXR now read as left base atelectasis, no clear infiltrate.  Seizure disorder Unclear if he had an unwitnessed seizure prior to admission. - Stop Lamictal  - EEG if change in mentation or seizure observed - Start Vimpat  -Continue clonazepam   SSS s/p PPM Not on rate control medication at baseline - Hold clonidine   HTN Blood pressure soft - Hold clonidine   Developmental delay OCD -Continue home clonazepam , Seroquel    This is a no charge note, for further details, please see the H&P by my partner, Dr. Charlton from earlier today.   Principal Problem:   TBI (traumatic brain injury) (HCC) Active Problems:   Acute kidney injury superimposed on CKD   Developmental delay disorder   Essential hypertension   Seizure disorder (HCC)   MGUS (monoclonal gammopathy of unknown significance)   Renal mass   Major  depressive disorder, single episode, severe, with psychotic behavior (HCC)   Thrombocytopenia   Subdural hematoma (HCC)           Physical Exam: BP (!) 112/59 (BP Location: Right Arm)   Pulse 72   Temp 98.7 F (37.1 C) (Oral)   Resp 16   Ht 5' 8 (1.727 m)   Wt 81.6 kg   SpO2 99%   BMI 27.35 kg/m   Patient seen and examined.             Author: Lonni SHAUNNA Dalton, MD 03/02/2024 9:50 AM    "

## 2024-03-02 NOTE — ED Notes (Signed)
 Pt in bed with eyes closed, pt arouses easily to verbal stim.  Pupils are 4mm and reactive. Pt oriented to person and place, doesn't know day of the week.

## 2024-03-02 NOTE — ED Notes (Addendum)
 Pt is awake and answering questions, pt oriented to self and place, doesn't know day of the week, re oriented pt.    Pt sugar was 60, 4oz of juice given, md notified, will recheck in about 15 minutes  Pt coughed after po juice, states that he choked a little bit, pt 97% O2 sat, resps even and unlabored

## 2024-03-02 NOTE — ED Notes (Signed)
 ..  EMTALA: REQUIRED DOCUMENTATION COMPLETED AND REVIEWED BY WRITER PRIOR TO PT TRANSFER MD REASSESSMENT EMTALA RN SECTION TRANSFER E-SIGN VS WITHIN REQUIRED TIME

## 2024-03-02 NOTE — Sepsis Progress Note (Signed)
 Elink following for sepsis protocol.

## 2024-03-02 NOTE — Progress Notes (Signed)
 Pharmacy Antibiotic Note  Frank Conley is a 64 y.o. male admitted on 03/02/2024 with concern for sepsis.  Pharmacy has been consulted for vancomycin  dosing.  Vancomycin  1500 mg IV x 1 given in ED  Plan: Vancomycin  1g IV q 24h (eAUC 483) Monitor renal function, Cx and clinical progression to narrow Vancomycin  levels as indicated  Height: 5' 8 (172.7 cm) Weight: 81.6 kg (179 lb 14.3 oz) IBW/kg (Calculated) : 68.4  Temp (24hrs), Avg:93.6 F (34.2 C), Min:91.4 F (33 C), Max:98.7 F (37.1 C)  Recent Labs  Lab 03/01/24 1821 03/02/24 0421  WBC 4.9 4.2  CREATININE 2.28* 2.02*  LATICACIDVEN 0.9  --     Estimated Creatinine Clearance: 37.2 mL/min (A) (by C-G formula based on SCr of 2.02 mg/dL (H)).    Allergies[1]  Dorn Poot, PharmD, [facility] Clinical Pharmacist ED Pharmacist Phone # [phone] 03/02/2024 9:50 AM      [1]  Allergies Allergen Reactions   Dilantin [Phenytoin]     Unknown reaction   Keppra  [Levetiracetam ]     Unknown reaction    Lisinopril     Unknown reaction

## 2024-03-02 NOTE — Care Management Obs Status (Signed)
 MEDICARE OBSERVATION STATUS NOTIFICATION   Patient Details  Name: RUI WORDELL MRN: [id number] Date of Birth: [birth year]   Medicare Observation Status Notification Given:  Yes    Corean JAYSON Canary, RN 03/02/2024, 2:59 PM

## 2024-03-02 NOTE — Hospital Course (Addendum)
 64 y.o. M with developmental delay, lives in group home, renal cancer, pending treatment, MGUS follows at [facility], hx seizures, dCHF, SSS s/p PPM, CKD IIIa baseline 1.5, HTN, traumatic SDH last Nov 2025, and TIA who presented to [facility] with unwitnessed fall.  In the ER, mentation at baseline but CT showed large SDH and small SAH.    Neurosurgery recommended no intervention but due to location of bleed, high risk for seizure, so transferred to [facility] for Neurology evaluation.     SDH SAH Had a small right supratentorial SDH last fall.  On follow up in Dec, repeat CT showed this had resolved. Gait has been unstable since then, he fell again at the time of admission, and CT showed large SDH now, same position.  Also small SAH along right temporal lobe.  Mentation normal.  In context of platelets down to 50K.   Hypothermia Hypoglycemia Cortisol 11, sort of indeterminate.  TSH normal.  CXR clear.  I have low suspicion for infection but will continue empiric antibiotics.  No liver disease.   - Dextrose  fluids - Frequent CBGs - Warming blanket - Follow blood and urine cultures   Thrombocytopenia Anemia MGUS Old Heme-Onc notes from [facility] from 2017 and 2025 reviewed. - Consult Hematology - Trend CBC   AKI on CKD IIIa Hyperkalemia   Seizure disorder   SSS s/p PPM  HTN  Developmental delay OCD

## 2024-03-02 NOTE — ED Notes (Signed)
 D/c bear hugger

## 2024-03-02 NOTE — ED Notes (Signed)
 Pt arrives with coban covering the area to the back of his head following a lac repair at [facility].

## 2024-03-02 NOTE — ED Notes (Signed)
 Pt's guardian called and given update about pt's transfer to [facility]

## 2024-03-03 ENCOUNTER — Encounter (HOSPITAL_COMMUNITY): Payer: Self-pay | Admitting: Family Medicine

## 2024-03-03 DIAGNOSIS — N179 Acute kidney failure, unspecified: Secondary | ICD-10-CM | POA: Diagnosis present

## 2024-03-03 DIAGNOSIS — R1312 Dysphagia, oropharyngeal phase: Secondary | ICD-10-CM | POA: Diagnosis present

## 2024-03-03 DIAGNOSIS — S0101XA Laceration without foreign body of scalp, initial encounter: Secondary | ICD-10-CM | POA: Diagnosis present

## 2024-03-03 DIAGNOSIS — D61818 Other pancytopenia: Secondary | ICD-10-CM

## 2024-03-03 DIAGNOSIS — R402252 Coma scale, best verbal response, oriented, at arrival to emergency department: Secondary | ICD-10-CM | POA: Diagnosis present

## 2024-03-03 DIAGNOSIS — Z1152 Encounter for screening for COVID-19: Secondary | ICD-10-CM | POA: Diagnosis not present

## 2024-03-03 DIAGNOSIS — I495 Sick sinus syndrome: Secondary | ICD-10-CM | POA: Diagnosis present

## 2024-03-03 DIAGNOSIS — F849 Pervasive developmental disorder, unspecified: Secondary | ICD-10-CM | POA: Diagnosis present

## 2024-03-03 DIAGNOSIS — I129 Hypertensive chronic kidney disease with stage 1 through stage 4 chronic kidney disease, or unspecified chronic kidney disease: Secondary | ICD-10-CM

## 2024-03-03 DIAGNOSIS — D649 Anemia, unspecified: Secondary | ICD-10-CM

## 2024-03-03 DIAGNOSIS — R402142 Coma scale, eyes open, spontaneous, at arrival to emergency department: Secondary | ICD-10-CM | POA: Diagnosis present

## 2024-03-03 DIAGNOSIS — G2119 Other drug induced secondary parkinsonism: Secondary | ICD-10-CM | POA: Diagnosis present

## 2024-03-03 DIAGNOSIS — J9811 Atelectasis: Secondary | ICD-10-CM | POA: Diagnosis present

## 2024-03-03 DIAGNOSIS — I5032 Chronic diastolic (congestive) heart failure: Secondary | ICD-10-CM | POA: Diagnosis present

## 2024-03-03 DIAGNOSIS — J189 Pneumonia, unspecified organism: Secondary | ICD-10-CM | POA: Diagnosis present

## 2024-03-03 DIAGNOSIS — Z66 Do not resuscitate: Secondary | ICD-10-CM | POA: Diagnosis present

## 2024-03-03 DIAGNOSIS — A419 Sepsis, unspecified organism: Secondary | ICD-10-CM | POA: Diagnosis present

## 2024-03-03 DIAGNOSIS — R402362 Coma scale, best motor response, obeys commands, at arrival to emergency department: Secondary | ICD-10-CM | POA: Diagnosis present

## 2024-03-03 DIAGNOSIS — S065XAA Traumatic subdural hemorrhage with loss of consciousness status unknown, initial encounter: Secondary | ICD-10-CM | POA: Diagnosis present

## 2024-03-03 DIAGNOSIS — W19XXXA Unspecified fall, initial encounter: Secondary | ICD-10-CM | POA: Diagnosis present

## 2024-03-03 DIAGNOSIS — D696 Thrombocytopenia, unspecified: Secondary | ICD-10-CM | POA: Diagnosis not present

## 2024-03-03 DIAGNOSIS — I130 Hypertensive heart and chronic kidney disease with heart failure and stage 1 through stage 4 chronic kidney disease, or unspecified chronic kidney disease: Secondary | ICD-10-CM | POA: Diagnosis present

## 2024-03-03 DIAGNOSIS — S066XAA Traumatic subarachnoid hemorrhage with loss of consciousness status unknown, initial encounter: Secondary | ICD-10-CM | POA: Diagnosis present

## 2024-03-03 DIAGNOSIS — D631 Anemia in chronic kidney disease: Secondary | ICD-10-CM | POA: Diagnosis present

## 2024-03-03 DIAGNOSIS — E875 Hyperkalemia: Secondary | ICD-10-CM | POA: Diagnosis present

## 2024-03-03 DIAGNOSIS — G40909 Epilepsy, unspecified, not intractable, without status epilepticus: Secondary | ICD-10-CM | POA: Diagnosis present

## 2024-03-03 DIAGNOSIS — F323 Major depressive disorder, single episode, severe with psychotic features: Secondary | ICD-10-CM | POA: Diagnosis present

## 2024-03-03 DIAGNOSIS — N1831 Chronic kidney disease, stage 3a: Secondary | ICD-10-CM

## 2024-03-03 DIAGNOSIS — E860 Dehydration: Secondary | ICD-10-CM | POA: Diagnosis present

## 2024-03-03 LAB — COMPREHENSIVE METABOLIC PANEL WITH GFR
ALT: 30 U/L (ref 0–44)
AST: 45 U/L — ABNORMAL HIGH (ref 15–41)
Albumin: 3.1 g/dL — ABNORMAL LOW (ref 3.5–5.0)
Alkaline Phosphatase: 118 U/L (ref 38–126)
Anion gap: 9 (ref 5–15)
BUN: 40 mg/dL — ABNORMAL HIGH (ref 8–23)
CO2: 20 mmol/L — ABNORMAL LOW (ref 22–32)
Calcium: 8.2 mg/dL — ABNORMAL LOW (ref 8.9–10.3)
Chloride: 112 mmol/L — ABNORMAL HIGH (ref 98–111)
Creatinine, Ser: 2.05 mg/dL — ABNORMAL HIGH (ref 0.61–1.24)
GFR, Estimated: 36 mL/min — ABNORMAL LOW
Glucose, Bld: 97 mg/dL (ref 70–99)
Potassium: 4.5 mmol/L (ref 3.5–5.1)
Sodium: 141 mmol/L (ref 135–145)
Total Bilirubin: 0.4 mg/dL (ref 0.0–1.2)
Total Protein: 5.6 g/dL — ABNORMAL LOW (ref 6.5–8.1)

## 2024-03-03 LAB — GLUCOSE, CAPILLARY: Glucose-Capillary: 75 mg/dL (ref 70–99)

## 2024-03-03 LAB — CBG MONITORING, ED
Glucose-Capillary: 66 mg/dL — ABNORMAL LOW (ref 70–99)
Glucose-Capillary: 74 mg/dL (ref 70–99)
Glucose-Capillary: 79 mg/dL (ref 70–99)
Glucose-Capillary: 159 mg/dL — ABNORMAL HIGH (ref 70–99)

## 2024-03-03 LAB — CBC
HCT: 24 % — ABNORMAL LOW (ref 39.0–52.0)
Hemoglobin: 7.6 g/dL — ABNORMAL LOW (ref 13.0–17.0)
MCH: 31.8 pg (ref 26.0–34.0)
MCHC: 31.7 g/dL (ref 30.0–36.0)
MCV: 100.4 fL — ABNORMAL HIGH (ref 80.0–100.0)
Platelets: 52 10*3/uL — ABNORMAL LOW (ref 150–400)
RBC: 2.39 MIL/uL — ABNORMAL LOW (ref 4.22–5.81)
RDW: 17.8 % — ABNORMAL HIGH (ref 11.5–15.5)
WBC: 5.5 10*3/uL (ref 4.0–10.5)
nRBC: 0 % (ref 0.0–0.2)

## 2024-03-03 NOTE — Progress Notes (Signed)
 NEUROLOGY CONSULT FOLLOW UP NOTE   Date of service: March 03, 2024 [name] MRN:  [id number] DOB:  Dec 03, 1960  Interval Hx/subjective  Seen and examined.  No overnight events.  Does not complain of headache  Vitals   Vitals:   03/03/24 0115 03/03/24 0315 03/03/24 0530 03/03/24 0630  BP: (!) 160/80 (!) 162/77 (!) 174/90 (!) 153/84  Pulse: 66 70 76 72  Resp: 14 13 18 13   Temp: (!) 96.5 F (35.8 C) (!) 97 F (36.1 C) 97.7 F (36.5 C) 97.7 F (36.5 C)  TempSrc:      SpO2: 100% 98% 100% 100%  Weight:      Height:         Body mass index is 27.35 kg/m.  Physical Exam  General: Well-developed well-nourished man in no acute distress, sitting somewhat awkwardly in bed HEENT: Head bandaged CVS: Regular rhythm Respiratory: Breathing well saturating normally on room air Neurological exam He is awake alert oriented x 2 His speech is moderately dysarthric He has poor attention concentration Cranial nerves II to XII appear grossly intact Motor examination: Antigravity strength in all 4 extremities Sensation appears intact to light touch all over Coordination examination not formally assessed   Medications Current Medications[1]  Labs and Diagnostic Imaging   CBC:  Recent Labs  Lab 03/01/24 1821 03/02/24 0421 03/02/24 1208 03/02/24 1722 03/03/24 0207  WBC 4.9   < > 4.1  --  5.5  NEUTROABS 3.9  --  3.3  --   --   HGB 8.4*   < > 7.1* 7.4* 7.6*  HCT 26.6*   < > 22.1* 24.2* 24.0*  MCV 101.1*   < > 99.5  --  100.4*  PLT 55*   < > 51*  --  52*   < > = values in this interval not displayed.    Basic Metabolic Panel:  Lab Results  Component Value Date   NA 141 03/03/2024   K 4.5 03/03/2024   CO2 20 (L) 03/03/2024   GLUCOSE 97 03/03/2024   BUN 40 (H) 03/03/2024   CREATININE 2.05 (H) 03/03/2024   CALCIUM  8.2 (L) 03/03/2024   GFRNONAA 36 (L) 03/03/2024   GFRAA >60 12/24/2018   Lipid Panel:  Lab Results  Component Value Date   LDLCALC 34  11/23/2023   HgbA1c:  Lab Results  Component Value Date   HGBA1C 4.9 11/23/2023   Urine Drug Screen:     Component Value Date/Time   LABOPIA NEGATIVE 03/02/2024 1438   COCAINSCRNUR NEGATIVE 03/02/2024 1438   LABBENZ POSITIVE (A) 03/02/2024 1438   AMPHETMU NEGATIVE 03/02/2024 1438   THCU NEGATIVE 03/02/2024 1438   LABBARB NEGATIVE 03/02/2024 1438     INR  Lab Results  Component Value Date   INR 1.1 03/01/2024   APTT  Lab Results  Component Value Date   APTT 41 (H) 03/01/2024    CT Head without contrast(Personally reviewed): 03/01/2024-subdural hematomas along falx eccentric to the right and along the right tentorium with associated subarachnoid hemorrhage along the mesial temporal lobe posteriorly without significant mass effect or midline shift.  Mid posterior scalp hematoma. 03/01/2024-repeat head CT-stable  Assessment   [name] is a 64 y.o. male past history of developmental delay, sick sinus syndrome, status post pacemaker, hypertension, drug-induced parkinsonism, MGUS and seizure disorder brought in after an unwitnessed fall-no definitive seizure activity.  Was found to have a parafalcine subdural hematoma as well as hematoma along the tentorium adjacent to the  temporal lobe. Was transferred to [facility] with the concern that the location around the tentorium adjacent to the temporal lobe might predispose him for more seizures and he might need interventions which may not be available at the outlying hospital. He has remained stable throughout his stay at this hospital. Repeat head imaging has been stable Case was discussed with hospitalist yesterday-she was on lamotrigine  at home.  He has pancytopenia.  There had been considerations for switching his lamotrigine  to another antiepileptic but that had not been done. At this time, with his pancytopenia and lamotrigine  possibly having rare side effect of bone marrow suppression, decision was made to change his  antiepileptic to Vimpat .  Impression: Traumatic subdural hematoma History of seizures Pancytopenia  Recommendations  He appears stable from a clinical standpoint Stat CT head if any neurochanges observed As for his antiepileptic treatment-continue him on Vimpat  100 twice daily. He has had side effects from Keppra  and Depakote, and now consideration of pancytopenia from lamotrigine -which is the reason why lamotrigine  was discontinued during this admission. Maintain seizure precautions He should follow-up with his outpatient neurologist in 6 to 8 weeks after discharge. Management of pancytopenia per primary team as you are  Inpatient neurology will be available as needed Please call with questions  Plan relayed to Dr. Jonel. ______________________________________________________________________   Signed, Eligio Lav, MD [facility]     [1]  Current Facility-Administered Medications:    acetaminophen  (TYLENOL ) tablet 650 mg, 650 mg, Oral, Q6H PRN **OR** acetaminophen  (TYLENOL ) suppository 650 mg, 650 mg, Rectal, Q6H PRN, Opyd, Timothy S, MD   ceFEPIme  (MAXIPIME ) 2 g in sodium chloride  0.9 % 100 mL IVPB, 2 g, Intravenous, Q12H, Gaines Carrier, RPH, Stopped at 03/03/24 0345   clonazePAM  (KLONOPIN ) tablet 0.5 mg, 0.5 mg, Oral, TID, Opyd, Timothy S, MD, 0.5 mg at 03/02/24 2151   cyanocobalamin  (VITAMIN B12) tablet 1,000 mcg, 1,000 mcg, Oral, Daily, Danford, Lonni SQUIBB, MD, 1,000 mcg at 03/02/24 1003   dextrose  10 % infusion, , Intravenous, Continuous, Danford, Lonni SQUIBB, MD, Last Rate: 50 mL/hr at 03/03/24 0452, New Bag at 03/03/24 0452   fentaNYL  (SUBLIMAZE ) injection 12.5-50 mcg, 12.5-50 mcg, Intravenous, Q2H PRN, Opyd, Evalene RAMAN, MD   ferrous sulfate  tablet 325 mg, 325 mg, Oral, Daily, Danford, Lonni SQUIBB, MD, 325 mg at 03/02/24 1003   hydrALAZINE  (APRESOLINE ) tablet 10 mg, 10 mg, Oral, Q6H PRN, Opyd, Timothy S, MD   lacosamide  (VIMPAT ) tablet 100 mg, 100  mg, Oral, BID, Dhruv Christina, MD, 100 mg at 03/02/24 2151   magnesium  oxide (MAG-OX) tablet 400 mg, 400 mg, Oral, QHS, Danford, Lonni SQUIBB, MD, 400 mg at 03/02/24 2151   oxyCODONE  (Oxy IR/ROXICODONE ) immediate release tablet 5 mg, 5 mg, Oral, Q4H PRN, Opyd, Timothy S, MD   prochlorperazine  (COMPAZINE ) injection 5 mg, 5 mg, Intravenous, Q6H PRN, Opyd, Timothy S, MD   QUEtiapine  (SEROQUEL ) tablet 100 mg, 100 mg, Oral, Q24H, Danford, Christopher P, MD, 100 mg at 03/02/24 1245   QUEtiapine  (SEROQUEL ) tablet 150 mg, 150 mg, Oral, BID AC & HS, Danford, Lonni SQUIBB, MD, 150 mg at 03/03/24 0736   senna (SENOKOT) tablet 8.6 mg, 1 tablet, Oral, Daily PRN, Opyd, Evalene RAMAN, MD   sodium chloride  0.9 % bolus 500 mL, 500 mL, Intravenous, Once, Opyd, Evalene RAMAN, MD, Stopped at 03/02/24 0313   sodium chloride  flush (NS) 0.9 % injection 3 mL, 3 mL, Intravenous, Q12H, Opyd, Evalene RAMAN, MD, 3 mL at 03/02/24 2155   vancomycin  (VANCOCIN ) IVPB 1000  mg/200 mL premix, 1,000 mg, Intravenous, Q24H, Oriet, Jonathan, RPH, Stopped at 03/03/24 9493  Current Outpatient Medications:    aspirin  EC 81 MG tablet, Take 81 mg by mouth daily. Swallow whole., Disp: , Rfl:    clonazePAM  (KLONOPIN ) 0.5 MG tablet, Take 0.5 mg by mouth in the morning, at noon, and at bedtime., Disp: , Rfl:    cloNIDine  (CATAPRES  - DOSED IN MG/24 HR) 0.2 mg/24hr patch, Place 0.2 mg onto the skin once a week. Thursday, Disp: , Rfl:    cyanocobalamin  (VITAMIN B12) 1000 MCG tablet, Take 1,000 mcg by mouth daily., Disp: , Rfl:    dextromethorphan-guaiFENesin  (MUCINEX  DM) 30-600 MG 12hr tablet, Take 1 tablet by mouth every 6 (six) hours as needed for cough., Disp: , Rfl:    ferrous sulfate  325 (65 FE) MG tablet, Take 325 mg by mouth daily., Disp: , Rfl:    guaiFENesin  (MUCINEX ) 600 MG 12 hr tablet, Take 600 mg by mouth 2 (two) times daily as needed for to loosen phlegm., Disp: , Rfl:    lamoTRIgine  (LAMICTAL ) 100 MG tablet, Take 100 mg by mouth 2 (two) times  daily., Disp: , Rfl:    loratadine  (CLARITIN ) 10 MG tablet, Take 10 mg by mouth daily., Disp: , Rfl:    magnesium  oxide (MAG-OX) 400 MG tablet, Take 400 mg by mouth at bedtime., Disp: , Rfl:    Melatonin 3 MG TABS, Take 3 mg by mouth every evening., Disp: , Rfl:    Multiple Vitamins-Minerals (PRESERVISION AREDS 2) CAPS, Take 1 capsule by mouth 2 (two) times daily., Disp: , Rfl:    Omega-3 Fatty Acids (FISH OIL) 1000 MG CAPS, Take 1,000 mg by mouth daily with supper., Disp: , Rfl:    QUEtiapine  (SEROQUEL ) 100 MG tablet, Take 100-150 mg by mouth See admin instructions. Take 150mg  in the AM and PM. Take 100mg  at noon., Disp: , Rfl:    QUEtiapine  (SEROQUEL ) 25 MG tablet, Take 25 mg by mouth daily as needed (agitation)., Disp: , Rfl:    rosuvastatin  (CRESTOR ) 20 MG tablet, Take 20 mg by mouth daily with supper., Disp: , Rfl:    senna (SENOKOT) 8.6 MG TABS tablet, Take 1 tablet by mouth at bedtime., Disp: , Rfl:

## 2024-03-03 NOTE — Progress Notes (Signed)
 Received on stretcher from ED.  Oriented to room.

## 2024-03-03 NOTE — Progress Notes (Signed)
" °  Progress Note   [name] FMW:[id number] DOB: July 19, 1960 DOA: 03/02/2024     0 DOS: the patient was seen and examined on 03/03/2024 at 9:40AM      Brief hospital course: 64 y.o. M with developmental delay, lives in group home, renal cancer, pending treatment, MGUS follows at [facility], hx seizures, dCHF, SSS s/p PPM, CKD IIIa baseline 1.5, HTN, traumatic SDH last Nov 2025, and TIA who presented to [facility] with unwitnessed fall.  In the ER, mentation at baseline but CT showed large SDH and small SAH.    Neurosurgery recommended no intervention but due to location of bleed, high risk for seizure, so transferred to [facility] for Neurology evaluation.     Assessment and Plan: Right parafalcine SDH Right temporal SAH See summary from yesterday. Mentation stable, but too high risk to discharge with unclear platelet trajectory - Transfuse platelets if <50K - Neuro checks q4h     Hypothermia Hypoglycemia Possible sepsis Unclear combination.  Still requiring dextrose  fluids.  We will attempt to hold them this morning, but I suspect will need to continue  Cortisol 11, sort of indeterminate.  TSH normal.  CXR clear.  I have low suspicion for infection but will continue empiric antibiotics.  No liver disease.    - Hold Dextrose  fluids - Frequent CBGs - Follow blood and urine cultures, no growth to date - Continue empiric antibiotics for now     Thrombocytopenia Anemia MGUS Platelets stable.  See sumamry from yesterday.  RI inappropriately low.  Smear unremarkable.  LDH normal.  Folate, B12, and ferritin replete. - Lamictal  stopped - Continue new VImpat    - Appreciate Hematology input - Trend CBC - Transfuse platelets for <50K (given bleed) - Follow SPEP, FLCs     AKI on CKD IIIa Hyperkalemia Cr stable - Avoid hypotension, nephrotoxins     Recent pneumonia Admitted 3 weeks PTA for pneumonia to [facility].  CXR now read as left base atelectasis, no clear infiltrate.    Seizure disorder Unclear if he had an unwitnessed seizure prior to admission. No seizures here - Stop Lamictal  - EEG if change in mentation or seizure observed - Ctoninue VImpat  -Continue clonazepam    SSS s/p PPM Not on rate control medication at baseline - Hold clonidine    HTN BP trending up - Hold clonidine  until hemodynamics clearer   Developmental delay OCD -Continue home clonazepam , Seroquel   Head laceration Sutures will need to be removed ~2/4        Subjective: No clinical change, no bleeidng, no nursing concerns, no seizures. No fever.     Physical Exam: BP (!) 156/79   Pulse 64   Temp (!) 97.3 F (36.3 C)   Resp 14   Ht 5' 8 (1.727 m)   Wt 81.6 kg   SpO2 99%   BMI 27.35 kg/m   Adult male, lying in bed, head bandaged RRR no murmurs RR normal, lungs clear Abdomen soft Mentation okay  Data Reviewed: CBC shows stbale anemia and thrombocytopenia BMP shows no change in Cr       Disposition: Status is: Inpatient         Author: Lonni SHAUNNA Dalton, MD 03/03/2024 11:40 AM  For on call review [email or web address].    "

## 2024-03-03 NOTE — Consult Note (Signed)
 "  [facility]  [phone]   HEMATOLOGY ONCOLOGY INPATIENT CONSULTATION   Frank Conley  DOB: [birth year]  MR#: [id number]  CSN#: [id number]    Requesting Physician: [facility]  Patient Care Team: Frank Lango, MD as PCP - General (Family Medicine)  Reason for consult: Anemia and thrombocytopenia  History of present illness:   This is a 64 year old male with past medical history of seizure disorder, cognitive dysfunction, lives in a group home, renal cancer pending treatment, MGUS follow-up at [facility], congestive heart failure, CKD stage IIIa, hypertension, was admitted for SDH and small SAH after fall at home.  Labs showed slightly worsening anemia and thrombocytopenia, I was called for consult.  Patient was seen by GU oncology at the [facility] on January 17, 2024, for a large 9 cm complex cyst in kidney which was found on CT scan when he was hospitalized for pneumonia.  Radiation was recommended but that he has not received any treatment.  He was also previously seen by [facility] hematology for MGUS, anemia and mild thrombocytopenia, which was felt to be related to seizure medication  Patient is a poor historian, not able to tell me much.  He has food and drinks spilled on his chest/cloth when I saw him. No family at bedside.   MEDICAL HISTORY:  Past Medical History:  Diagnosis Date   Depression    Hypertension    MGUS (monoclonal gammopathy of unknown significance)    Pervasive developmental disorder    Seizures (HCC)    SSS (sick sinus syndrome) (HCC)    PPM placed    SURGICAL HISTORY: Past Surgical History:  Procedure Laterality Date   PACEMAKER IMPLANT      SOCIAL HISTORY: Social History   Socioeconomic History   Marital status: Single    Spouse name: Not on file   Number of children: Not on file   Years of education: Not on file   Highest education level: Not on file  Occupational History   Not on file  Tobacco Use   Smoking status: Never    Smokeless tobacco: Never  Vaping Use   Vaping status: Never Used  Substance and Sexual Activity   Alcohol use: Never   Drug use: Never   Sexual activity: Not Currently  Other Topics Concern   Not on file  Social History Narrative   Spoke with sister Frank Conley who gave number for Care manager Frank Conley [phone], [phone], to discuss care and discharge planning as needed.   Social Drivers of Health   Tobacco Use: Low Risk (03/02/2024)   Patient History    Smoking Tobacco Use: Never    Smokeless Tobacco Use: Never    Passive Exposure: Not on file  Financial Resource Strain: Low Risk (01/17/2024)   Received from [facility]   Overall Financial Resource Strain (CARDIA)    How hard is it for you to pay for the very basics like food, housing, medical care, and heating?: Not hard at all  Food Insecurity: Patient Unable To Answer (02/08/2024)   Epic    Worried About Programme Researcher, Broadcasting/film/video in the Last Year: Patient unable to answer    Ran Out of Food in the Last Year: Patient unable to answer  Transportation Needs: No Transportation Needs (02/08/2024)   Epic    Lack of Transportation (Medical): No    Lack of Transportation (Non-Medical): No  Physical Activity: Not on file  Stress: Not on file  Social Connections: Not on  file  Intimate Partner Violence: Patient Unable To Answer (02/08/2024)   Epic    Fear of Current or Ex-Partner: Patient unable to answer    Emotionally Abused: Patient unable to answer    Physically Abused: Patient unable to answer    Sexually Abused: Patient unable to answer  Depression (EYV7-0): Not on file  Alcohol Screen: Not on file  Housing: Low Risk (02/08/2024)   Epic    Unable to Pay for Housing in the Last Year: No    Number of Times Moved in the Last Year: 0    Homeless in the Last Year: No  Utilities: Patient Unable To Answer (02/08/2024)   Epic    Threatened with loss of utilities: Patient unable to answer  Health Literacy: Not on file     FAMILY HISTORY: Family History  Family history unknown: Yes    ALLERGIES:  is allergic to dilantin [phenytoin], keppra  [levetiracetam ], and lisinopril.  MEDICATIONS:  Current Facility-Administered Medications  Medication Dose Route Frequency Provider Last Rate Last Admin   acetaminophen  (TYLENOL ) tablet 650 mg  650 mg Oral Q6H PRN Conley, Frank S, MD       Or   acetaminophen  (TYLENOL ) suppository 650 mg  650 mg Rectal Q6H PRN Conley, Frank S, MD       ceFEPIme  (MAXIPIME ) 2 g in sodium chloride  0.9 % 100 mL IVPB  2 g Intravenous Q12H Frank Conley, RPH   Stopped at 03/03/24 0345   clonazePAM  (KLONOPIN ) tablet 0.5 mg  0.5 mg Oral TID Conley, Frank S, MD   0.5 mg at 03/03/24 9090   cyanocobalamin  (VITAMIN B12) tablet 1,000 mcg  1,000 mcg Oral Daily Frank Frank SQUIBB, MD   1,000 mcg at 03/03/24 9090   fentaNYL  (SUBLIMAZE ) injection 12.5-50 mcg  12.5-50 mcg Intravenous Q2H PRN Conley, Frank S, MD       ferrous sulfate  tablet 325 mg  325 mg Oral Daily Frank Frank SQUIBB, MD   325 mg at 03/03/24 9090   hydrALAZINE  (APRESOLINE ) tablet 10 mg  10 mg Oral Q6H PRN Conley, Frank S, MD       lacosamide  (VIMPAT ) tablet 100 mg  100 mg Oral BID Conley, Ashish, MD   100 mg at 03/03/24 9090   magnesium  oxide (MAG-OX) tablet 400 mg  400 mg Oral QHS Frank Frank SQUIBB, MD   400 mg at 03/02/24 2151   oxyCODONE  (Oxy IR/ROXICODONE ) immediate release tablet 5 mg  5 mg Oral Q4H PRN Conley, Frank S, MD       prochlorperazine  (COMPAZINE ) injection 5 mg  5 mg Intravenous Q6H PRN Conley, Frank S, MD       QUEtiapine  (SEROQUEL ) tablet 100 mg  100 mg Oral Q24H Frank Conley, Frank SQUIBB, MD   100 mg at 03/03/24 1235   QUEtiapine  (SEROQUEL ) tablet 150 mg  150 mg Oral BID AC & HS Frank Conley, Frank SQUIBB, MD   150 mg at 03/03/24 0736   senna (SENOKOT) tablet 8.6 mg  1 tablet Oral Daily PRN Conley, Frank S, MD       sodium chloride  0.9 % bolus 500 mL  500 mL Intravenous Once Conley, Frank S, MD   Stopped at  03/02/24 0313   sodium chloride  flush (NS) 0.9 % injection 3 mL  3 mL Intravenous Q12H Conley, Frank S, MD   3 mL at 03/02/24 2155   vancomycin  (VANCOCIN ) IVPB 1000 mg/200 mL premix  1,000 mg Intravenous Q24H Frank Conley, [facility]   Stopped at 03/03/24 [phone]  Current Outpatient Medications  Medication Sig Dispense Refill   aspirin  EC 81 MG tablet Take 81 mg by mouth daily. Swallow whole.     clonazePAM  (KLONOPIN ) 0.5 MG tablet Take 0.5 mg by mouth in the morning, at noon, and at bedtime.     cloNIDine  (CATAPRES  - DOSED IN MG/24 HR) 0.2 mg/24hr patch Place 0.2 mg onto the skin once a week. Thursday     cyanocobalamin  (VITAMIN B12) 1000 MCG tablet Take 1,000 mcg by mouth daily.     dextromethorphan-guaiFENesin  (MUCINEX  DM) 30-600 MG 12hr tablet Take 1 tablet by mouth every 6 (six) hours as needed for cough.     ferrous sulfate  325 (65 FE) MG tablet Take 325 mg by mouth daily.     guaiFENesin  (MUCINEX ) 600 MG 12 hr tablet Take 600 mg by mouth 2 (two) times daily as needed for to loosen phlegm.     lamoTRIgine  (LAMICTAL ) 100 MG tablet Take 100 mg by mouth 2 (two) times daily.     loratadine  (CLARITIN ) 10 MG tablet Take 10 mg by mouth daily.     magnesium  oxide (MAG-OX) 400 MG tablet Take 400 mg by mouth at bedtime.     Melatonin 3 MG TABS Take 3 mg by mouth every evening.     Multiple Vitamins-Minerals (PRESERVISION AREDS 2) CAPS Take 1 capsule by mouth 2 (two) times daily.     Omega-3 Fatty Acids (FISH OIL) 1000 MG CAPS Take 1,000 mg by mouth daily with supper.     QUEtiapine  (SEROQUEL ) 100 MG tablet Take 100-150 mg by mouth See admin instructions. Take 150mg  in the AM and PM. Take 100mg  at noon.     QUEtiapine  (SEROQUEL ) 25 MG tablet Take 25 mg by mouth daily as needed (agitation).     rosuvastatin  (CRESTOR ) 20 MG tablet Take 20 mg by mouth daily with supper.     senna (SENOKOT) 8.6 MG TABS tablet Take 1 tablet by mouth at bedtime.      REVIEW OF SYSTEMS:   Not able to obtain.  PHYSICAL  EXAMINATION:   Vitals:   03/03/24 0630 03/03/24 0955  BP: (!) 153/84 (!) 156/79  Pulse: 72 64  Resp: 13 14  Temp: 97.7 F (36.5 C) (!) 97.3 F (36.3 C)  SpO2: 100% 99%   Filed Weights   03/02/24 0131  Weight: 179 lb 14.3 oz (81.6 kg)    GENERAL:alert, no distress and comfortable SKIN: skin color, texture, turgor are normal, no rashes or significant lesions except ecchymosis in forearm, and skin hyperpigmentation in lower extremity EYES: normal, conjunctiva are pink and non-injected, sclera clear OROPHARYNX:no exudate, no erythema and lips, buccal mucosa, and tongue normal  NECK: supple, thyroid normal size, non-tender, without nodularity LYMPH:  no palpable lymphadenopathy in the cervical, axillary or inguinal LUNGS: clear to auscultation and percussion with normal breathing effort HEART: regular rate & rhythm and no murmurs and no lower extremity edema ABDOMEN:abdomen soft, non-tender and normal bowel sounds Musculoskeletal:no cyanosis of digits and no clubbing    LABORATORY DATA:  I have reviewed the data as listed Lab Results  Component Value Date   WBC 5.5 03/03/2024   HGB 7.6 (L) 03/03/2024   HCT 24.0 (L) 03/03/2024   MCV 100.4 (H) 03/03/2024   PLT 52 (L) 03/03/2024   Recent Labs    02/09/24 0431 03/01/24 1821 03/02/24 0421 03/03/24 0207  NA 141 139 143 141  K 4.7 5.8* 4.7 4.5  CL 111 108 116* 112*  CO2 22 21*  22 20*  GLUCOSE 70 112* 75 97  BUN 41* 56* 51* 40*  CREATININE 1.78* 2.28* 2.02* 2.05*  CALCIUM  8.8* 8.4* 7.5* 8.2*  GFRNONAA 43* 32* 37* 36*  PROT 5.9* 6.1*  --  5.6*  ALBUMIN 3.3* 3.4*  --  3.1*  AST 33 52*  --  45*  ALT 20 32  --  30  ALKPHOS 113 128*  --  118  BILITOT 0.3 0.3  --  0.4  BILIDIR  --  <0.1  --   --   IBILI  --  NOT CALCULATED  --   --     RADIOGRAPHIC STUDIES: I have personally reviewed the radiological images as listed and agreed with the findings in the report. CT Head Wo Contrast Result Date: 03/01/2024 EXAM: CT HEAD  WITHOUT CONTRAST 03/01/2024 11:27:36 PM TECHNIQUE: CT of the head was performed without the administration of intravenous contrast. Automated exposure control, iterative reconstruction, and/or weight based adjustment of the mA/kV was utilized to reduce the radiation dose to as low as reasonably achievable. COMPARISON: 03/01/2024 CLINICAL HISTORY: Syncope and presyncope, cerebrovascular cause suspected; re-evaluation for intracranial hemorrhage. FINDINGS: BRAIN AND VENTRICLES: Similar right parafalcine subdural hemorrhage measuring up to 8 mm anteriorly. Small volume subarachnoid and subdural hemorrhage along right tentorial leaflet, unchanged. Calcific intracranial atherosclerosis. No evidence of acute infarct. No hydrocephalus. No mass effect or midline shift. ORBITS: Remote left medial orbital wall fracture. SINUSES: No acute abnormality. SOFT TISSUES AND SKULL: Left mastoid effusion. No acute soft tissue abnormality. No skull fracture. IMPRESSION: 1. Similar right parafalcine subdural hemorrhage measuring up to 8 mm anteriorly and small volume subarachnoid and subdural hemorrhage along the right tentorial leaflet, unchanged. 2. Remote left medial orbital wall fracture. Electronically signed by: Dorethia Molt MD 03/01/2024 11:37 PM EST RP Workstation: [id number]   DG Chest Port 1 View Result Date: 03/01/2024 EXAM: 1 VIEW(S) XRAY OF THE CHEST 03/01/2024 07:09:00 PM COMPARISON: 02/08/2024 CLINICAL HISTORY: Cough. FINDINGS: LINES, TUBES AND DEVICES: Right chest cardiac pacing device in place. LUNGS AND PLEURA: Low lung volumes. Mild left basilar atelectasis. No focal pulmonary opacity. No pleural effusion. No pneumothorax. HEART AND MEDIASTINUM: No acute abnormality of the cardiac and mediastinal silhouettes. BONES AND SOFT TISSUES: No acute osseous abnormality. IMPRESSION: 1. Low lung volumes with mild left basilar atelectasis. 2. Right chest cardiac pacing device in place. 3. No acute findings. Electronically  signed by: Dorethia Molt MD 03/01/2024 07:16 PM EST RP Workstation: [id number]   CT Head Wo Contrast Result Date: 03/01/2024 EXAM: CT HEAD WITHOUT CONTRAST 03/01/2024 06:03:19 PM TECHNIQUE: CT of the head was performed without the administration of intravenous contrast. Automated exposure control, iterative reconstruction, and/or weight based adjustment of the mA/kV was utilized to reduce the radiation dose to as low as reasonably achievable. COMPARISON: 01/11/2024. CLINICAL HISTORY: Fall with posterior head injury, initial encounter. FINDINGS: BRAIN AND VENTRICLES: Subdural hematomas are noted along the falx eccentric to the right, measuring 5 mm in greatest dimension. Additionally, these are noted along the tentorium on the right, measuring up to 4 mm in thickness. Some associated subarachnoid hemorrhage is noted along the mesial right temporal lobe posteriorly. No other definitive subarachnoid hemorrhage is noted. No evidence of acute infarct. No mass effect or midline shift. ORBITS: No acute abnormality. SINUSES: No acute abnormality. SOFT TISSUES AND SKULL: A mild scalp hematoma is noted in the midline posteriorly, related to the recent injury. No skull fracture. Traumatic Brain Injury Risk Stratification ----- Skull Fracture: No (Low - mBIG 1) Subdural  Hematoma (SDH): 5 mm (mBIG 2) Subarachnoid Hemorrhage [facility]): Localized, unilateral (Moderate - mBIG 2) Epidural Hematoma (EDH): No (Low - mBIG 1) Cerebral contusion, intra-axial, intraparenchymal Hemorrhage (IPH): No Intraventricular Hemorrhage (IVH): No (Low - mBIG 1) Midline Shift > 1mm or Edema/effacement of sulci/vents: No (Low - mBIG 1) IMPRESSION: 1. Subdural hematomas along the falx eccentric to the right (5 mm) and along the right tentorium (up to 4 mm), with associated subarachnoid hemorrhage along the mesial right temporal lobe posteriorly, without significant mass effect or midline shift. 2. Mild midline posterior scalp hematoma. 3. TBI Risk  Stratification is Moderate - mBIG 2. The findings of this exam were called to Dr. Nicholaus at the time of exam interpretation. Electronically signed by: oneil devonshire MD 03/01/2024 06:12 PM EST RP Workstation: [id number]   CT CHEST WO CONTRAST Result Date: 02/08/2024 EXAM: CT CHEST WITHOUT CONTRAST 02/08/2024 11:32:49 AM TECHNIQUE: CT of the chest was performed without the administration of intravenous contrast. Multiplanar reformatted images are provided for review. Automated exposure control, iterative reconstruction, and/or weight based adjustment of the mA/kV was utilized to reduce the radiation dose to as low as reasonably achievable. COMPARISON: None available. CLINICAL HISTORY: Pneumonia, complication suspected, xray done. FINDINGS: MEDIASTINUM: Cardiomegaly. Trace layering pericardial effusion. 3-vessel coronary artery calcifications. Cardiac changes suggestive of anemia. The central airways are clear. Dilated esophagus. The esophagus appears stable from prior. LYMPH NODES: No gross hilar adenopathy with limited evaluation on this noncontrast study. No mediastinal or axillary lymphadenopathy. LUNGS AND PLEURA: Diffuse bronchial wall thickening with mucous plugging of the left lower lobe. Associated slight mosaic attenuation of the left lower lobe and patchy airspace opacities at the left base (3:106). Stable bilateral calcified micronodules. No pulmonary edema. No pleural effusion or pneumothorax. SOFT TISSUES/BONES: Right chest wall port-a-cath. No acute abnormality of the bones or soft tissues. UPPER ABDOMEN: Partially visualized low-density lesion at the left kidney. Limited images of the upper abdomen demonstrates no acute abnormality. IMPRESSION: 1. Left lower lobe airspace disease consistent with an infectious or inflammatory process. 2. Dilated esophagus, which may predispose to aspiration. Electronically signed by: Morgane Naveau MD 02/08/2024 12:27 PM EST RP Workstation: [id number]   DG Chest  Portable 1 View Result Date: 02/08/2024 CLINICAL DATA:  Chest pain EXAM: PORTABLE CHEST 1 VIEW COMPARISON:  December 21, 2023 FINDINGS: Stable cardiomediastinal silhouette. Right-sided pacemaker is unchanged. Right lung is clear. Minimal left basilar subsegmental atelectasis or infiltrate is noted. Bony thorax is unremarkable. IMPRESSION: Minimal left basilar subsegmental atelectasis or infiltrate. Electronically Signed   By: Lynwood Landy Raddle M.D.   On: 02/08/2024 10:55    ASSESSMENT & PLAN:    Right parafalcine SDH Right temporal SAH Hypothermia and hypoglycemia Acute on chronic anemia Acute on chronic thrombocytopenia History of MGUS Acute on chronic kidney disease Seizure disorder SSS as status post PPM Hypertension Developmental delay Head laceration  Recommendations: - Chronic anemia probably secondary to CKD, worse due to his recent bleeding. - Recent ferritin, B12 and folate were normal, no evidence of nutritional anemia - No evidence of hemolysis based on lab, blood smear was negative for schistocytes.  No concern for TTP or HUS. - Not sure the etiology of his chronic mild thrombocytopenia, possible related to Lamictal .  Worse probably related to his acute bleeding - Immature platelet was low, low suspicion for ITP. - Agree with monitoring CBC closely, and consider blood transfusion if hemoglobin less than 7.0, and platelet transfusion if platelets less than 50K due to his recent intracranial bleeding. -Multiple  myeloma panel and serum light chain were checked after admission, results are still pending.  I do not have high suspicion for multiple myeloma. Will f/u labs  - I will follow-up as needed.   All questions were answered. The patient knows to call the clinic with any problems, questions or concerns.      Onita Mattock, MD 03/03/2024     "

## 2024-03-03 NOTE — ED Notes (Signed)
 Spoke to patients brother for an update. States he lives in [place] and is currently snowed in and so is the group home staff that he lives at. Going to attempt to get here tomorrow for support.

## 2024-03-03 NOTE — Plan of Care (Signed)
 Attempted to get him up for ambulation with assist of 2 other staff but he was pulling away and said he could not do it.  He was stiffening up and would not assist to stand.  Brother did confirm that he has been having difficulty walking and does use a walker and assist at the group home.  I did confirm that he has been incont of bowel and bladder at the group home at times, as brother said he has been increasingly incont but mostly at night.    Problem: Education: Goal: Knowledge of General Education information will improve Description: Including pain rating scale, medication(s)/side effects and non-pharmacologic comfort measures Outcome: Progressing   Problem: Clinical Measurements: Goal: Will remain free from infection Outcome: Progressing   Problem: Clinical Measurements: Goal: Diagnostic test results will improve Outcome: Progressing   Problem: Clinical Measurements: Goal: Respiratory complications will improve Outcome: Progressing   Problem: Activity: Goal: Risk for activity intolerance will decrease Outcome: Progressing   Problem: Nutrition: Goal: Adequate nutrition will be maintained Outcome: Progressing   Problem: Coping: Goal: Level of anxiety will decrease Outcome: Progressing   Problem: Safety: Goal: Ability to remain free from injury will improve Outcome: Progressing   Problem: Skin Integrity: Goal: Risk for impaired skin integrity will decrease Outcome: Progressing

## 2024-03-03 NOTE — ED Notes (Signed)
 Frank Conley, Guardian and brother [phone] is requesting an update and to discuss his plan of care.

## 2024-03-04 DIAGNOSIS — S065XAA Traumatic subdural hemorrhage with loss of consciousness status unknown, initial encounter: Secondary | ICD-10-CM | POA: Diagnosis not present

## 2024-03-04 LAB — CBC
HCT: 22.7 % — ABNORMAL LOW (ref 39.0–52.0)
Hemoglobin: 7.4 g/dL — ABNORMAL LOW (ref 13.0–17.0)
MCH: 31.8 pg (ref 26.0–34.0)
MCHC: 32.6 g/dL (ref 30.0–36.0)
MCV: 97.4 fL (ref 80.0–100.0)
Platelets: 59 10*3/uL — ABNORMAL LOW (ref 150–400)
RBC: 2.33 MIL/uL — ABNORMAL LOW (ref 4.22–5.81)
RDW: 17.7 % — ABNORMAL HIGH (ref 11.5–15.5)
WBC: 3.4 10*3/uL — ABNORMAL LOW (ref 4.0–10.5)
nRBC: 0 % (ref 0.0–0.2)

## 2024-03-04 LAB — GLUCOSE, CAPILLARY
Glucose-Capillary: 105 mg/dL — ABNORMAL HIGH (ref 70–99)
Glucose-Capillary: 120 mg/dL — ABNORMAL HIGH (ref 70–99)
Glucose-Capillary: 127 mg/dL — ABNORMAL HIGH (ref 70–99)
Glucose-Capillary: 149 mg/dL — ABNORMAL HIGH (ref 70–99)
Glucose-Capillary: 73 mg/dL (ref 70–99)
Glucose-Capillary: 90 mg/dL (ref 70–99)
Glucose-Capillary: 93 mg/dL (ref 70–99)
Glucose-Capillary: 99 mg/dL (ref 70–99)

## 2024-03-04 LAB — BASIC METABOLIC PANEL WITH GFR
Anion gap: 7 (ref 5–15)
BUN: 31 mg/dL — ABNORMAL HIGH (ref 8–23)
CO2: 22 mmol/L (ref 22–32)
Calcium: 8.6 mg/dL — ABNORMAL LOW (ref 8.9–10.3)
Chloride: 112 mmol/L — ABNORMAL HIGH (ref 98–111)
Creatinine, Ser: 1.7 mg/dL — ABNORMAL HIGH (ref 0.61–1.24)
GFR, Estimated: 45 mL/min — ABNORMAL LOW
Glucose, Bld: 78 mg/dL (ref 70–99)
Potassium: 4.1 mmol/L (ref 3.5–5.1)
Sodium: 141 mmol/L (ref 135–145)

## 2024-03-04 LAB — KAPPA/LAMBDA LIGHT CHAINS
Kappa free light chain: 56.8 mg/L — ABNORMAL HIGH (ref 3.3–19.4)
Kappa, lambda light chain ratio: 1.99 — ABNORMAL HIGH (ref 0.26–1.65)
Lambda free light chains: 28.5 mg/L — ABNORMAL HIGH (ref 5.7–26.3)

## 2024-03-04 LAB — MRSA NEXT GEN BY PCR, NASAL: MRSA by PCR Next Gen: NOT DETECTED

## 2024-03-04 MED ORDER — SODIUM CHLORIDE 0.9 % IV SOLN
2.0000 g | INTRAVENOUS | Status: AC
  Administered 2024-03-04 – 2024-03-05 (×2): 2 g via INTRAVENOUS
  Filled 2024-03-04 (×2): qty 20

## 2024-03-04 NOTE — TOC Initial Note (Signed)
 Transition of Care [facility]) - Initial/Assessment Note    Patient Details  Name: Frank Conley MRN: [id number] Date of Birth: March 18, 1960  Transition of Care [facility]) CM/SW Contact:    Almarie CHRISTELLA Goodie, LCSW Phone Number: 03/04/2024, 2:43 PM  Clinical Narrative:     Patient from intermediary care home with 24/7 assistance, RW and access to wheelchair if need be. CSW spoke with patient's brother/guardian, Marinda, to discuss patient's care needs. Per brother, at baseline patient can usually ambulate with RW, needs assistance with ADLs, has access to staff 75/2 at his group home. He is incontinent overnight or when lying for long times. CSW discussed recommendation for some type of rehab, and discussed options available (SNF vs AIR), and brother initially expressed preference for AIR as SNF placement in the past has not gone well due to patient's cognitive deficits. After further discussion, brother reported not being interested in any rehab as he felt that the patient would do better upon return to his group home with familiar staff. CSW asked about discussing with group home, and brother said he would be meeting with the group home staff at the bedside.   CSW received message back that brother and group home staff were at bedside and interested in rehab. CSW spoke with brother again, he is in agreement with AIR, preference for closer to [facility]. [facility] is located in [place], brother in agreement. CSW contacted [facility] Admissions, left a voicemail asking for call back to send referral. CSW to follow.              Expected Discharge Plan: IP Rehab Facility Barriers to Discharge: Continued Medical Work up   Patient Goals and CMS Choice Patient states their goals for this hospitalization and ongoing recovery are:: has legal guardian CMS Medicare.gov Compare Post Acute Care list provided to:: Patient Represenative (must comment) Choice offered to / list presented to : [facility] POA / Guardian [facility] ownership interest in [facility]provided to:: [facility] POA / Guardian    Expected Discharge Plan and Services     Post Acute Care Choice: IP Rehab Living arrangements for the past 2 months: Group Home                                      Prior Living Arrangements/Services Living arrangements for the past 2 months: Group Home Lives with:: Facility Resident Patient language and need for interpreter reviewed:: No Do you feel safe going back to the place where you live?: Yes      Need for Family Participation in Patient Care: Yes (Comment) Care giver support system in place?: Yes (comment) Current home services: DME, Sitter Criminal Activity/Legal Involvement Pertinent to Current Situation/Hospitalization: No - Comment as needed  Activities of Daily Living      Permission Sought/Granted Permission sought to share information with : Facility Medical Sales Representative, Family Supports Permission granted to share information with : Yes, Verbal Permission Granted  Share Information with NAME: Marinda Race  Permission granted to share info w AGENCY: [facility]  Permission granted to share info w Relationship: Guardian/Brother, Group home staff     Emotional Assessment   Attitude/Demeanor/Rapport: Unable to Assess Affect (typically observed): Unable to Assess Orientation: : Oriented to Self, Oriented to Place, Oriented to  Time, Oriented to Situation Alcohol / Substance Use: Not Applicable Psych Involvement: No (comment)  Admission diagnosis:  Subdural hematoma (  HCC) [S06.5XAA] TBI (traumatic brain injury) (HCC) [S06.9XAA] AKI (acute kidney injury) [N17.9] Injury of head, initial encounter [S09.90XA] Patient Active Problem List   Diagnosis Date Noted   TBI (traumatic brain injury) (HCC) 03/02/2024   AKI (acute kidney injury) 03/02/2024   Anemia of chronic disease 02/09/2024   Subdural hematoma (HCC) 01/07/2024   Internal carotid artery stenosis  11/22/2023   Chronic diastolic CHF (congestive heart failure) (HCC) 11/22/2023   Chronic kidney disease, stage 3a (HCC) 11/22/2023   Overweight (BMI 25.0-29.9) 11/22/2023   Fall at home, initial encounter 11/22/2023   Renal mass 11/22/2023   TIA (transient ischemic attack) 11/22/2023   Positive D dimer 11/22/2023   OCD (obsessive compulsive disorder) 11/22/2023   Depression 11/22/2023   Vertebral artery stenosis 12/18/2022   Left-sided weakness 12/17/2022   MGUS (monoclonal gammopathy of unknown significance) 07/07/2021   Multifocal pneumonia 12/20/2018   Seizure disorder (HCC) 12/20/2018   Acute respiratory failure with hypoxia (HCC) 12/20/2018   Essential hypertension 12/20/2018   Acute kidney injury superimposed on CKD 12/20/2018   Hyponatremia 12/20/2018   Pancytopenia (HCC) 12/20/2018   Developmental delay disorder 12/20/2018   CKD (chronic kidney disease) stage 3, GFR 30-59 ml/min (HCC) 12/20/2018   Age-related nuclear cataract of both eyes 07/18/2017   Early dry stage nonexudative age-related macular degeneration of both eyes 07/18/2017   Myopia with astigmatism and presbyopia, bilateral 07/18/2017   Onychomycosis 06/29/2016   Risk for falls 02/10/2015   Pacemaker 11/06/2014   Septic shock (HCC) 11/06/2014   Dysarthria 10/31/2014   Thrombocytopenia 10/08/2014   Medication monitoring encounter 04/24/2014   Tremor 04/24/2014   Unsteadiness on feet 04/24/2014   History of nonmelanoma skin cancer 07/22/2013   Colon polyps 07/09/2013   Enuresis, primary, functional 05/09/2013   Gait disturbance 04/17/2013   Hemorrhoid 04/17/2013   Intention tremor 04/17/2013   Actinic keratosis 05/27/2010   Major depressive disorder, single episode, severe, with psychotic behavior (HCC) 03/21/2010   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   Pervasive developmental disorder 03/21/2010   Obsessive-compulsive disorder 03/07/2010   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   Obesity due to excess  calories 02/25/2010   PCP:  Lizette Lango, MD Pharmacy:   [facility] - [place], [place] - [address] [place] [address] Phone: [phone] Fax: [phone]     Social Drivers of Health (SDOH) Social History: SDOH Screenings   Food Insecurity: Patient Unable To Answer (03/03/2024)  Housing: Low Risk (03/03/2024)  Transportation Needs: Patient Unable To Answer (03/03/2024)  Utilities: Patient Unable To Answer (03/03/2024)  Financial Resource Strain: Low Risk (01/17/2024)   Received from [facility]  Tobacco Use: Low Risk (03/02/2024)   SDOH Interventions:     Readmission Risk Interventions     No data to display

## 2024-03-04 NOTE — Evaluation (Signed)
 Physical Therapy Evaluation Patient Details Name: Frank Conley MRN: [id number] DOB: 04-09-1960 Today's Date: 03/04/2024  History of Present Illness  Frank Conley is a 64 y.o. male presented to the ED with scalp laceration due to unwitnessed fall at group home. Found to be hypothermic, CT was concerning for SDH along the falx and right tentorium and SAH along the right temporal lobe posteriorly without significant mass effect or midline shift. PHMx: developmental delay, depression, seizure disorder, SSS s/p PPM, hypertension, CKD 3A, and MGUS, OCD, gait distubance, tremor, left side weakness, macular degeneration, renal cancer pending treatment.  Clinical Impression  Pt presents with admitting diagnosis above. Co-treat with OT. Pt today was able to ambulate short distance with RW +2 Mod A. PTA pt reports that he was ambulatory short distances with RW at his group home with staff assistance. Per chart review pt has had multiple falls. Patient will benefit from intensive inpatient follow-up therapy, >3 hours/day. PT will continue to follow.         If plan is discharge home, recommend the following: Two people to help with walking and/or transfers;A lot of help with bathing/dressing/bathroom;Assistance with cooking/housework;Direct supervision/assist for medications management;Direct supervision/assist for financial management;Assist for transportation;Supervision due to cognitive status;Help with stairs or ramp for entrance   Can travel by private vehicle        Equipment Recommendations None recommended by PT  Recommendations for Other Services  Rehab consult    Functional Status Assessment Patient has had a recent decline in their functional status and demonstrates the ability to make significant improvements in function in a reasonable and predictable amount of time.     Precautions / Restrictions Precautions Precautions: Fall Recall of Precautions/Restrictions:  Impaired Restrictions Weight Bearing Restrictions Per Provider Order: No      Mobility  Bed Mobility Overal bed mobility: Needs Assistance Bed Mobility: Supine to Sit     Supine to sit: Min assist     General bed mobility comments: Trunk elevation via HHA.    Transfers Overall transfer level: Needs assistance Equipment used: Rolling walker (2 wheels) Transfers: Sit to/from Stand Sit to Stand: Min assist           General transfer comment: Good hand placement. Min A to power up.    Ambulation/Gait Ambulation/Gait assistance: Mod assist, +2 safety/equipment Gait Distance (Feet): 25 Feet Assistive device: Rolling walker (2 wheels) Gait Pattern/deviations: Trunk flexed, Drifts right/left, Decreased stride length, Step-through pattern Gait velocity: decreased     General Gait Details: Pt requires constant cues for proximity to RW as he ambulates with it way too far out in front of him. PT not responding to verbal, tactile, or visual cues for RW. +2 for line management. 1 LOB requiring Mod A to correct.  Stairs            Wheelchair Mobility     Tilt Bed    Modified Rankin (Stroke Patients Only)       Balance Overall balance assessment: Needs assistance Sitting-balance support: Bilateral upper extremity supported, Feet supported Sitting balance-Leahy Scale: Fair     Standing balance support: Bilateral upper extremity supported, During functional activity, Reliant on assistive device for balance Standing balance-Leahy Scale: Poor Standing balance comment: Reliant on RW                             Pertinent Vitals/Pain Pain Assessment Pain Assessment: Faces Faces Pain Scale: No hurt  Home Living Family/patient expects to be discharged to:: Group home Living Arrangements: Group Home Available Help at Discharge: Other (Comment) (Staff at group home) Type of Home: Group Home Home Access: Level entry       Home Layout: One  level Home Equipment: Agricultural Consultant (2 wheels);Shower seat;Grab bars - tub/shower Additional Comments: Pulled from previous admission    Prior Function Prior Level of Function : Needs assist;History of Falls (last six months)             Mobility Comments: Pt reports ambulatory with RW short distandes ADLs Comments: Pt requires assistance from group home staff for most ADLs     Extremity/Trunk Assessment   Upper Extremity Assessment Upper Extremity Assessment: Defer to OT evaluation    Lower Extremity Assessment Lower Extremity Assessment: Overall WFL for tasks assessed    Cervical / Trunk Assessment Cervical / Trunk Assessment: Normal  Communication   Communication Communication: No apparent difficulties    Cognition Arousal: Alert Behavior During Therapy: WFL for tasks assessed/performed   PT - Cognitive impairments: History of cognitive impairments                       PT - Cognition Comments: Developmental delay at baseline. Following commands: Impaired Following commands impaired: Follows one step commands inconsistently, Follows one step commands with increased time     Cueing Cueing Techniques: Verbal cues, Tactile cues, Gestural cues     General Comments General comments (skin integrity, edema, etc.): VSS    Exercises     Assessment/Plan    PT Assessment Patient needs continued PT services  PT Problem List Decreased strength;Decreased activity tolerance;Decreased range of motion;Decreased balance;Decreased mobility;Decreased coordination;Decreased cognition;Decreased knowledge of use of DME;Decreased safety awareness;Decreased knowledge of precautions;Cardiopulmonary status limiting activity       PT Treatment Interventions DME instruction;Gait training;Stair training;Functional mobility training;Therapeutic activities;Therapeutic exercise;Balance training;Neuromuscular re-education;Cognitive remediation;Patient/family education    PT  Goals (Current goals can be found in the Care Plan section)  Acute Rehab PT Goals Patient Stated Goal: to get better PT Goal Formulation: With patient Time For Goal Achievement: 03/18/24 Potential to Achieve Goals: Fair    Frequency Min 2X/week     Co-evaluation               AM-PAC PT 6 Clicks Mobility  Outcome Measure Help needed turning from your back to your side while in a flat bed without using bedrails?: A Little Help needed moving from lying on your back to sitting on the side of a flat bed without using bedrails?: A Little Help needed moving to and from a bed to a chair (including a wheelchair)?: A Lot Help needed standing up from a chair using your arms (e.g., wheelchair or bedside chair)?: A Little Help needed to walk in hospital room?: A Lot Help needed climbing 3-5 steps with a railing? : Total 6 Click Score: 14    End of Session Equipment Utilized During Treatment: Gait belt Activity Tolerance: Patient tolerated treatment well Patient left: in chair;with call bell/phone within reach;with chair alarm set Nurse Communication: Mobility status PT Visit Diagnosis: Other abnormalities of gait and mobility (R26.89)    Time: 9052-8979 PT Time Calculation (min) (ACUTE ONLY): 33 min   Charges:   PT Evaluation $PT Eval Moderate Complexity: 1 Mod   PT General Charges $$ ACUTE PT VISIT: 1 Visit         Sueellen NOVAK, PT, DPT Acute Rehab Services [phone]   Dortha Neighbors  Kayston Jodoin 03/04/2024, 11:44 AM

## 2024-03-04 NOTE — Evaluation (Signed)
 Occupational Therapy Evaluation Patient Details Name: Frank Conley MRN: [id number] DOB: [birth year] Today's Date: 03/04/2024   History of Present Illness   Frank Conley is a 64 y.o. male presented to the ED with scalp laceration due to unwitnessed fall at group home. Found to be hypothermic, CT was concerning for SDH along the falx and right tentorium and SAH along the right temporal lobe posteriorly without significant mass effect or midline shift. PHMx: developmental delay, depression, seizure disorder, SSS s/p PPM, hypertension, CKD 3A, and MGUS, OCD, gait distubance, tremor, left side weakness, macular degeneration, renal cancer pending treatment.     Clinical Impressions This 64 yo male admitted with above presents to acute OT with PLOF potentially ambulating on his own with and with AD (RW) in group home and staff assisting him with ADLs. Currently he is setup-Mod A for basic ADLs from RW level and is min A for bed mobility, min A sit<>stand, Mod A +2 safety with ambulation and impulsive when up on his feet. He will continue to benefit from acute OT with follow up from intensive inpatient follow-up therapy, >3 hours/day dependent on what level group home can take him back. If this is not an option then SNF.      If plan is discharge home, recommend the following:   A lot of help with walking and/or transfers;A lot of help with bathing/dressing/bathroom;Assistance with cooking/housework;Help with stairs or ramp for entrance;Assist for transportation;Direct supervision/assist for financial management;Direct supervision/assist for medications management     Functional Status Assessment   Patient has had a recent decline in their functional status and demonstrates the ability to make significant improvements in function in a reasonable and predictable amount of time.     Equipment Recommendations   Other (comment) (TBD next venue)     Recommendations for Other Services    Rehab consult     Precautions/Restrictions   Precautions Precautions: Fall Recall of Precautions/Restrictions: Impaired Restrictions Weight Bearing Restrictions Per Provider Order: No     Mobility Bed Mobility Overal bed mobility: Needs Assistance Bed Mobility: Supine to Sit     Supine to sit: Min assist     General bed mobility comments: Trunk elevation via HHA.    Transfers Overall transfer level: Needs assistance Equipment used: Rolling walker (2 wheels) Transfers: Sit to/from Stand Sit to Stand: Min assist           General transfer comment: Good hand placement. Min A to power up.      Balance Overall balance assessment: Needs assistance Sitting-balance support: Feet supported, Single extremity supported Sitting balance-Leahy Scale: Poor     Standing balance support: Bilateral upper extremity supported, During functional activity, Reliant on assistive device for balance Standing balance-Leahy Scale: Poor Standing balance comment: Reliant on RW                           ADL either performed or assessed with clinical judgement   ADL Overall ADL's : Needs assistance/impaired Eating/Feeding: Set up;Bed level   Grooming: Minimal assistance;Sitting   Upper Body Bathing: Moderate assistance;Sitting   Lower Body Bathing: Moderate assistance Lower Body Bathing Details (indicate cue type and reason): min A sit<>stand Upper Body Dressing : Moderate assistance;Sitting   Lower Body Dressing: Total assistance Lower Body Dressing Details (indicate cue type and reason): min A sit<>stand Toilet Transfer: Moderate assistance;+2 for safety/equipment;Ambulation;Rolling walker (2 wheels) Toilet Transfer Details (indicate cue type and reason): simulated bed>out door>back in  room around bed to recliner Toileting- Clothing Manipulation and Hygiene: Total assistance Toileting - Clothing Manipulation Details (indicate cue type and reason): min A sit<>stand                           Pertinent Vitals/Pain Pain Assessment Pain Assessment: Faces Faces Pain Scale: No hurt     Extremity/Trunk Assessment Upper Extremity Assessment Upper Extremity Assessment: Generalized weakness   Lower Extremity Assessment Lower Extremity Assessment: Overall WFL for tasks assessed   Cervical / Trunk Assessment Cervical / Trunk Assessment: Normal   Communication Communication Communication: No apparent difficulties   Cognition Arousal: Alert Behavior During Therapy: WFL for tasks assessed/performed Cognition: History of cognitive impairments             OT - Cognition Comments: unsure if cognition is worse now or not; definitely showed decreased safety with RW despite that he reports he does use one intermittently                 Following commands: Impaired Following commands impaired: Follows one step commands inconsistently, Follows one step commands with increased time     Cueing  General Comments   Cueing Techniques: Verbal cues;Tactile cues;Gestural cues  VSS           Home Living Family/patient expects to be discharged to:: Unsure Living Arrangements: Group Home Available Help at Discharge:  (staff at group home) Type of Home: Group Home Home Access: Level entry     Home Layout: One level     Bathroom Shower/Tub: Producer, Television/film/video: Standard     Home Equipment: Agricultural Consultant (2 wheels);Shower seat;Grab bars - tub/shower   Additional Comments: Pulled from previous admission      Prior Functioning/Environment Prior Level of Function : Needs assist;History of Falls (last six months)             Mobility Comments: Pt reports ambulatory with RW short distances ADLs Comments: Pt requires assistance from group home staff for most ADLs per his report    OT Problem List: Decreased strength;Impaired balance (sitting and/or standing);Decreased cognition;Decreased safety awareness   OT  Treatment/Interventions: Self-care/ADL training;DME and/or AE instruction;Balance training;Patient/family education      OT Goals(Current goals can be found in the care plan section)   Acute Rehab OT Goals Patient Stated Goal: unable to state OT Goal Formulation: Patient unable to participate in goal setting Time For Goal Achievement: 03/18/24 Potential to Achieve Goals: Fair   OT Frequency:  Min 2X/week       AM-PAC OT 6 Clicks Daily Activity     Outcome Measure Help from another person eating meals?: A Little Help from another person taking care of personal grooming?: A Little Help from another person toileting, which includes using toliet, bedpan, or urinal?: A Lot Help from another person bathing (including washing, rinsing, drying)?: A Lot Help from another person to put on and taking off regular upper body clothing?: A Lot Help from another person to put on and taking off regular lower body clothing?: A Lot 6 Click Score: 14   End of Session Equipment Utilized During Treatment: Gait belt;Rolling walker (2 wheels) Nurse Communication: Mobility status (in person)  Activity Tolerance: Patient tolerated treatment well Patient left: in chair;with call bell/phone within reach;with chair alarm set  OT Visit Diagnosis: Unsteadiness on feet (R26.81);Other abnormalities of gait and mobility (R26.89);Muscle weakness (generalized) (M62.81);Other symptoms and signs involving cognitive function  Time: 9052-8979 OT Time Calculation (min): 33 min Charges:  OT General Charges $OT Visit: 1 Visit OT Evaluation $OT Eval Moderate Complexity: 1 Mod  Cathy L. OT Acute Rehabilitation Services Office [phone]    [place] Dorothyann Distel 03/04/2024, 1:27 PM

## 2024-03-04 NOTE — Progress Notes (Signed)
 Inpatient Rehab Admissions Coordinator:   Per therapy recommendations pt was screened for CIR by Reche Lowers, PT, DPT.  Pt appears to be a potential candidate for CIR.  Note preference for [facility].  If [facility] unable to offer a bed we can assess for [facility].    Reche Lowers, PT, DPT Admissions Coordinator [phone] 03/04/24 3:21 PM

## 2024-03-04 NOTE — Progress Notes (Signed)
 " Progress Note   [name] FMW:[id number] DOB: 07/24/1960 DOA: 03/02/2024     1 DOS: the patient was seen and examined on 03/04/2024 at 10:28 AM      Brief hospital course: 64 y.o. M with developmental delay, lives in group home, renal cancer, pending treatment, MGUS follows at [facility], hx seizures, dCHF, SSS s/p PPM, CKD IIIa baseline 1.5, HTN, traumatic SDH last Nov 2025, and TIA who presented to [facility] with unwitnessed fall.  In the ER, mentation at baseline but CT showed large SDH and small SAH.    Neurosurgery recommended no intervention but due to location of bleed, high risk for seizure, so transferred to [facility] for Neurology evaluation.     Assessment and Plan: Right parafalcine SDH Right temporal SAH Had small right supratentorial SDH last fall.  On follow-up in December, repeat CT showed this had resolved.  Gait has been unstable since then, and patient fell at time of admission.  CT here showed larger SDH now, same position, but no mass effect, small SAH along right temporal lobe.  Mentation normal.  Discussed with neurosurgery, repeat CT head stable, no intervention required.  This appears to be stable - Transfuse platelets if less than 50K - Outpatient follow up with [facility] [place]     Suspected pneumonia sepsis Hypothermia and hypoglycemia likely due to sepsis At admission, had hypothermia and hypoglycemia without overt signs of infection.  Started on empiric antibiotics, UA clear, blood cultures no growth, but rales on exam in bases and CXR with basilar opacities.  Has chronic aspiration risk, suspect pneumonia.  Cortisol 11, relatively normal.  TSH normal.  No liver disease.    - Continue antibiotics, narrow to Rocephin , day 4 of 7     Thrombocytopenia Anemia Pancytopenia MGUS Old Heme-Onc notes from [facility] from 2017 and 2025 reviewed.   Had anemia and thrombocytopenia in that time period that was judged to be medication related from Lamictal  and  Seroquel  (SPEP showed moderate spike only).  Those medications were continued though, and he seems not to have had any problems until now (SPEP in 2024 stable, SPEP in 2025 stable, FLCs elevated due to renal failure)   Review of Hgb shows this was trending down to 9 g/dL during 7974.  Acutely down to 7.5 on admission   Platelets were ~140s, until this month (down to 80K Jan 3, down to 50K now) Discussed with Neurology and Hematology.  FLCs elevated due to AKI, ratio near normal.  B12, folate, smear, and ferritin unremarkable - Stop Lamictal  - Change to Vimpat    - Appreciate Hematology input - Trend CBC - Transfuse platelets if <50K (given bleed) - Follow SPEP     AKI on CKD IIIa Hyperkalemia Cr 1.5 during last year.  After admission in early Jan discharge Cr was 1.7, improved to 1.36 at hospital follow up   Cr 2.3 on admission here, improved to 1.7 with fluids - Avoid hypotension, nephrotoxins     Seizure disorder Unclear if he had an unwitnessed seizure prior to admission. No seizures here - Stop Lamictal  - Continue new Vimpat  - Continue clonazepam    Mood disorder Lamictal  was for mood/aggression in addition to seizures.  Discussed with family, they are in agreement with change to Vimpat  for now, will follow up with Psychiatry in [facility] after discharge.  Developmental delay OCD -Continue home clonazepam , Seroquel   SSS s/p PPM Not on rate control medication at baseline - Hold clonidine    HTN BP normal off  medication - Hold clonidine  until hemodynamics clearer   Head laceration Sutures will need to be removed ~2/4        Subjective: Patient sitting up, doing well.  Watching television.  No acute concerns.  Very unsteady in ambulation.     Physical Exam: BP 129/70 (BP Location: Right Arm)   Pulse 63   Temp 97.9 F (36.6 C) (Oral)   Resp 18   Ht 5' 8 (1.727 m)   Wt 81.6 kg   SpO2 100%   BMI 27.35 kg/m   Elderly adult male, sitting up in chair, watching  television RRR, no murmurs, no peripheral edema Respiratory rate normal, crackles in the right base Abdomen soft, no tenderness palpation, no ascites or distention Cognitive impairment appears at baseline, face symmetric, speech dysarthric at baseline, moves all 4 extremities with generalized weakness but symmetric strength   Data Reviewed: CBC shows no change to anemia or thrombocytopenia Patient metabolic panel shows improving creatinine       Disposition: Status is: Inpatient 64 y.o. M with developmental delay admitted with pneumonia, fall and SDH  Also found to have pancytopenia.  From a standpoint of his subdural hematoma, this is stable.  His pneumonia is improving.  Given his severe pancytopenia, I will trend CBC for 2 more days, hopefully to inpatient rehab by the end of the week if counts coming up and no clinical change         Author: Lonni SHAUNNA Dalton, MD 03/04/2024 7:39 PM  For on call review [email or web address].    "

## 2024-03-04 NOTE — Progress Notes (Signed)
 Patient ID: Frank Conley, male   DOB: 1960/05/14, 64 y.o.   MRN: [id number]  PROGRESS NOTE    Frank Conley  FMW:[id number] DOB: February 16, 1960 DOA: 03/02/2024 PCP: Lizette Lango, MD (Confirm with patient/family/NH records and if not entered, this HAS to be entered at [facility] point of entry. No PCP if truly none.)   Chief Complaint  Patient presents with   Head Injury    Brief Narrative:   Frank Conley is a 64 year old male with past medical history of seizure disorder, hypertension, major depressive disorder, chronic kidney disease stage III, pervasive developmental disorder, obsessive-compulsive disorder, intention tremor, chronic diastolic CHF, anemia of chronic disease, mild intellectual disability, and renal mass of ongoing workup. He presented to the ED on 1/24 after an unwitnessed fall at his group home. A laceration on the back of his head was found. Patient does not remember the fall, but appears at normal baseline mental status. Patient was hyperthermic and hypoglycemic, possible sepsis upon admission.    Assessment & Plan: Traumatic Brain Injury  Secondary to fall at group home.  Scalp laceration (superficial fascia) repaired with Monocryl Prolene sutures.  1/24 >> CT head w/o contrast shows a mild scalp hematoma along the midline posteriorly and a remote left medial orbital wall fracture.  Remove sutures 03/12/24.   Subdural Hematoma Secondary to fall at group home.  1/24 >> CT head w/o contrast shows subdural hematomas along the right tentorial leaflet and para falcine, measuring 8mm in greatest dimension. Stable. Monitor for mental status changes.    Subarachnoid Hemorrhage Secondary to fall at group home.  1/24 >> CT head w/o contrast shows a subarachnoid hemorrhage along the mesial right temporal lobe posteriorly.  Stable. Monitor for mental status changes.   Seizure Disorder High seizure risk due to location of hemorrhages.  Currently stable.  Continue lacosamide  and  clonazepam .  EEG monitoring if mental status change.    Acute Kidney Injury on CKD Stage III  Most likely secondary to dehydration.  Creatinine normalizing back to baseline (1.70). GFR 45. Proteinuria.   Stable.  Continue IVF hydration. Monitor.   Hypothermia Hypoglycemia Possible Sepsis Cause unknown (viral panel negative, blood culture negative, UA negative)  Right lower lobe crackles on auscultation.  Temperature stable (97-99 F). CBG Stable (100-130).  Continue IV Dextrose . Deescalate empiric antibiotics.  Monitor.    Anemia of Chronic Disease Most likely secondary to chronic kidney disease.  Hgb 7.4 (baseline around 9). Folate and Ferritin normal. Vitamin B12 very high.  Monitor.   Thrombocytopenia Chronic.  Neurology seems to think secondary to lamotrigine .  Platelets 59  APTT 41  Transfuse if platelets under <50K.  Monitor.     DVT prophylaxis: SCD boots Code Status: Do not attempt resuscitation (DNR) -DNR-LIMITED -Do Not Intubate/DNI  Family Communication: none at bedside  Disposition:   Status is: Inpatient Remains inpatient appropriate because: severity of illness    Consultants:  Neurology  Procedures:   Antimicrobials: cefepime , vancomycin  switched to ceftriaxone     Subjective:  Patient is lying comfortably in bed. No overnight complaints. Appears to be A&O - developmental delay baseline.   Objective: Vitals:   03/03/24 2040 03/04/24 0016 03/04/24 0409 03/04/24 0744  BP: (!) 159/76 (!) 140/70 (!) 155/84 (!) 155/79  Pulse: 62 68 73 64  Resp:  18 18 19   Temp: 98 F (36.7 C) 99.9 F (37.7 C) 99.5 F (37.5 C) 98.3 F (36.8 C)  TempSrc: Oral Oral Oral Oral  SpO2: 98% 98% 99%  98%  Weight:      Height:        Intake/Output Summary (Last 24 hours) at 03/04/2024 0754 Last data filed at 03/03/2024 1728 Gross per 24 hour  Intake 240 ml  Output 1100 ml  Net -860 ml   Filed Weights   03/02/24 0131  Weight: 81.6 kg     Examination:  General exam: Appears calm and comfortable  Respiratory system: Right lower lobe crackles on auscultation. Respiratory effort normal. Cardiovascular system: S1 & S2 heard, RRR. No JVD, murmurs, rubs, gallops or clicks. No pedal edema. Gastrointestinal system: Abdomen is nondistended, soft and nontender. No organomegaly or masses felt. Normal bowel sounds heard. Central nervous system: Alert and oriented. No focal neurological deficits. Extremities: Symmetric 5 x 5 power. Skin: No rashes or lesions. Bilateral lower leg redness with scattered ulcers. Psychiatry: Judgement and insight appear normal for developmental delay baseline. Mood & affect appropriate.     Data Reviewed: I have personally reviewed following labs and imaging studies.  CBC: Recent Labs  Lab 03/01/24 1821 03/02/24 0421 03/02/24 1208 03/02/24 1722 03/03/24 0207 03/04/24 0655  WBC 4.9 4.2 4.1  --  5.5 3.4*  NEUTROABS 3.9  --  3.3  --   --   --   HGB 8.4* 7.2* 7.1* 7.4* 7.6* 7.4*  HCT 26.6* 22.3* 22.1* 24.2* 24.0* 22.7*  MCV 101.1* 100.5* 99.5  --  100.4* 97.4  PLT 55* 51* 51*  --  52* 59*    Basic Metabolic Panel: Recent Labs  Lab 03/01/24 1821 03/02/24 0421 03/03/24 0207 03/04/24 0655  NA 139 143 141 141  K 5.8* 4.7 4.5 4.1  CL 108 116* 112* 112*  CO2 21* 22 20* 22  GLUCOSE 112* 75 97 78  BUN 56* 51* 40* 31*  CREATININE 2.28* 2.02* 2.05* 1.70*  CALCIUM  8.4* 7.5* 8.2* 8.6*    GFR: Estimated Creatinine Clearance: 44.1 mL/min (A) (by C-G formula based on SCr of 1.7 mg/dL (H)).  Liver Function Tests: Recent Labs  Lab 03/01/24 1821 03/03/24 0207  AST 52* 45*  ALT 32 30  ALKPHOS 128* 118  BILITOT 0.3 0.4  PROT 6.1* 5.6*  ALBUMIN 3.4* 3.1*    CBG: Recent Labs  Lab 03/03/24 1107 03/03/24 1233 03/03/24 2104 03/04/24 0016 03/04/24 0410  GLUCAP 79 159* 75 73 127*     Recent Results (from the past 240 hours)  Blood culture (routine x 2)     Status: None (Preliminary  result)   Collection Time: 03/01/24  6:33 PM   Specimen: BLOOD  Result Value Ref Range Status   Specimen Description BLOOD LEFT ANTECUBITAL  Final   Special Requests   Final    BOTTLES DRAWN AEROBIC AND ANAEROBIC Blood Culture results may not be optimal due to an inadequate volume of blood received in culture bottles   Culture   Final    NO GROWTH 3 DAYS Performed at [facility], [address] [place], [place] [address]    Report Status PENDING  Incomplete  Blood culture (routine x 2)     Status: None (Preliminary result)   Collection Time: 03/01/24  6:33 PM   Specimen: BLOOD  Result Value Ref Range Status   Specimen Description BLOOD BLOOD RIGHT FOREARM  Final   Special Requests   Final    BOTTLES DRAWN AEROBIC AND ANAEROBIC Blood Culture results may not be optimal due to an inadequate volume of blood received in culture bottles   Culture   Final  NO GROWTH 3 DAYS Performed at [facility], [address] [place], [place] [address]    Report Status PENDING  Incomplete  Resp panel by RT-PCR (RSV, Flu A&B, Covid) Anterior Nasal Swab     Status: None   Collection Time: 03/02/24  2:28 AM   Specimen: Anterior Nasal Swab  Result Value Ref Range Status   SARS Coronavirus 2 by RT PCR NEGATIVE NEGATIVE Final   Influenza A by PCR NEGATIVE NEGATIVE Final   Influenza B by PCR NEGATIVE NEGATIVE Final    Comment: (NOTE) The Xpert Xpress SARS-CoV-2/FLU/RSV plus assay is intended as an aid in the diagnosis of influenza from Nasopharyngeal swab specimens and should not be used as a sole basis for treatment. Nasal washings and aspirates are unacceptable for Xpert Xpress SARS-CoV-2/FLU/RSV testing.  Fact Sheet for Patients: bloggercourse.com  Fact Sheet for Healthcare Providers: seriousbroker.it  This test is not yet approved or cleared by the [place]  FDA and has been authorized for detection and/or diagnosis  of SARS-CoV-2 by FDA under an Emergency Use Authorization (EUA). This EUA will remain in effect (meaning this test can be used) for the duration of the COVID-19 declaration under Section 564(b)(1) of the Act, 21 U.S.C. section 360bbb-3(b)(1), unless the authorization is terminated or revoked.     Resp Syncytial Virus by PCR NEGATIVE NEGATIVE Final    Comment: (NOTE) Fact Sheet for Patients: bloggercourse.com  Fact Sheet for Healthcare Providers: seriousbroker.it  This test is not yet approved or cleared by the [place]  FDA and has been authorized for detection and/or diagnosis of SARS-CoV-2 by FDA under an Emergency Use Authorization (EUA). This EUA will remain in effect (meaning this test can be used) for the duration of the COVID-19 declaration under Section 564(b)(1) of the Act, 21 U.S.C. section 360bbb-3(b)(1), unless the authorization is terminated or revoked.  Performed at [facility], [address] [place], [place] [address]        Radiology Studies: No results found.    Scheduled Meds:  clonazePAM   0.5 mg Oral TID   cyanocobalamin   1,000 mcg Oral Daily   ferrous sulfate   325 mg Oral Daily   lacosamide   100 mg Oral BID   magnesium  oxide  400 mg Oral QHS   QUEtiapine   100 mg Oral Q24H   QUEtiapine   150 mg Oral BID AC & HS   sodium chloride  flush  3 mL Intravenous Q12H   Continuous Infusions:  ceFEPime  (MAXIPIME ) IV 2 g (03/04/24 0235)   sodium chloride  Stopped (03/02/24 0313)   vancomycin  1,000 mg (03/04/24 0438)     LOS: 1 day    Time spent:     Frank Conley, Student PA [facility]   To contact the attending provider between 7A-7P or the covering provider during after hours 7P-7A, please log into the web site [email or web address] and access using universal [place] password for that web site. If you do not have the password, please call the hospital operator.  03/04/2024, 7:54 AM

## 2024-03-05 ENCOUNTER — Inpatient Hospital Stay (HOSPITAL_COMMUNITY)

## 2024-03-05 DIAGNOSIS — S065XAA Traumatic subdural hemorrhage with loss of consciousness status unknown, initial encounter: Secondary | ICD-10-CM | POA: Diagnosis not present

## 2024-03-05 LAB — MULTIPLE MYELOMA PANEL, SERUM
Albumin SerPl Elph-Mcnc: 2.8 g/dL — ABNORMAL LOW (ref 2.9–4.4)
Albumin/Glob SerPl: 1.3 (ref 0.7–1.7)
Alpha 1: 0.3 g/dL (ref 0.0–0.4)
Alpha2 Glob SerPl Elph-Mcnc: 0.6 g/dL (ref 0.4–1.0)
B-Globulin SerPl Elph-Mcnc: 0.6 g/dL — ABNORMAL LOW (ref 0.7–1.3)
Gamma Glob SerPl Elph-Mcnc: 0.8 g/dL (ref 0.4–1.8)
Globulin, Total: 2.3 g/dL (ref 2.2–3.9)
IgA: 200 mg/dL (ref 61–437)
IgG (Immunoglobin G), Serum: 809 mg/dL (ref 603–1613)
IgM (Immunoglobulin M), Srm: 105 mg/dL (ref 20–172)
M Protein SerPl Elph-Mcnc: 0.1 g/dL — ABNORMAL HIGH
Total Protein ELP: 5.1 g/dL — ABNORMAL LOW (ref 6.0–8.5)

## 2024-03-05 LAB — BASIC METABOLIC PANEL WITH GFR
Anion gap: 9 (ref 5–15)
BUN: 39 mg/dL — ABNORMAL HIGH (ref 8–23)
CO2: 22 mmol/L (ref 22–32)
Calcium: 8.5 mg/dL — ABNORMAL LOW (ref 8.9–10.3)
Chloride: 107 mmol/L (ref 98–111)
Creatinine, Ser: 1.89 mg/dL — ABNORMAL HIGH (ref 0.61–1.24)
GFR, Estimated: 40 mL/min — ABNORMAL LOW
Glucose, Bld: 77 mg/dL (ref 70–99)
Potassium: 4.5 mmol/L (ref 3.5–5.1)
Sodium: 138 mmol/L (ref 135–145)

## 2024-03-05 LAB — GLUCOSE, CAPILLARY
Glucose-Capillary: 88 mg/dL (ref 70–99)
Glucose-Capillary: 73 mg/dL (ref 70–99)
Glucose-Capillary: 79 mg/dL (ref 70–99)
Glucose-Capillary: 84 mg/dL (ref 70–99)
Glucose-Capillary: 94 mg/dL (ref 70–99)
Glucose-Capillary: 108 mg/dL — ABNORMAL HIGH (ref 70–99)

## 2024-03-05 LAB — CBC
HCT: 22.4 % — ABNORMAL LOW (ref 39.0–52.0)
Hemoglobin: 7.4 g/dL — ABNORMAL LOW (ref 13.0–17.0)
MCH: 31.9 pg (ref 26.0–34.0)
MCHC: 33 g/dL (ref 30.0–36.0)
MCV: 96.6 fL (ref 80.0–100.0)
Platelets: 61 10*3/uL — ABNORMAL LOW (ref 150–400)
RBC: 2.32 MIL/uL — ABNORMAL LOW (ref 4.22–5.81)
RDW: 17.4 % — ABNORMAL HIGH (ref 11.5–15.5)
WBC: 3.9 10*3/uL — ABNORMAL LOW (ref 4.0–10.5)
nRBC: 0 % (ref 0.0–0.2)

## 2024-03-05 LAB — PROTEIN ELECTROPHORESIS, SERUM
A/G Ratio: 1.2 (ref 0.7–1.7)
Albumin ELP: 2.7 g/dL — ABNORMAL LOW (ref 2.9–4.4)
Alpha-1-Globulin: 0.3 g/dL (ref 0.0–0.4)
Alpha-2-Globulin: 0.6 g/dL (ref 0.4–1.0)
Beta Globulin: 0.7 g/dL (ref 0.7–1.3)
Gamma Globulin: 0.7 g/dL (ref 0.4–1.8)
Globulin, Total: 2.3 g/dL (ref 2.2–3.9)
Total Protein ELP: 5 g/dL — ABNORMAL LOW (ref 6.0–8.5)

## 2024-03-05 MED ORDER — CLONAZEPAM 0.5 MG PO TABS: 0.5000 mg | ORAL_TABLET | Freq: Three times a day (TID) | ORAL | 0 refills | Status: AC

## 2024-03-05 MED ORDER — LACOSAMIDE 100 MG PO TABS: 100.0000 mg | ORAL_TABLET | Freq: Two times a day (BID) | ORAL | 0 refills | Status: AC

## 2024-03-05 MED ORDER — SODIUM CHLORIDE 0.45 % IV BOLUS
500.0000 mL | Freq: Once | INTRAVENOUS | Status: AC
  Administered 2024-03-05: 500 mL via INTRAVENOUS

## 2024-03-05 NOTE — Progress Notes (Signed)
 " Progress Note   [name] FMW:[id number] DOB: Nov 03, 1960 DOA: 03/02/2024     2   Brief hospital course: 64 y.o. M with developmental delay, lives in group home, renal cancer, pending treatment, MGUS follows at [facility], hx seizures, dCHF, SSS s/p PPM, CKD IIIa baseline 1.5, HTN, traumatic SDH last Nov 2025, and TIA who presented to [facility] with unwitnessed fall.  In the ER, mentation at baseline but CT showed large SDH and small SAH.   Neurosurgery recommended no intervention but due to location of bleed, high risk for seizure, so transferred to [facility] for Neurology evaluation.   Assessment and Plan:  Right parafalcine SDH Right temporal SAH Had small right supratentorial SDH last fall.  On follow-up in December, repeat CT showed this had resolved.  Gait has been unstable since then, and patient fell at time of admission. CT here showed larger SDH now, same position, but no mass effect, small SAH along right temporal lobe.   Mentation normal.   Discussed with neurosurgery, repeat CT head stable, no intervention required.  This appears to be stable Transfuse platelets if less than 50K Outpatient follow up with [facility] [place]   Suspected pneumonia sepsis Hypothermia and hypoglycemia likely due to sepsis At admission, had hypothermia and hypoglycemia without overt signs of infection.  Started on empiric antibiotics, UA clear, blood cultures no growth, but rales on exam in bases and CXR with basilar opacities.  Has chronic aspiration risk, suspect pneumonia. Cortisol 11, relatively normal.  TSH normal.  No liver disease.    Will complete course of antibiotics today. Seen by speech therapy.  To undergo modified barium swallow.   Thrombocytopenia Anemia Pancytopenia MGUS Old Heme-Onc notes from [facility] from 2017 and 2025 reviewed. Had anemia and thrombocytopenia in that time period that was judged to be medication related from Lamictal  and Seroquel  (SPEP showed moderate spike  only).  Those medications were continued though, and he seems not to have had any problems until now (SPEP in 2024 stable, SPEP in 2025 stable, FLCs elevated due to renal failure) Review of Hgb shows this was trending down to 9 g/dL during 7974.  Acutely down to 7.5 on admission Platelets were ~140s, until this month (down to 80K Jan 3, down to 50K now) Discussed with Neurology and Hematology.  B12, folate, smear, and ferritin unremarkable. After discussions with hematology Lamictal  was discontinued.  He was started on Vimpat . Hematology does not have a suspicion for multiple myeloma.  They plan to follow-up as needed. Counts are low but stable.  AKI on CKD IIIa Hyperkalemia Cr 1.5 during last year.  After admission in early Jan discharge Cr was 1.7, improved to 1.36 at hospital follow up Cr 2.3 on admission.  Improved with hydration.  Avoid nephrotoxic agents.   Seizure disorder Unclear if he had an unwitnessed seizure prior to admission. No seizures here Lamictal  has been changed over to Vimpat  due to pancytopenia. Continue clonazepam    Mood disorder Lamictal  was for mood/aggression in addition to seizures.  Discussed with family, they are in agreement with change to Vimpat  for now, will follow up with Psychiatry in [facility] after discharge.  Developmental delay OCD Continue home clonazepam , Seroquel   SSS s/p PPM Not on rate control medication at baseline Holding clonidine .  Watch for rebound hypertension.   HTN BP normal off medication - Holding clonidine  until hemodynamics clearer Blood pressure has been reasonably well-controlled.   Head laceration Sutures will need to be removed ~2/4  DVT prophylaxis:  SCDs CODE STATUS: DNR Family communication: Discussed with brother at bedside Disposition: Inpatient rehab versus return to group home depending on his physical abilities today.  Subjective: Patient lying on the bed.  Answering questions at times.  Brother is at the bedside.   Does not appear to be in any discomfort or distress    Physical Exam: BP (!) 147/77 (BP Location: Right Arm)   Pulse (!) 57   Temp 98.2 F (36.8 C) (Oral)   Resp 18   Ht 5' 8 (1.727 m)   Wt 81.6 kg   SpO2 98%   BMI 27.35 kg/m    General appearance: Awake alert.  In no distress.  Distracted. Resp: Clear to auscultation bilaterally.  Normal effort Cardio: S1-S2 is normal regular.  No S3-S4.  No rubs murmurs or bruit GI: Abdomen is soft.  Nontender nondistended.  Bowel sounds are present normal.  No masses organomegaly Moving his extremities.  Data Reviewed:     Latest Ref Rng & Units 03/05/2024    1:23 AM 03/04/2024    6:55 AM 03/03/2024    2:07 AM  CBC  WBC 4.0 - 10.5 K/uL 3.9  3.4  5.5   Hemoglobin 13.0 - 17.0 g/dL 7.4  7.4  7.6   Hematocrit 39.0 - 52.0 % 22.4  22.7  24.0   Platelets 150 - 400 K/uL 61  59  52       Latest Ref Rng & Units 03/05/2024    1:23 AM 03/04/2024    6:55 AM 03/03/2024    2:07 AM  BMP  Glucose 70 - 99 mg/dL 77  78  97   BUN 8 - 23 mg/dL 39  31  40   Creatinine 0.61 - 1.24 mg/dL 8.10  8.29  7.94   Sodium 135 - 145 mmol/L 138  141  141   Potassium 3.5 - 5.1 mmol/L 4.5  4.1  4.5   Chloride 98 - 111 mmol/L 107  112  112   CO2 22 - 32 mmol/L 22  22  20    Calcium  8.9 - 10.3 mg/dL 8.5  8.6  8.2     Joette Pebbles, MD 03/05/2024 9:51 AM  For on call review [email or web address]    "

## 2024-03-05 NOTE — Progress Notes (Signed)
 Transition of Care [facility]) - CAGE-AID Screening   Patient Details  Name: Frank Conley MRN: [id number] Date of Birth: [birth year]  Transition of Care [facility]) CM/SW Contact:    Bernardino Mayotte, RN Phone Number: 03/05/2024, 6:33 AM   Clinical Narrative:  Denies use of alcohol and illicit substances. Resources not given at this time.  CAGE-AID Screening:    Have You Ever Felt You Ought to Cut Down on Your Drinking or Drug Use?: No Have People Annoyed You By Critizing Your Drinking Or Drug Use?: No Have You Felt Bad Or Guilty About Your Drinking Or Drug Use?: No Have You Ever Had a Drink or Used Drugs First Thing In The Morning to Steady Your Nerves or to Get Rid of a Hangover?: No CAGE-AID Score: 0  Substance Abuse Education Offered: No

## 2024-03-05 NOTE — Plan of Care (Signed)
 Calm and cooperative. Brother at bedside.    Problem: Clinical Measurements: Goal: Will remain free from infection Outcome: Progressing   Problem: Nutrition: Goal: Adequate nutrition will be maintained Outcome: Progressing   Problem: Skin Integrity: Goal: Risk for impaired skin integrity will decrease Outcome: Progressing   Problem: Safety: Goal: Ability to remain free from injury will improve Outcome: Progressing   Problem: Skin Integrity: Goal: Risk for impaired skin integrity will decrease Outcome: Progressing   Problem: Education: Goal: Knowledge of General Education information will improve Description: Including pain rating scale, medication(s)/side effects and non-pharmacologic comfort measures Outcome: Progressing

## 2024-03-05 NOTE — Plan of Care (Signed)

## 2024-03-05 NOTE — Progress Notes (Signed)
 Modified Barium Swallow Study  Patient Details  Name: Frank Conley MRN: [id number] Date of Birth: Jun 27, 1960  Today's Date: 03/05/2024  Modified Barium Swallow completed.  Full report located under Chart Review in the Imaging Section.  History of Present Illness Frank Conley is a 64 y.o. male presented to the ED with scalp laceration due to unwitnessed fall at group home. , CT was concerning for SDH along the falx and right tentorium and SAH along the right temporal lobe posteriorly without significant mass effect or midline shift. CXR , No acute findings, low lung volumes with mild left basilar atelectasis, right chest cardiac pacing device in place. MBS 03/2023 no aspiration, osteophytes C4-6 , Dys 3/thin rec'd. Noted to have wet vocal quality during OT session. Brother reports frequent pna dx poast several months. PHMx: developmental delay, depression, seizure disorder, SSS s/p PPM, hypertension, CKD 3A, and MGUS, OCD, gait distubance, tremor, left side weakness, macular degeneration, renal cancer pending treatment.   Clinical Impression  Consistent, silent aspiration of all liquid consistencies mostly after the swallow unable to clear despite multiple strategies. Pt not safe for po's however discussing with pt's brother and MD, brother desires pt to continue Dys 2/thin accepting aspiration risk. Education provided with verbal understanding. Pt unable to perform swallow intervention strategies/exercises given cognitive impairments and ST to sign off.  Pt's swallow function has decreased from [facility] 03/2023. Oral phase predominatly functional with posterior spill and slow bolus transport. Although laryngeal elevation, epigottic deflection and laryngeal closure were mildly reduced,  during the swallow airway was compromised however limited to flash or trace penetration. Poor pharyngeal clearance and sensation were primary impairments as he was unable to clear vallecular and pyriform sinus residue  which spilled into airway while pt at rest without sensation. Volitional cough was ineffective and had difficulty initiating subsequent swallow. Residue resulted from premature closure of PES in addition due to inconsistent reduced hyoid excursion, decreased tongue base retraction and inconsistent epiglottic inversion. Use of Provale cup limiting volume, regular cup, teaspoon were unsuccessful. Puree and solid were not aspirated. Pt is not safe for diet/liquids however discussing with pt's brother and MD, brother desires pt to continue Dys 2/thin accepting aspiration risk. Education provided with verbal understanding. Pt unable to perform swallow intervention strategies/exercises given cognitive impairments and ST to sign off.  Factors that may increase risk of adverse event in presence of aspiration Frank Conley 2021): Frequent aspiration of large volumes;Reduced cognitive function  Swallow Evaluation Recommendations Recommendations: NPO (Family desires to continue Dys 2/thin accepting aspiration risk) PO Diet Recommendation:  (Family desires to continue Dys 2/thin accepting aspiration risk) Liquid Administration via:  (see impressions) Medication Administration:  (see impressions) Supervision:  (see impressions) Swallowing strategies  :  (see impressions) Postural changes:  (see impressions) Oral care recommendations: Oral care BID (2x/day)      Frank Conley 03/05/2024,6:38 PM

## 2024-03-05 NOTE — Evaluation (Signed)
 Clinical/Bedside Swallow Evaluation Patient Details  Name: Frank Conley MRN: [id number] Date of Birth: 10-15-1960  Today's Date: 03/05/2024 Time: SLP Start Time (ACUTE ONLY): 0830 SLP Stop Time (ACUTE ONLY): 0850 SLP Time Calculation (min) (ACUTE ONLY): 20 min  Past Medical History:  Past Medical History:  Diagnosis Date   Depression    Hypertension    MGUS (monoclonal gammopathy of unknown significance)    Pervasive developmental disorder    Seizures (HCC)    SSS (sick sinus syndrome) (HCC)    PPM placed   Past Surgical History:  Past Surgical History:  Procedure Laterality Date   PACEMAKER IMPLANT     HPI:  Frank Conley is a 64 y.o. male presented to the ED with scalp laceration due to unwitnessed fall at group home. , CT was concerning for SDH along the falx and right tentorium and SAH along the right temporal lobe posteriorly without significant mass effect or midline shift. CXR , No acute findings, low lung volumes with mild left basilar atelectasis, right chest cardiac pacing device in place. MBS 03/2023 no aspiration, osteophytes C4-6 , Dys 3/thin rec'd. Noted to have wet vocal quality during OT session. Brother reports frequent pna dx poast several months. PHMx: developmental delay, depression, seizure disorder, SSS s/p PPM, hypertension, CKD 3A, and MGUS, OCD, gait distubance, tremor, left side weakness, macular degeneration, renal cancer pending treatment.    Assessment / Plan / Recommendation  Clinical Impression  Indications of aspiration, previous repeated pna and treating pna currently. MBS scheduled today at 12:00  Pt exhibiting indications of pharyngeal dysphagia with immediate cough using Provale cup, throat clear with simulated Dys 2 texture (on minced at baseline). Dentition intact, nor focal oromotor deficits. Intermittent wet vocal quality. Reduced duration of mastication - basline per brother with impulsivity. Has history of aspiration that has been managed  per brother. Now with SDH and repeated pna and currently being treated for pna. Brother in agreement to MBS to further assess. Pt on thick liquids several years ago. SLP Visit Diagnosis: Dysphagia, unspecified (R13.10)    Aspiration Risk  Moderate aspiration risk;Mild aspiration risk    Diet Recommendation           Other Recommendations Oral Care Recommendations: Oral care BID     Swallow Evaluation Recommendations Recommendations: PO diet PO Diet Recommendation: Thin liquids (Level 0) (until MBS) Liquid Administration via: Cup (Provale) Medication Administration:  (TBD) Supervision: Staff to assist with self-feeding;Full supervision/cueing for swallowing strategies Swallowing strategies  : Slow rate;Small bites/sips Postural changes: Position pt fully upright for meals Oral care recommendations: Oral care BID (2x/day)   Assistance Recommended at Discharge    Functional Status Assessment    Frequency and Duration            Prognosis        Swallow Study   General HPI: Frank Conley is a 64 y.o. male presented to the ED with scalp laceration due to unwitnessed fall at group home. , CT was concerning for SDH along the falx and right tentorium and SAH along the right temporal lobe posteriorly without significant mass effect or midline shift. CXR , No acute findings, low lung volumes with mild left basilar atelectasis, right chest cardiac pacing device in place. MBS 03/2023 no aspiration, osteophytes C4-6 , Dys 3/thin rec'd. Noted to have wet vocal quality during OT session. Brother reports frequent pna dx poast several months. PHMx: developmental delay, depression, seizure disorder, SSS s/p PPM, hypertension, CKD 3A, and MGUS,  OCD, gait distubance, tremor, left side weakness, macular degeneration, renal cancer pending treatment. Type of Study: Bedside Swallow Evaluation Previous Swallow Assessment:  (see HPI) Diet Prior to this Study: Thin liquids (Level 0);Dysphagia 2 (finely  chopped) Temperature Spikes Noted: No Respiratory Status: Room air History of Recent Intubation: No Behavior/Cognition: Alert;Cooperative;Pleasant mood Oral Cavity Assessment: Within Functional Limits Oral Care Completed by SLP: No Oral Cavity - Dentition: Adequate natural dentition Vision: Functional for self-feeding Self-Feeding Abilities: Needs assist Patient Positioning: Upright in bed Baseline Vocal Quality: Normal Volitional Cough: Strong    Oral/Motor/Sensory Function Overall Oral Motor/Sensory Function: Within functional limits   Ice Chips Ice chips: Not tested   Thin Liquid Thin Liquid: Impaired Presentation: Cup (Provale cup) Pharyngeal  Phase Impairments: Cough - Immediate;Wet Vocal Quality    Nectar Thick Nectar Thick Liquid: Not tested   Honey Thick Honey Thick Liquid: Not tested   Puree Puree: Within functional limits   Solid     Solid: Impaired (simulated Dys 2) Oral Phase Impairments:  (reduced duration of mastication) Pharyngeal Phase Impairments: Wet Vocal Quality;Throat Clearing - Immediate      Dustin Olam Bull 03/05/2024,9:02 AM

## 2024-03-05 NOTE — Progress Notes (Addendum)
 Frank Conley   DOB:[birth year]   FM#:[id number]      CLINICAL SUMMARY:  Frank Conley is a 64 year old male patient who presented on 03/02/2024 with complaints of scalp laceration.  Patient has medical history significant for developmental delay and lives in a group home.  Oncologic history is significant for MGUS for which he follows primary oncologist at [facility].  Medical oncology at [facility] is following during this admission.   INTERVAL HISTORY:  -Patient admitted on 03/02/2024 with SDH, right temporal SAH. - He has history of seizure disorder. - Developmental Delay/cognitive dysfunction, lives in group home.    ASSESSMENT & PLAN:  History of MGUS - Multiple myeloma panel, protein electrophoresis labs pending. - Low suspicion for multiple myeloma. - Continue follow-up with outpatient oncology at [facility] oncology/Dr. Lanny following for this admission  Acute on chronic anemia Acute on chronic thrombocytopenia Acute on on chronic kidney disease - Chronic anemia likely secondary to CKD - Hemoglobin 7.4. - Recommend PRBC transfusion for hemoglobin <7.0 - Platelets 61K.  Low suspicion for ITP. - Recommend platelet transfusion for counts <50 K due to recent intracranial bleeding.  History of falls SDH/SAH - Patient had scalp laceration status post fall - Imaging done 03/01/2024 showed SDH with SAH.  Also mild posterior scalp hematoma. - Continue falls precautions  Pneumonia, aspiration Sepsis - Status post swallowing eval done today, report pending - Continue antibiotics as ordered - Monitor fever curve  Developmental delay/Cognitive dysfunction History of seizures - Patient is at baseline.  Lives in group home - Patient's brother Marinda is guardian. - Continue supportive care    Code Status DNR-limited  Subjective:  Patient is seen awake and alert sitting up in bed in high Fowlers position.  Patient's brother who is the guardian is at bedside and answered most of the  questions.  Patient defers all answers to questions to his brother.  Just returned from swallowing studies.  Patient is at baseline mental status per his brother.  No acute distress is noted.  Objective:   Intake/Output Summary (Last 24 hours) at 03/05/2024 1426 Last data filed at 03/05/2024 1001 Gross per 24 hour  Intake 343 ml  Output 450 ml  Net -107 ml     PHYSICAL EXAMINATION: ECOG PERFORMANCE STATUS: 4 - Bedbound  Vitals:   03/05/24 0800 03/05/24 1251  BP: (!) 147/77 139/72  Pulse: (!) 57 65  Resp: 18 18  Temp: 98.2 F (36.8 C) 98.4 F (36.9 C)  SpO2: 98% 98%   Filed Weights   03/02/24 0131  Weight: 179 lb 14.3 oz (81.6 kg)    GENERAL: alert, no distress and comfortable SKIN: + Pale skin color, texture, turgor are normal, no rashes or significant lesions + left-sided scalp laceration dry and intact EYES: normal, conjunctiva are pink and non-injected, sclera clear OROPHARYNX: no exudate, no erythema and lips, buccal mucosa, and tongue normal  NECK: supple, thyroid normal size, non-tender, without nodularity LYMPH: no palpable lymphadenopathy in the cervical, axillary or inguinal LUNGS: clear to auscultation and percussion with normal breathing effort HEART: regular rate & rhythm and no murmurs and no lower extremity edema ABDOMEN: abdomen soft, non-tender and normal bowel sounds MUSCULOSKELETAL: no cyanosis of digits and no clubbing  PSYCH: + Altered mentation NEURO: no focal motor/sensory deficits   All questions were answered. The patient knows to call the clinic with any problems, questions or concerns.   I personally spent a total of 40 minutes minutes in the care of the  patient today including preparing to see the patient, getting/reviewing separately obtained history, performing a medically appropriate exam/evaluation, counseling and educating, referring and communicating with other health care professionals, documenting clinical information in the EHR,  communicating results, and coordinating care.    Olam PARAS Rouson, NP 03/05/2024 2:26 PM    Labs Reviewed:  Lab Results  Component Value Date   WBC 3.9 (L) 03/05/2024   HGB 7.4 (L) 03/05/2024   HCT 22.4 (L) 03/05/2024   MCV 96.6 03/05/2024   PLT 61 (L) 03/05/2024   Recent Labs    02/09/24 0431 03/01/24 1821 03/02/24 0421 03/03/24 0207 03/04/24 0655 03/05/24 0123  NA 141 139   < > 141 141 138  K 4.7 5.8*   < > 4.5 4.1 4.5  CL 111 108   < > 112* 112* 107  CO2 22 21*   < > 20* 22 22  GLUCOSE 70 112*   < > 97 78 77  BUN 41* 56*   < > 40* 31* 39*  CREATININE 1.78* 2.28*   < > 2.05* 1.70* 1.89*  CALCIUM  8.8* 8.4*   < > 8.2* 8.6* 8.5*  GFRNONAA 43* 32*   < > 36* 45* 40*  PROT 5.9* 6.1*  --  5.6*  --   --   ALBUMIN 3.3* 3.4*  --  3.1*  --   --   AST 33 52*  --  45*  --   --   ALT 20 32  --  30  --   --   ALKPHOS 113 128*  --  118  --   --   BILITOT 0.3 0.3  --  0.4  --   --   BILIDIR  --  <0.1  --   --   --   --   IBILI  --  NOT CALCULATED  --   --   --   --    < > = values in this interval not displayed.    Studies Reviewed:   CT Head Wo Contrast Result Date: 03/01/2024 EXAM: CT HEAD WITHOUT CONTRAST 03/01/2024 11:27:36 PM TECHNIQUE: CT of the head was performed without the administration of intravenous contrast. Automated exposure control, iterative reconstruction, and/or weight based adjustment of the mA/kV was utilized to reduce the radiation dose to as low as reasonably achievable. COMPARISON: 03/01/2024 CLINICAL HISTORY: Syncope and presyncope, cerebrovascular cause suspected; re-evaluation for intracranial hemorrhage. FINDINGS: BRAIN AND VENTRICLES: Similar right parafalcine subdural hemorrhage measuring up to 8 mm anteriorly. Small volume subarachnoid and subdural hemorrhage along right tentorial leaflet, unchanged. Calcific intracranial atherosclerosis. No evidence of acute infarct. No hydrocephalus. No mass effect or midline shift. ORBITS: Remote left medial orbital wall  fracture. SINUSES: No acute abnormality. SOFT TISSUES AND SKULL: Left mastoid effusion. No acute soft tissue abnormality. No skull fracture. IMPRESSION: 1. Similar right parafalcine subdural hemorrhage measuring up to 8 mm anteriorly and small volume subarachnoid and subdural hemorrhage along the right tentorial leaflet, unchanged. 2. Remote left medial orbital wall fracture. Electronically signed by: Dorethia Molt MD 03/01/2024 11:37 PM EST RP Workstation: [id number]   DG Chest Port 1 View Result Date: 03/01/2024 EXAM: 1 VIEW(S) XRAY OF THE CHEST 03/01/2024 07:09:00 PM COMPARISON: 02/08/2024 CLINICAL HISTORY: Cough. FINDINGS: LINES, TUBES AND DEVICES: Right chest cardiac pacing device in place. LUNGS AND PLEURA: Low lung volumes. Mild left basilar atelectasis. No focal pulmonary opacity. No pleural effusion. No pneumothorax. HEART AND MEDIASTINUM: No acute abnormality of the cardiac and mediastinal silhouettes. BONES AND  SOFT TISSUES: No acute osseous abnormality. IMPRESSION: 1. Low lung volumes with mild left basilar atelectasis. 2. Right chest cardiac pacing device in place. 3. No acute findings. Electronically signed by: Dorethia Molt MD 03/01/2024 07:16 PM EST RP Workstation: [id number]   CT Head Wo Contrast Result Date: 03/01/2024 EXAM: CT HEAD WITHOUT CONTRAST 03/01/2024 06:03:19 PM TECHNIQUE: CT of the head was performed without the administration of intravenous contrast. Automated exposure control, iterative reconstruction, and/or weight based adjustment of the mA/kV was utilized to reduce the radiation dose to as low as reasonably achievable. COMPARISON: 01/11/2024. CLINICAL HISTORY: Fall with posterior head injury, initial encounter. FINDINGS: BRAIN AND VENTRICLES: Subdural hematomas are noted along the falx eccentric to the right, measuring 5 mm in greatest dimension. Additionally, these are noted along the tentorium on the right, measuring up to 4 mm in thickness. Some associated subarachnoid  hemorrhage is noted along the mesial right temporal lobe posteriorly. No other definitive subarachnoid hemorrhage is noted. No evidence of acute infarct. No mass effect or midline shift. ORBITS: No acute abnormality. SINUSES: No acute abnormality. SOFT TISSUES AND SKULL: A mild scalp hematoma is noted in the midline posteriorly, related to the recent injury. No skull fracture. Traumatic Brain Injury Risk Stratification ----- Skull Fracture: No (Low - mBIG 1) Subdural Hematoma (SDH): 5 mm (mBIG 2) Subarachnoid Hemorrhage [facility]): Localized, unilateral (Moderate - mBIG 2) Epidural Hematoma (EDH): No (Low - mBIG 1) Cerebral contusion, intra-axial, intraparenchymal Hemorrhage (IPH): No Intraventricular Hemorrhage (IVH): No (Low - mBIG 1) Midline Shift > 1mm or Edema/effacement of sulci/vents: No (Low - mBIG 1) IMPRESSION: 1. Subdural hematomas along the falx eccentric to the right (5 mm) and along the right tentorium (up to 4 mm), with associated subarachnoid hemorrhage along the mesial right temporal lobe posteriorly, without significant mass effect or midline shift. 2. Mild midline posterior scalp hematoma. 3. TBI Risk Stratification is Moderate - mBIG 2. The findings of this exam were called to Dr. Nicholaus at the time of exam interpretation. Electronically signed by: oneil devonshire MD 03/01/2024 06:12 PM EST RP Workstation: [id number]   CT CHEST WO CONTRAST Result Date: 02/08/2024 EXAM: CT CHEST WITHOUT CONTRAST 02/08/2024 11:32:49 AM TECHNIQUE: CT of the chest was performed without the administration of intravenous contrast. Multiplanar reformatted images are provided for review. Automated exposure control, iterative reconstruction, and/or weight based adjustment of the mA/kV was utilized to reduce the radiation dose to as low as reasonably achievable. COMPARISON: None available. CLINICAL HISTORY: Pneumonia, complication suspected, xray done. FINDINGS: MEDIASTINUM: Cardiomegaly. Trace layering pericardial effusion.  3-vessel coronary artery calcifications. Cardiac changes suggestive of anemia. The central airways are clear. Dilated esophagus. The esophagus appears stable from prior. LYMPH NODES: No gross hilar adenopathy with limited evaluation on this noncontrast study. No mediastinal or axillary lymphadenopathy. LUNGS AND PLEURA: Diffuse bronchial wall thickening with mucous plugging of the left lower lobe. Associated slight mosaic attenuation of the left lower lobe and patchy airspace opacities at the left base (3:106). Stable bilateral calcified micronodules. No pulmonary edema. No pleural effusion or pneumothorax. SOFT TISSUES/BONES: Right chest wall port-a-cath. No acute abnormality of the bones or soft tissues. UPPER ABDOMEN: Partially visualized low-density lesion at the left kidney. Limited images of the upper abdomen demonstrates no acute abnormality. IMPRESSION: 1. Left lower lobe airspace disease consistent with an infectious or inflammatory process. 2. Dilated esophagus, which may predispose to aspiration. Electronically signed by: Morgane Naveau MD 02/08/2024 12:27 PM EST RP Workstation: [id number]   DG Chest Portable 1 View Result Date:  02/08/2024 CLINICAL DATA:  Chest pain EXAM: PORTABLE CHEST 1 VIEW COMPARISON:  December 21, 2023 FINDINGS: Stable cardiomediastinal silhouette. Right-sided pacemaker is unchanged. Right lung is clear. Minimal left basilar subsegmental atelectasis or infiltrate is noted. Bony thorax is unremarkable. IMPRESSION: Minimal left basilar subsegmental atelectasis or infiltrate. Electronically Signed   By: Lynwood Landy Raddle M.D.   On: 02/08/2024 10:55    Addendum I have seen the patient, examined him. I agree with the assessment and and plan and have edited the notes.   Lab reviewed, anemia and thrombocytopenia overall stable, not required blood transfusion.  Immature platelet count is low, which does not support ITP.  Serum light chain level are both elevated, overall stable.  SPEP  with immunofixation is still pending.  Will continue to monitor his blood counts, will f/u early next week if he is still in hospital.   Onita Mattock MD 03/05/2024

## 2024-03-05 NOTE — TOC Progression Note (Addendum)
 Transition of Care [facility]) - Progression Note    Patient Details  Name: Frank Conley MRN: [id number] Date of Birth: Jun 29, 1960  Transition of [facility]) CM/SW Contact  Almarie CHRISTELLA [place]ie, [place] Phone Number: 03/05/2024, 10:15 AM  Clinical Narrative:   CSW received call back from [facility] with fax number, CSW sent fax referral for review. Awaiting response. CSW to follow.  UPDATE: CSW received call from [facility] that they are reviewing, should be able to update CSW tomorrow with a decision. No bed available until Friday. CSW to follow.    Expected Discharge Plan: IP Rehab Facility Barriers to Discharge: Continued Medical Work up               Expected Discharge Plan and Services     Post Acute Care Choice: IP Rehab Living arrangements for the past 2 months: Group Home                                       Social Drivers of Health (SDOH) Interventions SDOH Screenings   Food Insecurity: Patient Unable To Answer (03/03/2024)  Housing: Low Risk (03/03/2024)  Transportation Needs: Patient Unable To Answer (03/03/2024)  Utilities: Patient Unable To Answer (03/03/2024)  Financial Resource Strain: Low Risk (01/17/2024)   Received from [facility]  Tobacco Use: Low Risk (03/02/2024)    Readmission Risk Interventions     No data to display

## 2024-03-05 NOTE — Progress Notes (Addendum)
 Pt is off the unit for MBS.

## 2024-03-06 DIAGNOSIS — R1312 Dysphagia, oropharyngeal phase: Secondary | ICD-10-CM

## 2024-03-06 DIAGNOSIS — S065XAA Traumatic subdural hemorrhage with loss of consciousness status unknown, initial encounter: Secondary | ICD-10-CM | POA: Diagnosis not present

## 2024-03-06 LAB — CULTURE, BLOOD (ROUTINE X 2)
Culture: NO GROWTH
Culture: NO GROWTH

## 2024-03-06 LAB — GLUCOSE, CAPILLARY
Glucose-Capillary: 134 mg/dL — ABNORMAL HIGH (ref 70–99)
Glucose-Capillary: 91 mg/dL (ref 70–99)
Glucose-Capillary: 92 mg/dL (ref 70–99)
Glucose-Capillary: 94 mg/dL (ref 70–99)

## 2024-03-06 NOTE — Progress Notes (Signed)
 " Progress Note   [name] FMW:[id number] DOB: 1960-11-06 DOA: 03/02/2024     3   Brief hospital course: 64 y.o. M with developmental delay, lives in group home, renal cancer, pending treatment, MGUS follows at [facility], hx seizures, dCHF, SSS s/p PPM, CKD IIIa baseline 1.5, HTN, traumatic SDH last Nov 2025, and TIA who presented to [facility] with unwitnessed fall.  In the ER, mentation at baseline but CT showed large SDH and small SAH.   Neurosurgery recommended no intervention but due to location of bleed, high risk for seizure, so transferred to [facility] for Neurology evaluation.   Assessment and Plan:  Right parafalcine SDH Right temporal SAH Had small right supratentorial SDH last fall.  On follow-up in December, repeat CT showed this had resolved.  Gait has been unstable since then, and patient fell at time of admission. CT here showed larger SDH now, same position, but no mass effect, small SAH along right temporal lobe.   Discussed with neurosurgery, repeat CT head stable, no intervention required.  This appears to be stable Transfuse platelets if less than 50K Outpatient follow up with [facility] [place]. Neurological status is stable.  Oropharyngeal dysphagia Patient evaluated by speech therapy.  Underwent modified barium Swallow.  This revealed very high risk of aspiration.  Discussed in detail with patient's brother who is also his legal guardian.  He understands the risk.  Wants to continue with dysphagia diet along with thin liquids.  We did discuss feeding tube and he and his wife agree that patient would not want to have a feeding tube.  Palliative care consulted for goals of care conversation.   Suspected pneumonia sepsis Hypothermia and hypoglycemia likely due to sepsis At admission, had hypothermia and hypoglycemia without overt signs of infection.  Started on empiric antibiotics.  UA clear, blood cultures no growth, but rales on exam in bases and CXR with basilar  opacities.  Cortisol 11, relatively normal.  TSH normal.  No liver disease.    Patient was treated with antibiotics.  Has completed course.   See above regarding aspiration risk.   Thrombocytopenia Anemia Pancytopenia MGUS Old Heme-Onc notes from [facility] from 2017 and 2025 reviewed. Had anemia and thrombocytopenia in that time period that was judged to be medication related from Lamictal  and Seroquel  (SPEP showed moderate spike only).  Those medications were continued though, and he seems not to have had any problems until now (SPEP in 2024 stable, SPEP in 2025 stable, FLCs elevated due to renal failure) Review of Hgb shows this was trending down to 9 g/dL during 7974.  Acutely down to 7.5 on admission Platelets were ~140s, until this month (down to 80K Jan 3, down to 50K now) Discussed with Neurology and Hematology.  B12, folate, smear, and ferritin unremarkable. After discussions with hematology Lamictal  was discontinued.  He was started on Vimpat . Hematology does not have a suspicion for multiple myeloma.  They plan to follow-up as needed. Counts are low but stable.  Check labs every other day.  AKI on CKD IIIa Hyperkalemia Cr 1.5 during last year.  After admission in early Jan discharge Cr was 1.7, improved to 1.36 at hospital follow up Cr 2.3 on admission.  Improved with hydration.  Avoid nephrotoxic agents.  Recheck labs in the morning.   Seizure disorder Unclear if he had an unwitnessed seizure prior to admission. No seizures here Lamictal  has been changed over to Vimpat  due to pancytopenia. Continue clonazepam    Mood disorder Lamictal  was  for mood/aggression in addition to seizures.  Discussed with family, they are in agreement with change to Vimpat  for now, will follow up with Psychiatry in [facility] after discharge.  Developmental delay OCD Continue home clonazepam , Seroquel   SSS s/p PPM Not on rate control medication at baseline Holding clonidine .  Watch for rebound  hypertension.   Essential hypertension BP normal off medication Holding clonidine  until hemodynamics clearer Blood pressure has been reasonably well-controlled.   Head laceration Sutures will need to be removed ~2/4  DVT prophylaxis: SCDs CODE STATUS: DNR Family communication: Discussed with brother at bedside Disposition: Inpatient rehab versus return to group home.  TOC is following  Subjective: Patient lying on the bed.  Does not appear to be in any discomfort or distress.  Does not answer questions appropriately.  His brother is at the bedside.   Physical Exam: BP 126/72 (BP Location: Right Arm)   Pulse 69   Temp 99.7 F (37.6 C) (Oral)   Resp 17   Ht 5' 8 (1.727 m)   Wt 81.6 kg   SpO2 95%   BMI 27.35 kg/m    General appearance: Awake alert.  In no distress Resp: Normal effort at rest.  Few crackles at the bases.  No wheezing or rhonchi. Cardio: S1-S2 is normal regular.  No S3-S4.  No rubs murmurs or bruit GI: Abdomen is soft.  Nontender nondistended.  Bowel sounds are present normal.  No masses organomegaly Extremities: No edema.  Noted to be moving his extremities  Data Reviewed:     Latest Ref Rng & Units 03/05/2024    1:23 AM 03/04/2024    6:55 AM 03/03/2024    2:07 AM  CBC  WBC 4.0 - 10.5 K/uL 3.9  3.4  5.5   Hemoglobin 13.0 - 17.0 g/dL 7.4  7.4  7.6   Hematocrit 39.0 - 52.0 % 22.4  22.7  24.0   Platelets 150 - 400 K/uL 61  59  52       Latest Ref Rng & Units 03/05/2024    1:23 AM 03/04/2024    6:55 AM 03/03/2024    2:07 AM  BMP  Glucose 70 - 99 mg/dL 77  78  97   BUN 8 - 23 mg/dL 39  31  40   Creatinine 0.61 - 1.24 mg/dL 8.10  8.29  7.94   Sodium 135 - 145 mmol/L 138  141  141   Potassium 3.5 - 5.1 mmol/L 4.5  4.1  4.5   Chloride 98 - 111 mmol/L 107  112  112   CO2 22 - 32 mmol/L 22  22  20    Calcium  8.9 - 10.3 mg/dL 8.5  8.6  8.2     Joette Pebbles, MD 03/06/2024 9:17 AM  For on call review [email or web address].    "

## 2024-03-06 NOTE — Plan of Care (Signed)

## 2024-03-06 NOTE — Care Management Important Message (Signed)
 Important Message  Patient Details  Name: Frank Conley MRN: [id number] Date of Birth: October 18, 1960   Important Message Given:  Yes - Medicare IM     Claretta Deed 03/06/2024, 3:20 PM

## 2024-03-06 NOTE — Progress Notes (Signed)
 Physical Therapy Treatment Patient Details Name: Frank Conley MRN: [id number] DOB: [birth year] Today's Date: 03/06/2024   History of Present Illness Frank Conley is a 64 y.o. male presented to the ED with scalp laceration due to unwitnessed fall at group home. Found to be hypothermic, CT was concerning for SDH along the falx and right tentorium and SAH along the right temporal lobe posteriorly without significant mass effect or midline shift. PHMx: developmental delay, depression, seizure disorder, SSS s/p PPM, hypertension, CKD 3A, and MGUS, OCD, gait distubance, tremor, left side weakness, macular degeneration, renal cancer pending treatment.    PT Comments  Pt greeted supine in bed, pleasant and agreeable to PT session. He increased gait distance, engaging in three bouts of ~19ft using RW with a close chair follow. Pt continues to require modA for safety, stability, and AD management. He had 1 LOB to the right and was unsteady d/t forward lean with AD positioned too far ahead of him. Physical assist to maintain appropriate proximity. Multi-modal cues to improve sequencing and technique. Pt verbalized understanding and was able to repeat back cues in his own words, but unable to effectively incorporate corrections into his mobility. Will continue to follow acutely and advance appropriately.     If plan is discharge home, recommend the following: Two people to help with walking and/or transfers;A lot of help with bathing/dressing/bathroom;Assistance with cooking/housework;Direct supervision/assist for medications management;Direct supervision/assist for financial management;Assist for transportation;Supervision due to cognitive status;Help with stairs or ramp for entrance   Can travel by private vehicle        Equipment Recommendations  None recommended by PT    Recommendations for Other Services       Precautions / Restrictions Precautions Precautions: Fall Recall of  Precautions/Restrictions: Impaired Restrictions Weight Bearing Restrictions Per Provider Order: No     Mobility  Bed Mobility Overal bed mobility: Needs Assistance Bed Mobility: Supine to Sit     Supine to sit: Min assist     General bed mobility comments: Pt sat up on L side of bed with increased time and cues for sequencing. Pt slowly brought BLE off EOB. HHA to aid in trunk elevation.    Transfers Overall transfer level: Needs assistance Equipment used: Rolling walker (2 wheels) Transfers: Sit to/from Stand Sit to Stand: Min assist           General transfer comment: Pt stood from lowest bed height and recliner chair. Cued proper hand placement using RW. He required prompting for hand positioning each time prior to transferring. Powered up with minA. Cues to back up until BLE felt the surface he was going to and reaching back with one hand to arm rest. Pt often quickly sat not letting go of AD.    Ambulation/Gait Ambulation/Gait assistance: Mod assist, +2 safety/equipment (Chair Follow) Gait Distance (Feet): 50 Feet (x3, seated rest break between bouts) Assistive device: Rolling walker (2 wheels) Gait Pattern/deviations: Trunk flexed, Drifts right/left, Decreased stride length, Step-through pattern Gait velocity: decreased Gait velocity interpretation: <1.31 ft/sec, indicative of household ambulator   General Gait Details: Pt ambulated with a step-through gait pattern, even weight shift, and fair foot clearance. He maintains a fwd lean pushing AD too far in front of him. Cues for upright posture and closer proximity to AD. ModA to aid in management of RW. Pt had 1 LOB to the right requiring assist to stabilize. He took seated rest breaks between each bout. Re-emphasized correct technique/sequencing with multi-modal cues and demonstration. Pt able  to repeat back instruction, but had difficulty carrying out or adjusting form.   Stairs             Wheelchair  Mobility     Tilt Bed    Modified Rankin (Stroke Patients Only)       Balance Overall balance assessment: Needs assistance Sitting-balance support: Feet supported, Single extremity supported Sitting balance-Leahy Scale: Fair Sitting balance - Comments: Pt sat EOB with CGA.   Standing balance support: Bilateral upper extremity supported, During functional activity, Reliant on assistive device for balance Standing balance-Leahy Scale: Poor Standing balance comment: Pt dependent on RW and min-modA, +2 during gait.                            Communication Communication Communication: No apparent difficulties  Cognition Arousal: Alert Behavior During Therapy: WFL for tasks assessed/performed   PT - Cognitive impairments: History of cognitive impairments (Developmental delay at baseline.)                         Following commands: Impaired      Cueing Cueing Techniques: Verbal cues, Tactile cues, Gestural cues  Exercises      General Comments General comments (skin integrity, edema, etc.): VSS on RA. Brother present and supportive towards end of session.      Pertinent Vitals/Pain Pain Assessment Pain Assessment: No/denies pain    Home Living                          Prior Function            PT Goals (current goals can now be found in the care plan section) Acute Rehab PT Goals Patient Stated Goal: Get better PT Goal Formulation: With patient Time For Goal Achievement: 03/18/24 Potential to Achieve Goals: Fair Progress towards PT goals: Progressing toward goals    Frequency    Min 2X/week      PT Plan      Co-evaluation              AM-PAC PT 6 Clicks Mobility   Outcome Measure  Help needed turning from your back to your side while in a flat bed without using bedrails?: A Little Help needed moving from lying on your back to sitting on the side of a flat bed without using bedrails?: A Little Help needed  moving to and from a bed to a chair (including a wheelchair)?: A Lot Help needed standing up from a chair using your arms (e.g., wheelchair or bedside chair)?: A Little Help needed to walk in hospital room?: A Lot Help needed climbing 3-5 steps with a railing? : Total 6 Click Score: 14    End of Session Equipment Utilized During Treatment: Gait belt Activity Tolerance: Patient tolerated treatment well Patient left: in chair;with call bell/phone within reach;with chair alarm set;with family/visitor present Nurse Communication: Mobility status PT Visit Diagnosis: Other abnormalities of gait and mobility (R26.89)     Time: 8474-8447 PT Time Calculation (min) (ACUTE ONLY): 27 min  Charges:    $Gait Training: 8-22 mins $Therapeutic Activity: 8-22 mins PT General Charges $$ ACUTE PT VISIT: 1 Visit                     Randall SAUNDERS, PT, DPT Acute Rehabilitation Services Office: [phone] Secure Chat Preferred  Frank Conley 03/06/2024, 4:48 PM

## 2024-03-06 NOTE — TOC Progression Note (Signed)
 Transition of Care [facility]) - Progression Note    Patient Details  Name: Frank Conley MRN: [id number] Date of Birth: December 17, 1960  [facility] CM/SW Contact  Frank Conley, Frank Conley Phone Number: 03/06/2024, 2:19 PM  Clinical Narrative:   CSW updated by [facility] that they can accept patient, bed available tomorrow. CSW met with brother, Frank Conley, at bedside and he is in agreement. MD updated. CSW to follow.    Expected Discharge Plan: IP Rehab Facility Barriers to Discharge: Continued Medical Work up               Expected Discharge Plan and Services     Post Acute Care Choice: IP Rehab Living arrangements for the past 2 months: Group Home                                       Social Drivers of Health (SDOH) Interventions SDOH Screenings   Food Insecurity: Patient Unable To Answer (03/03/2024)  Housing: Low Risk (03/03/2024)  Transportation Needs: Patient Unable To Answer (03/03/2024)  Utilities: Patient Unable To Answer (03/03/2024)  Financial Resource Strain: Low Risk (01/17/2024)   Received from [facility]  Tobacco Use: Low Risk (03/02/2024)    Readmission Risk Interventions     No data to display

## 2024-03-06 NOTE — Plan of Care (Signed)
   Problem: Clinical Measurements: Goal: Ability to maintain clinical measurements within normal limits will improve Outcome: Progressing   Problem: Activity: Goal: Risk for activity intolerance will decrease Outcome: Progressing   Problem: Nutrition: Goal: Adequate nutrition will be maintained Outcome: Progressing

## 2024-03-07 DIAGNOSIS — S065XAA Traumatic subdural hemorrhage with loss of consciousness status unknown, initial encounter: Secondary | ICD-10-CM | POA: Diagnosis not present

## 2024-03-07 LAB — MAGNESIUM: Magnesium: 2.3 mg/dL (ref 1.7–2.4)

## 2024-03-07 LAB — BASIC METABOLIC PANEL WITH GFR
Anion gap: 11 (ref 5–15)
BUN: 43 mg/dL — ABNORMAL HIGH (ref 8–23)
CO2: 22 mmol/L (ref 22–32)
Calcium: 8.7 mg/dL — ABNORMAL LOW (ref 8.9–10.3)
Chloride: 108 mmol/L (ref 98–111)
Creatinine, Ser: 1.68 mg/dL — ABNORMAL HIGH (ref 0.61–1.24)
GFR, Estimated: 46 mL/min — ABNORMAL LOW
Glucose, Bld: 94 mg/dL (ref 70–99)
Potassium: 4 mmol/L (ref 3.5–5.1)
Sodium: 141 mmol/L (ref 135–145)

## 2024-03-07 LAB — GLUCOSE, CAPILLARY
Glucose-Capillary: 100 mg/dL — ABNORMAL HIGH (ref 70–99)
Glucose-Capillary: 88 mg/dL (ref 70–99)
Glucose-Capillary: 113 mg/dL — ABNORMAL HIGH (ref 70–99)

## 2024-03-07 LAB — CBC WITH DIFFERENTIAL/PLATELET
Abs Immature Granulocytes: 0.02 10*3/uL (ref 0.00–0.07)
Basophils Absolute: 0 10*3/uL (ref 0.0–0.1)
Basophils Relative: 0 %
Eosinophils Absolute: 0.1 10*3/uL (ref 0.0–0.5)
Eosinophils Relative: 1 %
HCT: 22.8 % — ABNORMAL LOW (ref 39.0–52.0)
Hemoglobin: 7.6 g/dL — ABNORMAL LOW (ref 13.0–17.0)
Immature Granulocytes: 0 %
Lymphocytes Relative: 11 %
Lymphs Abs: 0.7 10*3/uL (ref 0.7–4.0)
MCH: 32.1 pg (ref 26.0–34.0)
MCHC: 33.3 g/dL (ref 30.0–36.0)
MCV: 96.2 fL (ref 80.0–100.0)
Monocytes Absolute: 0.6 10*3/uL (ref 0.1–1.0)
Monocytes Relative: 9 %
Neutro Abs: 5.3 10*3/uL (ref 1.7–7.7)
Neutrophils Relative %: 79 %
Platelets: 89 10*3/uL — ABNORMAL LOW (ref 150–400)
RBC: 2.37 MIL/uL — ABNORMAL LOW (ref 4.22–5.81)
RDW: 17.7 % — ABNORMAL HIGH (ref 11.5–15.5)
Smear Review: NORMAL
WBC: 6.7 10*3/uL (ref 4.0–10.5)
nRBC: 0 % (ref 0.0–0.2)

## 2024-03-07 LAB — PHOSPHORUS: Phosphorus: 3.7 mg/dL (ref 2.5–4.6)

## 2024-03-07 NOTE — TOC Transition Note (Signed)
 Transition of Care [facility]) - Discharge Note   Patient Details  Name: Frank Conley MRN: [id number] Date of Birth: 1960-08-05  Transition of Care [facility]) CM/SW Contact:  Almarie CHRISTELLA Goodie, LCSW Phone Number: 03/07/2024, 10:28 AM   Clinical Narrative:   Patient medically ready for discharge, [facility] has a bed available today. Brother, Marinda, at bedside in agreement and will transport. Bed will be available after 1 pm. No further inpatient care management needs at this time.  Nurse to call report to [phone], Room [phone]    Final next level of care: IP Rehab Facility Barriers to Discharge: Barriers Resolved   Patient Goals and CMS Choice Patient states their goals for this hospitalization and ongoing recovery are:: has legal guardian CMS Medicare.gov Compare Post Acute Care list provided to:: Patient Represenative (must comment) Choice offered to / list presented to : [facility] POA / Guardian [place] ownership interest in [facility].provided to:: [facility] POA / Guardian    Discharge Placement              Patient chooses bed at:  [facility]) Patient to be transferred to facility by: Brother Name of family member notified: Marinda Patient and family notified of of transfer: 03/07/24  Discharge Plan and Services Additional resources added to the After Visit Summary for       Post Acute Care Choice: IP Rehab                               Social Drivers of Health (SDOH) Interventions SDOH Screenings   Food Insecurity: Patient Unable To Answer (03/03/2024)  Housing: Low Risk (03/03/2024)  Transportation Needs: Patient Unable To Answer (03/03/2024)  Utilities: Patient Unable To Answer (03/03/2024)  Financial Resource Strain: Low Risk (01/17/2024)   Received from [facility]  Tobacco Use: Low Risk (03/02/2024)     Readmission Risk Interventions     No data to display

## 2024-03-07 NOTE — Progress Notes (Incomplete)
 "                                                                                                                                                                                                         Palliative Medicine Progress Note   [name]       Date: 03/07/2024 DOB: [birth year]  Age: 64 y.o. MRN#: [id number] Attending Physician: No att. providers found Primary Care Physician: Lizette Lango, MD Admit Date: 03/02/2024  Reason for Consultation/Follow-up: {Reason for Consult:23484}  HPI/Patient Profile: 64 y.o. male  with past medical history of developmental disorder, lives in group home, renal cancer, MGUS followed at [facility], history of seizures, chronic CHF, SSS s/p PPM, CKD 3A, traumatic SDH in November 2025, and TIA who presented to [facility] ED on 03/02/2024 with unwitnessed fall.  In the ED, CT showed large SDH and small SAH.  Neurosurgery recommended no intervention but due to location of bleed, patient high risk for seizures so transferred to [facility] for neurology evaluation.   Patient also with suspected pneumonia. On 1/28, MBSS showed very high risk for aspiration.    Palliative Medicine has been consulted for goals of care discussions and complex medical decision making.     Subjective: ***  Objective:  Physical Exam          Vital Signs: BP 120/73 (BP Location: Left Arm)   Pulse 65   Temp 98.4 F (36.9 C) (Oral)   Resp 17   Ht 5' 8 (1.727 m)   Wt 81.6 kg   SpO2 96%   BMI 27.35 kg/m  SpO2: SpO2: 96 % O2 Device: O2 Device: Room Air O2 Flow Rate:    Intake/output summary:  Intake/Output Summary (Last 24 hours) at 03/07/2024 1615 Last data filed at 03/07/2024 0500 Gross per 24 hour  Intake --  Output 800 ml  Net -800 ml    LBM: Last BM Date : 03/06/24     Palliative Assessment/Data: ***     Palliative Medicine Assessment & Plan   Assessment: Principal Problem:   TBI (traumatic brain injury) (HCC) Active Problems:   Seizure disorder (HCC)    Essential hypertension   Acute kidney injury superimposed on CKD   Developmental delay disorder   Major depressive disorder, single episode, severe, with psychotic behavior (HCC)   MGUS (monoclonal gammopathy of unknown significance)   Thrombocytopenia   Renal mass   Subdural hematoma (HCC)   AKI (acute kidney injury)    Recommendations/Plan: ***  Goals of Care and Additional Recommendations: Limitations on Scope of Treatment: {  Recommended Scope and Preferences:21019}  Code Status:   Prognosis:  {Palliative Care Prognosis:23504}  Discharge Planning: {Palliative dispostion:23505}  Care plan was discussed with ***  Thank you for allowing the Palliative Medicine Team to assist in the care of this patient.   ***   Recardo KATHEE Loll, NP   Please contact Palliative Medicine Team phone at [phone] for questions and concerns.  For individual providers, please see AMION.      "
